# Patient Record
Sex: Female | Born: 1950 | Race: Black or African American | Hispanic: No | Marital: Single | Smoking: Never smoker
Health system: Southern US, Community
[De-identification: ages and names within clinical notes are randomized; demographics above are authoritative.]

## PROBLEM LIST (undated history)

## (undated) DIAGNOSIS — K5090 Crohn's disease, unspecified, without complications: Secondary | ICD-10-CM

## (undated) DIAGNOSIS — K648 Other hemorrhoids: Secondary | ICD-10-CM

## (undated) DIAGNOSIS — I10 Essential (primary) hypertension: Secondary | ICD-10-CM

## (undated) DIAGNOSIS — K210 Gastro-esophageal reflux disease with esophagitis: Secondary | ICD-10-CM

## (undated) DIAGNOSIS — R011 Cardiac murmur, unspecified: Secondary | ICD-10-CM

## (undated) DIAGNOSIS — D126 Benign neoplasm of colon, unspecified: Secondary | ICD-10-CM

## (undated) DIAGNOSIS — G43909 Migraine, unspecified, not intractable, without status migrainosus: Secondary | ICD-10-CM

## (undated) DIAGNOSIS — D649 Anemia, unspecified: Secondary | ICD-10-CM

## (undated) DIAGNOSIS — D259 Leiomyoma of uterus, unspecified: Secondary | ICD-10-CM

## (undated) DIAGNOSIS — IMO0002 Reserved for concepts with insufficient information to code with codable children: Secondary | ICD-10-CM

## (undated) DIAGNOSIS — K635 Polyp of colon: Secondary | ICD-10-CM

## (undated) DIAGNOSIS — K621 Rectal polyp: Secondary | ICD-10-CM

## (undated) DIAGNOSIS — K219 Gastro-esophageal reflux disease without esophagitis: Secondary | ICD-10-CM

## (undated) DIAGNOSIS — E049 Nontoxic goiter, unspecified: Secondary | ICD-10-CM

## (undated) DIAGNOSIS — K222 Esophageal obstruction: Secondary | ICD-10-CM

## (undated) DIAGNOSIS — K449 Diaphragmatic hernia without obstruction or gangrene: Secondary | ICD-10-CM

## (undated) DIAGNOSIS — T7840XA Allergy, unspecified, initial encounter: Secondary | ICD-10-CM

## (undated) DIAGNOSIS — K2980 Duodenitis without bleeding: Secondary | ICD-10-CM

## (undated) HISTORY — DX: Gastro-esophageal reflux disease with esophagitis: K21.0

## (undated) HISTORY — DX: Anemia, unspecified: D64.9

## (undated) HISTORY — PX: UPPER GASTROINTESTINAL ENDOSCOPY: SHX188

## (undated) HISTORY — PX: CATARACT EXTRACTION: SUR2

## (undated) HISTORY — PX: OVARIAN CYST REMOVAL: SHX89

## (undated) HISTORY — DX: Reserved for concepts with insufficient information to code with codable children: IMO0002

## (undated) HISTORY — DX: Diaphragmatic hernia without obstruction or gangrene: K44.9

## (undated) HISTORY — DX: Essential (primary) hypertension: I10

## (undated) HISTORY — DX: Crohn's disease, unspecified, without complications: K50.90

## (undated) HISTORY — DX: Migraine, unspecified, not intractable, without status migrainosus: G43.909

## (undated) HISTORY — DX: Allergy, unspecified, initial encounter: T78.40XA

## (undated) HISTORY — PX: COLONOSCOPY: SHX174

## (undated) HISTORY — DX: Leiomyoma of uterus, unspecified: D25.9

## (undated) HISTORY — DX: Cardiac murmur, unspecified: R01.1

## (undated) HISTORY — DX: Nontoxic goiter, unspecified: E04.9

## (undated) HISTORY — DX: Other hemorrhoids: K64.8

## (undated) HISTORY — PX: HEMORRHOID SURGERY: SHX153

## (undated) HISTORY — DX: Rectal polyp: K62.1

## (undated) HISTORY — DX: Duodenitis without bleeding: K29.80

## (undated) HISTORY — DX: Polyp of colon: K63.5

## (undated) HISTORY — DX: Gastro-esophageal reflux disease without esophagitis: K21.9

## (undated) HISTORY — DX: Benign neoplasm of colon, unspecified: D12.6

## (undated) HISTORY — PX: POLYPECTOMY: SHX149

## (undated) HISTORY — DX: Esophageal obstruction: K22.2

---

## 1998-06-17 ENCOUNTER — Encounter: Payer: Self-pay | Admitting: Internal Medicine

## 1998-06-17 ENCOUNTER — Ambulatory Visit (HOSPITAL_COMMUNITY): Admission: RE | Admit: 1998-06-17 | Discharge: 1998-06-17 | Payer: Self-pay | Admitting: Internal Medicine

## 1998-07-21 ENCOUNTER — Other Ambulatory Visit: Admission: RE | Admit: 1998-07-21 | Discharge: 1998-07-21 | Payer: Self-pay | Admitting: Obstetrics and Gynecology

## 1999-05-07 ENCOUNTER — Other Ambulatory Visit: Admission: RE | Admit: 1999-05-07 | Discharge: 1999-05-07 | Payer: Self-pay | Admitting: Obstetrics and Gynecology

## 1999-11-10 ENCOUNTER — Other Ambulatory Visit: Admission: RE | Admit: 1999-11-10 | Discharge: 1999-11-10 | Payer: Self-pay | Admitting: Obstetrics and Gynecology

## 2000-04-27 ENCOUNTER — Other Ambulatory Visit: Admission: RE | Admit: 2000-04-27 | Discharge: 2000-04-27 | Payer: Self-pay | Admitting: Obstetrics and Gynecology

## 2000-05-17 DIAGNOSIS — K449 Diaphragmatic hernia without obstruction or gangrene: Secondary | ICD-10-CM

## 2000-05-17 HISTORY — DX: Diaphragmatic hernia without obstruction or gangrene: K44.9

## 2000-10-31 ENCOUNTER — Other Ambulatory Visit: Admission: RE | Admit: 2000-10-31 | Discharge: 2000-10-31 | Payer: Self-pay | Admitting: Obstetrics and Gynecology

## 2001-03-01 ENCOUNTER — Encounter (INDEPENDENT_AMBULATORY_CARE_PROVIDER_SITE_OTHER): Payer: Self-pay | Admitting: Specialist

## 2001-03-01 ENCOUNTER — Other Ambulatory Visit: Admission: RE | Admit: 2001-03-01 | Discharge: 2001-03-01 | Payer: Self-pay | Admitting: Gastroenterology

## 2001-10-17 ENCOUNTER — Other Ambulatory Visit: Admission: RE | Admit: 2001-10-17 | Discharge: 2001-10-17 | Payer: Self-pay | Admitting: Obstetrics and Gynecology

## 2002-10-25 ENCOUNTER — Other Ambulatory Visit: Admission: RE | Admit: 2002-10-25 | Discharge: 2002-10-25 | Payer: Self-pay | Admitting: Obstetrics and Gynecology

## 2003-06-18 ENCOUNTER — Ambulatory Visit (HOSPITAL_COMMUNITY): Admission: RE | Admit: 2003-06-18 | Discharge: 2003-06-18 | Payer: Self-pay | Admitting: Endocrinology

## 2003-07-02 ENCOUNTER — Encounter (INDEPENDENT_AMBULATORY_CARE_PROVIDER_SITE_OTHER): Payer: Self-pay | Admitting: Specialist

## 2003-07-02 ENCOUNTER — Ambulatory Visit (HOSPITAL_COMMUNITY): Admission: RE | Admit: 2003-07-02 | Discharge: 2003-07-02 | Payer: Self-pay | Admitting: Endocrinology

## 2003-11-07 ENCOUNTER — Other Ambulatory Visit: Admission: RE | Admit: 2003-11-07 | Discharge: 2003-11-07 | Payer: Self-pay | Admitting: Obstetrics and Gynecology

## 2004-05-20 ENCOUNTER — Ambulatory Visit: Payer: Self-pay | Admitting: Internal Medicine

## 2004-05-28 ENCOUNTER — Ambulatory Visit: Payer: Self-pay | Admitting: Internal Medicine

## 2004-06-30 ENCOUNTER — Ambulatory Visit (HOSPITAL_COMMUNITY): Admission: RE | Admit: 2004-06-30 | Discharge: 2004-06-30 | Payer: Self-pay | Admitting: General Surgery

## 2004-08-05 ENCOUNTER — Ambulatory Visit (HOSPITAL_COMMUNITY): Admission: RE | Admit: 2004-08-05 | Discharge: 2004-08-05 | Payer: Self-pay | Admitting: Obstetrics and Gynecology

## 2004-08-05 ENCOUNTER — Encounter (INDEPENDENT_AMBULATORY_CARE_PROVIDER_SITE_OTHER): Payer: Self-pay | Admitting: *Deleted

## 2004-08-05 HISTORY — PX: OPERATIVE HYSTEROSCOPY: SUR664

## 2004-08-07 ENCOUNTER — Ambulatory Visit: Payer: Self-pay | Admitting: Internal Medicine

## 2004-10-30 ENCOUNTER — Ambulatory Visit: Payer: Self-pay | Admitting: Gastroenterology

## 2005-04-29 ENCOUNTER — Ambulatory Visit: Payer: Self-pay | Admitting: Internal Medicine

## 2005-05-17 DIAGNOSIS — K2980 Duodenitis without bleeding: Secondary | ICD-10-CM

## 2005-05-17 DIAGNOSIS — K2100 Gastro-esophageal reflux disease with esophagitis, without bleeding: Secondary | ICD-10-CM

## 2005-05-17 DIAGNOSIS — K222 Esophageal obstruction: Secondary | ICD-10-CM

## 2005-05-17 HISTORY — DX: Duodenitis without bleeding: K29.80

## 2005-05-17 HISTORY — DX: Esophageal obstruction: K22.2

## 2005-05-17 HISTORY — DX: Gastro-esophageal reflux disease with esophagitis, without bleeding: K21.00

## 2005-07-01 ENCOUNTER — Encounter: Admission: RE | Admit: 2005-07-01 | Discharge: 2005-07-01 | Payer: Self-pay | Admitting: General Surgery

## 2005-07-21 ENCOUNTER — Ambulatory Visit: Payer: Self-pay | Admitting: Internal Medicine

## 2005-07-24 ENCOUNTER — Ambulatory Visit: Payer: Self-pay | Admitting: Family Medicine

## 2005-09-06 ENCOUNTER — Ambulatory Visit: Payer: Self-pay | Admitting: Internal Medicine

## 2005-09-08 ENCOUNTER — Encounter: Admission: RE | Admit: 2005-09-08 | Discharge: 2005-09-08 | Payer: Self-pay | Admitting: Internal Medicine

## 2005-09-21 ENCOUNTER — Encounter: Admission: RE | Admit: 2005-09-21 | Discharge: 2005-10-15 | Payer: Self-pay | Admitting: Internal Medicine

## 2005-12-14 ENCOUNTER — Ambulatory Visit: Payer: Self-pay | Admitting: Internal Medicine

## 2006-01-12 ENCOUNTER — Ambulatory Visit: Payer: Self-pay | Admitting: Internal Medicine

## 2006-02-03 ENCOUNTER — Ambulatory Visit: Payer: Self-pay | Admitting: Gastroenterology

## 2006-02-16 ENCOUNTER — Ambulatory Visit: Payer: Self-pay | Admitting: Internal Medicine

## 2006-03-04 ENCOUNTER — Ambulatory Visit: Payer: Self-pay | Admitting: Internal Medicine

## 2006-03-09 ENCOUNTER — Encounter (INDEPENDENT_AMBULATORY_CARE_PROVIDER_SITE_OTHER): Payer: Self-pay | Admitting: *Deleted

## 2006-03-09 ENCOUNTER — Ambulatory Visit: Payer: Self-pay | Admitting: Gastroenterology

## 2006-04-12 ENCOUNTER — Ambulatory Visit: Payer: Self-pay | Admitting: Gastroenterology

## 2006-05-24 ENCOUNTER — Ambulatory Visit: Payer: Self-pay | Admitting: Gastroenterology

## 2006-05-25 ENCOUNTER — Ambulatory Visit (HOSPITAL_COMMUNITY): Admission: RE | Admit: 2006-05-25 | Discharge: 2006-05-25 | Payer: Self-pay | Admitting: Gastroenterology

## 2006-06-07 ENCOUNTER — Ambulatory Visit: Payer: Self-pay | Admitting: Internal Medicine

## 2006-06-07 LAB — CONVERTED CEMR LAB
BUN: 8 mg/dL (ref 6–23)
Creatinine, Ser: 1 mg/dL (ref 0.4–1.2)
Hgb A1c MFr Bld: 5.8 % (ref 4.6–6.0)

## 2006-06-30 ENCOUNTER — Ambulatory Visit: Payer: Self-pay | Admitting: Internal Medicine

## 2006-07-03 DIAGNOSIS — K219 Gastro-esophageal reflux disease without esophagitis: Secondary | ICD-10-CM | POA: Insufficient documentation

## 2006-07-03 DIAGNOSIS — D649 Anemia, unspecified: Secondary | ICD-10-CM | POA: Insufficient documentation

## 2006-07-21 ENCOUNTER — Ambulatory Visit: Payer: Self-pay | Admitting: Gastroenterology

## 2006-08-16 ENCOUNTER — Ambulatory Visit: Payer: Self-pay | Admitting: Internal Medicine

## 2006-08-23 ENCOUNTER — Ambulatory Visit: Payer: Self-pay | Admitting: Gastroenterology

## 2006-10-31 ENCOUNTER — Encounter: Payer: Self-pay | Admitting: Internal Medicine

## 2006-12-30 ENCOUNTER — Ambulatory Visit (HOSPITAL_COMMUNITY): Admission: RE | Admit: 2006-12-30 | Discharge: 2006-12-30 | Payer: Self-pay | Admitting: Dermatology

## 2007-01-03 ENCOUNTER — Encounter: Payer: Self-pay | Admitting: Internal Medicine

## 2007-01-05 ENCOUNTER — Ambulatory Visit: Payer: Self-pay | Admitting: Internal Medicine

## 2007-01-05 ENCOUNTER — Telehealth (INDEPENDENT_AMBULATORY_CARE_PROVIDER_SITE_OTHER): Payer: Self-pay | Admitting: *Deleted

## 2007-01-10 ENCOUNTER — Encounter: Payer: Self-pay | Admitting: Internal Medicine

## 2007-01-12 ENCOUNTER — Encounter (INDEPENDENT_AMBULATORY_CARE_PROVIDER_SITE_OTHER): Payer: Self-pay | Admitting: *Deleted

## 2007-01-17 ENCOUNTER — Encounter (INDEPENDENT_AMBULATORY_CARE_PROVIDER_SITE_OTHER): Payer: Self-pay | Admitting: *Deleted

## 2007-01-27 ENCOUNTER — Encounter: Payer: Self-pay | Admitting: Internal Medicine

## 2007-03-14 ENCOUNTER — Encounter: Payer: Self-pay | Admitting: Internal Medicine

## 2007-05-26 ENCOUNTER — Ambulatory Visit: Payer: Self-pay | Admitting: Gastroenterology

## 2007-05-26 LAB — CONVERTED CEMR LAB
Eosinophils Relative: 1.1 % (ref 0.0–5.0)
Ferritin: 31.2 ng/mL (ref 10.0–291.0)
Iron: 49 ug/dL (ref 42–145)
Lymphocytes Relative: 19.7 % (ref 12.0–46.0)
MCHC: 34.1 g/dL (ref 30.0–36.0)
MCV: 80.2 fL (ref 78.0–100.0)
Monocytes Absolute: 1.4 10*3/uL — ABNORMAL HIGH (ref 0.2–0.7)
Neutro Abs: 10 10*3/uL — ABNORMAL HIGH (ref 1.4–7.7)
Neutrophils Relative %: 69 % (ref 43.0–77.0)
Saturation Ratios: 14 % — ABNORMAL LOW (ref 20.0–50.0)
Transferrin: 250.6 mg/dL (ref >=212.0)

## 2007-09-25 ENCOUNTER — Telehealth: Payer: Self-pay | Admitting: Gastroenterology

## 2007-09-26 ENCOUNTER — Telehealth: Payer: Self-pay | Admitting: Gastroenterology

## 2007-11-06 ENCOUNTER — Telehealth: Payer: Self-pay | Admitting: Gastroenterology

## 2007-11-13 ENCOUNTER — Ambulatory Visit: Payer: Self-pay | Admitting: Internal Medicine

## 2007-11-18 LAB — CONVERTED CEMR LAB
Eosinophils Relative: 1.7 % (ref 0.0–5.0)
Free T4: 0.8 ng/dL (ref 0.6–1.6)
MCV: 81.1 fL (ref 78.0–100.0)
Monocytes Relative: 13.2 % — ABNORMAL HIGH (ref 3.0–12.0)
TSH: 0.34 microintl units/mL — ABNORMAL LOW (ref 0.35–5.50)

## 2007-11-22 ENCOUNTER — Encounter (INDEPENDENT_AMBULATORY_CARE_PROVIDER_SITE_OTHER): Payer: Self-pay | Admitting: *Deleted

## 2007-12-29 ENCOUNTER — Encounter (INDEPENDENT_AMBULATORY_CARE_PROVIDER_SITE_OTHER): Payer: Self-pay | Admitting: *Deleted

## 2007-12-29 ENCOUNTER — Ambulatory Visit: Payer: Self-pay | Admitting: Internal Medicine

## 2007-12-29 DIAGNOSIS — Z86018 Personal history of other benign neoplasm: Secondary | ICD-10-CM | POA: Insufficient documentation

## 2007-12-29 DIAGNOSIS — G5600 Carpal tunnel syndrome, unspecified upper limb: Secondary | ICD-10-CM

## 2007-12-29 DIAGNOSIS — I10 Essential (primary) hypertension: Secondary | ICD-10-CM | POA: Insufficient documentation

## 2007-12-29 DIAGNOSIS — G5601 Carpal tunnel syndrome, right upper limb: Secondary | ICD-10-CM | POA: Insufficient documentation

## 2007-12-29 DIAGNOSIS — E049 Nontoxic goiter, unspecified: Secondary | ICD-10-CM | POA: Insufficient documentation

## 2008-01-01 ENCOUNTER — Encounter (INDEPENDENT_AMBULATORY_CARE_PROVIDER_SITE_OTHER): Payer: Self-pay | Admitting: *Deleted

## 2008-01-15 ENCOUNTER — Encounter: Payer: Self-pay | Admitting: Internal Medicine

## 2008-01-24 ENCOUNTER — Encounter: Payer: Self-pay | Admitting: Internal Medicine

## 2008-01-30 ENCOUNTER — Encounter: Admission: RE | Admit: 2008-01-30 | Discharge: 2008-01-30 | Payer: Self-pay | Admitting: Surgery

## 2008-02-08 ENCOUNTER — Encounter: Payer: Self-pay | Admitting: Internal Medicine

## 2008-03-13 ENCOUNTER — Telehealth: Payer: Self-pay | Admitting: Gastroenterology

## 2008-03-14 ENCOUNTER — Ambulatory Visit: Payer: Self-pay | Admitting: Family Medicine

## 2008-03-14 DIAGNOSIS — J309 Allergic rhinitis, unspecified: Secondary | ICD-10-CM | POA: Insufficient documentation

## 2008-04-17 ENCOUNTER — Telehealth (INDEPENDENT_AMBULATORY_CARE_PROVIDER_SITE_OTHER): Payer: Self-pay | Admitting: *Deleted

## 2008-04-24 DIAGNOSIS — K2100 Gastro-esophageal reflux disease with esophagitis, without bleeding: Secondary | ICD-10-CM | POA: Insufficient documentation

## 2008-04-24 DIAGNOSIS — K449 Diaphragmatic hernia without obstruction or gangrene: Secondary | ICD-10-CM | POA: Insufficient documentation

## 2008-04-25 ENCOUNTER — Ambulatory Visit: Payer: Self-pay | Admitting: Gastroenterology

## 2008-04-25 DIAGNOSIS — K589 Irritable bowel syndrome without diarrhea: Secondary | ICD-10-CM | POA: Insufficient documentation

## 2008-04-25 LAB — CONVERTED CEMR LAB
AST: 18 units/L (ref 0–37)
Albumin: 3.7 g/dL (ref 3.5–5.2)
Alkaline Phosphatase: 90 units/L (ref 39–117)
Basophils Relative: 2.8 % (ref 0.0–3.0)
Bilirubin, Direct: 0.1 mg/dL (ref 0.0–0.3)
CO2: 32 meq/L (ref 19–32)
Calcium: 9.8 mg/dL (ref 8.4–10.5)
Eosinophils Relative: 1.2 % (ref 0.0–5.0)
GFR calc Af Amer: 96 mL/min
GFR calc non Af Amer: 79 mL/min
HCT: 39.1 % (ref 36.0–46.0)
Hemoglobin: 12.9 g/dL (ref 12.0–15.0)
MCV: 80.9 fL (ref 78.0–100.0)
Platelets: 163 10*3/uL (ref 150–400)
Sodium: 141 meq/L (ref 135–145)
TSH: 0.46 microintl units/mL (ref 0.35–5.50)
Total Protein: 7.2 g/dL (ref 6.0–8.3)
WBC: 7.7 10*3/uL (ref 4.5–10.5)

## 2008-05-06 ENCOUNTER — Telehealth: Payer: Self-pay | Admitting: Gastroenterology

## 2008-05-14 ENCOUNTER — Telehealth: Payer: Self-pay | Admitting: Gastroenterology

## 2008-05-27 ENCOUNTER — Encounter: Payer: Self-pay | Admitting: Gastroenterology

## 2008-06-03 ENCOUNTER — Telehealth: Payer: Self-pay | Admitting: Gastroenterology

## 2008-06-06 ENCOUNTER — Ambulatory Visit: Payer: Self-pay | Admitting: Gastroenterology

## 2008-08-12 ENCOUNTER — Telehealth (INDEPENDENT_AMBULATORY_CARE_PROVIDER_SITE_OTHER): Payer: Self-pay | Admitting: *Deleted

## 2008-08-15 ENCOUNTER — Telehealth: Payer: Self-pay | Admitting: Gastroenterology

## 2008-12-31 ENCOUNTER — Ambulatory Visit: Payer: Self-pay | Admitting: Internal Medicine

## 2008-12-31 DIAGNOSIS — R739 Hyperglycemia, unspecified: Secondary | ICD-10-CM | POA: Insufficient documentation

## 2009-01-06 ENCOUNTER — Encounter (INDEPENDENT_AMBULATORY_CARE_PROVIDER_SITE_OTHER): Payer: Self-pay | Admitting: *Deleted

## 2009-01-24 ENCOUNTER — Telehealth (INDEPENDENT_AMBULATORY_CARE_PROVIDER_SITE_OTHER): Payer: Self-pay | Admitting: *Deleted

## 2009-01-31 ENCOUNTER — Encounter: Payer: Self-pay | Admitting: Internal Medicine

## 2009-03-28 ENCOUNTER — Telehealth (INDEPENDENT_AMBULATORY_CARE_PROVIDER_SITE_OTHER): Payer: Self-pay | Admitting: *Deleted

## 2009-08-08 ENCOUNTER — Ambulatory Visit: Payer: Self-pay | Admitting: Internal Medicine

## 2009-09-23 ENCOUNTER — Ambulatory Visit: Payer: Self-pay | Admitting: Family

## 2009-10-27 ENCOUNTER — Telehealth: Payer: Self-pay | Admitting: Gastroenterology

## 2009-10-27 ENCOUNTER — Ambulatory Visit: Payer: Self-pay | Admitting: Internal Medicine

## 2009-10-27 ENCOUNTER — Telehealth (INDEPENDENT_AMBULATORY_CARE_PROVIDER_SITE_OTHER): Payer: Self-pay | Admitting: *Deleted

## 2009-10-27 DIAGNOSIS — R197 Diarrhea, unspecified: Secondary | ICD-10-CM | POA: Insufficient documentation

## 2009-10-29 ENCOUNTER — Encounter: Payer: Self-pay | Admitting: Physician Assistant

## 2009-10-29 LAB — CONVERTED CEMR LAB
Basophils Relative: 0.2 % (ref 0.0–3.0)
CO2: 32 meq/L (ref 19–32)
CRP, High Sensitivity: 3.56 (ref 0.00–5.00)
Creatinine, Ser: 0.7 mg/dL (ref 0.4–1.2)
Eosinophils Absolute: 0.1 10*3/uL (ref 0.0–0.7)
Lymphocytes Relative: 22.9 % (ref 12.0–46.0)
Lymphs Abs: 1.8 10*3/uL (ref 0.7–4.0)
MCV: 80.7 fL (ref 78.0–100.0)
Monocytes Absolute: 0.7 10*3/uL (ref 0.1–1.0)
Neutro Abs: 5.1 10*3/uL (ref 1.4–7.7)
Neutrophils Relative %: 66.5 % (ref 43.0–77.0)
RDW: 13.4 % (ref 11.5–14.6)

## 2009-11-27 ENCOUNTER — Ambulatory Visit: Payer: Self-pay | Admitting: Internal Medicine

## 2009-11-27 DIAGNOSIS — R259 Unspecified abnormal involuntary movements: Secondary | ICD-10-CM | POA: Insufficient documentation

## 2009-11-27 DIAGNOSIS — R252 Cramp and spasm: Secondary | ICD-10-CM | POA: Insufficient documentation

## 2009-11-28 ENCOUNTER — Telehealth (INDEPENDENT_AMBULATORY_CARE_PROVIDER_SITE_OTHER): Payer: Self-pay | Admitting: *Deleted

## 2009-11-28 ENCOUNTER — Ambulatory Visit: Payer: Self-pay | Admitting: Gastroenterology

## 2009-12-01 LAB — CONVERTED CEMR LAB
Calcium: 10.2 mg/dL (ref 8.4–10.5)
Ferritin: 35.8 ng/mL (ref 10.0–291.0)
Iron: 77 ug/dL (ref 42–145)
Magnesium: 2.4 mg/dL (ref 1.5–2.5)
Vit D, 25-Hydroxy: 29 ng/mL — ABNORMAL LOW (ref 30–89)

## 2009-12-02 ENCOUNTER — Telehealth: Payer: Self-pay | Admitting: Gastroenterology

## 2009-12-08 ENCOUNTER — Telehealth: Payer: Self-pay | Admitting: Gastroenterology

## 2009-12-16 ENCOUNTER — Telehealth: Payer: Self-pay | Admitting: Gastroenterology

## 2009-12-30 ENCOUNTER — Ambulatory Visit: Payer: Self-pay | Admitting: Gastroenterology

## 2010-02-23 ENCOUNTER — Encounter: Payer: Self-pay | Admitting: Internal Medicine

## 2010-03-26 ENCOUNTER — Encounter: Payer: Self-pay | Admitting: Internal Medicine

## 2010-03-26 ENCOUNTER — Ambulatory Visit: Payer: Self-pay | Admitting: Internal Medicine

## 2010-03-26 DIAGNOSIS — S99929A Unspecified injury of unspecified foot, initial encounter: Secondary | ICD-10-CM

## 2010-03-26 DIAGNOSIS — E559 Vitamin D deficiency, unspecified: Secondary | ICD-10-CM | POA: Insufficient documentation

## 2010-03-26 DIAGNOSIS — S99919A Unspecified injury of unspecified ankle, initial encounter: Secondary | ICD-10-CM

## 2010-03-26 DIAGNOSIS — S8990XA Unspecified injury of unspecified lower leg, initial encounter: Secondary | ICD-10-CM | POA: Insufficient documentation

## 2010-03-27 LAB — CONVERTED CEMR LAB
ALT: 14 units/L (ref 0–35)
AST: 20 units/L (ref 0–37)
Albumin: 3.6 g/dL (ref 3.5–5.2)
Basophils Absolute: 0 10*3/uL (ref 0.0–0.1)
CO2: 27 meq/L (ref 19–32)
Calcium: 9.8 mg/dL (ref 8.4–10.5)
Creatinine, Ser: 0.7 mg/dL (ref 0.4–1.2)
Eosinophils Absolute: 0.1 10*3/uL (ref 0.0–0.7)
Glucose, Bld: 64 mg/dL — ABNORMAL LOW (ref 70–99)
HDL: 75.1 mg/dL (ref >39.00)
Hemoglobin: 12.6 g/dL (ref 12.0–15.0)
Lymphocytes Relative: 29.9 % (ref 12.0–46.0)
Lymphs Abs: 1.9 10*3/uL (ref 0.7–4.0)
MCHC: 33.4 g/dL (ref 30.0–36.0)
Monocytes Absolute: 0.5 10*3/uL (ref 0.1–1.0)
Monocytes Relative: 7.4 % (ref 3.0–12.0)
Neutro Abs: 3.7 10*3/uL (ref 1.4–7.7)
Neutrophils Relative %: 60.1 % (ref 43.0–77.0)
Platelets: 212 10*3/uL (ref 150.0–400.0)
Potassium: 4.2 meq/L (ref 3.5–5.1)
Sodium: 142 meq/L (ref 135–145)
Total Bilirubin: 0.5 mg/dL (ref 0.3–1.2)
Total CHOL/HDL Ratio: 3
Triglycerides: 89 mg/dL (ref 0.0–149.0)
Vit D, 25-Hydroxy: 41 ng/mL (ref 30–89)

## 2010-04-14 ENCOUNTER — Telehealth: Payer: Self-pay | Admitting: Gastroenterology

## 2010-04-15 ENCOUNTER — Telehealth: Payer: Self-pay | Admitting: Gastroenterology

## 2010-04-20 ENCOUNTER — Encounter: Payer: Self-pay | Admitting: Gastroenterology

## 2010-04-23 ENCOUNTER — Telehealth: Payer: Self-pay | Admitting: Gastroenterology

## 2010-04-24 ENCOUNTER — Encounter: Payer: Self-pay | Admitting: Internal Medicine

## 2010-04-24 ENCOUNTER — Telehealth (INDEPENDENT_AMBULATORY_CARE_PROVIDER_SITE_OTHER): Payer: Self-pay | Admitting: *Deleted

## 2010-05-01 ENCOUNTER — Ambulatory Visit: Payer: Self-pay | Admitting: Gastroenterology

## 2010-05-01 DIAGNOSIS — R1013 Epigastric pain: Secondary | ICD-10-CM | POA: Insufficient documentation

## 2010-05-01 DIAGNOSIS — R109 Unspecified abdominal pain: Secondary | ICD-10-CM | POA: Insufficient documentation

## 2010-05-04 ENCOUNTER — Telehealth: Payer: Self-pay | Admitting: Gastroenterology

## 2010-05-04 ENCOUNTER — Ambulatory Visit (HOSPITAL_COMMUNITY)
Admission: RE | Admit: 2010-05-04 | Discharge: 2010-05-04 | Payer: Self-pay | Source: Home / Self Care | Attending: Gastroenterology | Admitting: Gastroenterology

## 2010-05-06 ENCOUNTER — Telehealth: Payer: Self-pay | Admitting: Gastroenterology

## 2010-05-13 ENCOUNTER — Telehealth (INDEPENDENT_AMBULATORY_CARE_PROVIDER_SITE_OTHER): Payer: Self-pay | Admitting: *Deleted

## 2010-05-27 ENCOUNTER — Ambulatory Visit: Admit: 2010-05-27 | Payer: Self-pay | Admitting: Gastroenterology

## 2010-06-06 ENCOUNTER — Encounter: Payer: Self-pay | Admitting: Endocrinology

## 2010-06-08 ENCOUNTER — Encounter: Payer: Self-pay | Admitting: Surgery

## 2010-06-14 LAB — CONVERTED CEMR LAB
ALT: 15 units/L (ref 0–35)
ALT: 15 units/L (ref 0–35)
ALT: 18 units/L (ref 0–35)
AST: 17 units/L (ref 0–37)
AST: 19 units/L (ref 0–37)
Albumin: 3.8 g/dL (ref 3.5–5.2)
Alkaline Phosphatase: 89 units/L (ref 39–117)
BUN: 10 mg/dL (ref 6–23)
BUN: 10 mg/dL (ref 6–23)
Basophils Relative: 0 % (ref 0.0–3.0)
Bilirubin, Direct: 0.1 mg/dL (ref 0.0–0.3)
CO2: 33 meq/L — ABNORMAL HIGH (ref 19–32)
Calcium: 9.5 mg/dL (ref 8.4–10.5)
Calcium: 9.9 mg/dL (ref 8.4–10.5)
Chloride: 105 meq/L (ref 96–112)
Creatinine, Ser: 0.6 mg/dL (ref 0.4–1.2)
Creatinine, Ser: 0.7 mg/dL (ref 0.4–1.2)
Direct LDL: 128.1 mg/dL
Eosinophils Absolute: 0.1 10*3/uL (ref 0.0–0.7)
Eosinophils Relative: 1.4 % (ref 0.0–5.0)
Eosinophils Relative: 1.4 % (ref 0.0–5.0)
Free T4: 0.9 ng/dL (ref 0.6–1.6)
GFR calc non Af Amer: 132.95 mL/min (ref >60)
HCT: 41.6 % (ref 36.0–46.0)
HDL: 72.7 mg/dL (ref >39.0)
Hemoglobin: 13.8 g/dL (ref 12.0–15.0)
Hgb A1c MFr Bld: 5.6 % (ref 4.6–6.0)
Hgb A1c MFr Bld: 5.7 % (ref 4.6–6.5)
Lymphocytes Relative: 29.1 % (ref 12.0–46.0)
Lymphocytes Relative: 31.5 % (ref 12.0–46.0)
Monocytes Absolute: 0.8 10*3/uL (ref 0.1–1.0)
Monocytes Relative: 7.8 % (ref 3.0–12.0)
Monocytes Relative: 8.3 % (ref 3.0–12.0)
Neutrophils Relative %: 61.2 % (ref 43.0–77.0)
Platelets: 154 10*3/uL (ref 150.0–400.0)
RDW: 12.6 % (ref 11.5–14.6)
Sodium: 141 meq/L (ref 135–145)
T3, Free: 2.9 pg/mL (ref 2.3–4.2)
TSH: 0.46 microintl units/mL (ref 0.35–5.50)
TSH: 0.61 microintl units/mL (ref 0.35–5.50)
Total Bilirubin: 0.8 mg/dL (ref 0.3–1.2)
Total Protein: 7.2 g/dL (ref 6.0–8.3)
Triglycerides: 90 mg/dL (ref 0.0–149.0)
WBC: 7.8 10*3/uL (ref 4.5–10.5)

## 2010-06-18 NOTE — Progress Notes (Signed)
 ----   Converted from flag ---- ---- 05/13/2010 1:17 PM, Tawni Levy wrote: Patient returned your call and wants to speak to yoou because she wants to know for how long does she needs to take Lialda ------------------------------  Lmom for pt to call back. Graciella Freer, RN 05/13/10 @ 1459  Lmom for patient to return my call- have attempted several times since her original call on 05/13/10. Graciella Freer, RN 05/19/10 @ 1400  Spoke with patient this am after trying to reach her for several days. I had spoken to her about her U/S during Christmas and then she called about the Lialda. Patient reported Dr [name] gave her samples on 05/01/10 visit and she has stopped the drug; she reports she couldn/t tell if Lialda did her any good. Patient was also ordered Librax which she hasn't used. Today, patient reports some abdominal pain and was instructed to try the Librax q6-8hr when needed. Patient will continue on Aciphex.  Patient will call for further problems. Graciella Freer, RN, 05/21/10 @ 1100am

## 2010-06-18 NOTE — Assessment & Plan Note (Signed)
Summary: cpx/fasting//kn   Vital Signs:  Patient profile:   60 year old female Menstrual status:  postmenopausal Height:      63 inches Weight:      150.2 pounds BMI:     26.70 Temp:     98.4 degrees F oral Pulse rate:   72 / minute Resp:     14 per minute BP sitting:   124 / 80  (left arm) Cuff size:   regular  Vitals Entered By: Shonna Chock CMA (March 26, 2010 10:01 AM)    Primary Care Provider:  Marga Melnick, MD   History of Present Illness:       Karen Evans is here for a physical; she recently injured her knee @ work last month . At this time the patient denies  pain ,swelling, redness, giving away, locking, popping, stiffness for >1 hr, decreased ROM, and weakness.  The pain is located in the left knee.  The pain began with a direct blow, she struck it on a piece of machinery.  Evaluation to date has included plain X-rays. Rx: Tylenol  & ice helped. Hypertension Follow-Up        The patient denies lightheadedness, urinary frequency, headaches, edema, and fatigue.  The patient denies the following associated symptoms: chest pain, chest pressure, exercise intolerance, dyspnea, palpitations, and syncope.  Compliance with medications (by patient report) has been near 100%.  The patient reports that dietary compliance has been good.  The patient reports exercising 5 X per week.  Adjunctive measures currently used by the patient include salt restriction. BP not recorded @ home;however, it is checked @ work.   Current Medications (verified): 1)  Allegra Allergy 180 Mg Tabs (Fexofenadine Hcl) .Marland Kitchen.. 1 By Mouth Two Times A Day As Needed 2)  Benazepril Hcl 40 Mg  Tabs (Benazepril Hcl) .... Take One Tablet in The Morning 3)  Nasacort Aq 55 Mcg/act Aers (Triamcinolone Acetonide(Nasal)) .... 2 Sprays Each Nostril Daily 4)  Aciphex 20 Mg Tbec (Rabeprazole Sodium) .Marland Kitchen.. 1 Tablet By Mouth Once Daily 5)  Lactaid 3000 Unit Tabs (Lactase) .... Two Times A Day  Allergies: 1)  ! Pcn 2)  ! Sulfa 3)   ! Erythromycin 4)  ! 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         Bloating evaluated with CA125 ( 14.7, nl < 34) by Dr Cherly Hensen  Physical Exam  General:  well-nourished;alert,appropriate and cooperative throughout examination Head:  Normocephalic and atraumatic without obvious abnormalities. Eyes:  No corneal or conjunctival  inflammation noted. EOMI. Perrla. Funduscopic exam benign, without hemorrhages, exudates or papilledema. Vision grossly normal. Ears:  External ear exam shows no significant lesions or deformities.  Otoscopic examination reveals clear canals, tympanic membranes are intact bilaterally without bulging, retraction, inflammation or discharge. Hearing is grossly normal  bilaterally. Nose:  External nasal examination shows no deformity or inflammation. Nasal mucosa are pink and moist without lesions or exudates. Mouth:  Oral mucosa and oropharynx without lesions or exudates.  Teeth in good repair. Neck:  No deformities, masses, or tenderness noted. Thyroid slightly full Lungs:  Normal respiratory effort, chest expands symmetrically. Lungs are clear to auscultation, no crackles or wheezes. Heart:  Normal rate and regular rhythm. S1 and S2 normal without gallop, murmur, click, rub .S4 Abdomen:  Bowel sounds positive,abdomen soft and non-tender without masses, organomegaly or hernias noted. Genitalia:  Dr Cherly Hensen Msk:  No deformity or scoliosis noted of thoracic or lumbar spine.   Pulses:  R and L carotid,radial,dorsalis pedis and posterior tibial pulses are full and equal bilaterally Extremities:  No clubbing, cyanosis, edema, or deformity noted with normal full range of motion of all joints.  No L knee effusion or crepitus  Neurologic:  alert & oriented X3 and DTRs symmetrical and normal.   Skin:  Intact without suspicious lesions or rashes. Keratotic lesion R upper back Cervical Nodes:  No lymphadenopathy noted Axillary Nodes:  No palpable lymphadenopathy Psych:  memory intact for recent and remote, normally interactive, and good eye contact.     Impression & Recommendations:  Problem # 1:  ROUTINE GENERAL MEDICAL EXAM@HEALTH  CARE FACL (ICD-V70.0)  Orders: EKG w/ Interpretation (93000) Venipuncture (16109) TLB-Lipid Panel (80061-LIPID) TLB-BMP (Basic Metabolic Panel-BMET) (80048-METABOL) TLB-CBC Platelet - w/Differential (85025-CBCD) TLB-Hepatic/Liver Function Pnl (80076-HEPATIC) TLB-TSH (Thyroid Stimulating Hormone) (84443-TSH) T-Vitamin D (25-Hydroxy) (60454-09811) Specimen Handling (91478)  Problem # 2:  HYPERTENSION (ICD-401.9)  Her updated medication list for this problem includes:    Benazepril Hcl 40 Mg Tabs (Benazepril hcl) .Marland Kitchen... Take one  tablet in the morning  Problem # 3:  VITAMIN D DEFICIENCY (ICD-268.9)  Problem # 4:  CROHN'S DISEASE (ICD-555.9) as per Dr [name]  Problem # 5:  CARPAL TUNNEL SYNDROME, BILATERAL (ICD-354.0) wrist sprints effective by history  Problem # 6:  KNEE INJURY (ICD-959.7) no sequellae clinically  Complete Medication List: 1)  Allegra Allergy 180 Mg Tabs (Fexofenadine hcl) .Marland Kitchen.. 1 by mouth two times a day as needed 2)  Benazepril Hcl 40 Mg Tabs (Benazepril hcl) .... Take one tablet in the morning 3)  Aciphex 20 Mg Tbec (Rabeprazole sodium) .Marland Kitchen.. 1 tablet by mouth once daily 4)  Lactaid 3000 Unit Tabs (Lactase) .... Two times a day 5)  Fluticasone Propionate 50 Mcg/act Susp (Fluticasone propionate) .Marland Kitchen.. 1 spray two times a day as needed  Patient Instructions: 1)  It is important that you exercise regularly at least 20 minutes 5 times a week. If you develop chest pain, have severe difficulty breathing, or feel very tired , stop exercising immediately and seek medical attention. 2)  Check your Blood Pressure regularly. If it is above: 135/85 ON AVERAGE you should make an appointment. Prescriptions: FLUTICASONE PROPIONATE 50 MCG/ACT SUSP (FLUTICASONE PROPIONATE) 1 spray two times a day as needed  #1 x 11   Entered and Authorized by:   Marga Melnick MD   Signed by:   Marga Melnick MD on 03/26/2010   Method used:   Print then Give to Patient   RxID:   [id number]    Orders Added:  1)  Est. Patient 40-64 years [99396] 2)  EKG w/ Interpretation [93000] 3)  Venipuncture [36415] 4)  TLB-Lipid Panel [80061-LIPID] 5)  TLB-BMP (Basic Metabolic Panel-BMET) [80048-METABOL] 6)  TLB-CBC Platelet - w/Differential [85025-CBCD] 7)  TLB-Hepatic/Liver Function Pnl [80076-HEPATIC] 8)  TLB-TSH (Thyroid Stimulating Hormone) [84443-TSH] 9)  T-Vitamin D (25-Hydroxy) [29562-13086] 10)  Specimen Handling [99000]

## 2010-06-18 NOTE — Assessment & Plan Note (Signed)
Summary: FOR LEG CRAMPS--PH   Vital Signs:  Patient profile:   60 year old female Menstrual status:  postmenopausal Height:      63 inches (160.02 cm) Weight:      152.38 pounds (69.26 kg) BMI:     27.09 Temp:     98.2 degrees F (36.78 degrees C) oral Resp:     16 per minute BP sitting:   130 / 72  (left arm) Cuff size:   regular  Vitals Entered By: Lucious Groves CMA (November 27, 2009 12:40 PM) CC: C/O leg cramps, started today, and are worse when lying down./kb Is Patient Diabetic? No Pain Assessment Patient in pain? no      Comments Patient denies any pain right now./kb   Primary Care Provider:  Marga Melnick, MD  CC:  C/O leg cramps, started today, and and are worse when lying down./kb.  History of Present Illness: Cramping in feet & calves just today but drawing of big & 2nd  toes laterally intermittently for 6 months(?) when supine for a period of time.Not on diuretic;she is on Entocort. There is  no PMH of electrolye imbalance.  Current Medications (verified): 1)  Allegra 60 Mg Tabs (Fexofenadine Hcl) .Marland Kitchen.. 1 By Mouth Two Times A Day 2)  Benazepril Hcl 40 Mg  Tabs (Benazepril Hcl) .... Take One Tablet in The Morning 3)  Nasacort Aq 55 Mcg/act Aers (Triamcinolone Acetonide(Nasal)) .... 2 Sprays Each Nostril Daily 4)  Aciphex 20 Mg Tbec (Rabeprazole Sodium) .Marland Kitchen.. 1 Tablet By Mouth Once Daily 5)  Entocort Ec 3 Mg Xr24h-Cap (Budesonide) .... Take 3 Capsules Once Daily  Allergies (verified): 1)  ! Pcn 2)  ! Sulfa 3)  ! Erythromycin 4)  ! * Cefzil 5)  Nexium (Esomeprazole Magnesium)  Review of Systems CV:  Denies swelling of feet and swelling of hands. MS:  Denies joint pain, joint redness, joint swelling, and muscle weakness. Derm:  Denies lesion(s) and rash.  Physical Exam  General:  in no acute distress; alert,appropriate and cooperative throughout examination Pulses:  R and L dorsalis pedis and posterior tibial pulses are full and equal bilaterally Extremities:   No clubbing, cyanosis, edema, or deformity noted with normal full range of motion of  LE. Neg Homan's . No carpopedal spasm or asterixis Neurologic:  alert & oriented X3, strength normal in all extremities, and DTRs symmetrical and normal.   Skin:  Intact without suspicious lesions or rashes Psych:  normally interactive, good eye contact, and not anxious appearing.     Impression & Recommendations:  Problem # 1:  MUSCLE CRAMPS (ICD-729.82)  Orders: Venipuncture (16109) TLB-Potassium (K+) (84132-K) TLB-Calcium (82310-CA) TLB-Magnesium (Mg) (83735-MG) T-Vitamin D (25-Hydroxy) (60454-09811)  Problem # 2:  INVOLUNTARY MOVEMENTS, ABNORMAL (ICD-781.0)  Orders: Venipuncture (91478) TLB-Potassium (K+) (84132-K) TLB-Calcium (82310-CA) TLB-Magnesium (Mg) (83735-MG)  Complete Medication List: 1)  Allegra 60 Mg Tabs (Fexofenadine hcl) .Marland Kitchen.. 1 by mouth two times a day 2)  Benazepril Hcl 40 Mg Tabs (Benazepril hcl) .... Take one tablet in the morning 3)  Nasacort Aq 55 Mcg/act Aers (Triamcinolone acetonide(nasal)) .... 2 sprays each nostril daily 4)  Aciphex 20 Mg Tbec (Rabeprazole sodium) .Marland Kitchen.. 1 tablet by mouth once daily 5)  Entocort Ec 3 Mg Xr24h-cap (Budesonide) .... Take 3 capsules once daily  Patient Instructions: 1)  Recommendations depend on lab results; possibly Mag/Cal may be an option  Appended Document: FOR LEG CRAMPS--PH

## 2010-06-18 NOTE — Letter (Signed)
Summary: Cancer Screening/[facility] Personalized Risk Profile  Cancer Screening/[facility] Personalized Risk Profile   Imported By: Lanelle Bal 04/06/2010 13:13:09  _____________________________________________________________________  External Attachment:    Type:   Image     Comment:   External Document

## 2010-06-18 NOTE — Assessment & Plan Note (Signed)
Summary: ?Sinus Infection/nta   Vital Signs:  Patient profile:   60 year old female Weight:      150.4 pounds Temp:     98.8 degrees F oral Pulse rate:   60 / minute Resp:     15 per minute BP sitting:   118 / 74  (left arm) Cuff size:   regular  Vitals Entered By: Shonna Chock (August 08, 2009 12:00 PM) CC: 1.) ? Sinus infection: Sneezing, cough, and drainage x 1 week  2.) Refill Benazepril Comments .REVIEWED MED LIST, PATIENT AGREED DOSE AND INSTRUCTION CORRECT    Primary Care Provider:  Marga Melnick, MD  CC:  1.) ? Sinus infection: Sneezing, cough, and and drainage x 1 week  2.) Refill Benazepril.  History of Present Illness: Mon  03/212/2011onset of  ST, rhinitis , sneezing & head congestionw/o cough. Temp < 100 03/20 with malaise. Flu shot in Dec 2010. Rx: Chloraseptic, Tylenol, Allegra. Major symptom now is cough  Allergies: 1)  ! Pcn 2)  ! Sulfa 3)  ! Erythromycin 4)  ! * Cefzil 5)  ! * Nexium  Review of Systems General:  Complains of sweats; denies chills and fever. ENT:  Denies nasal congestion and sinus pressure; No frontal headache, facial painor purulence. Resp:  Complains of cough and wheezing; denies shortness of breath and sputum productive. Allergy:  Denies itching eyes and sneezing.  Physical Exam  General:  in no acute distress; alert,appropriate and cooperative throughout examination Ears:  External ear exam shows no significant lesions or deformities.  Otoscopic examination reveals clear canals, tympanic membranes are intact bilaterally without bulging, retraction, inflammation or discharge. Hearing is grossly normal bilaterally. Nose:  External nasal examination shows no deformity or inflammation. Nasal mucosa are pink and moist without lesions or exudates. Mouth:  Oral mucosa and oropharynx without lesions or exudates.  Teeth in good repair. Lungs:  Normal respiratory effort, chest expands symmetrically. Lungs are clear to auscultation, no crackles  or wheezes. Heart:  Normal rate and regular rhythm. S1 and S2 normal without gallop, murmur, click, rub. S4 Skin:  Intact without suspicious lesions or rashes Cervical Nodes:  No lymphadenopathy noted Axillary Nodes:  No palpable lymphadenopathy   Impression & Recommendations:  Problem # 1:  BRONCHITIS-ACUTE (ICD-466.0)  clinically no RAD  Her updated medication list for this problem includes:    Doxycycline Hyclate 100 Mg Caps (Doxycycline hyclate) .Marland Kitchen... 1 two times a day x 2 days then 1 once daily  Problem # 2:  URI (ICD-465.9) criteria for Strep & sinusitis not present Her updated medication list for this problem includes:    Allegra 60 Mg Tabs (Fexofenadine hcl) .Marland Kitchen... 1 by mouth two times a day  Complete Medication List: 1)  Allegra 60 Mg Tabs (Fexofenadine hcl) .Marland Kitchen.. 1 by mouth two times a day 2)  Benazepril Hcl 40 Mg Tabs (Benazepril hcl) .... Take one tablet in the evening 3)  Nasacort Aq 55 Mcg/act Aers (Triamcinolone acetonide(nasal)) .... 2 sprays each nostril daily 4)  Aciphex 20 Mg Tbec (Rabeprazole sodium) .Marland Kitchen.. 1 tablet by mouth once daily 5)  Doxycycline Hyclate 100 Mg Caps (Doxycycline hyclate) .Marland Kitchen.. 1 two times a day x 2 days then 1 once daily  Patient Instructions: 1)  Robitussin DM as needed for cough. 2)  Drink as much fluid as you can tolerate for the next few days. Prescriptions: BENAZEPRIL HCL 40 MG  TABS (BENAZEPRIL HCL) TAKE ONE TABLET IN THE EVENING  #90 x 3   Entered  and Authorized by:   Marga Melnick MD   Signed by:   Marga Melnick MD on 08/08/2009   Method used:   Faxed to ...       [facility]  [address]  [phone]* (retail)       [address]       [place], [place]  [address]       Ph: [phone] or [phone]       Fax: [phone]   RxID:   [id number] DOXYCYCLINE HYCLATE 100 MG CAPS (DOXYCYCLINE HYCLATE) 1 two times a day X 2 days then 1 once daily  #12 x 0   Entered and Authorized by:   Marga Melnick MD    Signed by:   Marga Melnick MD on 08/08/2009   Method used:   Faxed to ...       [facility]  [address]  [phone]* (retail)       [address]       [place], [place]  [address]       Ph: [phone] or [phone]       Fax: [phone]   RxID:   [id number]

## 2010-06-18 NOTE — Progress Notes (Signed)
   Phone Note Call from Patient   Caller: Patient Call For: nurse Summary of Call: Patient calling to report her "legs are drawing up in the calf area like a charlie horse." Patient wants to know if the Entocort she started taking could be causing this and should she stop taking the Entocort. Patient does state her diarrhea is better since starting the Entocort. Please, advise. Initial call taken by: Jesse Fall RN,  April 24, 2010 9:29 AM  Follow-up for Phone Call        Patient notified of Dr. Norval Gable recommendations. Patient already  has an appointment scheduled on 05/01/10 at 11:00 AM. Patient instructed to keep this appointment. Follow-up by: Jesse Fall RN,  April 24, 2010 10:02 AM    Additional Follow-up for Phone Call Additional follow up Details #2::    stop entocort..needs ov Follow-up by: Mardella Layman MD [facility],  April 24, 2010 9:35 AM

## 2010-06-18 NOTE — Progress Notes (Signed)
Summary: Triage   Phone Note Call from Patient Call back at Home Phone [phone]   Caller: Patient Call For: Dr. [name] Reason for Call: Talk to Nurse Summary of Call: The Entocort is causing dizziness Initial call taken by: Karna Christmas,  December 08, 2009 12:18 PM  Follow-up for Phone Call        Pt c/o dizziness.  Almost fell when walking to car yesterday.  Pt thinks dizziness is coming from entocort.  Has been taking it since mid June.  on three tabs daily.  No more problem with diarrhea. Pt did not get the rx filled for the valium that was given at OV.  Leg cramps have stopped. Follow-up by: Ashok Cordia RN,  December 08, 2009 12:43 PM    Additional Follow-up for Phone Call Additional follow up Details #2::    stop Follow-up by: Mardella Layman MD Clementeen Graham,  December 08, 2009 1:36 PM  Additional Follow-up for Phone Call Additional follow up Details #3:: Details for Additional Follow-up Action Taken: LM for pt to call.   Lupita Leash Surface RN  December 08, 2009 2:02 PM  Pt informed to stop entocort. Pt instructed to call PCP if dizziness cont's. Additional Follow-up by: Ashok Cordia RN,  December 08, 2009 2:36 PM

## 2010-06-18 NOTE — Progress Notes (Signed)
Summary: lab result start meds  Phone Note Call from Patient Call back at Home Phone [phone]   Caller: Patient Summary of Call: pt left VM requesting lab results at 3:26 pm today. pt would like to know result to see if she need to start med or do anything else for her legs cramps........Marland KitchenFelecia Deloach CMA  November 28, 2009 4:25 PM   Follow-up for Phone Call        Left message on VM informing patient all labs values normal but Vit D and its unsigned. When Dr.Hopper reviews labs I will call with futher instruction. Follow-up by: Shonna Chock CMA,  November 28, 2009 4:30 PM  Additional Follow-up for Phone Call Additional follow up Details #1::        All normal EXCEPT vitamin D level is low ; increase vitamin D by 1000 International Units  daily . Low vit D is #1 cause of  muscle pain in women. Vit D goal = 40-60. Additional Follow-up by: Marga Melnick MD,  November 28, 2009 5:18 PM    Additional Follow-up for Phone Call Additional follow up Details #2::    I called patient back with Dr.Hopper's response to vit D level, left all information on VM, patient to call if any concerns./Chrae [facility] CMA  November 28, 2009 5:19 PM

## 2010-06-18 NOTE — Assessment & Plan Note (Signed)
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 She now complains of intermittent upper abdominal discomfort, gas, bloating, and reflux symptoms despite daily AcipHex 20 mg. She has no history of hepatitis, pancreatitis, or known gallbladder or liver disease. She currently denies lower gastrointestinal or hepatobiliary complaints. She does have multiple drug allergies. She is up-to-date on her colonoscopy and endoscopy exams.  She denies anorexia, weight loss, other systemic complaints, dysphagia, nausea and vomiting. She also denies use of alcohol, cigarettes, or NSAIDs.   GI Review of Systems    Reports abdominal pain and  acid reflux.     Location of  Abdominal pain: epigastric area.    Denies belching, bloating, chest pain, dysphagia with liquids, dysphagia with solids, heartburn, loss of appetite, nausea, vomiting, vomiting blood, weight loss, and  weight gain.      Reports change in bowel habits and  constipation.     Denies anal fissure, black tarry stools, diarrhea, diverticulosis, fecal incontinence, heme positive stool, hemorrhoids, irritable bowel syndrome, jaundice, light color stool, liver problems, rectal bleeding, and  rectal pain.    Current Medications (verified): 1)  Allegra Allergy 180 Mg Tabs (Fexofenadine Hcl) .Marland Kitchen.. 1 By Mouth Two Times A Day As Needed 2)  Benazepril Hcl 40 Mg  Tabs (Benazepril Hcl) .... Take One Tablet in The Morning 3)  Aciphex 20 Mg Tbec (Rabeprazole Sodium) .Marland Kitchen.. 1 Tablet By Mouth Once Daily 4)  Lactaid 3000 Unit Tabs (Lactase) .... Two Times A Day 5)  Fluticasone Propionate 50 Mcg/act Susp (Fluticasone Propionate) .Marland Kitchen.. 1 Spray Two Times A Day  As Needed  Allergies (verified): 1)  ! Pcn 2)  ! Sulfa 3)  ! Erythromycin 4)  ! * Cefzil 5)  Nexium (Esomeprazole Magnesium)  Past History:  Past medical, surgical, family and social histories (including risk factors) reviewed for relevance to current acute and chronic problems.  Past Medical History: Reviewed history from 03/26/2010 and no changes required. 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 Hemorrhoidectomy 2002-colonoscopy polyps; granular ileitis 2006; 2007 normal, Dr [name] 08/05/04-hysteroscopy 2002-esophageal dilation; 2007 esophagitis,HH, stricture, dilation,gastric /duodenal changes(probably NSAID induced) Dr [name] G 0 P 0  Family History: Reviewed history from 03/26/2010 and no changes required. Father:cancer  of lymph nodes Mother: DM,HTN ,?CAD Siblings: brother:DM,HTN, CAD, dialysis Maternal aunts:?CAD MGM- cancer  of rectum PGF: cancer  of penis No FH of Colon Cancer:  Social History: Reviewed history from 03/26/2010 and no changes required. Single no diet Patient has never smoked.  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   Head:  Normocephalic and atraumatic. Eyes:  PERRLA, no icterus.exam deferred to patient's ophthalmologist.   Lungs:  Clear throughout to auscultation. Heart:   Regular rate and rhythm; no murmurs, rubs,  or bruits. Abdomen:  Soft, nontender and nondistended. No masses, hepatosplenomegaly or hernias noted. Normal bowel sounds. Extremities:  No clubbing, cyanosis, edema or deformities noted. Neurologic:  Alert and  oriented x4;  grossly normal neurologically. Psych:  Alert and cooperative. Normal mood and affect.   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 There is certainly no evidence of active ileitis at this time.  Problem # 2:  ABDOMINAL PAIN-EPIGASTRIC (ICD-789.06) Assessment: Deteriorated Rule Out cholelithiasis associated with chronic bile salt malabsorption. Upper abdominal ultrasound has been ordered, also trial of probiotics therapy with dailyAlign.  Other Orders: Ultrasound Abdomen (UAS)  Patient Instructions: 1)  You have been scheduled for an abdominal ultrasound on 05/06/10 @ 11 am.  2)  We have given you samples of Align to take 1 capsule by mouth once daily. If this works well for you, you may purchase this over the counter. 3)  Copy sent to : Dr. Kathie Dike 4)  The medication list was reviewed and reconciled.  All changed / newly prescribed medications were explained.  A complete medication list was provided to the patient / caregiver. Prescriptions: ACIPHEX 20 MG TBEC (RABEPRAZOLE SODIUM) 1 tablet by mouth once daily  #30 x 2   Entered by:   Lamona Curl CMA (AAMA)   Authorized by:   Mardella Layman MD [facility]   Signed by:   Lamona Curl CMA (AAMA) on 05/01/2010   Method used:   Electronically to        [facility]  [phone]* (retail)       [address]       [place], [place]  [address]       Ph: [phone] or [phone]       Fax: [phone]   RxID:   [id number]

## 2010-06-18 NOTE — Progress Notes (Signed)
Summary: Medication  Medications Added ENTOCORT EC 3 MG XR24H-CAP (BUDESONIDE) Take 3 capsules once daily       Phone Note Call from Patient Call back at [phone]   Caller: Patient Call For: Dr. [name] Reason for Call: Talk to Nurse Summary of Call: Wants to know if we have any samples of Entocort Initial call taken by: Karna Christmas,  December 02, 2009 11:56 AM  Follow-up for Phone Call        Samples given/ Follow-up by: Ashok Cordia RN,  December 02, 2009 12:45 PM    New/Updated Medications: ENTOCORT EC 3 MG XR24H-CAP (BUDESONIDE) Take 3 capsules once daily

## 2010-06-18 NOTE — Progress Notes (Signed)
Summary: Current meds   Phone Note Call from Patient Call back at Home Phone [phone]   Call For: Dr [name] Summary of Call: Questions about her current meds before procedure. Initial call taken by: Leanor Kail [facility],  April 23, 2010 10:28 AM  Follow-up for Phone Call        pt has increased reflux and she wants to discuss change in PPI or increasing her Aciphex to two times a day, which her insurance will not cover but they will cover MULTIPLE other PPIs on tier one. She made an office visit for 05/01/2010  Follow-up by: Harlow Mares CMA [facility]),  April 23, 2010 10:46 AM

## 2010-06-18 NOTE — Progress Notes (Signed)
Summary: triage   Phone Note Call from Patient Call back at Home Phone [phone]   Caller: Patient Call For: [name] Reason for Call: Talk to Nurse Summary of Call: Patient states that she's having diarrhea since last week, wants to know what to do does not want appt offered to her for 6-28. Initial call taken by: Tawni Levy,  October 27, 2009 10:20 AM  Follow-up for Phone Call        given appt. with PA for this afternoon.-Dr.Adelaine Roppolo is out of the office. Follow-up by: Teryl Lucy RN,  October 27, 2009 10:31 AM

## 2010-06-18 NOTE — Letter (Signed)
Summary: Previsit letter  [facility]  [address] [place], [place] [address]   Phone: [phone]  Fax: [phone]       04/20/2010 MRN: [id number]  Karen Evans [address] [place], [place]  [address]  Dear Ms. Yvone Neu,  Welcome to the Gastroenterology Division at [facility].    You are scheduled to see a nurse for your pre-procedure visit on 05/27/2010 at 11:00 AM on the 3rd floor at [facility], [address].  We ask that you try to arrive at our office 15 minutes prior to your appointment time to allow for check-in.  Your nurse visit will consist of discussing your medical and surgical history, your immediate family medical history, and your medications.    Please bring a complete list of all your medications or, if you prefer, bring the medication bottles and we will list them.  We will need to be aware of both prescribed and over the counter drugs.  We will need to know exact dosage information as well.  If you are on blood thinners (Coumadin, Plavix, Aggrenox, Ticlid, etc.) please call our office today/prior to your appointment, as we need to consult with your physician about holding your medication.   Please be prepared to read and sign documents such as consent forms, a financial agreement, and acknowledgement forms.  If necessary, and with your consent, a friend or relative is welcome to sit-in on the nurse visit with you.  Please bring your insurance card so that we may make a copy of it.  If your insurance requires a referral to see a specialist, please bring your referral form from your primary care physician.  No co-pay is required for this nurse visit.     If you cannot keep your appointment, please call [phone] to cancel or reschedule prior to your appointment date.  This allows [place] the opportunity to schedule an appointment for another patient in need of care.    Thank you for choosing [facility] for your medical  needs.  We appreciate the opportunity to care for you.  Please visit [place] at our website  to learn more about our practice.                     Sincerely.                                                                                                                   The Gastroenterology Division

## 2010-06-18 NOTE — Letter (Signed)
Summary: BP Log from Patient  BP Log from Patient   Imported By: Lanelle Bal 05/14/2010 13:24:46  _____________________________________________________________________  External Attachment:    Type:   Image     Comment:   External Document

## 2010-06-18 NOTE — Progress Notes (Signed)
Summary: Samples   Phone Note Call from Patient Call back at Home Phone [phone]   Caller: Patient Call For: Dr.Patterson Reason for Call: Talk to Nurse Summary of Call: Pt wants to get sample of Aciphex Initial call taken by: Raechel Chute,  April 14, 2010 4:59 PM  Follow-up for Phone Call        samples up front, pt aware Follow-up by: Harlow Mares CMA [facility]),  April 14, 2010 5:10 PM    New/Updated Medications: ACIPHEX 20 MG TBEC (RABEPRAZOLE SODIUM) 1 tablet by mouth once daily

## 2010-06-18 NOTE — Assessment & Plan Note (Signed)
Summary: diarrhea x 1 wk. Dr.patterson pt.   History of Present Illness Visit Type: Follow-up Visit Primary GI MD: Sheryn Bison MD FACP FAGA Primary Provider: Marga Melnick, MD Requesting Provider: n/a Chief Complaint: pt states she is having nocturnal diarrhea.  She has not had problems for several years, but now things have flared.  She states she has started drinking "Big Blue" or "Big Red" soft drinks and wonders if that could be a problem History of Present Illness:   60 Y.O. FEMALE KNOWN TO DR. 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 IBD MARKERS IN 2004 WERE POSITIVE. SHE WAS TREATED WITH ENTOCORT IN 2007 AND DID WELL.  SHE COMES IN TODAY WITH C/O ONSET OF DIARRHEA ABOUT A WEEK AGO. SHE IS A FAIR HISTORIAN-SAYS HAVING A COUPLE OF LOOSE STOOLS PER DAY AND TRIED TO IGNORE IT. OVER THE PAST 24 HOURS HAS HAD NORE DIARRHEA AND WAS UP 3 X LAST NIGHT WITH URGENCY AND LOOSE STOOL. NO MEL;ENA OR HEME. NO FEVER/CHILLS. APPETITE OK, NO NAUSEA,SHE HAS HAD SOME MILD CRAMPING. SHE HAS BEEN DRINKING A LOT OF "BIG RED AND BIG BLUE" SODA-SAYS IT IS NOT DIET BUT WONDERS IF AGGRAVATING DIARRHEA. SHE TOOK A COURSE OF DOXYCYCLINE IN 3/11.   GI Review of Systems    Reports abdominal pain and  acid reflux.     Location of  Abdominal pain: lower abdomen.    Denies belching, bloating, chest pain, dysphagia with liquids, dysphagia with solids, heartburn, loss of appetite, nausea, vomiting, vomiting blood, and  weight loss.      Reports change in bowel habits and  diarrhea.     Denies anal fissure, black tarry stools, diverticulosis, fecal incontinence, heme positive stool, hemorrhoids, irritable bowel syndrome, jaundice, light color stool, liver problems, rectal bleeding, and  rectal pain.    Current Medications (verified): 1)  Allegra  60 Mg Tabs (Fexofenadine Hcl) .Marland Kitchen.. 1 By Mouth Two Times A Day 2)  Benazepril Hcl 40 Mg  Tabs (Benazepril Hcl) .... Take One Tablet in The Morning 3)  Nasacort Aq 55 Mcg/act Aers (Triamcinolone Acetonide(Nasal)) .... 2 Sprays Each Nostril Daily 4)  Aciphex 20 Mg Tbec (Rabeprazole Sodium) .Marland Kitchen.. 1 Tablet By Mouth Once Daily  Allergies: 1)  ! Pcn 2)  ! Sulfa 3)  ! Erythromycin 4)  ! 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 Hemorrhoidectomy 2002-colonoscopy polyps; granular ileitis 2006; 2007 normal 08/05/04-hysteroscopy 2002-esophageal dilation; 2007 esophagitis,HH, stricture, dilation,gastric /duodenal changes(probably NSAID induced) Dr [name] G 0 P 0  Family History: Reviewed history from 12/31/2008 and no changes required. Father: CA of lymph nodes Mother: DM,HTN ,?CAD Siblings: brother-DM,HTN, ?CAD Maternal aunts-?CAD MGM- CA of rectum PGF-CA of penis  Social History: Single no diet Patient has never smoked.  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[employer])  Review of Systems  The patient denies allergy/sinus, anemia, anxiety-new, arthritis/joint pain, back pain, blood in urine, breast changes/lumps, change in vision, confusion, cough, coughing up blood, depression-new, fainting, fatigue, fever, headaches-new, hearing problems, heart murmur, heart rhythm changes, itching, menstrual pain, muscle pains/cramps, night sweats, nosebleeds, pregnancy symptoms,  shortness of breath, skin rash, sleeping problems, sore throat, swelling of feet/legs, swollen lymph glands, thirst - excessive, urination - excessive, urination changes/pain, urine leakage, and voice change.         OTHERWISE  SEE HPI  Vital Signs:  Patient profile:   60 year old female Menstrual status:  postmenopausal Height:      63 inches Weight:      148 pounds BMI:     26.31 Pulse rate:   72 / minute Pulse rhythm:   regular BP sitting:   106 / 70  (left arm) Cuff size:   regular  Vitals Entered By: Francee Piccolo CMA [facility]) (October 27, 2009 1:34 PM)  Physical Exam  General:  Well developed, well nourished, no acute distress. Head:  Normocephalic and atraumatic. Eyes:  PERRLA, no icterus. Lungs:  Clear throughout to auscultation. Heart:  Regular rate and rhythm; no murmurs, rubs,  or bruits. Abdomen:  SOFT, NO FOCAL TENDERNESS, SUBJECTIVE MILD TENDERNESS ACROSS LOWER ABDOMEN, NO GUARDING, NO REBOUND,BS+ Rectal:  NOT DONE Extremities:  No clubbing, cyanosis, edema or deformities noted. Neurologic:  Alert and  oriented x4;  grossly normal neurologically. Psych:  Alert and cooperative. Normal mood and affect.   Impression & Recommendations:  Problem # 1:  DIARRHEA (ICD-787.91) Assessment New 60 YO FEMALE WITH HX OF MILD ILEITIS WITH  C/O DIARRHEA X ONE WEEK, PROGRESSIVE,MILD LOWER ABDOMINAL CRAMPING. R/O MILD EXACERBATION OF ILEITIS,R/O C.DIFF,R/O SECONDARY TO DIETARY /SORBITOL INTOLERANCE.  LABS AS BELOW STOOL STUDIES AS BELOW STOP DRINKING SODA RESTART ENTOCORT 3 MG, 3 TABLETS DAILY FOLLOW UP WITH DR. PATTERSON IN 2-3 WEEKS. Orders: TLB-BMP (Basic Metabolic Panel-BMET) (80048-METABOL) TLB-CRP-High Sensitivity (C-Reactive Protein) (86140-FCRP) TLB-CBC Platelet - w/Differential (85025-CBCD) T-Culture, C-Diff Toxin A/B ([id number]) T-Culture, Stool (87045/[id number]) T-Fecal WBC (09811-91478)  Problem # 2:  GERD (ICD-530.81) Assessment: Unchanged CONTINUE ACIPHEX 20 MG DAILY- REFILL TODAY Orders: TLB-BMP (Basic Metabolic Panel-BMET) (80048-METABOL) TLB-CRP-High Sensitivity (C-Reactive Protein) (86140-FCRP) TLB-CBC Platelet - w/Differential (85025-CBCD) T-Culture, C-Diff Toxin  A/B ([id number]) T-Culture, Stool (87045/[id number]) T-Fecal WBC (57846-96295)  Patient Instructions: 1)  Please go to lab, basement level. 2)  We sent perscriptions for Entocort , samples given , and we have refilled the Aciphex. 3)  We made you a follow up appointment with Dr. [name] on 11-28-09. Appointment card given. 4)  Copy sent to : Marga Melnick, Md 5)  The medication list was reviewed and reconciled.  All changed / newly prescribed medications were explained.  A complete medication list was provided to the patient / caregiver. Prescriptions: ACIPHEX 20 MG TBEC (RABEPRAZOLE SODIUM) 1 tablet by mouth once daily  #30 x 11   Entered by:   Lowry Ram NCMA   Authorized by:   Sammuel Cooper PA-c   Signed by:   Lowry Ram NCMA on 10/27/2009   Method used:   Electronically to        [facility]  [phone]* (retail)       [address]       [place], [place]  [address]       Ph: [phone] or [phone]       Fax: [phone]   RxID:   [id number] ENTOCORT EC 3 MG XR24H-CAP (BUDESONIDE) Take 3 capsules once daily  #90 x 2   Entered by:   Lowry Ram NCMA   Authorized by:   Sammuel Cooper PA-c   Signed by:   Lowry Ram NCMA on 10/27/2009  Method used:   Electronically to        [facility]  [phone]* (retail)       [address]       [place], [place]  [address]       Ph: [phone] or [phone]       Fax: [phone]   RxID:   [id number] ENTOCORT EC 3 MG XR24H-CAP (BUDESONIDE) Take 3 tablets once daily  #90 x 2   Entered by:   Lowry Ram NCMA   Authorized by:   Sammuel Cooper PA-c   Signed by:   Lowry Ram NCMA on 10/27/2009   Method used:   Electronically to        [facility]  [phone]* (retail)       [address]       [place], [place]  [address]       Ph: [phone] or [phone]       Fax: [phone]   RxID:    [id number]

## 2010-06-18 NOTE — Progress Notes (Signed)
Summary: refill  Phone Note Refill Request   Refills Requested: Medication #1:  ALLEGRA 60 MG TABS 1 by mouth two times a day [BMN] patient wants new rx sent to [facility]  -- she is changing pharmacy   Method Requested: Fax to Local Pharmacy Initial call taken by: Okey Regal [place],  October 27, 2009 4:59 PM    Prescriptions: ALLEGRA 60 MG TABS (FEXOFENADINE HCL) 1 by mouth two times a day Brand medically necessary #60 Not Speci x 2   Entered by:   Shonna Chock   Authorized by:   Marga Melnick MD   Signed by:   Shonna Chock on 10/28/2009   Method used:   Electronically to        [facility]  [phone]* (retail)       [address]       [place], [place]  [address]       Ph: [phone] or [phone]       Fax: [phone]   RxID:   [id number]

## 2010-06-18 NOTE — Progress Notes (Signed)
Summary: Entocort causing abd pain   Phone Note Call from Patient Call back at Home Phone [phone]   Call For: Dr [name] Reason for Call: Talk to Nurse Summary of Call: Entocort is waking her up in the wee hours of the am with pain in her stomach Initial call taken by: Leanor Kail [facility],  December 16, 2009 10:16 AM  Follow-up for Phone Call        Pt instructed to stop entocort.  Had been told to stop it a few weeks ago due to dizziness but pt missunderstood.  SHe will stop it now and report back if no improvement. Follow-up by: Ashok Cordia RN,  December 16, 2009 10:33 AM

## 2010-06-18 NOTE — Progress Notes (Signed)
Summary: Ultrasound results  Medications Added ALLEGRA 60 MG TABS (FEXOFENADINE HCL)  ALLEGRA ALLERGY 180 MG TABS (FEXOFENADINE HCL) 1 by mouth two times a day as needed ALLEGRA 60 MG TABS (FEXOFENADINE HCL) 1 by mouth two times a day [BMN] BENAZEPRIL HCL 40 MG  TABS (BENAZEPRIL HCL) TAKE ONE TABLET IN THE EVENING BENAZEPRIL HCL 40 MG  TABS (BENAZEPRIL HCL) TAKE ONE TABLET IN THE morning NEXIUM 40 MG CPDR (ESOMEPRAZOLE MAGNESIUM) Take 1 tablet by mouth once a day (pharmacy-please d/c aciphex rx..insurance will not cover) ACIPHEX 20 MG  TBEC (RABEPRAZOLE SODIUM) Take one by mouth two times a day NEXIUM 40 MG CPDR (ESOMEPRAZOLE MAGNESIUM) Take 1 tablet by mouth once a day (pharmacy-please d/c aciphex rx..insurance will not cover) ACIPHEX 20 MG  TBEC (RABEPRAZOLE SODIUM) Take one by mouth two times a day ACIPHEX 20 MG  TBEC (RABEPRAZOLE SODIUM) Take 1 each day 30 minutes before meals NASACORT AQ 55 MCG/ACT AERS (TRIAMCINOLONE ACETONIDE(NASAL)) 2 sprays each nostril daily NASACORT AQ 55 MCG/ACT AERS (TRIAMCINOLONE ACETONIDE(NASAL)) 2 sprays each nostril daily ACIPHEX 20 MG TBEC (RABEPRAZOLE SODIUM) 1 tablet by mouth once daily ACIPHEX 20 MG TBEC (RABEPRAZOLE SODIUM) 1 tablet by mouth once daily ACIPHEX 20 MG TBEC (RABEPRAZOLE SODIUM) 1 tablet by mouth once daily DOXYCYCLINE HYCLATE 100 MG CAPS (DOXYCYCLINE HYCLATE) 1 two times a day X 2 days then 1 once daily DOXYCYCLINE HYCLATE 100 MG CAPS (DOXYCYCLINE HYCLATE) 1 two times a day X 2 days then 1 once daily ENTOCORT EC 3 MG XR24H-CAP (BUDESONIDE) Take 3 capsules once daily ENTOCORT EC 3 MG XR24H-CAP (BUDESONIDE) Take 3 capsules once daily ENTOCORT EC 3 MG XR24H-CAP (BUDESONIDE) Take 3 capsules once daily ENTOCORT EC 3 MG XR24H-CAP (BUDESONIDE) Take 3 tablets once daily LACTAID 3000 UNIT TABS (LACTASE) two times a day DIAZEPAM 2 MG TABS (DIAZEPAM) 1 by mouth q 6-8 hrs prn DIAZEPAM 2 MG TABS (DIAZEPAM) 1 by mouth q 6-8 hrs prn FLUTICASONE  PROPIONATE 50 MCG/ACT SUSP (FLUTICASONE PROPIONATE) 1 spray two times a day as needed ALIGN  CAPS (PROBIOTIC PRODUCT) Take 1 capsule by mouth once a day LIBRAX 2.5-5 MG  CAPS (CLIDINIUM-CHLORDIAZEPOXIDE) 1 by mouth q6-8hrs prn       Phone Note Call from Patient Call back at Home Phone [phone]   Caller: Patient Call For: Nurse Summary of Call: Pt wants to know ultrasound results. Initial call taken by: Christie Nottingham CMA [facility]),  May 04, 2010 4:49 PM  Follow-up for Phone Call        Patient called for results of her u/s. Dr [name], results are in EMR. Please advise. Janice Norrie, RN 05/04/10 @ [phone]  Additional Follow-up for Phone Call Additional follow up Details #1::        p.r.n. Librax every 6-8 hours. Continue other meds. Additional Follow-up by: Mardella Layman MD FACG,  May 05, 2010 10:49 AM    Additional Follow-up for Phone Call Additional follow up Details #2::    Lmom that I ordered Librax per Dr [name] to [facility] [address]. Patient may call back for problems. Follow-up by: Graciella Freer RN,  May 05, 2010 11:40 AM

## 2010-06-18 NOTE — Progress Notes (Signed)
Summary: diarrhea   Phone Note Call from Patient Call back at [facility] Phone [phone] Call back at Work Phone [phone]   Caller: Patient Call For: Dr. [name] Reason for Call: Talk to Nurse Complaint: Headache Summary of Call: wants to know if she should take Entocort for diarrhea... says she has some Entocort Initial call taken by: Vallarie Mare,  April 15, 2010 2:39 PM  Follow-up for Phone Call        Message left for patient to call back Graciella Freer RN  April 15, 2010 3:48 PM  patient c/o diarrhea, abdominal pain.  Denies nausea or vomiting.  C/O esophageal burning.  She hasn't been taking her Aciphex.  Hulan Saas did provide her Aciphex samples yesterday.  Patient is advised to avoid mint, citrus, caffeine, fatty greasy foods, chocolate, redwine, and acidic foods.  She is advised to eat smaller meals, not go to bed or exercise within 2 hours of a meal/  patient would like to resume entocort.  Dr [name] please advise. Darcey Nora, RN CGRN Follow-up by: Graciella Freer RN,  April 15, 2010 4:42 PM  Additional Follow-up for Phone Call Additional follow up Details #1::        ok .Marland KitchenMarland KitchenEntocort 9mg .day...schedule colonoscopy please... Additional Follow-up by: Mardella Layman MD [facility],  April 16, 2010 8:37 AM    Additional Follow-up for Phone Call Additional follow up Details #2::    Message left for patient to call back at work number. Jesse Fall RN  April 17, 2010 3:46 PM Spoke with patient. She has Entocort at home. Patient given Dr. Norval Gable recommendation of Entocort 9 mg day (3 tablets of her 3 mg pills). She does not want to schedule the colonoscopy today but she will call on Monday to to this. Follow-up by: Jesse Fall RN,  April 17, 2010 4:26 PM  Additional Follow-up for Phone Call Additional follow up Details #3:: Details for Additional Follow-up Action Taken: Patient called to schedule colonoscopy but she could not get off work on the  day available. She will call back when she finds out when she can get off. Jesse Fall RN  April 20, 2010 3:02 PM Patient called and scheduled for colonoscopy on 06/03/10 at 11 AM with previsit on 05/27/10 at 11 AM. Additional Follow-up by: Jesse Fall RN,  April 20, 2010 4:41 PM

## 2010-06-18 NOTE — Assessment & Plan Note (Signed)
Summary: high bp--cs   Vital Signs:  Patient profile:   60 year old female Height:      63 inches Weight:      150.50 pounds BMI:     26.76 Temp:     98.0 degrees F oral Pulse rate:   78 / minute Pulse rhythm:   regular Resp:     16 per minute BP sitting:   146 / 90  (right arm) Cuff size:   regular  Vitals Entered By: Mervin Kung CMA (Sep 23, 2009 2:34 PM) CC: room 5  Pt had elevated bp today at dentist 170/100. Is Patient Diabetic? No   Primary Care Provider:  Marga Melnick, MD  CC:  room 5  Pt had elevated bp today at dentist 170/100.Marland Kitchen  History of Present Illness: Ms Karen Evans is a 60 year old female who presents today following a trip to her dentist where she was found to be hypertensive.  BP at her dentist's office was reportedly 170/100.  Reports that she did take her benazepril this AM  prior to her apt.  She denies lower extremity edema or headaches.  Allergies: 1)  ! Pcn 2)  ! Sulfa 3)  ! Erythromycin 4)  ! * Cefzil 5)  ! * Nexium  Physical Exam  General:  Well-developed,well-nourished,in no acute distress; alert,appropriate and cooperative throughout examination Lungs:  Normal respiratory effort, chest expands symmetrically. Lungs are clear to auscultation, no crackles or wheezes. Heart:  Normal rate and regular rhythm. S1 and S2 normal without gallop, murmur, click, rub or other extra sounds. Extremities:  No edema noted   Impression & Recommendations:  Problem # 1:  HYPERTENSION (ICD-401.9) Assessment Comment Only Repeat manual BP in the exam room was 135/80 in the right arm.  Patient has a blood pressure monitor at home.  Will continue current meds and have patient follow up in 2 weeks with Dr Frederik Pear nurse for a follow up BP check.  Review of prior visits indicates that blood pressures have been historically well controlled.  Pt was given BP parameters which should prompt a call to Dr. [name].   Her updated medication list for this problem  includes:    Benazepril Hcl 40 Mg Tabs (Benazepril hcl) .Marland Kitchen... Take one tablet in the morning  Complete Medication List: 1)  Allegra 60 Mg Tabs (Fexofenadine hcl) .Marland Kitchen.. 1 by mouth two times a day 2)  Benazepril Hcl 40 Mg Tabs (Benazepril hcl) .... Take one tablet in the morning 3)  Nasacort Aq 55 Mcg/act Aers (Triamcinolone acetonide(nasal)) .... 2 sprays each nostril daily 4)  Aciphex 20 Mg Tbec (Rabeprazole sodium) .Marland Kitchen.. 1 tablet by mouth once daily  Patient Instructions: 1)  Please check blood pressure once a day. 2)  Call Dr [name] if you blood pressure is greater than 150/90 for further instructions. 3)  Please follow up in 2 weeks for a visit with Dr. Frederik Pear nurse for a BP check.  Current Allergies (reviewed today): ! PCN ! SULFA ! ERYTHROMYCIN ! * CEFZIL ! * NEXIUM

## 2010-06-18 NOTE — Assessment & Plan Note (Signed)
Summary: 1 MO. F/U.Marland KitchenMarland KitchenAS.    History of Present Illness Visit Type: Follow-up Visit Primary GI MD: Sheryn Bison MD FACP FAGA Primary Provider: Marga Melnick, MD Requesting Provider: n/a Chief Complaint: One month f/u for diarrhea and abd pain. Pt stopped Entocort due to causing abd pain. Pt states that she has burning in her lower legs but denies any GI complaints  History of Present Illness:   This Patient completely asymptomatic on Lactaid supplementation. She has no abdominal pain or diarrhea other gastrointestinal symptoms. Review of her labs shows these to be unremarkable. Her colonoscopy scheduled for next October.   GI Review of Systems      Denies abdominal pain, acid reflux, belching, bloating, chest pain, dysphagia with liquids, dysphagia with solids, heartburn, loss of appetite, nausea, vomiting, vomiting blood, weight loss, and  weight gain.        Denies anal fissure, black tarry stools, change in bowel habit, constipation, diarrhea, diverticulosis, fecal incontinence, heme positive stool, hemorrhoids, irritable bowel syndrome, jaundice, light color stool, liver problems, rectal bleeding, and  rectal pain.    Current Medications (verified): 1)  Allegra 60 Mg Tabs (Fexofenadine Hcl) .Marland Kitchen.. 1 By Mouth Two Times A Day 2)  Benazepril Hcl 40 Mg  Tabs (Benazepril Hcl) .... Take One Tablet in The Morning 3)  Nasacort Aq 55 Mcg/act Aers (Triamcinolone Acetonide(Nasal)) .... 2 Sprays Each Nostril Daily 4)  Aciphex 20 Mg Tbec (Rabeprazole Sodium) .Marland Kitchen.. 1 Tablet By Mouth Once Daily 5)  Lactaid 3000 Unit Tabs (Lactase) .... Two Times A Day 6)  Diazepam 2 Mg Tabs (Diazepam) .Marland Kitchen.. 1 By Mouth Q 6-8 Hrs Prn  Allergies (verified): 1)  ! Pcn 2)  ! Sulfa 3)  ! Erythromycin 4)  ! * Cefzil 5)  Nexium (Esomeprazole Magnesium)  Past History:  Past medical, surgical, family and social histories (including risk factors) reviewed for relevance to current acute and chronic problems.  Past  Medical History: Reviewed history from 10/27/2009 and no changes required. 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 Hemorrhoidectomy 2002-colonoscopy polyps; granular ileitis 2006; 2007 normal 08/05/04-hysteroscopy 2002-esophageal dilation; 2007 esophagitis,HH, stricture, dilation,gastric /duodenal changes(probably NSAID induced) Dr [name] G 0 P 0  Family History: Reviewed history from 12/31/2008 and no changes required. Father: CA of lymph nodes Mother: DM,HTN ,?CAD Siblings: brother-DM,HTN, ?CAD Maternal aunts-?CAD MGM- CA of rectum PGF-CA of penis No FH of Colon Cancer:  Social History: Reviewed history from 10/27/2009 and no changes required. Single no diet Patient has never smoked.  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  The patient denies allergy/sinus, anemia, anxiety-new, blood in urine, breast changes/lumps, change in vision, confusion, cough, coughing up blood, depression-new, fainting, fatigue, fever, headaches-new, hearing problems, heart murmur, heart rhythm changes, itching, menstrual pain, night sweats, nosebleeds, pregnancy symptoms, shortness of breath, skin rash, sleeping problems, sore throat, swelling of feet/legs, swollen lymph glands, thirst - excessive , urination - excessive , urination changes/pain, urine leakage, vision changes, and voice change.    Vital Signs:  Patient profile:   60 year old female Menstrual status:  postmenopausal Height:      63 inches Weight:      152 pounds BMI:  27.02 BSA:     1.72 Pulse rate:   76 / minute Pulse rhythm:   regular BP sitting:   132 / 80  (left arm) Cuff size:    regular  Vitals Entered By: Ok Anis CMA (December 30, 2009 11:48 AM)  Physical Exam  General:  Well developed, well nourished, no acute distress.healthy appearing.   Head:  Normocephalic and atraumatic. Eyes:  PERRLA, no icterus.exam deferred to patient's ophthalmologist.   Abdomen:  Soft, nontender and nondistended. No masses, hepatosplenomegaly or hernias noted. Normal bowel sounds. Psych:  Alert and cooperative. Normal mood and affect.   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 Obviously one of her main problems is lactose intolerance and she is much better with Lactaid supplementation. Her colonoscopy is due next October, and she has a family history of colon carcinoma in her grandfather.  Problem # 2:  DIARRHEA (ICD-787.91) Assessment: Improved  Problem # 3:  REFLUX ESOPHAGITIS (ICD-530.11) Assessment: Improved continue AcipHex 20 mg a day and standard antireflux maneuvers.  Patient Instructions: 1)  Please continue current medications.  2)  The medication list was reviewed and reconciled.  All changed / newly prescribed medications were explained.  A complete medication list was provided to the patient / caregiver. 3)  Copy sent to : Dr. Marga Melnick. 4)  Please continue current medications.  5)  Avoid foods high in acid content ( tomatoes, citrus juices, spicy foods) . Avoid eating within 3 to 4 hours of lying down or before exercising. Do not over eat; try smaller more frequent meals. Elevate head of bed four inches when sleeping.

## 2010-06-18 NOTE — Progress Notes (Signed)
Summary: Ultrasound results   Phone Note Call from Patient Call back at [phone]   Caller: Patient Call For: Dr. [name] Reason for Call: Lab or Test Results Summary of Call: Calling about her ultrasound results Initial call taken by: Karna Christmas,  May 06, 2010 9:19 AM  Follow-up for Phone Call        Notified patient of normal U/S results. Patient is scheduled for a Pre Visit and Colonoscopy on 06/03/10. Patient stated she is doing better with the Align and wants to know if she still needs the Colonoscopy? Dr [name], patient is due for recall colonoscopy in 2012- last colon October, 2007. Graciella Freer RN 05/06/10 @ 1000  Additional Follow-up for Phone Call Additional follow up Details #1::        NO    Additional Follow-up for Phone Call Additional follow up Details #2::    ?? WHY SHE IS SCHEDULED...NOT NEEDED IF [facility] WELL Follow-up by: Mardella Layman MD Clementeen Graham,  May 06, 2010 10:22 AM  Additional Follow-up for Phone Call Additional follow up Details #3:: Details for Additional Follow-up Action Taken: She is scheduled for a Recall Colon in 2012 and is already scheduled for1/18/12- does she need to keep it? Thanks. Graciella Freer RN  May 06, 2010 2:29 PM   lmom for patient at [phone], that per Dr [name], she does not have to have the Colonoscopy done- cancelled appointment. Additional Follow-up by: Graciella Freer RN,  May 06, 2010 2:31 PM  Attempted to reach patient again about appointment, no answer on machine. Darcey Nora RN, [facility]  May 07, 2010 2:36 PM   Lmom for patient to verify I cancelled her Colonoscopy with Dr [name]. LMOM at [phone] and at [phone]. Graciella Freer RN  May 13, 2010 11:09 AM

## 2010-06-18 NOTE — Assessment & Plan Note (Signed)
Summary: F/U Diarrhea, saw PA    History of Present Illness Visit Type: Follow-up Visit Primary GI MD: Sheryn Bison MD FACP FAGA Primary Provider: Marga Melnick, MD Requesting Provider: n/a Chief Complaint: F/u for diarrhea. Pt states that she is better and denies any GI complaints  History of Present Illness:   Diarrhea and abdominal cramping is markedly improved on Entocort 9 mg a day. Her only symptom currently is spasms in her lower extremities and some arthralgias. She denies other systemic complaints. She is on AcipHex 20 mg a day for GERD which is well controlled.review of labs from last clinic visit were unremarkable. She did have a positive stool- lactoferrin consistent with inflammatory bowel disease.She saw Dr. Marga Melnick yesterday and labs are pending including potassium and magnesium level.She is on calcium and vitamin D for osteopenia.   GI Review of Systems      Denies abdominal pain, acid reflux, belching, bloating, chest pain, dysphagia with liquids, dysphagia with solids, heartburn, loss of appetite, nausea, vomiting, vomiting blood, weight loss, and  weight gain.        Denies anal fissure, black tarry stools, change in bowel habit, constipation, diarrhea, diverticulosis, fecal incontinence, heme positive stool, hemorrhoids, irritable bowel syndrome, jaundice, light color stool, liver problems, rectal bleeding, and  rectal pain.    Current Medications (verified): 1)  Allegra 60 Mg Tabs (Fexofenadine Hcl) .Marland Kitchen.. 1 By Mouth Two Times A Day 2)  Benazepril Hcl 40 Mg  Tabs (Benazepril Hcl) .... Take One Tablet in The Morning 3)  Nasacort Aq 55 Mcg/act Aers (Triamcinolone Acetonide(Nasal)) .... 2 Sprays Each Nostril Daily 4)  Aciphex 20 Mg Tbec (Rabeprazole Sodium) .Marland Kitchen.. 1 Tablet By Mouth Once Daily 5)  Entocort Ec 3 Mg Xr24h-Cap (Budesonide) .... Take 3 Capsules Once Daily 6)  Lactaid 3000 Unit Tabs (Lactase) .... Two Times A Day  Allergies (verified): 1)  ! Pcn 2)   ! Sulfa 3)  ! Erythromycin 4)  ! * Cefzil 5)  Nexium (Esomeprazole Magnesium)  Past History:  Past medical, surgical, family and social histories (including risk factors) reviewed for relevance to current acute and chronic problems.  Past Medical History: Reviewed history from 10/27/2009 and no changes required. 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 Hemorrhoidectomy 2002-colonoscopy polyps; granular ileitis 2006; 2007 normal 08/05/04-hysteroscopy 2002-esophageal dilation; 2007 esophagitis,HH, stricture, dilation,gastric /duodenal changes(probably NSAID induced) Dr [name] G 0 P 0  Family History: Reviewed history from 12/31/2008 and no changes required. Father: CA of lymph nodes Mother: DM,HTN ,?CAD Siblings: brother-DM,HTN, ?CAD Maternal aunts-?CAD MGM- CA of rectum PGF-CA of penis  Social History: Reviewed history from 10/27/2009 and no changes required. Single no diet Patient has never smoked.  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  The patient denies allergy/sinus, anemia, anxiety-new, arthritis/joint pain, back pain, blood in urine, breast changes/lumps, change in vision, confusion, cough, coughing up blood, depression-new, fainting, fatigue, fever, headaches-new, hearing problems, heart murmur, heart rhythm changes, itching, menstrual pain, night sweats, nosebleeds, pregnancy symptoms, shortness of breath, skin rash, sleeping problems, sore throat, swelling of feet/legs, swollen lymph glands, thirst - excessive , urination - excessive , urination changes/pain, urine leakage, vision changes, and voice change.   General:  Denies fever, chills,  sweats, anorexia, fatigue,  weakness, malaise, weight loss, and sleep disorder. Eyes:  Denies blurring, diplopia, irritation, discharge, vision loss, scotoma, eye pain, and photophobia. ENT:  Denies earache, ear discharge, tinnitus, decreased hearing, nasal congestion, loss of smell, nosebleeds, sore throat, hoarseness, and difficulty swallowing. Resp:  Denies dyspnea at rest, dyspnea with exercise, cough, sputum, wheezing, coughing up blood, and pleurisy. GI:  Complains of gas/bloating and diarrhea; denies difficulty swallowing, pain on swallowing, nausea, indigestion/heartburn, vomiting, vomiting blood, abdominal pain, jaundice, constipation, change in bowel habits, bloody BM's, black BMs, and fecal incontinence. MS:  Complains of muscle weakness, muscle cramps, and leg pain with exertion; denies joint pain / LOM, joint swelling, joint stiffness, joint deformity, low back pain, muscle atrophy, leg pain at night, and shoulder pain / LOM hand / wrist pain (CTS). Derm:  Denies rash, itching, dry skin, hives, moles, warts, and unhealing ulcers. Psych:  Denies depression, anxiety, memory loss, suicidal ideation, hallucinations, paranoia, phobia, and confusion. Endo:  Denies cold intolerance, heat intolerance, polydipsia, polyphagia, polyuria, unusual weight change, and hirsutism. Heme:  Denies bruising, bleeding, enlarged lymph nodes, and pagophagia.  Vital Signs:  Patient profile:   60 year old female Menstrual status:  postmenopausal Height:      63 inches Weight:      152 pounds BMI:     27.02 BSA:     1.72 Pulse rate:   76 / minute Pulse rhythm:   regular BP sitting:   138 / 82  (left arm) Cuff size:   regular  Vitals Entered By: Ok Anis CMA (November 28, 2009 11:24 AM)  Physical Exam  General:  Well developed, well nourished, no acute distress.healthy appearing.   Head:  Normocephalic and atraumatic. Eyes:  PERRLA, no icterus.exam deferred to patient's ophthalmologist.   Abdomen:  Soft, nontender and  nondistended. No masses, hepatosplenomegaly or hernias noted. Normal bowel sounds. Msk:  Symmetrical with no gross deformities. Normal posture. Extremities:  No clubbing, cyanosis, edema or deformities noted. Neurologic:  Alert and  oriented x4;  grossly normal neurologically. Psych:  Alert and cooperative. Normal mood and affect.   Impression & Recommendations:  Problem # 1:  INVOLUNTARY MOVEMENTS, ABNORMAL (ICD-781.0) Assessment Improved Continue Entocort 9 mg a day with office followup in one month's time. B12 level has been ordered.Continue Lactaid tablets p.r.n. for lactose intolerance.  Problem # 2:  MUSCLE CRAMPS (ICD-729.82) Assessment: Deteriorated Probably Side effect from Entocort. Magnesium and calcium and potassium levels pending. I have prescribed Valium 2 mg every 6-8 hours as needed.  Problem # 3:  DIARRHEA (ICD-787.91) Assessment: Improved  Problem # 4:  REFLUX ESOPHAGITIS (ICD-530.11) Assessment: Improved Continue reflex maneuvers and daily Nexium 20 mg.  Other Orders: TLB-B12, Serum-Total ONLY (46962-X52) TLB-Ferritin (82728-FER) TLB-Folic Acid (Folate) (82746-FOL) TLB-IBC Pnl (Iron/FE;Transferrin) (83550-IBC)  Patient Instructions: 1)  Please continue current medications.  2)  Begin diazepam as needed for spasms. 3)  The medication list was reviewed and reconciled.  All changed / newly prescribed medications were explained.  A complete medication list was provided to the patient / caregiver. 4)  Please schedule a follow-up appointment in 1 month.  5)  Copy sent to : Dr. Marga Melnick Prescriptions: DIAZEPAM 2 MG TABS (DIAZEPAM) 1 by mouth q 6-8 hrs prn  #65 x 2   Entered by:   Ashok Cordia RN   Authorized by:   Mardella Layman MD [facility]   Signed by:   Ashok Cordia RN on 11/28/2009   Method used:   Print  then Give to Patient   RxID:   [id number]

## 2010-07-07 ENCOUNTER — Ambulatory Visit: Payer: Self-pay | Admitting: Internal Medicine

## 2010-07-07 ENCOUNTER — Ambulatory Visit (INDEPENDENT_AMBULATORY_CARE_PROVIDER_SITE_OTHER): Payer: 59 | Admitting: Internal Medicine

## 2010-07-07 ENCOUNTER — Encounter: Payer: Self-pay | Admitting: Internal Medicine

## 2010-07-07 DIAGNOSIS — J209 Acute bronchitis, unspecified: Secondary | ICD-10-CM

## 2010-07-07 DIAGNOSIS — J029 Acute pharyngitis, unspecified: Secondary | ICD-10-CM

## 2010-07-07 DIAGNOSIS — J069 Acute upper respiratory infection, unspecified: Secondary | ICD-10-CM

## 2010-07-14 NOTE — Assessment & Plan Note (Signed)
Summary: cough/sore throat/fever on and off/kn   Vital Signs:  Patient profile:   60 year old female Menstrual status:  postmenopausal Weight:      153.4 pounds BMI:     27.27 Temp:     99.3 degrees F oral Pulse rate:   84 / minute Resp:     15 per minute BP sitting:   132 / 78  (left arm) Cuff size:   regular  Vitals Entered By: Shonna Chock CMA (July 07, 2010 11:57 AM) CC: Cough, sore throat, and fever off/on, URI symptoms Comments Treid OTC meds: Robitussin and [facility] Cold Relief   Primary Care Provider:  ramachandran,ajith  CC:  Cough, sore throat, and fever off/on, and URI symptoms.  History of Present Illness:  Onset 07/03/2010 as sneezing , eyes watering & dry cough; she now  reports nasal congestion, sore throat, and productive cough, but denies purulent nasal discharge and earache.  Associated symptoms include low-grade fever (<100.5 degrees) and wheezing.  The patient denies dyspnea.  The patient denies headache.  Risk factors for Strep sinusitis include bilateral facial pain.  The patient denies the following risk factors for Strep sinusitis: tooth pain and tender adenopathy.   Flu shot in Oct  Current Medications (verified): 1)  Allegra Allergy 180 Mg Tabs (Fexofenadine Hcl) .Marland Kitchen.. 1 By Mouth Two Times A Day As Needed 2)  Benazepril Hcl 40 Mg  Tabs (Benazepril Hcl) .... Take One Tablet in The Morning 3)  Aciphex 20 Mg Tbec (Rabeprazole Sodium) .Marland Kitchen.. 1 Tablet By Mouth Once Daily 4)  Lactaid 3000 Unit Tabs (Lactase) .... Two Times A Day 5)  Fluticasone Propionate 50 Mcg/act Susp (Fluticasone Propionate) .Marland Kitchen.. 1 Spray Two Times A Day As Needed 6)  Align  Caps (Probiotic Product) .... Take 1 Capsule By Mouth Once A Day 7)  Librax 2.5-5 Mg  Caps (Clidinium-Chlordiazepoxide) .Marland Kitchen.. 1 By Mouth Q6-8hrs Prn  Allergies: 1)  ! Pcn 2)  ! Sulfa 3)  ! Erythromycin 4)  ! * Cefzil 5)  Nexium (Esomeprazole Magnesium)  Review of Systems Eyes:  Denies discharge, eye pain, and red  eye.  Physical Exam  General:  well-nourished,in no acute distress; alert,appropriate and cooperative throughout examination Eyes:  No corneal or conjunctival inflammation noted. EOMI. Vision grossly normal. Ears:  External ear exam shows no significant lesions or deformities.  Otoscopic examination reveals clear canals, tympanic membranes are intact bilaterally without bulging, retraction, inflammation or discharge. Hearing is grossly normal bilaterally. Nose:  External nasal examination shows no deformity or inflammation. Nasal mucosa are  boggy without lesions or exudates. Hyponadsal speech Mouth:  Oral mucosa and oropharynx without lesions or exudates.  Teeth in good repair. Mild  pharyngeal erythema.   Lungs:  Normal respiratory effort, chest expands symmetrically. Lungs are clear to auscultation, no crackles or wheezes. Cervical Nodes:  No lymphadenopathy noted Axillary Nodes:  No palpable lymphadenopathy   Impression & Recommendations:  Problem # 1:  PHARYNGITIS-ACUTE (ICD-462)  Beta strep negative  Her updated medication list for this problem includes:    Doxycycline Hyclate 100 Mg Caps (Doxycycline hyclate) .Marland Kitchen... 1 two times a day x 2 days then  daily; avoid direct sun  Problem # 2:  BRONCHITIS-ACUTE (ICD-466.0)  Her updated medication list for this problem includes:    Doxycycline Hyclate 100 Mg Caps (Doxycycline hyclate) .Marland Kitchen... 1 two times a day x 2 days then  daily; avoid direct sun    Benzonatate 100 Mg Caps (Benzonatate) .Marland Kitchen... 1 every 6-8 as needed  for cough  Problem # 3:  URI (ICD-465.9) facial pain & low grade fever Her updated medication list for this problem includes:    Allegra Allergy 180 Mg Tabs (Fexofenadine hcl) .Marland Kitchen... 1 by mouth two times a day as needed    Benzonatate 100 Mg Caps (Benzonatate) .Marland Kitchen... 1 every 6-8 as needed for cough  Complete Medication List: 1)  Allegra Allergy 180 Mg Tabs (Fexofenadine hcl) .Marland Kitchen.. 1 by mouth two times a day as needed 2)   Benazepril Hcl 40 Mg Tabs (Benazepril hcl) .... Take one tablet in the morning 3)  Aciphex 20 Mg Tbec (Rabeprazole sodium) .Marland Kitchen.. 1 tablet by mouth once daily 4)  Lactaid 3000 Unit Tabs (Lactase) .... Two times a day 5)  Fluticasone Propionate 50 Mcg/act Susp (Fluticasone propionate) .Marland Kitchen.. 1 spray two times a day as needed 6)  Align Caps (Probiotic product) .... Take 1 capsule by mouth once a day 7)  Librax 2.5-5 Mg Caps (Clidinium-chlordiazepoxide) .Marland Kitchen.. 1 by mouth q6-8hrs prn 8)  Vitamin D 1000 Unit Tabs (Cholecalciferol) .Marland Kitchen.. 1 by mouth once daily 9)  Doxycycline Hyclate 100 Mg Caps (Doxycycline hyclate) .Marland Kitchen.. 1 two times a day x 2 days then  daily; avoid direct sun 10)  Benzonatate 100 Mg Caps (Benzonatate) .Marland Kitchen.. 1 every 6-8 as needed for cough  Other Orders: Rapid Strep (04540)   Patient Instructions: 1)  Stay on Allegra. Neti pot once daily - two times a day until sinuses are claer. 2)  Drink as much NON dairy  fluid as you can tolerate for the next few days. 3)  Recommended remaining out of work for  07/07/2010 Prescriptions: BENZONATATE 100 MG CAPS (BENZONATATE) 1 every 6-8 as needed for cough  #21 x 0   Entered and Authorized by:   Marga Melnick MD   Signed by:   Marga Melnick MD on 07/07/2010   Method used:   Electronically to        [facility]  [phone]* (retail)       [address]       [place], [place]  [address]       Ph: [phone] or [phone]       Fax: [phone]   RxID:   [id number] DOXYCYCLINE HYCLATE 100 MG CAPS (DOXYCYCLINE HYCLATE) 1 two times a day X 2 days then  daily; avoid direct sun  #12 x 0   Entered and Authorized by:   Marga Melnick MD   Signed by:   Marga Melnick MD on 07/07/2010   Method used:   Electronically to        [facility]  [phone]* (retail)       [address]       [place], [place]  [address]       Ph: [phone] or [phone]       Fax: [phone]    RxID:   [id number]    Orders Added: 1)  Rapid Strep [31517] 2)  Est. Patient Level III [61607]

## 2010-08-11 ENCOUNTER — Other Ambulatory Visit: Payer: Self-pay | Admitting: Internal Medicine

## 2010-09-29 NOTE — Assessment & Plan Note (Signed)
 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  She traveled to [place] over the  holidays.  She and her step-father developed an acute diarrheal  syndrome, which has since abated.  She is back to having regular 2 to 3  soft bowel movements a day without abdominal pain or systemic  complaints.   She weighs 152 pounds, blood pressure 124/78, pulse 84 and regular.  ABDOMEN:  Entirely benign without organomegaly, masses, or tenderness.  Bowel sounds were normal.  Inspection of the rectum was unremarkable, as was rectal exam.  There  was soft stool in the rectal vault that was guaiac negative.   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  I do not think we need to restart Entocort  at this time.   RECOMMENDATIONS:  1. Check CBC, sed rate, and anemia profile.  2. Continue other multiple medications per Dr. [name].     [name]. [name], MD, [name], FAGA  Electronically Signed    DRP/MedQ  DD: 05/26/2007  DT: 05/26/2007  Job #: [id number]   cc:   Maxie Better, M.D.  [name]. [name], MD,FACP,FCCP

## 2010-10-02 NOTE — Op Note (Signed)
[name], Karen Evans               ACCOUNT NO.:  [id number]   MEDICAL RECORD NO.:  [id number]          PATIENT TYPE:  AMB   LOCATION:  SDC                           FACILITY:  [facility]   PHYSICIAN:  Maxie Better, M.D.DATE OF BIRTH:  05-Apr-1951   DATE OF PROCEDURE:  08/05/2004  DATE OF DISCHARGE:                                 OPERATIVE REPORT   PREOPERATIVE DIAGNOSES:  1.  Perimenopausal/dysfunctional uterine bleeding.  2.  Endometrial masses.   POSTOPERATIVE DIAGNOSES:  1.  Perimenopausal/dysfunctional uterine bleeding.  2.  Endometrial masses.   PROCEDURE:  Diagnostic hysteroscopy, hysteroscopic resection of endometrial  polyps, dilation and curettage.   ANESTHESIA:  General.   SURGEON:  Maxie Better, M.D.   PROCEDURE:  Under adequate general anesthesia, the patient was placed in the  dorsal lithotomy position.  She was sterilely prepped and draped in the  usual fashion and the bladder was catheterized of a moderate amount of  urine.  The laminaria that was in place was removed.  Bimanual examination  revealed a 12-week size irregular uterus.  No adnexal masses could be  appreciated.  The bivalve speculum was placed in the vagina, a single-tooth  tenaculum was placed on the anterior lip of the cervix.  The cervix was then  serially dilated up to a #31 Pratt dilator.  A resectoscope primed with  sorbitol was introduced into the uterine cavity.  A polypoid lesion was  noted on the posterior aspect of the uterus.  Thickened endometrium was  noted on the lateral walls.  The fundus was otherwise unremarkable.  Tubal  ostia were not seen.  Using a double loop resectoscope, the areas of  polypoidal thickening were resected.  The resectoscope was removed.  The  cavity was curetted, resected and curetted.  When an adequate resection and  no evidence of polypoid lesions noted, the resectoscope was then removed,  the cavity was then curetted.  Specimens labeled, endometrial  curettings  and endometrial polyps, were sent to pathology.  All instruments were then  removed from the vagina.  Estimated blood loss was minimal.  Fluid deficit  was 400 mL.  Complication was none.  The patient tolerated the procedure  well, was transferred to the recovery room in stable condition.      [place]/MEDQ  D:  08/05/2004  T:  08/05/2004  Job:  [id number]

## 2010-10-02 NOTE — Assessment & Plan Note (Signed)
[facility]                         GASTROENTEROLOGY OFFICE NOTE   DOYCE, STONEHOUSE                      MRN:          [id number]  DATE:08/23/2006                            DOB:          04/09/1951    SUBJECTIVE:  Karen Evans is doing well and has been able to taper down on her  Entocort to 6 mg a day.  She really denies complaints except for rather  marked lactose intolerance and she has continued to refuse lactate  tablets as previously recommended.   Her vital signs are all stable.  I did not perform a general examination  at this time.   RECOMMENDATIONS:  1. Taper off of Entocort over the next month.  2. Lactate tablets with a lactose-free diet.  3. GI followup as needed.  4. Continued GYN followup with Dr. Maxie Better for a previous      abnormal pelvic ultrasound.     [name]. [name], MD, [name], FAGA  Electronically Signed    DRP/MedQ  DD: 08/23/2006  DT: 08/23/2006  Job #: [id number]   cc:   Maxie Better, M.D.

## 2010-10-02 NOTE — Assessment & Plan Note (Signed)
[facility]                           GASTROENTEROLOGY OFFICE NOTE   Karen Evans, Karen Evans                      MRN:          [id number]  DATE:02/03/2006                            DOB:          [birth year]    Karen Evans is referred by Dr. [name] for evaluation of continued lower  abdominal pain, unrelieved by taking Protonix twice a day.  In fact, patient  feels that her lower abdominal pain is caused by her PPI medication.   I have known Karen Evans for many years because of lower abdominal  cramping, she carries a diagnosis of irritable bowel syndrome.  Last  colonoscopy was in November 2004 at which time she had small bowel biopsies  that were normal.  She had weakly positive inflammatory bowel disease  serology of questionable significance.  She also has a problem with  recurrent rectal fissures.  She was supposed to have colonoscopy exam in  June 2006 but did not keep this appointment.   To me, the patient denies upper GI complaints of acid reflux as long as she  uses p.r.n. antacids.  She has been on twice a day Protonix for a good  period of time.  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  She certainly denies dysphagia or  odynophagia at this time.  She has had previous ultrasounds for gallbladder  which also had been negative, but this has been several years.  Other  problems include chronic iron-deficiency anemia which has been felt  secondary to menometrorrhagia and some asthmatic bronchitis. She also has a  history of a thyroid goiter and currently is on various medication  including:  1. Caltrate 300 mg twice a day.  2. Protonix 40 mg twice a day.  3. HCTZ 12.5 mg a day.  4. Toprol 25 mg a day.  5. Pamine Forte 5 mg twice a day.  6. Imodium p.r.n.   Other diagnoses reviewed in her chart include:  Irritable bowel syndrome and  fibroids of her uterus.   FAMILY HISTORY:  Remarkable for hypercholesterolemia.  She had a grandmother  with colon cancer, and her grandfather with prostate cancer.   EXAMINATION:  Shows a healthy appearing black female appearing her stated  age in no acute distress.  Her weight is 162 pounds, which is her normal weight.  Blood pressure is  120/86.  Pulse was 84 and regular.  I could not appreciate stigmatic chronic liver disease.  Her abdominal exam was entirely benign without hepatosplenomegaly, abdominal  masses, tenderness, or distension.  Bowel sounds were normal.  Inspection of the rectum showed some active external hemorrhoids anterior-  laterally.  I could not appreciate rectal masses or tenderness and stool was  guaiac negative.   ASSESSMENT:  1. Probable irritable bowel syndrome with worse abdominal pains secondary      to high dose proton pump inhibitor therapy.  2. History of chronic acid reflux with questionable Barrett's mucosa.  3. Recurrent  bleeding hemorrhoids.  4. Chronic thyroid dysfunction with known thyroid goiter.  5. History of mild lactose intolerance.   RECOMMENDATIONS:  1. Try to obtain volume 1 of her chart for review, especially her      pathology reports.  2. Continue acid reflux regimen but will let her use p.r.n. antacids, stop      Protonix at this time.  3. Konsyl local anal kit including fiber supplements, soothing wipes, and      local cortisone/Xylocaine cream 1-2 times a day as needed.  4. Colonoscopy exam as previously recommended.  5. Continue other medications as per Dr. [name].   ADDENDUM:  I did find Karen Evans's pathology report from October 2002.  Biopsy of the esophagus did not show evidence of Barrett's mucosa.  Also,  small-bowel biopsy did not show any evidence of any small-bowel disease such  as celiac disease.                                    [name]. [name], MD, [place], [place]  Job#  [id number]  DRP/MedQ  DD:  02/03/2006  DT:  02/04/2006  Job #:  [id number]   cc:   [name]. [name], MD,FACP,FCCP

## 2010-10-02 NOTE — Assessment & Plan Note (Signed)
[facility]                         GASTROENTEROLOGY OFFICE NOTE   Karen Evans, Karen Evans                      MRN:          [id number]  DATE:05/24/2006                            DOB:          [birth year]    PROBLEM:  1. Abdominal pain.  2. Gastroesophageal reflux disease.  3. Probable Crohn's.   HISTORY:  Karen Evans is a pleasant 60 year old African American female who has  been undergoing recent evaluation by Dr. [name] for complaints of  dysphagia and indigestion as well as abdominal pain and diarrhea.  She  did have EGD on March 09, 2006, which showed a 3-cm hiatal hernia and  a distal esophageal stricture which was [facility] dilated.  Colonoscopy  appeared normal but biopsies were taken of the ileum and showed a  nonspecific ileitis with several lymphoid aggregates.  She had previous  IBD serologies done which were positive.  Biopsies from the small bowel  at the time of EGD were negative.  Biopsy of the stomach showed focal  ulceration.  There was no H. pylori and there was no evidence of  Barrett's.  She has been placed on a trial of Entocort over the past 6  weeks and comes back in today for followup.  Interestingly, she has  stopped the Protonix as she thought it was aggravating her abdominal  pain and is not on anything for her reflux at the time.  She is a  somewhat difficult historian but says that she believes the Entocort is  helping.  She is not having as much abdominal discomfort, still has some  cramping.  Usually will have 2-3 bowel movements early in the morning  and then is fine the rest of the day.  If she has any discomfort, she  feels it in the right mid abdomen.  She has not noted any melena or  hematochezia.   She has no complaints of dysphagia or odynophagia and is not having much  in the way of heartburn or indigestion currently.   CURRENT MEDICATIONS:  1. Caltrate 600, one half b.i.d.  2. Toprol 25, one half b.i.d.  3.  Carafate p.r.n.  4. Spironolactone 25 every day.  5. Allegra 60 mg, two p.o. every day.  6. Entocort 9 mg daily.  7. B12 supplement daily.   PHYSICAL EXAMINATION:  GENERAL:  A well developed African American  female in no acute distress.  VITAL SIGNS:  Weight is 161, blood pressure 122/74, pulse is 75.  CARDIOVASCULAR:  Regular rate and rhythm with S1 and S2.  No murmur,  rub, or gallop.  ABDOMEN:  Soft.  Bowel sounds are active.  She is really nontender in  the right abdomen.  She has some fullness and almost a mass effect in  the left lower quadrant which is minimally tender.   IMPRESSION:  1. Gastroesophageal reflux disease with peptic stricture, improved      post dilation.  2. 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  3. Left lower quadrant mass effect, question fibroid.  The patient      relates she does have a history of fibroids, has not  had any recent      imaging.   PLAN:  1. Start Aciphex 20 mg daily.  2. Leave on Entocort 9 mg daily for a total of 2 months and then we      will plan to taper her off.  3. Check pelvic ultrasound.  4. Advise the patient to make a GYN followup appointment.  She sees      Dr. Cherly Hensen.  5. Follow up here in one month.      Mike Gip, PA-C  Electronically Signed      [name]. [name], MD, [name], FAGA  Electronically Signed   AE/MedQ  DD: 05/25/2006  DT: 05/25/2006  Job #: [id number]

## 2010-10-02 NOTE — Assessment & Plan Note (Signed)
[facility]                                   ON-CALL NOTE   Karen Evans, Karen Evans                        MRN:          [id number]  DATE:01/03/2006                            DOB:          [birth year]    TIME RECEIVED:  5:10 p.m.   TELEPHONE:  [id number].   The patient has a medication question.  She has been taking one half a  tablet of hydrochlorothiazide 25 mg a day for some time for mild  hypertension.  Dr. [name] recently changed her medications to 50 mg of  something once a day (she cannot remember what the name of it is).  Her  blood pressure today was 142/76 and she feels fine.  When she went to the  pharmacy to pick up the new medication, she got nervous and wanted to ask [place]  if this indeed is what she should do.  She did take her usual  hydrochlorothiazide this morning.  My response is that I am sure Dr. [name]  had his reasons for changing her medication, although I have no idea what  they were.  I think she is fine for this evening to not get the prescription  filled.  She should contact Dr. Frederik Pear office tomorrow to ask any further  questions.                                   Tera Mater. Clent Ridges, MD   SAF/MedQ  DD:  01/03/2006  DT:  01/04/2006  Job #:  [id number]   cc:   [name]. [name], MD, FACP, [facility]

## 2010-10-02 NOTE — Assessment & Plan Note (Signed)
[facility]                        [facility] OFFICE NOTE   Karen Evans                      MRN:          [id number]  DATE:08/16/2006                            DOB:          07/21/1950    Karen Evans had been taking her benazepril 40 mg 1-1/2 daily, having  misread her label.  She was to take 1/2 to 1 to maintain her blood  pressure less than 130/85.  She has been subjectively intolerant to  multiple antihypertensive medications.  She questioned whether  metoprolol might be causing headaches and worried about low potassium  due to spironolactone.   For the last week she has decreased the dose to 40 mg daily.  Her blood  pressure now is 120/80.   She has leg cramps when walking but no true claudication.  She has  shortness of breath ith an incline on occasion.  She does not describe  exertional dyspnea.   She is on Allegra 60 mg twice a day for allergies.  It does not contain  a decongestant.   At this time blood pressure is well controlled at 120/80 as noted.  She  has no neck vein distention at 0 degrees.  Chest is clear to  auscultation.  She had a grade 1 systolic murmur.  Pedal pulses are  intact.  There are no periumbilical bruits.   She has been asked to monitor her blood pressure with the goal being  less than 130/85 on benazepril 40 mg daily.  If the blood pressure is  significantly lower than this, then benazepril could be decreased to 1/2  daily.   She will be asked to repeat the BUN, creatinine, and potassium in 8-12  weeks.  On January 23 potassium was 3.8, BUN 8, and creatinine 1.0   She has purchased a wrist cuff; she has been asked to validate her  recordings through the __________  nurse.   I did review the risk associated with blood pressure elevation;  presently she is at normal risk with the present blood pressure regimen.     Karen Dubin. [name], MD,FACP,FCCP  Electronically Signed    [facility]/MedQ   DD: 08/16/2006  DT: 08/16/2006  Job #: [id number]

## 2010-10-02 NOTE — Assessment & Plan Note (Signed)
[name]                         GASTROENTEROLOGY OFFICE NOTE   Karen Evans, Karen Evans                      MRN:          [id number]  DATE:04/12/2006                            DOB:          12-24-1950    [name] underwent colonoscopy and endoscopy on March 09, 2006.  Her  endoscopy showed distal esophageal stricture that was dilated, and she  had erosive gastroduodenitis with a negative CLO biopsy and also a  negative gastric mucosal biopsy for Helicobacter.  She was started on  Carafate four times a day, and has been doing better symptomatically.  Her colonoscopy was fairly unremarkable.  Although her ileum appeared  normal, biopsies of the ileum showed chronic inflammation and also  lymphoid aggregates suggestive of a nonspecific ileitis.  Small bowel  biopsy showed no evidence of celiac disease.   LABORATORY DATA:  In the past, has shown a mildly elevated sed rate of  unexplained etiology.  Workup in the past has been suspicious for low-  grade chronic inflammatory bowel disease.   Based on her clinical symptomatology of lower abdominal pain, diarrhea,  abnormal ileal biopsy, she was placed on Entocort 9 mg a day.  She has  had a rather remarkable improvement; says she is 80% better.  She is  having regular bowel movements without significant pain.   EXAMINATION:  Her exam today shows a weight of 159 pounds.  Blood  pressure 130/76.  Pulse was 68 and regular.  I could not appreciate hepatosplenomegaly, abdominal masses or  significant tenderness.  Bowel sounds were normal.   ASSESSMENT:  1. 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  2. 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  Since the patient denies      any use of nonsteroidal anti-inflammatory drugs, had a negative H.      pylori screen, and has  had no response to past proton pump      inhibitor therapy.   RECOMMENDATIONS:  I have decided to keep [name] on Entocort 9 mg a day  with office followup in 6 weeks' time.  Hopefully, we can taper her  medication at that time, depending on her clinical response.  I asked  her to use Carafate suspension on a p.r.n. basis.  When she returns to  clinic, she will need B-12 level checked ileitis, and I cannot see where  this has been  done in a good period of time.  On reviewing her chart, she did have  positive IBD serologies also in October 2004 suggestive of Crohn's  disease.     [name]. [name], MD, [name], FAGA  Electronically Signed    DRP/MedQ  DD: 04/12/2006  DT: 04/12/2006  Job #: [id number]   cc:   [name]. [name], MD,FACP,FCCP

## 2010-10-02 NOTE — Assessment & Plan Note (Signed)
[facility]                           GASTROENTEROLOGY OFFICE NOTE   MONICKA, CYRAN                      MRN:          [id number]  DATE:02/03/2006                            DOB:          [birth year]    ADDENDUM  I did find Mrs. Bodin's pathology report from October 2002.  Biopsy of  the esophagus did not show evidence of Barrett's mucosa.  Also, small-bowel  biopsy did not show any evidence of any small-bowel disease such as celiac  disease.                                   [name]. [name], MD, Clementeen Graham, [place]   DRP/MedQ  DD:  02/03/2006  DT:  02/04/2006  Job #:  [id number]

## 2010-10-02 NOTE — Letter (Signed)
January 23, 2006     [name]. [name], MD, FACG, FACP  [address]  [place] [address]   RE:  Karen Evans, Karen Evans  MRN:  [id number]  /  DOB:  November 05, 1950   Dear Onalee Hua:   Would you be kind enough to see [name] in consultation for dyspepsia despite  Protonix twice a day.  According to your last note of October 30, 2004 she was  not having significant reflux symptoms at that time.  Apparently she had an  upper endoscopy in 2002 with esophageal dilatation.   She was concerned that metoprolol might be causing her symptoms; she was  reassured that this is not the case.   Because of her dyspepsia despite b.i.d. proton pump inhibitors and her past  history of Barrett's esophagus your expertise is needed.  Thank you for your  evaluation and recommendations.    Sincerely,      [name]. [name], MD,FACP,FCCP   [facility]/MedQ  DD:  01/23/2006  DT:  01/24/2006  Job #:  [id number]

## 2010-11-07 ENCOUNTER — Other Ambulatory Visit: Payer: Self-pay | Admitting: Gastroenterology

## 2010-12-03 ENCOUNTER — Telehealth: Payer: Self-pay | Admitting: Internal Medicine

## 2010-12-03 NOTE — Telephone Encounter (Signed)
Spoke w/ pt appt scheduled to take a look at arm.

## 2010-12-03 NOTE — Telephone Encounter (Signed)
Left msg for pt to return call.

## 2010-12-04 ENCOUNTER — Ambulatory Visit (INDEPENDENT_AMBULATORY_CARE_PROVIDER_SITE_OTHER): Payer: 59 | Admitting: Internal Medicine

## 2010-12-04 ENCOUNTER — Encounter: Payer: Self-pay | Admitting: Internal Medicine

## 2010-12-04 VITALS — BP 130/84 | Temp 98.2°F | Wt 151.0 lb

## 2010-12-04 DIAGNOSIS — L0390 Cellulitis, unspecified: Secondary | ICD-10-CM

## 2010-12-04 DIAGNOSIS — L0291 Cutaneous abscess, unspecified: Secondary | ICD-10-CM

## 2010-12-04 DIAGNOSIS — M542 Cervicalgia: Secondary | ICD-10-CM

## 2010-12-04 MED ORDER — DOXYCYCLINE HYCLATE 100 MG PO TABS: 100.0000 mg | ORAL_TABLET | Freq: Two times a day (BID) | ORAL | Status: AC

## 2010-12-04 NOTE — Patient Instructions (Signed)
Stop the Neosporin ; this agent very often causes skin reactions. Never use this  unless you  see pus draining from the wound. To prevent cramping the neck at night ;use a cervical pillow at night.  Avoid direct sun while you're on the doxycycline.

## 2010-12-04 NOTE — Progress Notes (Signed)
  Subjective:    Patient ID: Karen Evans, female    DOB: December 30, 1950, 60 y.o.   MRN: [id number]  HPI she awoke on 7/17 with some discomfort in the R  neck and shoulder area. She noted some tenderness and erythema at the superior aspect of the right shoulder. She been using Neosporin on this area and alcohol. Additionally she's been taking nonsteroidal anti-inflammatory agents  She denies fever or  chills ; sweats are related to menopause.  She denied any  numbness, tingling, or weakness in the right upper extremity. She's had no urinary or stool incontinence.   Review of Systems     Objective:   Physical Exam on exam she is in no acute distress.  She has some discomfort with right lateral rotation of her neck but full ROM.  She has no lymphadenopathy about the head, neck, or axilla.  Chest is clear.  Heart rate is regular without murmur.  There is an area of faint erythema 4 x 2 cm on the superior aspect of the right shoulder. Excoriations are present.  The hands revealed no significant osteoarthritis or degenerative change.           Assessment & Plan:  #1  neck and shoulder pain which is most likely related to positioning while asleep  #2 there is mild cellulitis over the superior aspect of the right shoulder.  #3 she has multiple drug allergies with hives.  Plan: #1  The Neosporin should be stopped as it's quite likely cause a drug reaction. Doxycycline will be prescribed as this is one of few  Antibiotic to  which she's not allergic. Warm compresses will be recommended to the shoulder area  #2 warm compresses and ibuprofen her times a day as needed were  recommend for the neck pain. Additionally she might benefit from a cervical pillow  at night.

## 2011-01-15 ENCOUNTER — Ambulatory Visit (INDEPENDENT_AMBULATORY_CARE_PROVIDER_SITE_OTHER): Payer: 59 | Admitting: Internal Medicine

## 2011-01-15 ENCOUNTER — Encounter: Payer: Self-pay | Admitting: Internal Medicine

## 2011-01-15 VITALS — BP 130/76 | HR 60 | Temp 98.3°F | Wt 150.0 lb

## 2011-01-15 DIAGNOSIS — I10 Essential (primary) hypertension: Secondary | ICD-10-CM

## 2011-01-15 MED ORDER — BENAZEPRIL HCL 40 MG PO TABS: 40.0000 mg | ORAL_TABLET | Freq: Every day | ORAL | Status: DC

## 2011-01-15 NOTE — Progress Notes (Signed)
  Subjective:    Patient ID: Karen Evans, female    DOB: [birth year], 61 y.o.   MRN: [id number]  HPI  HYPERTENSION: Disease Monitoring  Blood pressure range: 142/90 with wrist cuff  on 8/30 after eating fried potatoes  Chest pain: no   Dyspnea: no   Claudication: no  Medication compliance: yes,   Medication Side Effects  Lightheadedness: no   Urinary frequency: no   Edema: no     Preventitive Healthcare:  Exercise: yes, walking on job in plant   Diet Pattern: no plan  Salt Restriction: no      Review of Systems     Objective:   Physical Exam General appearance is one of good health and nourishment w/o distress.   Heart:  Normal rate and regular rhythm. S1 and S2 normal without gallop, murmur, click, rub .S4 Lungs:Chest clear to auscultation; no wheezes, rhonchi,rales ,or rubs present.No increased work of breathing.   Abdomen: bowel sounds normal, soft and non-tender without masses, organomegaly or hernias noted.  No guarding or rebound . No AAA or bruits  Skin:Warm & dry.  Intact without suspicious lesions or rashes ; no jaundice or tenting             Assessment & Plan:  #1 hypertension; she appears sensitive to salt intake.  Plan: I've asked her to monitor her  Wrist cuff readings against the arm cuff in the pharmacy. Modified sodium restriction would be recommended rather than increasing medications.

## 2011-01-15 NOTE — Patient Instructions (Signed)
Your BP goal = AVERAGE < 135/85. Avoid ingestion of  excess salt/sodium.Cook with pepper & other spices . Use the salt substitute "No Salt"(unless your potassium has been elevated) OR the Mrs Dash products to season food @ the table. Avoid foods which taste salty or "vinegary" as their sodium contentet will be high.  

## 2011-02-11 ENCOUNTER — Encounter: Payer: Self-pay | Admitting: *Deleted

## 2011-02-18 ENCOUNTER — Other Ambulatory Visit (INDEPENDENT_AMBULATORY_CARE_PROVIDER_SITE_OTHER): Payer: 59

## 2011-02-18 ENCOUNTER — Encounter: Payer: Self-pay | Admitting: Gastroenterology

## 2011-02-18 ENCOUNTER — Ambulatory Visit (INDEPENDENT_AMBULATORY_CARE_PROVIDER_SITE_OTHER): Payer: 59 | Admitting: Gastroenterology

## 2011-02-18 VITALS — BP 122/78 | HR 72 | Ht 63.0 in | Wt 149.6 lb

## 2011-02-18 DIAGNOSIS — E739 Lactose intolerance, unspecified: Secondary | ICD-10-CM

## 2011-02-18 DIAGNOSIS — K5010 Crohn's disease of large intestine without complications: Secondary | ICD-10-CM

## 2011-02-18 DIAGNOSIS — K219 Gastro-esophageal reflux disease without esophagitis: Secondary | ICD-10-CM | POA: Insufficient documentation

## 2011-02-18 DIAGNOSIS — IMO0002 Reserved for concepts with insufficient information to code with codable children: Secondary | ICD-10-CM | POA: Insufficient documentation

## 2011-02-18 LAB — HEPATIC FUNCTION PANEL
AST: 19 U/L (ref 0–37)
Albumin: 3.7 g/dL (ref 3.5–5.2)
Alkaline Phosphatase: 80 U/L (ref 39–117)

## 2011-02-18 LAB — BASIC METABOLIC PANEL
Chloride: 106 mEq/L (ref 96–112)
GFR: 114.2 mL/min (ref >60.00)
Glucose, Bld: 101 mg/dL — ABNORMAL HIGH (ref 70–99)
Potassium: 4.3 mEq/L (ref 3.5–5.1)
Sodium: 141 mEq/L (ref 135–145)

## 2011-02-18 LAB — TSH: TSH: 0.5 u[IU]/mL (ref 0.35–5.50)

## 2011-02-18 LAB — CBC WITH DIFFERENTIAL/PLATELET
Basophils Absolute: 0 10*3/uL (ref 0.0–0.1)
HCT: 40.6 % (ref 36.0–46.0)
Lymphs Abs: 1.7 10*3/uL (ref 0.7–4.0)
MCV: 81.2 fl (ref 78.0–100.0)
Monocytes Absolute: 0.6 10*3/uL (ref 0.1–1.0)
Platelets: 249 10*3/uL (ref 150.0–400.0)
RDW: 13.7 % (ref 11.5–14.6)

## 2011-02-18 LAB — SEDIMENTATION RATE: Sed Rate: 17 mm/hr (ref 0–22)

## 2011-02-18 MED ORDER — RABEPRAZOLE SODIUM 20 MG PO TBEC: 20.0000 mg | DELAYED_RELEASE_TABLET | Freq: Every day | ORAL | Status: DC

## 2011-02-18 NOTE — Patient Instructions (Signed)
Labs ordered for you to have drawn today on basement floor. Aciphex samples and Rx. Sent to your pharmacy. High fiber diet information given Fibercon twice daily

## 2011-02-18 NOTE — Progress Notes (Signed)
This is a 60 year old African American female with recurrent ileitis currently in remission with no therapy. She is known lactose intolerance and uses when necessary Lactaid tablets. She has mild constipation but denies abdominal pain, melena or hematochezia, upper GI or hepatobiliary complaints. For GERD she takes AcipHex 20 mg a day, but has been noncompliant recently. She specifically denies dysphagia or nocturnal reflux symptoms. There is no history of fever, chills, or other systemic complaints. She takes Lotensin 40 mg a day for hypertension.  Current Medications, Allergies, Past Medical History, Past Surgical History, Family History and Social History were reviewed in [employer] record.  Pertinent Review of Systems Negative   Physical Exam: The appearance no acute distress. Chest is clear and she is in a regular rhythm without murmurs gallops or rubs. There is no organomegaly, abdominal masses or tenderness. Bowel sounds are normal and peripheral extremities are unremarkable. Mental status is normal.    Assessment and Plan: Chronic GERD well controlled with daily AcipHex 20 mg which has been renewed. I have advised her supplements, high fiber diet, and liberal by mouth fluids. Continue to use Lactaid tablets as needed. Her inflammatory bowel disease is in remission at this time. We will follow her when necessary basis as needed. Encounter Diagnoses  Name Primary?  . Crohn's colitis Yes  . GERD (gastroesophageal reflux disease)   . Lactose disaccharidase deficiency

## 2011-02-19 LAB — GLIA (IGA/G) + TTG IGA
Gliadin IgA: 12.5 U/mL (ref <20)
Tissue Transglutaminase Ab, IgA: 13.1 U/mL (ref <20)

## 2011-02-25 ENCOUNTER — Telehealth: Payer: Self-pay | Admitting: Gastroenterology

## 2011-02-25 NOTE — Telephone Encounter (Signed)
Discussed lab results with pt and informed her Dr [name] signed off on them. Some areas were low, but didn't warrant any action by Dr [name]. Pt requested I send labs to Dr [name]; she has an appt in November; routed labs to Dr [name].

## 2011-03-18 ENCOUNTER — Encounter: Payer: Self-pay | Admitting: Internal Medicine

## 2011-03-29 ENCOUNTER — Encounter: Payer: Self-pay | Admitting: Internal Medicine

## 2011-03-29 ENCOUNTER — Ambulatory Visit (INDEPENDENT_AMBULATORY_CARE_PROVIDER_SITE_OTHER): Payer: 59 | Admitting: Internal Medicine

## 2011-03-29 VITALS — BP 124/82 | HR 64 | Temp 97.9°F | Resp 12 | Ht 63.25 in | Wt 147.2 lb

## 2011-03-29 DIAGNOSIS — I10 Essential (primary) hypertension: Secondary | ICD-10-CM

## 2011-03-29 DIAGNOSIS — R7309 Other abnormal glucose: Secondary | ICD-10-CM

## 2011-03-29 DIAGNOSIS — E559 Vitamin D deficiency, unspecified: Secondary | ICD-10-CM

## 2011-03-29 DIAGNOSIS — Z0000 Encounter for general adult medical examination without abnormal findings: Secondary | ICD-10-CM

## 2011-03-29 DIAGNOSIS — E785 Hyperlipidemia, unspecified: Secondary | ICD-10-CM

## 2011-03-29 DIAGNOSIS — E7800 Pure hypercholesterolemia, unspecified: Secondary | ICD-10-CM | POA: Insufficient documentation

## 2011-03-29 NOTE — Patient Instructions (Signed)
Please sign release of records to allow review of the labs done 10/23 at urgent care. Preventive Health Care: Exercise  30-45  minutes a day, 3-4 days a week. Walking is especially valuable in preventing Osteoporosis. Eat a low-fat diet with lots of fruits and vegetables, up to 7-9 servings per day. Consume less than 30 grams of sugar per day from foods & drinks with High Fructose Corn Syrup as # 1,2,3 or #4 on label.

## 2011-03-29 NOTE — Progress Notes (Signed)
  Subjective:    Patient ID: Karen Evans, female    DOB: 05/06/1951, 60 y.o.   MRN: [id number]  HPI  Karen Evans  is here for a physical; she presently has no acute issues.      Review of Systems 03/09/11 she was seen at the urgent care with hypertension, nausea and vomiting, and dehydration. She improved with IV fluids; labs done that day are not in the electronic medical record. 02/18/2011 labs were reviewed. Fasting blood sugar was 101. Her TSH was 0.5. Lipids and vitamin D level need to be rechecked     Objective:   Physical Exam  Gen.: Healthy and well-nourished in appearance. Alert, appropriate and cooperative throughout exam. Head: Normocephalic without obvious abnormalities  Eyes: No corneal or conjunctival inflammation noted. Pupils equal round reactive to light and accommodation. Fundal exam evaluation decreased OD( Note: no PMH cataracts as per Dr Hazle Quant).OS fundus benign without hemorrhages, exudate, papilledema. Extraocular motion intact. Vision grossly normal. Ears: External  ear exam reveals no significant lesions or deformities. Canals clear .TMs normal. Hearing is grossly normal bilaterally. Nose: External nasal exam reveals no deformity or inflammation. Nasal mucosa are dry w/o lesions or exudates noted.   Mouth: Oral mucosa and oropharynx reveal no lesions or exudates. Teeth in good repair. Neck: No deformities, masses, or tenderness noted. Range of motion & . Thyroid normal. Lungs: Normal respiratory effort; chest expands symmetrically. Lungs are clear to auscultation without rales, wheezes, or increased work of breathing. Heart: Normal rate and rhythm. Normal S1 and S2. No gallop, click, or rub. S4 w/o  murmur. Abdomen: Bowel sounds normal; abdomen soft and nontender. No masses, organomegaly or hernias noted. Genitalia: Dr Cherly Hensen   .                                                                                   Musculoskeletal/extremities: No deformity or scoliosis noted of   the thoracic or lumbar spine. No clubbing, cyanosis, edema, or deformity noted. Range of motion  normal .Tone & strength  normal.Joints normal. Nail health  good. Vascular: Carotid, radial artery, dorsalis pedis and  posterior tibial pulses are full and equal. No bruits present. Neurologic: Alert and oriented x3. Deep tendon reflexes symmetrical and normal.          Skin: Intact without suspicious lesions or rashes. Lymph: No cervical, axillary lymphadenopathy present. Psych: Mood and affect are normal. Normally interactive                                                                                         Assessment & Plan:  #1 comprehensive physical exam; no acute findings #2 see Problem List with Assessments & Recommendations Plan: see Orders

## 2011-03-30 LAB — LIPID PANEL
Total CHOL/HDL Ratio: 3
Triglycerides: 95 mg/dL (ref 0.0–149.0)
VLDL: 19 mg/dL (ref 0.0–40.0)

## 2011-03-30 LAB — VITAMIN D 25 HYDROXY (VIT D DEFICIENCY, FRACTURES): Vit D, 25-Hydroxy: 48 ng/mL (ref 30–89)

## 2011-03-31 LAB — LDL CHOLESTEROL, DIRECT: Direct LDL: 163.2 mg/dL

## 2011-07-20 ENCOUNTER — Telehealth: Payer: Self-pay | Admitting: Gastroenterology

## 2011-07-20 NOTE — Telephone Encounter (Signed)
Pt reports she had diarrhea this am, but it has cleared now. She had a problems in the past with Ileitis and needed Entocort therapy. Also has a hx of GERD, Lactose Def., and Reflux. Pt reports she saw Dr Cherly Hensen and she has a UTI; gyn has ordered Cipro and [facility]. Informed pt if her diarrhea has stopped, pay attention when she starts the Cipro and see if diarrhea develops again and call us. Have some Imodium on hand in diarrhea develops in the night. The [facility] should help prevent diarrhea, but she should call us if it develops; pt stated understanding.

## 2011-07-21 ENCOUNTER — Telehealth: Payer: Self-pay | Admitting: Gastroenterology

## 2011-07-21 NOTE — Telephone Encounter (Signed)
Left samples out front for patient to pick up.

## 2011-08-05 ENCOUNTER — Other Ambulatory Visit: Payer: Self-pay | Admitting: Internal Medicine

## 2011-08-05 NOTE — Telephone Encounter (Signed)
Refill done.  

## 2011-08-28 ENCOUNTER — Ambulatory Visit (INDEPENDENT_AMBULATORY_CARE_PROVIDER_SITE_OTHER): Payer: 59 | Admitting: Family Medicine

## 2011-08-28 VITALS — BP 128/80 | HR 74 | Temp 97.6°F | Wt 147.0 lb

## 2011-08-28 DIAGNOSIS — J301 Allergic rhinitis due to pollen: Secondary | ICD-10-CM

## 2011-08-28 DIAGNOSIS — J040 Acute laryngitis: Secondary | ICD-10-CM

## 2011-08-28 MED ORDER — BENZONATATE 100 MG PO CAPS: 100.0000 mg | ORAL_CAPSULE | Freq: Three times a day (TID) | ORAL | Status: AC | PRN

## 2011-08-28 MED ORDER — FLUTICASONE PROPIONATE 50 MCG/ACT NA SUSP: 2.0000 | Freq: Every day | NASAL | Status: DC

## 2011-08-28 NOTE — Progress Notes (Signed)
  Patient Name: Karen Evans Date of Birth: 1951-04-22 Age: 60 y.o. Medical Record Number: [id number] Gender: female Date of Encounter: 08/28/2011  History of Present Illness:  Karen Evans is a 61 y.o. very pleasant female patient who presents with the following:  Has been hoarse ever since Tuesday, and has been drinking a lot of water and has been using a lot of cough syrup but it has not been doig well. Something has been suckig.  5 days.   No fever, chills, or sweats. Has been taking some allegra, but has only been taking a half a pill.  Coughing some yellow mucous up.  A lot of drainage behind the patient's eyes.   Also has some significant acid reflux.  One time was given some cough pills  Past Medical History, Surgical History, Social History, Family History, Problem List, Medications, and Allergies have been reviewed and updated if relevant.  Review of Systems: ROS: GEN: Acute illness details above GI: Tolerating PO intake GU: maintaining adequate hydration and urination Pulm: No SOB Interactive and getting along well at home.  Otherwise, ROS is as per the HPI.   Physical Examination: Filed Vitals:   08/28/11 1202  BP: 128/80  Pulse: 74  Temp: 97.6 F (36.4 C)  TempSrc: Oral  Weight: 147 lb (66.679 kg)  SpO2: 98%    There is no height on file to calculate BMI.   Gen: WDWN, NAD; A & O x3, cooperative. Pleasant.Globally Non-toxic HEENT: Normocephalic and atraumatic. Throat clear, w/o exudate, R TM clear, L TM - good landmarks, No fluid present. rhinnorhea.  MMM Frontal sinuses: NT Max sinuses: NT NECK: Anterior cervical  LAD is absent CV: RRR, No M/G/R, cap refill <2 sec PULM: Breathing comfortably in no respiratory distress. no wheezing, crackles, rhonchi EXT: No c/c/e PSYCH: Friendly, good eye contact MSK: Nml gait    Assessment and Plan:   1. Laryngitis   2. Allergic rhinitis due to pollen    Likely acute laryngitis and worsened AR Treat  symptomatically  Orders Today: No orders of the defined types were placed in this encounter.    Medications Today: Meds ordered this encounter  Medications  . fluticasone (FLONASE) 50 MCG/ACT nasal spray    Sig: Place 2 sprays into the nose daily.    Dispense:  16 g    Refill:  3  . benzonatate (TESSALON) 100 MG capsule    Sig: Take 1 capsule (100 mg total) by mouth 3 (three) times daily as needed for cough.    Dispense:  40 capsule    Refill:  0

## 2011-09-03 ENCOUNTER — Telehealth: Payer: Self-pay | Admitting: Internal Medicine

## 2011-09-03 NOTE — Telephone Encounter (Signed)
Caller: Milli/Patient; PCP: Marga Melnick; CB#: [phone]; ; ; Call regarding Cough/Congestion;  Patient states she was seen at the [facility] office 08/28/11 for hoarseness, cough, congestion. States was prescribed Flonase. Patient states she was told by Dr. Patsy Lager that a Rx for cough medication was going to be sent to Pharmacy. States he only received Flonase.  Patient states she continues to have cough. States expectorating clear sputum. States continues to have clear nasal drainage. Denies wheezing. Afebrile. Patient taking fluids well. Triage per Cough Protocol. No emergent sx identified. Care advice given per guidelines. Patient advised increased fluids, saline nasal washes/netty pot, warm fluids with honey, inhaled steam, humidifier.  RN verified in Epic EHR that patient was prescribed: Benzonatate (TESSALON) 100 MG capsule; Dispense # 40 capsule; no refills on 09/04/2011    Take 1 capsule (100 mg total) by mouth 3 (three) times daily as needed for cough. - Oral  RN spoke with Denies at [facility] on [address] @ [phone]. States Rx order for Flonase was received but Rx order for Tessalon was not received. Above Rx order given to Pharamcist/Joe at above [facility]. Patient informed of above. Call back parameters reviewed. Patient verbalizes understanding.

## 2011-09-14 ENCOUNTER — Ambulatory Visit (INDEPENDENT_AMBULATORY_CARE_PROVIDER_SITE_OTHER): Payer: 59 | Admitting: Internal Medicine

## 2011-09-14 ENCOUNTER — Encounter: Payer: Self-pay | Admitting: Internal Medicine

## 2011-09-14 VITALS — BP 122/80 | HR 81 | Temp 98.3°F | Wt 146.0 lb

## 2011-09-14 DIAGNOSIS — I10 Essential (primary) hypertension: Secondary | ICD-10-CM

## 2011-09-14 DIAGNOSIS — R05 Cough: Secondary | ICD-10-CM

## 2011-09-14 DIAGNOSIS — R059 Cough, unspecified: Secondary | ICD-10-CM

## 2011-09-14 DIAGNOSIS — H109 Unspecified conjunctivitis: Secondary | ICD-10-CM

## 2011-09-14 DIAGNOSIS — H1010 Acute atopic conjunctivitis, unspecified eye: Secondary | ICD-10-CM

## 2011-09-14 MED ORDER — LOSARTAN POTASSIUM 100 MG PO TABS: ORAL_TABLET | ORAL | Status: DC

## 2011-09-14 NOTE — Progress Notes (Signed)
  Subjective:    Patient ID: Karen Evans, female    DOB: 11/16/1950, 61 y.o.   MRN: [id number]  HPI During the first week of April she developed rhinitis, watery eyes, and sneezing. She took  Allegra 1/2 pill twice a day with some benefit. Subsequently she developed laryngitis and cough. She was prescribed Flonase and Tessalon at the urgent care with some benefit.  She denies fever or chills. She is menopausal he related sweats.  Her cough is variably dry to productive of clear sputum. She is on benazepril  Past medical history/family history/social history were all reviewed and updated.     Review of Systems She denies earache or discharge, frontal headache, facial pain, nasal purulence, or sore throat.  Reflux is essentially quiescent on Aciphex daily     Objective:   Physical Exam General appearance:good health ;well nourished; no acute distress or increased work of breathing is present.  No  lymphadenopathy about the head, neck, or axilla noted.   Eyes: No conjunctival inflammation or lid edema is present. .  Ears:  External ear exam shows no significant lesions or deformities.  Otoscopic examination reveals clear canals, tympanic membranes are intact bilaterally without bulging, retraction, inflammation or discharge.  Nose:  External nasal examination shows no deformity or inflammation. Nasal mucosa are dry without lesions or exudates. No septal dislocation or deviation.No obstruction to airflow.   Oral exam: Dental hygiene is good; lips and gums are healthy appearing.There is no oropharyngeal erythema or exudate noted.    Neck:  No deformities, thyromegaly, masses, or tenderness noted.   Supple with full range of motion without pain.   Heart:  Normal rate and regular rhythm. S1 and S2 normal without gallop, murmur, click, rub or other extra sounds.   Lungs:Chest clear to auscultation; no wheezes, rhonchi,rales ,or rubs present.No increased work of breathing.     Extremities:  No cyanosis, edema, or clubbing  noted    Skin: Warm & dry            Assessment & Plan:  #1 cough in the context of ACE inhibitor therapy  #2 extrinsic symptoms improved  Plan: See orders and recommendations

## 2011-09-14 NOTE — Patient Instructions (Signed)
Plain Mucinex for thick secretions ;force NON dairy fluids . Use a Neti pot daily as needed for sinus congestion; going from open side to congested side . Nasal cleansing in the shower as discussed. Make sure that all residual soap is removed to prevent irritation. Fluticasone 1 spray in each nostril twice a day as needed. Use the "crossover" technique as discussed. Plain Allegra 160 daily as needed for itchy eyes & sneezing. Blood Pressure Goal  Ideally is an AVERAGE < 135/85. This AVERAGE should be calculated from @ least 5-7 BP readings taken @ different times of day on different days of week. You should not respond to isolated BP readings , but rather the AVERAGE for that week

## 2011-10-29 ENCOUNTER — Telehealth: Payer: Self-pay | Admitting: Internal Medicine

## 2011-10-29 NOTE — Telephone Encounter (Signed)
EMERGENT CALL: THE PATIENT REFUSED 911;  Caller: [name]; PCP: Marga Melnick; CB#: [phone];  Call regarding Death of Brother and now her BP is high; sx started 10/29/11;  BP 130/84 at 12:30pm; feeling dizzy earlier today; no dizziness now; has a couple elevated BP readings this week but pt states it was from eating [employer]; Triaged per Hypertension, Diagnosed or Suspected Guideline; See in 72 hr d/t multiple elevated readings; will comply; will call back if sx worsen or change; will call Monday for an appt if sx do not worsen

## 2011-11-02 ENCOUNTER — Telehealth: Payer: Self-pay | Admitting: Internal Medicine

## 2011-11-02 NOTE — Telephone Encounter (Signed)
Caller: Danique/Patient; PCP: Marga Melnick; CB#: [phone]; Call regarding BP =140/95 @ 1250  and 141/99 at 1515- 11/02/11.  She just started Losartan 100 mgs 1 PO QD  on 09/10/11, She took her dose today 0900 and started feeling dizzy at 1515. She had not eaten she felt bettter after ate and drink water. Triage and Care advice per Dizziness and HTN Protocols and appnt advised within 24 hours for " previously evaluated and worsening symptoms interfering with abiltiy to carry out activities of daily living." She had to call out of work today. Appnt sched for 11/03/11 @ 1230. She is leaving on 11/05/11 to go out of town and is wanting to make sure blood presure is okay.

## 2011-11-03 ENCOUNTER — Encounter: Payer: Self-pay | Admitting: Internal Medicine

## 2011-11-03 ENCOUNTER — Ambulatory Visit (INDEPENDENT_AMBULATORY_CARE_PROVIDER_SITE_OTHER): Payer: 59 | Admitting: Internal Medicine

## 2011-11-03 VITALS — BP 120/78 | HR 77 | Temp 98.4°F | Wt 144.0 lb

## 2011-11-03 DIAGNOSIS — I10 Essential (primary) hypertension: Secondary | ICD-10-CM

## 2011-11-03 NOTE — Patient Instructions (Addendum)
Grief counseling may be available for the church. Blood Pressure Goal  Ideally is an AVERAGE < 135/85. This AVERAGE should be calculated from @ least 5-7 BP readings taken @ different times of day on different days of week. You should not respond to isolated BP readings , but rather the AVERAGE for that week

## 2011-11-03 NOTE — Progress Notes (Signed)
  Subjective:    Patient ID: Karen Evans, female    DOB: 1950-09-28, 61 y.o.   MRN: [id number]  HPI  HYPERTENSION:BP elevated @work   Disease Monitoring  Blood pressure range: not monitored @ home since brother's death  11-20-11 of DM complications  Chest pain: no   Dyspnea: some this am   Claudication: no   Medication compliance: Losartan 100 mg  Medication Side Effects  Lightheadedness: since  11-20-11   Urinary frequency: no   Edema: no    Preventitive Healthcare:  Exercise: no   Diet Pattern:no  Salt Restriction: sometimes      Review of Systems she states that the depression is not persistent or progressive it is not affecting function     Objective:   Physical Exam She appears healthy and well-nourished;s he is in no acute distress  Extraocular motion intact; no nystagmus present  No carotid bruits are present.  Heart rhythm and rate are normal with no  gallops. S4 noted; grade 1/2 systolic murmur left sternal were when supine  Chest is clear with no increased work of breathing  There is no evidence of aortic aneurysm or renal artery bruits  She has no clubbing or edema.   Pedal pulses are intact   Neuro: Deep tendon reflexes equal; 1/2+ at knees. Strength and tone good. Romberg testing and finger to nose testing normal  No ischemic skin changes are present   Psych: Clinically no evidence of depression or anxiety        Assessment & Plan:

## 2011-12-16 ENCOUNTER — Telehealth: Payer: Self-pay | Admitting: Gastroenterology

## 2011-12-16 NOTE — Telephone Encounter (Signed)
Pt states she thinks she is eating too much ice cream and soda. She used to have a problem having BMs, but they have advanced from hard to loose and the last time the BM was watery with particles; she is having 3-4 daily. She denies blood or fever; just hot flashes. She asked if she could try [facility] as last year and i instructed her to use Lactaid when she ate ice cream. Pt stated understanding and will call with an update.

## 2011-12-16 NOTE — Telephone Encounter (Signed)
lmom for pt to call back. Hx of Crohn's Colitis, GERD, lactose intolerance; last COLON 10/24/7 was normal.

## 2012-01-08 ENCOUNTER — Encounter: Payer: Self-pay | Admitting: Family Medicine

## 2012-01-08 ENCOUNTER — Ambulatory Visit (INDEPENDENT_AMBULATORY_CARE_PROVIDER_SITE_OTHER): Payer: 59 | Admitting: Family Medicine

## 2012-01-08 DIAGNOSIS — J209 Acute bronchitis, unspecified: Secondary | ICD-10-CM

## 2012-01-08 DIAGNOSIS — J069 Acute upper respiratory infection, unspecified: Secondary | ICD-10-CM

## 2012-01-08 MED ORDER — LEVOFLOXACIN 500 MG PO TABS: 500.0000 mg | ORAL_TABLET | Freq: Every day | ORAL | Status: AC

## 2012-01-08 NOTE — Progress Notes (Signed)
OFFICE NOTE  01/08/2012  CC: No chief complaint on file.    HPI: Patient is a 61 y.o. African-American female who is here for 1 wk hx of nasal congestion, sneezing, runny nose, coughing productive of yellow sputum until this morning when some blood tinged sputum came up.  She has had no HA or fevers.  No facial pain or pressure.   She does not smoke.  Pertinent PMH:  Past Medical History  Diagnosis Date  . Anemia     PMH of   . GERD (gastroesophageal reflux disease)     Barrett's esophagus  . Fibroid, uterine     Dr Cherly Hensen; G 0 P 0  . Crohn's     ileitis/mild  . Personal history of colonic polyps 2007  . Hypertension   . Goiter     CTS,RUE > LUE  . Vitamin d deficiency     MEDS:  Outpatient Prescriptions Prior to Visit  Medication Sig Dispense Refill  . fluticasone (FLONASE) 50 MCG/ACT nasal spray Place 2 sprays into the nose daily.  16 g  3  . losartan (COZAAR) 100 MG tablet Take this in place of benazepril; this does not cause cough  30 tablet  5  . RABEprazole (ACIPHEX) 20 MG tablet Take 1 tablet (20 mg total) by mouth daily.  30 tablet  11  Allegra 180 mg qd  PE: There were no vitals taken for this visit. VS: noted--normal. Gen: alert, NAD, NONTOXIC APPEARING. HEENT: eyes without injection, drainage, or swelling.  Ears: EACs clear, TMs with normal light reflex and landmarks.  Nose: Clear rhinorrhea, with some dried, crusty exudate adherent to mildly injected mucosa.  No purulent d/c.  No paranasal sinus TTP.  No facial swelling.  Throat and mouth without focal lesion.  No pharyngial swelling, erythema, or exudate.   Neck: supple, no LAD.   LUNGS: CTA bilat, nonlabored resps.   CV: RRR, no m/r/g. EXT: no c/c/e SKIN: no rash    IMPRESSION AND PLAN:  Acute URI/bronchitis, prolonged. Will rx levaquin x 7d. Continue flonase and nonsedating antihistamine.   Also, add robitussin DM.  FOLLOW UP: prn

## 2012-01-12 ENCOUNTER — Encounter: Payer: Self-pay | Admitting: *Deleted

## 2012-01-14 ENCOUNTER — Other Ambulatory Visit (INDEPENDENT_AMBULATORY_CARE_PROVIDER_SITE_OTHER): Payer: 59

## 2012-01-14 ENCOUNTER — Encounter: Payer: Self-pay | Admitting: Gastroenterology

## 2012-01-14 ENCOUNTER — Telehealth: Payer: Self-pay | Admitting: Internal Medicine

## 2012-01-14 ENCOUNTER — Ambulatory Visit (INDEPENDENT_AMBULATORY_CARE_PROVIDER_SITE_OTHER): Payer: 59 | Admitting: Gastroenterology

## 2012-01-14 VITALS — BP 120/66 | HR 76 | Ht 62.5 in | Wt 148.2 lb

## 2012-01-14 DIAGNOSIS — K219 Gastro-esophageal reflux disease without esophagitis: Secondary | ICD-10-CM

## 2012-01-14 DIAGNOSIS — K5909 Other constipation: Secondary | ICD-10-CM

## 2012-01-14 DIAGNOSIS — K2270 Barrett's esophagus without dysplasia: Secondary | ICD-10-CM

## 2012-01-14 DIAGNOSIS — K5090 Crohn's disease, unspecified, without complications: Secondary | ICD-10-CM

## 2012-01-14 DIAGNOSIS — R195 Other fecal abnormalities: Secondary | ICD-10-CM

## 2012-01-14 DIAGNOSIS — K5900 Constipation, unspecified: Secondary | ICD-10-CM

## 2012-01-14 LAB — BASIC METABOLIC PANEL
BUN: 14 mg/dL (ref 6–23)
GFR: 111.94 mL/min (ref >60.00)
Glucose, Bld: 63 mg/dL — ABNORMAL LOW (ref 70–99)
Potassium: 4.3 mEq/L (ref 3.5–5.1)

## 2012-01-14 LAB — VITAMIN B12: Vitamin B-12: 947 pg/mL — ABNORMAL HIGH (ref 211–911)

## 2012-01-14 LAB — CBC WITH DIFFERENTIAL/PLATELET
Eosinophils Relative: 1.8 % (ref 0.0–5.0)
HCT: 38.9 % (ref 36.0–46.0)
Lymphs Abs: 2.3 10*3/uL (ref 0.7–4.0)
Monocytes Relative: 10.8 % (ref 3.0–12.0)
Neutrophils Relative %: 59.9 % (ref 43.0–77.0)
Platelets: 278 10*3/uL (ref 150.0–400.0)
WBC: 8.6 10*3/uL (ref 4.5–10.5)

## 2012-01-14 LAB — HEPATIC FUNCTION PANEL
AST: 20 U/L (ref 0–37)
Albumin: 3.6 g/dL (ref 3.5–5.2)

## 2012-01-14 LAB — IBC PANEL
Iron: 71 ug/dL (ref 42–145)
Saturation Ratios: 21.3 % (ref 20.0–50.0)
Transferrin: 238.5 mg/dL (ref 212.0–360.0)

## 2012-01-14 LAB — TSH: TSH: 0.36 u[IU]/mL (ref 0.35–5.50)

## 2012-01-14 LAB — SEDIMENTATION RATE: Sed Rate: 25 mm/hr — ABNORMAL HIGH (ref 0–22)

## 2012-01-14 MED ORDER — PEG-KCL-NACL-NASULF-NA ASC-C 100 G PO SOLR: 1.0000 | Freq: Once | ORAL | Status: DC

## 2012-01-14 NOTE — Progress Notes (Signed)
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 She is doing fairly well on daily PPI therapy. She also has a history of underlying Crohn's disease with associated chronic functional constipation and occasional diarrhea. Her last endoscopy and colonoscopy were 6 years ago. She continues to complain of irregular bowel habits, rectal discomfort, but his had no true rectal bleeding. She follows a regular diet has not had any specific food intolerances, anorexia or weight loss. She is currently undergoing gynecologic examination with Dr.Cousins, and has planned vaginal ultrasound.  Current Medications, Allergies, Past Medical History, Past Surgical History, Family History and Social History were reviewed in [employer] record.  Pertinent Review of Systems Negative   Physical Exam: Healthy-appearing patient in no acute distress. Blood pressure 120/66, pulse 76 and regular, and weight 148 pounds the BMI of 26.68. I cannot appreciate stigmata of chronic liver disease. Chest is clear and she appears to be in a regular rhythm without murmurs gallops or rubs. There is no is no distention, organomegaly, masses or tenderness. Bowel sounds are nonobstructive. Peripheral extremities are unremarkable mental status is normal. Rectal exam shows some slight anal stenosis without a definite mass or tenderness. There is all stool present which is guaiac positive.    Assessment and Plan: Chronic functional constipation in a patient with very mild underlying right-sided Crohn's disease. I have set her up for followup colonoscopy and endoscopy, also we will check her labs which have not been done in the last year. She is to continue her medicines as listed above until her workup is been completed. Anemia profile also ordered. Encounter Diagnoses  Name Primary?  . Barrett's esophagus Yes  . Crohn disease   . GERD (gastroesophageal reflux  disease)   . Heme positive stool

## 2012-01-14 NOTE — Patient Instructions (Addendum)
You have been scheduled for an endoscopy and colonoscopy with propofol. Please follow the written instructions given to you at your visit today. Please pick up your prep at the pharmacy within the next 1-3 days. If you use inhalers (even only as needed), please bring them with you on the day of your procedure. Your physician has requested that you go to the basement for  lab work before leaving today:  CC:Karen Evans, M. D.

## 2012-01-14 NOTE — Telephone Encounter (Signed)
° °  Caller: [name]/Patient; Patient Name: Karen Evans; PCP: Marga Melnick; Best Callback Phone Number: [phone].  Patient calling that she ahs been taking Levofloxacin 500 mg 1 by mouth every day since 01/08/12 for sore thorat and sinusitis.  She is calling to see if she needs more medicaiton.  No longer has a sore throat, but she can tell her voice is changing, getting slihgtly hoarse.  Afebrile.  Triaged Sore Throat or Hoarseness and all emergent symptoms ruled out.  Disposition is to provide home care instruction and these along with call back parameters given.

## 2012-01-18 ENCOUNTER — Encounter: Payer: Self-pay | Admitting: Gastroenterology

## 2012-01-24 ENCOUNTER — Encounter: Payer: Self-pay | Admitting: Gastroenterology

## 2012-01-24 ENCOUNTER — Ambulatory Visit (AMBULATORY_SURGERY_CENTER): Payer: 59 | Admitting: Gastroenterology

## 2012-01-24 VITALS — BP 181/104 | HR 102 | Temp 97.5°F | Resp 20 | Ht 62.5 in | Wt 148.0 lb

## 2012-01-24 DIAGNOSIS — R1013 Epigastric pain: Secondary | ICD-10-CM

## 2012-01-24 DIAGNOSIS — R195 Other fecal abnormalities: Secondary | ICD-10-CM

## 2012-01-24 DIAGNOSIS — D126 Benign neoplasm of colon, unspecified: Secondary | ICD-10-CM

## 2012-01-24 DIAGNOSIS — K5010 Crohn's disease of large intestine without complications: Secondary | ICD-10-CM

## 2012-01-24 DIAGNOSIS — R109 Unspecified abdominal pain: Secondary | ICD-10-CM

## 2012-01-24 DIAGNOSIS — K621 Rectal polyp: Secondary | ICD-10-CM

## 2012-01-24 DIAGNOSIS — K222 Esophageal obstruction: Secondary | ICD-10-CM

## 2012-01-24 DIAGNOSIS — K5090 Crohn's disease, unspecified, without complications: Secondary | ICD-10-CM

## 2012-01-24 DIAGNOSIS — D129 Benign neoplasm of anus and anal canal: Secondary | ICD-10-CM

## 2012-01-24 DIAGNOSIS — K219 Gastro-esophageal reflux disease without esophagitis: Secondary | ICD-10-CM

## 2012-01-24 DIAGNOSIS — K2270 Barrett's esophagus without dysplasia: Secondary | ICD-10-CM

## 2012-01-24 DIAGNOSIS — D128 Benign neoplasm of rectum: Secondary | ICD-10-CM

## 2012-01-24 HISTORY — DX: Rectal polyp: K62.1

## 2012-01-24 MED ORDER — SODIUM CHLORIDE 0.9 % IV SOLN: 500.0000 mL | INTRAVENOUS | Status: DC

## 2012-01-24 NOTE — Patient Instructions (Addendum)
YOU HAD AN ENDOSCOPIC PROCEDURE TODAY AT THE [facility]: Refer to the procedure report that was given to you for any specific questions about what was found during the examination.  If the procedure report does not answer your questions, please call your gastroenterologist to clarify.  If you requested that your care partner not be given the details of your procedure findings, then the procedure report has been included in a sealed envelope for you to review at your convenience later.  YOU SHOULD EXPECT: Some feelings of bloating in the abdomen. Passage of more gas than usual.  Walking can help get rid of the air that was put into your GI tract during the procedure and reduce the bloating. If you had a lower endoscopy (such as a colonoscopy or flexible sigmoidoscopy) you may notice spotting of blood in your stool or on the toilet paper. If you underwent a bowel prep for your procedure, then you may not have a normal bowel movement for a few days.  DIET: Please have nothing by mouth for 1 hour.   Then, you need to stay on a soft diet for the entire day TODAY.  Tomorrow, you may eat what you would like.  ACTIVITY: Your care partner should take you home directly after the procedure.  You should plan to take it easy, moving slowly for the rest of the day.  You can resume normal activity the day after the procedure however you should NOT DRIVE or use heavy machinery for 24 hours (because of the sedation medicines used during the test).    SYMPTOMS TO REPORT IMMEDIATELY: A gastroenterologist can be reached at any hour.  During normal business hours, 8:30 AM to 5:00 PM Monday through Friday, call [phone].  After hours and on weekends, please call the GI answering service at [phone] who will take a message and have the physician on call contact you.   Following lower endoscopy (colonoscopy or flexible sigmoidoscopy):  Excessive amounts of blood in the stool  Significant tenderness  or worsening of abdominal pains  Swelling of the abdomen that is new, acute  Fever of 100F or higher  Following upper endoscopy (EGD)  Vomiting of blood or coffee ground material  New chest pain or pain under the shoulder blades  Painful or persistently difficult swallowing  New shortness of breath  Fever of 100F or higher  Black, tarry-looking stools  FOLLOW UP: If any biopsies were taken you will be contacted by phone or by letter within the next 1-3 weeks.  Call your gastroenterologist if you have not heard about the biopsies in 3 weeks.  Our staff will call the home number listed on your records the next business day following your procedure to check on you and address any questions or concerns that you may have at that time regarding the information given to you following your procedure. This is a courtesy call and so if there is no answer at the home number and we have not heard from you through the emergency physician on call, we will assume that you have returned to your regular daily activities without incident.  SIGNATURES/CONFIDENTIALITY: You and/or your care partner have signed paperwork which will be entered into your electronic medical record.  These signatures attest to the fact that that the information above on your After Visit Summary has been reviewed and is understood.  Full responsibility of the confidentiality of this discharge information lies with you and/or your care-partner.   Thank-you for  choosing [place] for your medical needs.

## 2012-01-24 NOTE — Op Note (Signed)
 [facility] [address] [place], [address]   COLONOSCOPY PROCEDURE REPORT  PATIENT: Karen Evans, Karen Evans  MR#: [id number] BIRTHDATE: 11/28/1950 , 61  yrs. old GENDER: Female ENDOSCOPIST: Mardella Layman, MD, [facility] REFERRED BY: PROCEDURE DATE:  01/24/2012 PROCEDURE:   Colonoscopy with snare polypectomy and Colonoscopy with biopsy ASA CLASS:   Class II INDICATIONS:patient's personal history of colon polyps and high risk patient with previously diagnosed Crohn's disease: large intestine.  MEDICATIONS: propofol (Diprivan) 400mg  IV  DESCRIPTION OF PROCEDURE:   After the risks and benefits and of the procedure were explained, informed consent was obtained.  A digital rectal exam revealed a normal rectal and perianal exam.    The LB PCF-H180AL [id number]  endoscope was introduced through the anus and advanced to the terminal ileum which was intubated for a short distance .  The quality of the prep was excellent, using MoviPrep . The instrument was then slowly withdrawn as the colon was fully examined.     COLON FINDINGS: A diminutive sessile polyp was found in the ascending colon.  A biopsy was performed using a cold snare.   A smooth flat polyp ranging between 3-55mm in size was found in the sigmoid colon.  A polypectomy was performed with a cold snare.  The resection was complete and the polyp tissue was completely retrieved.   NODULAR RECTUM BIOPSIED,NO EROSIONS OR FRIABLLITY. The mucosa appeared normal in the terminal ileum.   The colon mucosa was otherwise normal. n]     The scope was then withdrawn from the patient and the procedure completed.COMPLICATIONS: There were no complications.  ENDOSCOPIC IMPRESSION:;R/O ADENOMAS,NO ACTIVE IBD.  RECOMMENDATIONS: 1.  await pathology results 2.  continue current medications   REPEAT EXAM:  cc:Pecola Lawless, MD  _______________________________ eSigned:  Mardella Layman, MD, [facility] 01/24/2012 11:23  AM

## 2012-01-24 NOTE — Progress Notes (Addendum)
Patient did not have preoperative order for IV antibiotic SSI prophylaxis. (G8918)  Patient did not experience any of the following events: a burn prior to discharge; a fall within the facility; wrong site/side/patient/procedure/implant event; or a hospital transfer or hospital admission upon discharge from the facility. (G8907)  

## 2012-01-24 NOTE — Op Note (Signed)
[facility] [address] [place], [address]   ENDOSCOPY PROCEDURE REPORT  PATIENT: Karen Evans, Karen Evans  MR#: [id number] BIRTHDATE: 08/30/1950 , 61  yrs. old GENDER: Female ENDOSCOPIST:Cashus Halterman Hale Bogus, MD, [facility] REFERRED BY: PROCEDURE DATE:  01/24/2012 PROCEDURE:   EGD, diagnostic and Maloney dilation of esophagus ASA CLASS:    Class II INDICATIONS: history of esophageal reflux and dysphagia. MEDICATION: There was residual sedation effect present from prior procedure and propofol (Diprivan) 100mg  IV TOPICAL ANESTHETIC:  DESCRIPTION OF PROCEDURE:   After the risks and benefits of the procedure were explained, informed consent was obtained.  The LB GIF-H180 [id number]  endoscope was introduced through the mouth  and advanced to the second portion of the duodenum .  The instrument was slowly withdrawn as the mucosa was fully examined.    The upper, middle and distal third of the esophagus were carefully inspected and no abnormalities were noted.  The z-line was well seen at the GEJ.  The endoscope was pushed into the fundus which was normal including a retroflexed view.  The antrum, gastric body, first and second part of the duodenum were unremarkable.   SMALL HH NOTED...DILATED #2F MALONEY DILATOR,NO HEME OR PAIN.   SMALL HH NOTED...DILATED #2F MALONEY DILATOR,NO HEME OR PAIN. Retroflexed views revealed no abnormalities.    The scope was then withdrawn from the patient and the procedure completed.  COMPLICATIONS: There were no complications.   ENDOSCOPIC IMPRESSION: 1.   Normal EGD 2.   SMALL HH NOTED...DILATED #2F MALONEY DILATOR,NO HEME OR PAIN...  RECOMMENDATIONS: 1.  continue current medications 2.  continue PPI 3.  dilatations PRN    _______________________________ eSignedMardella Layman, MD, [facility] 01/24/2012 11:29 AM

## 2012-01-25 ENCOUNTER — Telehealth: Payer: Self-pay

## 2012-01-25 NOTE — Telephone Encounter (Signed)
Unable to leave message. Machine would not allow.

## 2012-01-28 ENCOUNTER — Encounter: Payer: Self-pay | Admitting: Gastroenterology

## 2012-02-09 ENCOUNTER — Other Ambulatory Visit: Payer: Self-pay | Admitting: Gastroenterology

## 2012-02-09 NOTE — Telephone Encounter (Signed)
Patient notified we no longer receive Aciphex samples and she will check with her pharmacy to see if she can get rx cheaper now.

## 2012-03-10 ENCOUNTER — Other Ambulatory Visit: Payer: Self-pay | Admitting: Gastroenterology

## 2012-03-10 ENCOUNTER — Other Ambulatory Visit: Payer: Self-pay | Admitting: Internal Medicine

## 2012-03-10 NOTE — Telephone Encounter (Signed)
Refill done.  

## 2012-03-13 ENCOUNTER — Encounter: Payer: Self-pay | Admitting: Internal Medicine

## 2012-03-14 ENCOUNTER — Encounter: Payer: Self-pay | Admitting: Internal Medicine

## 2012-03-14 ENCOUNTER — Other Ambulatory Visit: Payer: Self-pay | Admitting: Radiology

## 2012-03-30 ENCOUNTER — Ambulatory Visit (INDEPENDENT_AMBULATORY_CARE_PROVIDER_SITE_OTHER): Payer: 59 | Admitting: Internal Medicine

## 2012-03-30 ENCOUNTER — Encounter: Payer: Self-pay | Admitting: Internal Medicine

## 2012-03-30 VITALS — BP 130/82 | HR 73 | Temp 98.4°F | Resp 12 | Ht 63.0 in | Wt 150.4 lb

## 2012-03-30 DIAGNOSIS — Z0000 Encounter for general adult medical examination without abnormal findings: Secondary | ICD-10-CM

## 2012-03-30 DIAGNOSIS — E049 Nontoxic goiter, unspecified: Secondary | ICD-10-CM

## 2012-03-30 NOTE — Patient Instructions (Addendum)
Preventive Health Care: Exercise  30-45  minutes a day, 3-4 days a week. Walking is especially valuable in preventing Osteoporosis. Eat a low-fat diet with lots of fruits and vegetables, up to 7-9 servings per day.  Consume less than 30 grams of sugar per day from foods & drinks with High Fructose Corn Syrup as # 1,2,3 or #4 on label. Eye Doctor - have an eye exam @ least annually. Your BP goal = AVERAGE < 135/85. Avoid ingestion of  excess salt/sodium.Cook with pepper & other spices . Use the salt substitute "No Salt"(unless your potassium has been elevated) OR the Mrs Sharilyn Sites products to season food @ the table. Avoid foods which taste salty or "vinegary" as their sodium contentet will be high.  To prevent palpitations or premature beats, avoid stimulants such as decongestants, diet pills, nicotine, or caffeine (coffee, tea, cola, or chocolate) to excess.   If you activate My Chart; the results can be released to you as soon as they populate from the lab. If you choose not to use this program; the labs have to be reviewed, copied & mailed   causing a delay in getting the results to you.

## 2012-03-30 NOTE — Progress Notes (Signed)
Subjective:    Patient ID: Karen Evans, female    DOB: 03-12-1951, 61 y.o.   MRN: [id number]  HPI  She  is here for a physical;acute issues include arthralgias which respond to a  generic Tylenol product.      Review of Systems CHRONIC HYPERTENSION: Disease Monitoring  Blood pressure range: 128/68 - 220/140 @ work  Chest pain: no  Dyspnea:no  Claudication:no   Medication compliance: yes  Medication Side Effects  Lightheadedness: no   Urinary frequency: no  Edema: no   Preventitive Healthcare:  Exercise:walking & stairs  @ work    Diet Pattern: no plan  Salt Restriction: yes but eats salted snacks  She had extensive lab studies 01/14/12. Renal function was excellent;TSH  was 0.36.       Objective:   Physical Exam Gen.:  well-nourished in appearance. Alert, appropriate and cooperative throughout exam. Head: Normocephalic without obvious abnormalities  Eyes: No corneal or conjunctival inflammation noted. Pupils equal round reactive to light and accommodation. Fundal exam limited . Extraocular motion intact. Vision grossly normal with lenses. Ears: External  ear exam reveals no significant lesions or deformities. Canals clear .TMs normal. Hearing is grossly normal bilaterally. Nose: External nasal exam reveals no deformity or inflammation. Nasal mucosa are pink and moist. No lesions or exudates noted.  Mouth: Oral mucosa and oropharynx reveal no lesions or exudates. Teeth in good repair. Neck: No deformities, masses, or tenderness noted. Range of motion normal. Thyroid L lobe > R w/o nodules. Lungs: Normal respiratory effort; chest expands symmetrically. Lungs are clear to auscultation without rales, wheezes, or increased work of breathing. Heart: Normal rate and rhythm. Normal S1 and S2. No gallop, click, or rub. Grade 1/6 systolic murmur  Abdomen: Bowel sounds normal; abdomen soft and nontender. No masses, organomegaly or hernias noted. Genitalia: Dr Cherly Hensen, Gyn                                                                 Musculoskeletal/extremities: No deformity or scoliosis noted of  the thoracic or lumbar spine. No clubbing, cyanosis, edema, or deformity noted. Range of motion  normal .Tone & strength  normal.Joints normal. Nail health  good. Vascular: Carotid, radial artery, dorsalis pedis and  posterior tibial pulses are full and equal. No bruits present. Neurologic: Alert and oriented x3. Deep tendon reflexes symmetrical and normal.          Skin: Intact without suspicious lesions or rashes. Lymph: No cervical, axillary lymphadenopathy present. Psych: Mood and affect are normal. Normally interactive                                                                                         Assessment & Plan:  #1 comprehensive physical exam; no acute findings  #2 hypertension well-controlled at this time with normal renal function  #3 multinodular goiter; last assessment was 2009. TSH is low normal.  Plan: see Orders

## 2012-04-07 ENCOUNTER — Other Ambulatory Visit: Payer: 59

## 2012-04-07 ENCOUNTER — Ambulatory Visit
Admission: RE | Admit: 2012-04-07 | Discharge: 2012-04-07 | Disposition: A | Discharge status: Home / Self Care | Payer: 59 | Source: Ambulatory Visit | Attending: Internal Medicine | Admitting: Internal Medicine

## 2012-04-07 DIAGNOSIS — E049 Nontoxic goiter, unspecified: Secondary | ICD-10-CM

## 2012-07-11 ENCOUNTER — Other Ambulatory Visit: Payer: Self-pay | Admitting: Internal Medicine

## 2012-08-28 ENCOUNTER — Telehealth: Payer: Self-pay | Admitting: Gastroenterology

## 2012-08-28 NOTE — Telephone Encounter (Signed)
Pt reports pain between her ribs/breast above her stomach that began yesterday; she reports regular BMs. As the conversation went on, she finally stated she forgot her omeprazole and maybe she ate something she shouldn't have. When asked how she is feeling today, she reports better, but her stomach is tender. Offered pt an appt in am and she is undecided; she will call back tomorrow if she needs to be seen.

## 2012-08-28 NOTE — Telephone Encounter (Signed)
lmom for pt to call back. Hx of Barrett's, Crohn's, GERD, Heme + Stools, Chronic Constipation. Last seen 01/14/12 and ECL scheduled for 01/24/12. COLON with Adenomatous Polyps and EGD normal with dilation.

## 2012-10-05 ENCOUNTER — Telehealth: Payer: Self-pay | Admitting: General Practice

## 2012-10-05 NOTE — Telephone Encounter (Signed)
Pt called stating that since the pharmacy had changed the manufacturer they carry for the Losartan, pt states that she has been having some heart flutter with the new manufacturer. Pt says that pharmacy can switch her back to the previous manufacturer.    Will call pharmacy tomorrow to find out what the difference in medications are.

## 2012-10-06 NOTE — Telephone Encounter (Signed)
Pharmacy was called and stated they will revert pt back to old manufacturer.

## 2012-11-09 ENCOUNTER — Other Ambulatory Visit: Payer: Self-pay | Admitting: *Deleted

## 2012-11-09 ENCOUNTER — Encounter: Payer: Self-pay | Admitting: Gastroenterology

## 2012-11-09 ENCOUNTER — Ambulatory Visit (INDEPENDENT_AMBULATORY_CARE_PROVIDER_SITE_OTHER): Payer: 59 | Admitting: Gastroenterology

## 2012-11-09 VITALS — BP 120/80 | HR 73 | Ht 63.0 in | Wt 152.5 lb

## 2012-11-09 DIAGNOSIS — K589 Irritable bowel syndrome without diarrhea: Secondary | ICD-10-CM

## 2012-11-09 DIAGNOSIS — K219 Gastro-esophageal reflux disease without esophagitis: Secondary | ICD-10-CM

## 2012-11-09 DIAGNOSIS — K9049 Malabsorption due to intolerance, not elsewhere classified: Secondary | ICD-10-CM

## 2012-11-09 DIAGNOSIS — E739 Lactose intolerance, unspecified: Secondary | ICD-10-CM

## 2012-11-09 MED ORDER — RABEPRAZOLE SODIUM 20 MG PO TBEC: 20.0000 mg | DELAYED_RELEASE_TABLET | Freq: Every day | ORAL | Status: DC

## 2012-11-09 NOTE — Progress Notes (Signed)
This is a 62 year old African American female with constipation predominant IBS.  She's had colonoscopy within the last 2 years which is been unremarkable.  She continues with gas, bloating, and excessive flatus with mild constipation .Karen Evans  She has chronic GERD and with daily AcipHex 20 mg.  Her main complaint today is gas and bloating and some periodic diarrhea.  She has 9 lactose intolerance and also uses large amount of sorbitol and fructose with diet food products and gum.  She denies melena, hematochezia, severe abdominal pain, upper GI or hepatobiliary plates otherwise.  She is a history of multiple drug allergies.  There is no anorexia, weight loss, or systemic complaints.  Current Medications, Allergies, Past Medical History, Past Surgical History, Family History and Social History were reviewed in [employer] record.  ROS: All systems were reviewed and are negative unless otherwise stated in the HPI.          Physical Exam: Healthy patient in no distress.  Blood pressure 120/80, pulse 73 and regular and weight 152 with a BMI of 27.02.  I cannot appreciate stigmata of chronic liver disease.  Chest is clear and she is in a regular rhythm without murmurs gallops or rubs.  Abdomen shows no distention, organomegaly, masses or tenderness.  Bowel sounds are normal.    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  I've asked to use Lactaid supplemented dairy products because of a lactose intolerance, have placed her on a FOD-MAP diet for IBS, and have asked her to avoid nonabsorbable carbohydrates.  I have renewed her AcipHex for acid reflux, and we will see her on a when necessary basis as needed.  I do not think she needs repeat labs, endoscopy or colonoscopy at this time.  Please copy her primary care physician, referring physician, and pertinent  subspecialists. No diagnosis found.

## 2012-11-09 NOTE — Patient Instructions (Signed)
FOD MAP Diet given for your review Artificial Sweeteners Sheet given for your review  Information on Lactose Diet is below  New prescription for Aciphex was sent to your pharmacy ______________________________________________ Lactose-Free Diet Lactose is a carbohydrate that is found mainly in milk and milk products, as well as in foods with added milk or whey. Lactose must be digested by the enzyme lactase in order to be used by the body. Lactose intolerance occurs when there is a shortage of lactase. When your body is not able to digest lactose, you may feel sick to your stomach (nausea), bloated, and have cramps, gas, and diarrhea. TYPES OF LACTASE DEFICIENCY  Primary lactase deficiency. This is the most common type. It is characterized by a slow decrease in lactase activity.  Secondary lactase deficiency. This occurs following injury to the small intestinal mucosa as a result of a disease or condition. It can also occur as a result of surgery or after treatment with antibiotic medicines or cancer drugs. Tolerance to lactose varies widely. Each person must determine how much milk can be consumed without developing symptoms. Drinking smaller portions of milk throughout the day may be helpful. Some studies suggest that slowing gastric emptying may help increase tolerance of milk products. This may be done by:  Consuming milk or milk products with a meal rather than alone.  Consuming milk with a higher fat content. There are many dairy products that may be tolerated better than milk by some people, including:  Cheese (especially aged cheese). The lactose content is much lower than in milk.  Cultured dairy products, such as yogurt, buttermilk, cottage cheese, and sweet acidophilus milk (kefir). These products are usually well tolerated by lactase-deficient people. This is because the healthy bacteria help digest lactose.  Lactose-hydrolyzed milk. This product contains 40% to 90% less lactose  than milk and may also be well tolerated. ADEQUACY These diets may be deficient in calcium, riboflavin, and vitamin D, according to the Recommended Dietary Allowances of the [employer]. Depending on individual tolerances and the use of milk substitutes, milk, or other dairy products, you may be able to meet these recommendations. SPECIAL NOTES  Lactose is a carbohydrate. The main food source for lactose is dairy products. Reading food labels is important. Many products contain lactose even when they are not made from milk. Look for the following words: whey, milk solids, dry milk solids, nonfat dry milk powder. Typical sources of lactose other than dairy products include breads, candies, cold cuts, prepared and processed foods, and commercial sauces and gravies.  All foods must be prepared without milk, cream, or other dairy foods.  A vitamin or mineral supplement may be necessary. Consult your caregiver or Registered Dietitian.  Lactose is also found in many prescription and over-the-counter medicines.  Soy milk and lactose-free supplements may be used as an alternative to milk. CHOOSING FOODS Breads and Starches  Allowed: Breads and rolls made without milk. [place], [place], or [place] bread. Soda crackers, graham crackers. Any crackers prepared without lactose. Cooked or dry cereals prepared without lactose (read labels). Any potatoes, pasta, or rice prepared without milk or lactose. Popcorn.  Avoid: Breads and rolls that contain milk. Prepared mixes such as muffins, biscuits, waffles, pancakes. Sweet rolls, donuts, [place] toast (if made with milk or lactose). Zwieback crackers, corn curls, or any crackers that contain lactose. Cooked or dry cereals prepared with lactose (read labels). Instant potatoes, frozen [place] fries, scalloped or au gratin potatoes. Vegetables  Allowed: Fresh, frozen, and canned  vegetables.  Avoid: Creamed or breaded vegetables. Vegetables in a cheese  sauce or with lactose-containing margarines. Fruit  Allowed: All fresh, canned, or frozen fruits that are not processed with lactose.  Avoid: Any canned or frozen fruits processed with lactose. Meat and Meat Substitutes  Allowed: Plain beef, chicken, fish, [place], lamb, veal, pork, or ham. Kosher prepared meat products. Strained or junior meats that do not contain milk. Eggs, soy meat substitutes, nuts.  Avoid: Scrambled eggs, omelets, and souffles that contain milk. Creamed or breaded meat, fish, or fowl. Sausage products such as wieners, liver sausage, or cold cuts that contain milk solids. Cheese, cottage cheese, or cheese spreads. Milk  Allowed: None.  Avoid: Milk (whole, 2%, skim, or chocolate). Evaporated, powdered, or condensed milk. Malted milk. Soups and Combination Foods  Allowed: Bouillon, broth, vegetable soups, clear soups, consomms. Homemade soups made with allowed ingredients. Combination or prepared foods that do not contain milk or milk products (read labels).  Avoid: Cream soups, chowders, commercially prepared soups containing lactose. Macaroni and cheese, pizza. Combination or prepared foods that contain milk or milk products. Desserts and Sweets  Allowed: Water and fruit ices, gelatin, angel food cake. Homemade cookies, pies, or cakes made from allowed ingredients. Pudding (if made with water or a milk substitute). Lactose-free tofu desserts. Sugar, honey, corn syrup, jam, jelly, marmalade, molasses (beet sugar). Pure sugar candy, marshmallows.  Avoid: Ice cream, ice milk, sherbet, custard, pudding, frozen yogurt. Commercial cake and cookie mixes. Desserts that contain chocolate. Pie crust made with milk-containing margarine. Reduced calorie desserts made with a sugar substitute that contains lactose. Toffee, peppermint, butterscotch, chocolate, caramels. Fats and Oils  Allowed: Butter (as tolerated, contains very small amounts of lactose). Margarines and dressings  that do not contain milk. Vegetable oils, shortening, mayonnaise, nondairy cream and whipped toppings without lactose or milk solids added. Tomasa Blase.  Avoid: Margarines and salad dressings containing milk. Cream, cream cheese, peanut butter with added milk solids, sour cream, chip dips made with sour cream. Beverages  Allowed: Carbonated drinks, tea, coffee and freeze-dried coffee, some instant coffees (check labels). Fruit drinks, fruit and vegetable juice, rice or soy milk.  Avoid: Hot chocolate. Some cocoas, some instant coffees, instant iced teas, powdered fruit drinks (read labels). Condiments  Allowed: Soy sauce, carob powder, olives, gravy made with water, baker's cocoa, pickles, pure seasonings and spices, wine, pure monosodium glutamate, catsup, mustard.  Avoid: Some chewing gums, chocolate, some cocoas. Certain antibiotics and vitamin or mineral preparations. Spice blends if they contain milk products. MSG extender. Artificial sweeteners that contain lactose. Some nondairy creamers (read labels). SAMPLE MENU Breakfast  Orange juice.  Banana.  Bran cereal.  Nondairy creamer.  Vienna bread, toasted.  Butter or milk-free margarine.  Coffee or tea. Lunch  Chicken breast.  Rice.  Green beans.  Butter or milk-free margarine.  Fresh melon.  Coffee or tea. Dinner  [employer].  Baked potato.  Butter or milk-free margarine.  Broccoli.  Lettuce salad with vinegar and oil dressing.  [employer].  Coffee or tea. Document Released: 10/23/2001 Document Revised: 07/26/2011 Document Reviewed: 07/31/2010 [employer] Patient Information 2014 [place], [place].

## 2013-01-11 ENCOUNTER — Ambulatory Visit: Payer: 59 | Admitting: Gastroenterology

## 2013-03-02 ENCOUNTER — Telehealth: Payer: Self-pay | Admitting: *Deleted

## 2013-03-02 ENCOUNTER — Ambulatory Visit (INDEPENDENT_AMBULATORY_CARE_PROVIDER_SITE_OTHER): Payer: 59 | Admitting: Family Medicine

## 2013-03-02 ENCOUNTER — Encounter: Payer: Self-pay | Admitting: Family Medicine

## 2013-03-02 VITALS — BP 140/60 | HR 69 | Temp 98.5°F | Wt 149.8 lb

## 2013-03-02 DIAGNOSIS — K5289 Other specified noninfective gastroenteritis and colitis: Secondary | ICD-10-CM

## 2013-03-02 DIAGNOSIS — K529 Noninfective gastroenteritis and colitis, unspecified: Secondary | ICD-10-CM

## 2013-03-02 DIAGNOSIS — R1013 Epigastric pain: Secondary | ICD-10-CM

## 2013-03-02 LAB — POCT URINALYSIS DIPSTICK
Bilirubin, UA: NEGATIVE
Glucose, UA: NEGATIVE
Ketones, UA: NEGATIVE
Nitrite, UA: NEGATIVE
Protein, UA: NEGATIVE
Spec Grav, UA: <=1.005
Urobilinogen, UA: 0.2
pH, UA: 7.5

## 2013-03-02 LAB — CBC WITH DIFFERENTIAL/PLATELET
Basophils Absolute: 0 10*3/uL (ref 0.0–0.1)
Eosinophils Relative: 0.2 % (ref 0.0–5.0)
Hemoglobin: 13.1 g/dL (ref 12.0–15.0)
Monocytes Relative: 4.6 % (ref 3.0–12.0)
Neutrophils Relative %: 79.8 % — ABNORMAL HIGH (ref 43.0–77.0)
Platelets: 257 10*3/uL (ref 150.0–400.0)
WBC: 9.7 10*3/uL (ref 4.5–10.5)

## 2013-03-02 LAB — HEPATIC FUNCTION PANEL
ALT: 16 U/L (ref 0–35)
AST: 20 U/L (ref 0–37)
Albumin: 3.9 g/dL (ref 3.5–5.2)
Bilirubin, Direct: 0.1 mg/dL (ref 0.0–0.3)
Total Bilirubin: 0.4 mg/dL (ref 0.3–1.2)

## 2013-03-02 LAB — LIPASE: Lipase: 16 U/L (ref 11.0–59.0)

## 2013-03-02 LAB — BASIC METABOLIC PANEL
BUN: 10 mg/dL (ref 6–23)
Calcium: 9.9 mg/dL (ref 8.4–10.5)
Chloride: 103 mEq/L (ref 96–112)
Creatinine, Ser: 0.6 mg/dL (ref 0.4–1.2)

## 2013-03-02 LAB — AMYLASE: Amylase: 93 U/L (ref 27–131)

## 2013-03-02 MED ORDER — PROMETHAZINE HCL 25 MG PO TABS: 25.0000 mg | ORAL_TABLET | Freq: Three times a day (TID) | ORAL | Status: DC | PRN

## 2013-03-02 MED ORDER — GI COCKTAIL ~~LOC~~
30.0000 mL | Freq: Once | ORAL | Status: AC
  Administered 2013-03-02: 30 mL via ORAL

## 2013-03-02 NOTE — Telephone Encounter (Signed)
Patient called and stated she is experiencing vomiting three times throughout the night, once today. Feeling dizzy. Is able to eat and drink. No abdominal pain or diarrhea. Appt was made for today with Dr. Laury Axon.

## 2013-03-02 NOTE — Progress Notes (Signed)
  Subjective:     Karen Evans is a 62 y.o. female who presents for evaluation of nonbilious vomiting 4 times per day, aching and sharp pain located in in the epigastrium, diarrhea 3 times per day, heartburn and nausea. Symptoms have been present for 1 day. Patient denies blood in stool, constipation, dark urine, dysuria, fever, hematemesis, hematuria and melena. Patient's oral intake has been decreased for liquids and decreased for solids. Patient's urine output has been adequate. Other contacts with similar symptoms include: none. Patient denies recent travel history. Patient has had recent ingestion of possible contaminated food, toxic plants, or inappropriate medications/poisons.   The following portions of the patient's history were reviewed and updated as appropriate: allergies, current medications, past family history, past medical history, past social history, past surgical history and problem list.  Review of Systems Pertinent items are noted in HPI.    Objective:     BP 140/60  Pulse 69  Temp(Src) 98.5 F (36.9 C) (Oral)  Wt 149 lb 12.8 oz (67.949 kg)  BMI 26.54 kg/m2  SpO2 96% General appearance: alert, cooperative, appears stated age and mild distress Throat: lips, mucosa, and tongue normal; teeth and gums normal Neck: no adenopathy, supple, symmetrical, trachea midline and thyroid not enlarged, symmetric, no tenderness/mass/nodules Lungs: clear to auscultation bilaterally Heart: S1, S2 normal Abdomen: abnormal findings:  mild tenderness in the epigastrium    Assessment:    Acute Gastroenteritis    Plan:    1. Discussed oral rehydration, reintroduction of solid foods, signs of dehydration. 2. Return or go to emergency department if worsening symptoms, blood or bile, signs of dehydration, diarrhea lasting longer than 5 days or any new concerns. 3. Follow up in 2 days or sooner as needed.

## 2013-03-02 NOTE — Patient Instructions (Signed)
Viral Gastroenteritis Viral gastroenteritis is also known as stomach flu. This condition affects the stomach and intestinal tract. It can cause sudden diarrhea and vomiting. The illness typically lasts 3 to 8 days. Most people develop an immune response that eventually gets rid of the virus. While this natural response develops, the virus can make you quite ill. CAUSES  Many different viruses can cause gastroenteritis, such as rotavirus or noroviruses. You can catch one of these viruses by consuming contaminated food or water. You may also catch a virus by sharing utensils or other personal items with an infected person or by touching a contaminated surface. SYMPTOMS  The most common symptoms are diarrhea and vomiting. These problems can cause a severe loss of body fluids (dehydration) and a body salt (electrolyte) imbalance. Other symptoms may include:  Fever.  Headache.  Fatigue.  Abdominal pain. DIAGNOSIS  Your caregiver can usually diagnose viral gastroenteritis based on your symptoms and a physical exam. A stool sample may also be taken to test for the presence of viruses or other infections. TREATMENT  This illness typically goes away on its own. Treatments are aimed at rehydration. The most serious cases of viral gastroenteritis involve vomiting so severely that you are not able to keep fluids down. In these cases, fluids must be given through an intravenous line (IV). HOME CARE INSTRUCTIONS   Drink enough fluids to keep your urine clear or pale yellow. Drink small amounts of fluids frequently and increase the amounts as tolerated.  Ask your caregiver for specific rehydration instructions.  Avoid:  Foods high in sugar.  Alcohol.  Carbonated drinks.  Tobacco.  Juice.  Caffeine drinks.  Extremely hot or cold fluids.  Fatty, greasy foods.  Too much intake of anything at one time.  Dairy products until 24 to 48 hours after diarrhea stops.  You may consume probiotics.  Probiotics are active cultures of beneficial bacteria. They may lessen the amount and number of diarrheal stools in adults. Probiotics can be found in yogurt with active cultures and in supplements.  Wash your hands well to avoid spreading the virus.  Only take over-the-counter or prescription medicines for pain, discomfort, or fever as directed by your caregiver. Do not give aspirin to children. Antidiarrheal medicines are not recommended.  Ask your caregiver if you should continue to take your regular prescribed and over-the-counter medicines.  Keep all follow-up appointments as directed by your caregiver. SEEK IMMEDIATE MEDICAL CARE IF:   You are unable to keep fluids down.  You do not urinate at least once every 6 to 8 hours.  You develop shortness of breath.  You notice blood in your stool or vomit. This may look like coffee grounds.  You have abdominal pain that increases or is concentrated in one small area (localized).  You have persistent vomiting or diarrhea.  You have a fever.  The patient is a child younger than 3 months, and he or she has a fever.  The patient is a child older than 3 months, and he or she has a fever and persistent symptoms.  The patient is a child older than 3 months, and he or she has a fever and symptoms suddenly get worse.  The patient is a baby, and he or she has no tears when crying. MAKE SURE YOU:   Understand these instructions.  Will watch your condition.  Will get help right away if you are not doing well or get worse. Document Released: 05/03/2005 Document Revised: 07/26/2011 Document Reviewed: 02/17/2011   [employer] Patient Information 2014 [employer].  

## 2013-03-05 ENCOUNTER — Telehealth: Payer: Self-pay | Admitting: *Deleted

## 2013-03-05 ENCOUNTER — Ambulatory Visit: Payer: 59

## 2013-03-05 DIAGNOSIS — K5289 Other specified noninfective gastroenteritis and colitis: Secondary | ICD-10-CM

## 2013-03-05 LAB — H. PYLORI ANTIBODY, IGG: H Pylori IgG: NEGATIVE

## 2013-03-05 NOTE — Telephone Encounter (Signed)
Patient called and got clarification on how many mg she should be taking of her aciphex. Per her chart patient was instructed to ta ke 20mg  po daily. SW, CMA

## 2013-03-09 LAB — HM MAMMOGRAPHY

## 2013-03-21 ENCOUNTER — Other Ambulatory Visit: Payer: Self-pay

## 2013-03-21 MED ORDER — LOSARTAN POTASSIUM 100 MG PO TABS: ORAL_TABLET | ORAL | Status: DC

## 2013-03-30 ENCOUNTER — Telehealth: Payer: Self-pay

## 2013-03-30 NOTE — Telephone Encounter (Signed)
Left message for call back Non identifiable  [facility]-02/2012 Gyn- Dr Cherly Hensen [facility]--01/2012--Dr Patterson--adenomatous polyps--next 2018

## 2013-04-02 ENCOUNTER — Ambulatory Visit (INDEPENDENT_AMBULATORY_CARE_PROVIDER_SITE_OTHER): Payer: 59 | Admitting: Internal Medicine

## 2013-04-02 ENCOUNTER — Encounter: Payer: Self-pay | Admitting: Internal Medicine

## 2013-04-02 VITALS — BP 136/78 | HR 70 | Temp 98.5°F | Resp 13 | Ht 63.25 in | Wt 149.8 lb

## 2013-04-02 DIAGNOSIS — Z8639 Personal history of other endocrine, nutritional and metabolic disease: Secondary | ICD-10-CM

## 2013-04-02 DIAGNOSIS — E049 Nontoxic goiter, unspecified: Secondary | ICD-10-CM

## 2013-04-02 DIAGNOSIS — Z2911 Encounter for prophylactic immunotherapy for respiratory syncytial virus (RSV): Secondary | ICD-10-CM

## 2013-04-02 DIAGNOSIS — Z0000 Encounter for general adult medical examination without abnormal findings: Secondary | ICD-10-CM

## 2013-04-02 LAB — LIPID PANEL
HDL: 68.7 mg/dL (ref >39.00)
Total CHOL/HDL Ratio: 3
Triglycerides: 105 mg/dL (ref 0.0–149.0)
VLDL: 21 mg/dL (ref 0.0–40.0)

## 2013-04-02 LAB — T4, FREE: Free T4: 0.8 ng/dL (ref 0.60–1.60)

## 2013-04-02 NOTE — Progress Notes (Signed)
  Subjective:    Patient ID: Karen Evans, female    DOB: Jul 06, 1950, 62 y.o.   MRN: [id number]  HPI She is here for a physical;acute issues denied.     Review of Systems  Blood pressure recordings @ work not brought in.  Compliant with anti hypertemsive medication. No lightheadedness or other adverse medication effect described.  Significant headaches, epistaxis, chest pain, palpitations, exertional dyspnea, claudication, paroxysmal nocturnal dyspnea, or edema absent.      Objective:   Physical Exam  Gen.:  well-nourished in appearance. Alert, appropriate and cooperative throughout exam.Appears younger than stated age  Head: Normocephalic without obvious abnormalities  Eyes: No corneal or conjunctival inflammation noted. Pupils equal round reactive to light and accommodation. Extraocular motion intact. Ears: External  ear exam reveals no significant lesions or deformities. Canals clear .TMs normal. Hearing is grossly normal bilaterally. Nose: External nasal exam reveals no deformity or inflammation. Nasal mucosa are boggy and moist; L > R. No lesions noted.  Mouth: Oral mucosa and oropharynx reveal no lesions or exudates. Teeth in good repair. Neck: No deformities, masses, or tenderness noted. Range of motion normal. Thyroid : firm R goiter. Lungs: Normal respiratory effort; chest expands symmetrically. Lungs are clear to auscultation without rales, wheezes, or increased work of breathing. Heart: Normal rate and rhythm. Normal S1 and S2. No gallop, click, or rub. S4 w/o murmur. Abdomen: Bowel sounds normal; abdomen soft and nontender. No masses, organomegaly or hernias noted. Aorta palpable ; no AAA Genitalia:  as per Gyn                                  Musculoskeletal/extremities: No deformity or scoliosis noted of  the thoracic or lumbar spine.  No clubbing, cyanosis, edema, or significant extremity  deformity noted. Range of motion normal .Tone & strength normal. Hand joints normal  . Fingernail  health good. Able to lie down & sit up w/o help. Negative SLR bilaterally Vascular: Carotid, radial artery, dorsalis pedis and  posterior tibial pulses are full and equal. No bruits present. Neurologic: Alert and oriented x3. Deep tendon reflexes symmetrical and normal.        Skin: Intact without suspicious lesions or rashes. Multiple skin tags. Lymph: No cervical, axillary lymphadenopathy present. Psych: Mood and affect are normal. Normally interactive                                                                                        Assessment & Plan:  #1 comprehensive physical exam; no acute findings  Plan: see Orders  & Recommendations

## 2013-04-02 NOTE — Patient Instructions (Addendum)
Your next office appointment will be determined based upon review of your pending labs & or x-rays. Those instructions will be transmitted  by mail . To prevent palpitations or premature beats, avoid stimulants such as decongestants, diet pills, nicotine, or caffeine (coffee, tea, cola, or chocolate) to excess.

## 2013-04-02 NOTE — Progress Notes (Signed)
Pre visit review using our clinic review tool, if applicable. No additional management support is needed unless otherwise documented below in the visit note. 

## 2013-04-02 NOTE — Addendum Note (Signed)
Addended by: Verdie Shire on: 04/02/2013 12:02 PM   Modules accepted: Orders

## 2013-04-03 LAB — LDL CHOLESTEROL, DIRECT: Direct LDL: 142.5 mg/dL

## 2013-04-03 NOTE — Telephone Encounter (Signed)
Unable to reach pre visit.  

## 2013-04-05 ENCOUNTER — Ambulatory Visit
Admission: RE | Admit: 2013-04-05 | Discharge: 2013-04-05 | Disposition: A | Discharge status: Home / Self Care | Payer: 59 | Source: Ambulatory Visit | Attending: Internal Medicine | Admitting: Internal Medicine

## 2013-04-05 DIAGNOSIS — E049 Nontoxic goiter, unspecified: Secondary | ICD-10-CM

## 2013-04-05 DIAGNOSIS — Z8639 Personal history of other endocrine, nutritional and metabolic disease: Secondary | ICD-10-CM

## 2013-04-09 ENCOUNTER — Encounter: Payer: Self-pay | Admitting: *Deleted

## 2013-05-09 ENCOUNTER — Encounter: Payer: Self-pay | Admitting: *Deleted

## 2013-05-24 ENCOUNTER — Encounter: Payer: Self-pay | Admitting: Gastroenterology

## 2013-05-24 ENCOUNTER — Ambulatory Visit (INDEPENDENT_AMBULATORY_CARE_PROVIDER_SITE_OTHER): Payer: 59 | Admitting: Gastroenterology

## 2013-05-24 VITALS — BP 130/80 | HR 80 | Ht 63.25 in | Wt 150.0 lb

## 2013-05-24 DIAGNOSIS — K589 Irritable bowel syndrome without diarrhea: Secondary | ICD-10-CM

## 2013-05-24 DIAGNOSIS — K5900 Constipation, unspecified: Secondary | ICD-10-CM

## 2013-05-24 DIAGNOSIS — K219 Gastro-esophageal reflux disease without esophagitis: Secondary | ICD-10-CM

## 2013-05-24 NOTE — Patient Instructions (Signed)
Please follow up in six months with Dr. Hilarie Fredrickson.(Schedule is not out that far, please call and make the appointment)

## 2013-05-24 NOTE — Progress Notes (Signed)
This is a 63 year old African American female with chronic irritable bowel syndrome doing well on a high fiber diet.  She has periodic constipation but denies rectal bleeding.  I reviewed her record today and do not think she has any evidence of inflammatory bowel disease.  She does have chronic GERD and takes daily rabeprazole 20 mg.  She denies chest pain or dysphagia.  Her appetite is good her weight is stable.  She has a history of multiple drug allergies.  Current Medications, Allergies, Past Medical History, Past Surgical History, Family History and Social History were reviewed in [facility] record.  ROS: All systems were reviewed and are negative unless otherwise stated in the HPI.          Physical Exam: At pressure 130/80, pulse 80 and regular and weight 150 the BMI of 26.35.  I cannot appreciate stigmata of chronic liver disease.  Her abdomen shows no distention, organomegaly, masses or tenderness.  Bowel sounds are normal.  Mental status is normal.    Assessment and Plan: Acid reflux well controlled with daily PPI therapy.  She has IBS I've asked her to add Benefiber daily to her regime of high fiber foods.  She is not due for endoscopy or colonoscopy for several years.  She is followed medically by Dr. Unice Cobble and recent CBC, metabolic profile and liver function tests were normal.  Previous evaluation for celiac disease was negative.  She did have a transient bout of biopsy proven ileitis in 2007 which resolved spontaneously.  Serum B12 levels have been consistently normal.  Her clinical course has not been one of inflammatory bowel disease.

## 2013-06-15 ENCOUNTER — Other Ambulatory Visit: Payer: Self-pay | Admitting: Gastroenterology

## 2013-07-17 ENCOUNTER — Telehealth: Payer: Self-pay | Admitting: Gastroenterology

## 2013-07-17 MED ORDER — RABEPRAZOLE SODIUM 20 MG PO TBEC: 20.0000 mg | DELAYED_RELEASE_TABLET | Freq: Every day | ORAL | Status: DC

## 2013-07-17 NOTE — Telephone Encounter (Signed)
Patient needs refill on Aciphex, is it ok to send

## 2013-07-17 NOTE — Telephone Encounter (Signed)
Ok to refill x one year 

## 2013-07-23 ENCOUNTER — Encounter: Payer: Self-pay | Admitting: Internal Medicine

## 2013-07-23 ENCOUNTER — Ambulatory Visit (INDEPENDENT_AMBULATORY_CARE_PROVIDER_SITE_OTHER)
Admission: RE | Admit: 2013-07-23 | Discharge: 2013-07-23 | Disposition: A | Discharge status: Home / Self Care | Payer: 59 | Source: Ambulatory Visit | Attending: Internal Medicine | Admitting: Internal Medicine

## 2013-07-23 ENCOUNTER — Ambulatory Visit (INDEPENDENT_AMBULATORY_CARE_PROVIDER_SITE_OTHER): Payer: 59 | Admitting: Internal Medicine

## 2013-07-23 VITALS — BP 144/84 | HR 80 | Temp 98.0°F | Ht 62.0 in | Wt 150.4 lb

## 2013-07-23 DIAGNOSIS — R0902 Hypoxemia: Secondary | ICD-10-CM

## 2013-07-23 DIAGNOSIS — R0609 Other forms of dyspnea: Secondary | ICD-10-CM

## 2013-07-23 DIAGNOSIS — R0600 Dyspnea, unspecified: Secondary | ICD-10-CM

## 2013-07-23 DIAGNOSIS — R0989 Other specified symptoms and signs involving the circulatory and respiratory systems: Secondary | ICD-10-CM

## 2013-07-23 NOTE — Patient Instructions (Addendum)
Only take allegra as needed for itching sneezing running nose  Please remember to go to the  x-ray department downstairs for your tests - we will call you with the results when they are available.  Please schedule a follow up office visit in 4 weeks, sooner if needed with pfts

## 2013-07-23 NOTE — Progress Notes (Signed)
Quick Note:  LMTCB ______ 

## 2013-07-23 NOTE — Progress Notes (Signed)
   Subjective:    Patient ID: Karen Evans, female    DOB: June 05, 1950  MRN: [id number]  HPI  82 yobf never smoker with h/o rhinitis and reflux self referred for sob.  07/23/2013 1st [facility] office visit/ Macklin Jacquin cc variable doe x years but never at rest or sleeping, can't really define any activity that makes her sob except gets tired  walking in plant all day. No real progression.  No obvious other patterns in day to day or daytime variabilty or assoc chronic cough or cp or chest tightness, subjective wheeze overt sinus or hb symptoms. No unusual exp hx or h/o childhood pna/ asthma or knowledge of premature birth.  Sleeping ok without nocturnal  or early am exacerbation  of respiratory  c/o's or need for noct saba. Also denies any obvious fluctuation of symptoms with weather or environmental changes or other aggravating or alleviating factors except as outlined above   Current Medications, Allergies, Complete Past Medical History, Past Surgical History, Family History, and Social History were reviewed in [facility] record.           Review of Systems  Constitutional: Negative for fever, chills and unexpected weight change.  HENT: Negative for congestion, dental problem, ear pain, nosebleeds, postnasal drip, rhinorrhea, sinus pressure, sneezing, sore throat, trouble swallowing and voice change.   Eyes: Negative for visual disturbance.  Respiratory: Positive for shortness of breath. Negative for cough and choking.   Cardiovascular: Negative for chest pain and leg swelling.  Gastrointestinal: Negative for vomiting, abdominal pain and diarrhea.  Genitourinary: Negative for difficulty urinating.  Musculoskeletal: Negative for arthralgias.  Skin: Negative for rash.  Neurological: Negative for tremors, syncope and headaches.  Hematological: Does not bruise/bleed easily.       Objective:   Physical Exam  amb bf Patient failed to answer a single question  asked in a straightforward manner, tending to go off on tangents or answer questions with ambiguous medical terms or diagnoses and seemed aggravated  when asked the same question more than once for clarification.  HEENT: nl dentition, turbinates, and orophanx. Nl external ear canals without cough reflex   NECK :  without JVD/Nodes/TM/ nl carotid upstrokes bilaterally   LUNGS: no acc muscle use, clear to A and P bilaterally without cough on insp or exp maneuvers   CV:  RRR  no s3 or murmur or increase in P2, no edema   ABD:  soft and nontender with nl excursion in the supine position. No bruits or organomegaly, bowel sounds nl  MS:  warm without deformities, calf tenderness, cyanosis or clubbing  SKIN: warm and dry without lesions    NEURO:  alert, approp, no deficits     CXR  07/23/2013 : No active cardiopulmonary disease      Assessment & Plan:

## 2013-07-24 ENCOUNTER — Telehealth: Payer: Self-pay | Admitting: Internal Medicine

## 2013-07-24 NOTE — Progress Notes (Signed)
Quick Note:  Spoke with pt and notified of results per Dr. Wert. Pt verbalized understanding and denied any questions.  ______ 

## 2013-07-24 NOTE — Assessment & Plan Note (Addendum)
-   07/23/2013  Walked RA x 3 laps @ 185 ft each stopped due end of study, no symptoms, but desat to 87%  Not clear at all why she is sob or fatigued but desats walking are definitely abnormal and need further evaluation for occult PE, ILD.  Will start with CTa and PFT's.

## 2013-07-24 NOTE — Telephone Encounter (Signed)
Pt has been advised of results. Notes Recorded by Rosana Berger, CMA on 07/24/2013 at 10:38 AM Spoke with pt and notified of results per Dr. Melvyn Novas. Pt verbalized understanding and denied any questions

## 2013-07-27 ENCOUNTER — Encounter: Payer: Self-pay | Admitting: Internal Medicine

## 2013-07-27 ENCOUNTER — Ambulatory Visit (INDEPENDENT_AMBULATORY_CARE_PROVIDER_SITE_OTHER)
Admission: RE | Admit: 2013-07-27 | Discharge: 2013-07-27 | Disposition: A | Discharge status: Home / Self Care | Payer: 59 | Source: Ambulatory Visit | Attending: Internal Medicine | Admitting: Internal Medicine

## 2013-07-27 DIAGNOSIS — R0902 Hypoxemia: Secondary | ICD-10-CM

## 2013-07-27 DIAGNOSIS — R0989 Other specified symptoms and signs involving the circulatory and respiratory systems: Secondary | ICD-10-CM

## 2013-07-27 DIAGNOSIS — R0609 Other forms of dyspnea: Secondary | ICD-10-CM

## 2013-07-27 DIAGNOSIS — R0600 Dyspnea, unspecified: Secondary | ICD-10-CM

## 2013-07-27 MED ORDER — IOHEXOL 350 MG/ML SOLN
80.0000 mL | Freq: Once | INTRAVENOUS | Status: AC | PRN
  Administered 2013-07-27: 80 mL via INTRAVENOUS

## 2013-07-30 NOTE — Progress Notes (Signed)
Quick Note:  Spoke with pt and notified of results per Dr. Wert. Pt verbalized understanding and denied any questions.  ______ 

## 2013-08-28 ENCOUNTER — Ambulatory Visit (INDEPENDENT_AMBULATORY_CARE_PROVIDER_SITE_OTHER): Payer: 59 | Admitting: Internal Medicine

## 2013-08-28 ENCOUNTER — Encounter: Payer: Self-pay | Admitting: Internal Medicine

## 2013-08-28 VITALS — BP 134/84 | HR 71 | Temp 98.8°F | Ht 62.0 in | Wt 150.0 lb

## 2013-08-28 DIAGNOSIS — R0609 Other forms of dyspnea: Secondary | ICD-10-CM

## 2013-08-28 DIAGNOSIS — R0600 Dyspnea, unspecified: Secondary | ICD-10-CM

## 2013-08-28 DIAGNOSIS — J309 Allergic rhinitis, unspecified: Secondary | ICD-10-CM

## 2013-08-28 DIAGNOSIS — R0989 Other specified symptoms and signs involving the circulatory and respiratory systems: Secondary | ICD-10-CM

## 2013-08-28 DIAGNOSIS — R0902 Hypoxemia: Secondary | ICD-10-CM

## 2013-08-28 LAB — PULMONARY FUNCTION TEST
DL/VA % pred: 94 %
DL/VA: 4.3 ml/min/mmHg/L
DLCO UNC % PRED: 86 %
DLCO UNC: 18.72 ml/min/mmHg
FEF 25-75 Post: 3.42 L/sec
FEF 25-75 Pre: 3.11 L/sec
FEF2575-%Change-Post: 10 %
FEF2575-%PRED-PRE: 163 %
FEF2575-%Pred-Post: 180 %
FEV1-%Change-Post: 6 %
FEV1-%Pred-Post: 142 %
FEV1-%Pred-Pre: 133 %
FEV1-PRE: 2.54 L
FEV1-Post: 2.7 L
FEV1FVC-%Change-Post: 7 %
FEV1FVC-%Pred-Pre: 104 %
FEV6-%CHANGE-POST: 0 %
FEV6-%PRED-POST: 130 %
FEV6-%PRED-PRE: 130 %
FEV6-Post: 3.05 L
FEV6-Pre: 3.03 L
FEV6FVC-%Change-Post: 0 %
FEV6FVC-%PRED-POST: 104 %
FEV6FVC-%Pred-Pre: 103 %
FVC-%CHANGE-POST: 0 %
FVC-%PRED-POST: 125 %
FVC-%PRED-PRE: 126 %
FVC-Post: 3.05 L
FVC-Pre: 3.07 L
PRE FEV1/FVC RATIO: 83 %
PRE FEV6/FVC RATIO: 100 %
Post FEV1/FVC ratio: 89 %
Post FEV6/FVC ratio: 100 %
RV % PRED: 95 %
RV: 1.81 L
TLC % pred: 101 %
TLC: 4.84 L

## 2013-08-28 NOTE — Patient Instructions (Signed)
Pulmonary follow up is as needed        

## 2013-08-28 NOTE — Progress Notes (Signed)
   Subjective:    Patient ID: Karen Evans, female    DOB: 08-22-1950  MRN: [id number]    Brief patient profile:  52 yobf never smoker with h/o rhinitis and reflux self referred for sob with completely nl pfts  08/28/2013    History of Present Illness  07/23/2013 1st [facility] office visit/ Samer Dutton cc variable doe x years but never at rest or sleeping, can't really define any activity that makes her sob except gets tired  walking in plant all day. No real progression. rec Only take allegra as needed for itching sneezing running nose. Return for pfts   08/28/2013 f/u ov/Jabarie Pop re:  Chief Complaint  Patient presents with  . Followup with PFT    Pt reports that her breathing has improved since her last visit. She reports she relates this to only taking allegra prn.  No new co's today.       No obvious day to day or daytime variabilty or assoc chronic cough or cp or chest tightness, subjective wheeze overt sinus or hb symptoms. No unusual exp hx or h/o childhood pna/ asthma or knowledge of premature birth.  Sleeping ok without nocturnal  or early am exacerbation  of respiratory  c/o's or need for noct saba. Also denies any obvious fluctuation of symptoms with weather or environmental changes or other aggravating or alleviating factors except as outlined above   Current Medications, Allergies, Complete Past Medical History, Past Surgical History, Family History, and Social History were reviewed in [employer] record.  ROS  The following are not active complaints unless bolded sore throat, dysphagia, dental problems, itching, sneezing,  nasal congestion or excess/ purulent secretions, ear ache,   fever, chills, sweats, unintended wt loss, pleuritic or exertional cp, hemoptysis,  orthopnea pnd or leg swelling, presyncope, palpitations, heartburn, abdominal pain, anorexia, nausea, vomiting, diarrhea  or change in bowel or urinary habits, change in stools or urine,  dysuria,hematuria,  rash, arthralgias, visual complaints, headache, numbness weakness or ataxia or problems with walking or coordination,  change in mood/affect or memory.                      Objective:   Physical Exam  amb bf Patient failed to answer a single question asked in a straightforward manner, tending to go off on tangents or answer questions with ambiguous medical terms or diagnoses and seemed aggravated  when asked the same question more than once for clarification.  Wt Readings from Last 3 Encounters:  08/28/13 150 lb (68.04 kg)  07/23/13 150 lb 6.4 oz (68.221 kg)  05/24/13 150 lb (68.04 kg)      HEENT: nl dentition, turbinates, and orophanx. Nl external ear canals without cough reflex   NECK :  without JVD/Nodes/TM/ nl carotid upstrokes bilaterally   LUNGS: no acc muscle use, clear to A and P bilaterally without cough on insp or exp maneuvers   CV:  RRR  no s3 or murmur or increase in P2, no edema   ABD:  soft and nontender with nl excursion in the supine position. No bruits or organomegaly, bowel sounds nl  MS:  warm without deformities, calf tenderness, cyanosis or clubbing  SKIN: warm and dry without lesions    NEURO:  alert, approp, no deficits     CXR  07/23/2013 : No active cardiopulmonary disease      Assessment & Plan:

## 2013-08-28 NOTE — Assessment & Plan Note (Signed)
Ok to use otcs prn

## 2013-08-28 NOTE — Progress Notes (Signed)
PFT done today. 

## 2013-08-28 NOTE — Assessment & Plan Note (Signed)
-   07/23/2013  Walked RA x 3 laps @ 185 ft each stopped due end of study, no symptoms, but desat to 87% - CTa 07/27/2013 > 1. No embolus or thoracic aortic dissection. 2. 3 mm left upper lobe pulmonary nodule. If the patient is at high risk for bronchogenic carcinoma, follow-up chest CT at 1 year is recommended. If the patient is at low risk, no follow-up is needed - PFT's [phone] >  PFTs wnl  - 08/28/2013  Walked RA x 3 laps @ 185 ft each stopped due to end of study, erratic sats but at end of study sats 96% and no sob  I now believe her previous desats were spurious as no evidence of pe, ild or airways dz here  Pulmonary f/u is prn

## 2013-09-10 ENCOUNTER — Other Ambulatory Visit: Payer: Self-pay | Admitting: Family Medicine

## 2013-09-10 NOTE — Telephone Encounter (Signed)
OK until 11/15

## 2013-10-04 ENCOUNTER — Encounter: Payer: Self-pay | Admitting: Internal Medicine

## 2013-10-11 ENCOUNTER — Encounter: Payer: Self-pay | Admitting: Internal Medicine

## 2013-10-12 ENCOUNTER — Encounter: Payer: Self-pay | Admitting: Internal Medicine

## 2013-10-12 ENCOUNTER — Other Ambulatory Visit (INDEPENDENT_AMBULATORY_CARE_PROVIDER_SITE_OTHER): Payer: 59

## 2013-10-12 ENCOUNTER — Ambulatory Visit (INDEPENDENT_AMBULATORY_CARE_PROVIDER_SITE_OTHER): Payer: 59 | Admitting: Internal Medicine

## 2013-10-12 VITALS — BP 126/68 | HR 64 | Ht 63.0 in | Wt 148.0 lb

## 2013-10-12 DIAGNOSIS — Z86010 Personal history of colonic polyps: Secondary | ICD-10-CM | POA: Insufficient documentation

## 2013-10-12 DIAGNOSIS — K219 Gastro-esophageal reflux disease without esophagitis: Secondary | ICD-10-CM

## 2013-10-12 DIAGNOSIS — K5900 Constipation, unspecified: Secondary | ICD-10-CM

## 2013-10-12 DIAGNOSIS — K589 Irritable bowel syndrome without diarrhea: Secondary | ICD-10-CM

## 2013-10-12 LAB — COMPREHENSIVE METABOLIC PANEL
ALK PHOS: 65 U/L (ref 39–117)
ALT: 19 U/L (ref 0–35)
AST: 20 U/L (ref 0–37)
Albumin: 3.6 g/dL (ref 3.5–5.2)
BILIRUBIN TOTAL: 0.7 mg/dL (ref 0.2–1.2)
BUN: 13 mg/dL (ref 6–23)
CO2: 31 meq/L (ref 19–32)
Calcium: 9.8 mg/dL (ref 8.4–10.5)
Chloride: 103 mEq/L (ref 96–112)
Creatinine, Ser: 0.8 mg/dL (ref 0.4–1.2)
GFR: 101.07 mL/min (ref >60.00)
Glucose, Bld: 82 mg/dL (ref 70–99)
POTASSIUM: 4.2 meq/L (ref 3.5–5.1)
Sodium: 137 mEq/L (ref 135–145)
TOTAL PROTEIN: 7.1 g/dL (ref 6.0–8.3)

## 2013-10-12 LAB — CBC
HEMATOCRIT: 38.3 % (ref 36.0–46.0)
Hemoglobin: 12.5 g/dL (ref 12.0–15.0)
MCHC: 32.6 g/dL (ref 30.0–36.0)
MCV: 80.2 fl (ref 78.0–100.0)
Platelets: 265 10*3/uL (ref 150.0–400.0)
RBC: 4.78 Mil/uL (ref 3.87–5.11)
RDW: 13.9 % (ref 11.5–15.5)
WBC: 6.5 10*3/uL (ref 4.0–10.5)

## 2013-10-12 NOTE — Patient Instructions (Signed)
Your physician has requested that you go to the basement for the following lab work before leaving today: CBC, CMP  Continue taking Aciphex.  Add Benefiber 1 tablespoon daily to your diet.  Follow up in office in 1 year
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
## 2014-01-22 ENCOUNTER — Encounter: Payer: Self-pay | Admitting: Gastroenterology

## 2014-03-11 ENCOUNTER — Other Ambulatory Visit: Payer: Self-pay

## 2014-03-11 MED ORDER — LOSARTAN POTASSIUM 100 MG PO TABS: ORAL_TABLET | ORAL | Status: DC

## 2014-03-27 ENCOUNTER — Telehealth: Payer: Self-pay | Admitting: Internal Medicine

## 2014-03-27 MED ORDER — RABEPRAZOLE SODIUM 20 MG PO TBEC: 20.0000 mg | DELAYED_RELEASE_TABLET | Freq: Two times a day (BID) | ORAL | Status: DC

## 2014-03-27 NOTE — Telephone Encounter (Signed)
Pt states she has been taking her aciphex daily and that it does not seem to helping her reflux symptoms like it used to. Pt states the past couple of days she took an additional aciphex and it seemed to help. Pt wants to know if she may need something else since she has been on aciphex for quite some time. Dr. Sharlett Iles prescribed it for her in the past. Dr. Fuller Plan as doc of the day please advise.

## 2014-03-27 NOTE — Telephone Encounter (Signed)
Pt aware and script sent to pharmacy for 1 mth worth of aciphex 20mg  BID.

## 2014-03-27 NOTE — Telephone Encounter (Signed)
Try Aciphex 20 mg bid for 1 month Follow up with her primary GI MD, Dr. Hilarie Fredrickson

## 2014-03-27 NOTE — Telephone Encounter (Signed)
Left message for pt to call back  °

## 2014-04-03 ENCOUNTER — Ambulatory Visit (INDEPENDENT_AMBULATORY_CARE_PROVIDER_SITE_OTHER): Payer: 59 | Admitting: Internal Medicine

## 2014-04-03 ENCOUNTER — Encounter: Payer: Self-pay | Admitting: Internal Medicine

## 2014-04-03 VITALS — BP 140/86 | HR 87 | Temp 98.2°F | Resp 14 | Ht 63.0 in | Wt 153.1 lb

## 2014-04-03 DIAGNOSIS — E785 Hyperlipidemia, unspecified: Secondary | ICD-10-CM

## 2014-04-03 DIAGNOSIS — R739 Hyperglycemia, unspecified: Secondary | ICD-10-CM

## 2014-04-03 DIAGNOSIS — Z0000 Encounter for general adult medical examination without abnormal findings: Secondary | ICD-10-CM

## 2014-04-03 DIAGNOSIS — Z0189 Encounter for other specified special examinations: Secondary | ICD-10-CM

## 2014-04-03 DIAGNOSIS — I10 Essential (primary) hypertension: Secondary | ICD-10-CM

## 2014-04-03 MED ORDER — LOSARTAN POTASSIUM 100 MG PO TABS: ORAL_TABLET | ORAL | Status: DC

## 2014-04-03 NOTE — Progress Notes (Signed)
Subjective:    Patient ID: Karen Evans, female    DOB: 1950-05-29, 63 y.o.   MRN: [id number]  HPI  She is here for a physical;acute issues denied   She has been compliant with her medications and denies adverse effects  Blood pressures are monitored at work. Blood pressure ranges from 120/68-148/88. The average is 138/76  She is on no specific diet. She does restrict salt.  She does some back and neck exercises and is very physically active at work without cardiopulmonary symptoms  She does state that she will have palpitations if she takes her multivitamin every day but no symptoms with the exercise.  She has been having increased dyspepsia. The Aciphex was increased to twice a day. She will follow-up with her gastroenterologist.  He did perform labs in May of this year. BMET and CBC and differential were normal  Her last lipids were essentially at goal in November 2014. TSH was low normal at 0.46.    Review of Systems   Chest pain,  tachycardia, exertional dyspnea, paroxysmal nocturnal dyspnea, claudication or edema are absent.  Unexplained weight loss, abdominal pain,  dysphagia, melena, rectal bleeding, or persistently small caliber stools are denied.     Objective:   Physical Exam Gen.: Healthy and well-nourished in appearance. Alert, appropriate and cooperative throughout exam. Appears younger than stated age  Head: Normocephalic without obvious abnormalities .White forelock. Eyes: No corneal or conjunctival inflammation noted. Pupils equal round reactive to light and accommodation. Extraocular motion intact.  Ears: External  ear exam reveals no significant lesions or deformities. Canals clear .TMs normal. Hearing is grossly normal bilaterally. Nose: External nasal exam reveals no deformity or inflammation. Nasal mucosa are pink and moist. No lesions or exudates noted.   Mouth: Oral mucosa and oropharynx reveal no lesions or exudates. Teeth in good repair. Neck:  No deformities, masses, or tenderness noted. Range of motion &Thyroid normal. Lungs: Normal respiratory effort; chest expands symmetrically. Lungs are clear to auscultation without rales, wheezes, or increased work of breathing. Heart: Normal rate and rhythm. Normal S1 and S2. No gallop, click, or rub.No  murmur. Abdomen: Bowel sounds normal; abdomen soft and nontender. No masses, organomegaly or hernias noted. Genitalia:  as per Gyn                                  Musculoskeletal/extremities:  Accentuated curvature of upper thoracic spine.No clubbing, cyanosis, edema, or significant extremity  deformity noted. Range of motion normal .Tone & strength normal. Hand joints normal Fingernail health good. Able to lie down & sit up w/o help. Negative SLR bilaterally Vascular: Carotid, radial artery, dorsalis pedis and  posterior tibial pulses are full and equal. No bruits present. Neurologic: Alert and oriented x3. Deep tendon reflexes symmetrical and 1/2+  Gait normal .      Skin: Intact without suspicious lesions or rashes. Lymph: No cervical, axillary lymphadenopathy present. Psych: Mood and affect are normal. Normally interactive  Assessment & Plan:  #1 comprehensive physical exam; no acute findings  Plan: see Orders  & Recommendations

## 2014-04-03 NOTE — Patient Instructions (Addendum)
Your next office appointment will be determined based upon review of your pending labs . Those instructions will be transmitted to you  by mail Reflux of gastric acid may be asymptomatic as this may occur mainly during sleep.The triggers for reflux  include stress; the "aspirin family" ; alcohol; peppermint; and caffeine (coffee, tea, cola, and chocolate). The aspirin family would include aspirin and the nonsteroidal agents such as ibuprofen &  Naproxen. Tylenol would not cause reflux. If having symptoms ; food & drink should be avoided for @ least 2 hours before going to bed.

## 2014-04-03 NOTE — Progress Notes (Signed)
Pre visit review using our clinic review tool, if applicable. No additional management support is needed unless otherwise documented below in the visit note. 

## 2014-04-18 ENCOUNTER — Other Ambulatory Visit (INDEPENDENT_AMBULATORY_CARE_PROVIDER_SITE_OTHER): Payer: 59

## 2014-04-18 DIAGNOSIS — Z0000 Encounter for general adult medical examination without abnormal findings: Secondary | ICD-10-CM

## 2014-04-18 DIAGNOSIS — Z0189 Encounter for other specified special examinations: Secondary | ICD-10-CM

## 2014-04-18 LAB — VITAMIN D 25 HYDROXY (VIT D DEFICIENCY, FRACTURES): VITD: 33.65 ng/mL (ref 30.00–100.00)

## 2014-04-18 LAB — HEMOGLOBIN A1C: Hgb A1c MFr Bld: 6.1 % (ref 4.6–6.5)

## 2014-04-18 LAB — LIPID PANEL
Cholesterol: 240 mg/dL — ABNORMAL HIGH (ref 0–200)
HDL: 59.7 mg/dL (ref >39.00)
LDL CALC: 153 mg/dL — AB (ref 0–99)
NONHDL: 180.3
Total CHOL/HDL Ratio: 4
Triglycerides: 136 mg/dL (ref 0.0–149.0)
VLDL: 27.2 mg/dL (ref 0.0–40.0)

## 2014-04-18 LAB — T4, FREE: Free T4: 0.97 ng/dL (ref 0.60–1.60)

## 2014-04-18 LAB — TSH: TSH: 0.61 u[IU]/mL (ref 0.35–4.50)

## 2014-06-27 ENCOUNTER — Telehealth: Payer: Self-pay | Admitting: Internal Medicine

## 2014-06-28 MED ORDER — RABEPRAZOLE SODIUM 20 MG PO TBEC: 20.0000 mg | DELAYED_RELEASE_TABLET | Freq: Two times a day (BID) | ORAL | Status: DC

## 2014-06-28 NOTE — Telephone Encounter (Signed)
Spoke to patient. There was no mention from the M.D. About switching medications. Dr Fuller Plan (who was doc of day) mentioned increasing her Aciphex to twice daily dosing. Patient states that she has done well on twice daily dosing. I will send a refill.

## 2014-10-20 ENCOUNTER — Other Ambulatory Visit: Payer: Self-pay | Admitting: Internal Medicine

## 2014-10-22 ENCOUNTER — Other Ambulatory Visit: Payer: Self-pay | Admitting: Internal Medicine

## 2014-10-23 ENCOUNTER — Telehealth: Payer: Self-pay | Admitting: Internal Medicine

## 2014-10-23 NOTE — Telephone Encounter (Signed)
Pt states she is taking aciphex bid and is still having problems with reflux and now with nausea. Pt scheduled to see Nicoletta Ba PA 10/31/14@2pm . Pt aware of appt.

## 2014-10-30 ENCOUNTER — Encounter: Payer: Self-pay | Admitting: *Deleted

## 2014-10-31 ENCOUNTER — Ambulatory Visit (INDEPENDENT_AMBULATORY_CARE_PROVIDER_SITE_OTHER): Payer: 59 | Admitting: Physician Assistant

## 2014-10-31 ENCOUNTER — Other Ambulatory Visit (INDEPENDENT_AMBULATORY_CARE_PROVIDER_SITE_OTHER): Payer: 59

## 2014-10-31 ENCOUNTER — Encounter: Payer: Self-pay | Admitting: Physician Assistant

## 2014-10-31 ENCOUNTER — Encounter (INDEPENDENT_AMBULATORY_CARE_PROVIDER_SITE_OTHER): Payer: Self-pay

## 2014-10-31 VITALS — BP 114/72 | HR 72 | Ht 62.0 in | Wt 155.2 lb

## 2014-10-31 DIAGNOSIS — R1011 Right upper quadrant pain: Secondary | ICD-10-CM

## 2014-10-31 DIAGNOSIS — R1012 Left upper quadrant pain: Secondary | ICD-10-CM | POA: Diagnosis not present

## 2014-10-31 DIAGNOSIS — R110 Nausea: Secondary | ICD-10-CM

## 2014-10-31 DIAGNOSIS — K589 Irritable bowel syndrome without diarrhea: Secondary | ICD-10-CM

## 2014-10-31 LAB — COMPREHENSIVE METABOLIC PANEL
ALT: 16 U/L (ref 0–35)
AST: 17 U/L (ref 0–37)
Albumin: 3.8 g/dL (ref 3.5–5.2)
Alkaline Phosphatase: 79 U/L (ref 39–117)
BUN: 13 mg/dL (ref 6–23)
CO2: 29 mEq/L (ref 19–32)
CREATININE: 0.72 mg/dL (ref 0.40–1.20)
Calcium: 9.4 mg/dL (ref 8.4–10.5)
Chloride: 104 mEq/L (ref 96–112)
GFR: 105.58 mL/min (ref >60.00)
Glucose, Bld: 70 mg/dL (ref 70–99)
POTASSIUM: 4 meq/L (ref 3.5–5.1)
Sodium: 137 mEq/L (ref 135–145)
Total Bilirubin: 0.4 mg/dL (ref 0.2–1.2)
Total Protein: 6.8 g/dL (ref 6.0–8.3)

## 2014-10-31 LAB — CBC WITH DIFFERENTIAL/PLATELET
Basophils Absolute: 0 10*3/uL (ref 0.0–0.1)
Basophils Relative: 0.3 % (ref 0.0–3.0)
Eosinophils Absolute: 0.1 10*3/uL (ref 0.0–0.7)
Eosinophils Relative: 1.5 % (ref 0.0–5.0)
HCT: 37.9 % (ref 36.0–46.0)
HEMOGLOBIN: 12.2 g/dL (ref 12.0–15.0)
Lymphocytes Relative: 30.2 % (ref 12.0–46.0)
Lymphs Abs: 2.4 10*3/uL (ref 0.7–4.0)
MCHC: 32.3 g/dL (ref 30.0–36.0)
MCV: 80.4 fl (ref 78.0–100.0)
MONO ABS: 0.7 10*3/uL (ref 0.1–1.0)
Monocytes Relative: 9.5 % (ref 3.0–12.0)
NEUTROS ABS: 4.6 10*3/uL (ref 1.4–7.7)
Neutrophils Relative %: 58.5 % (ref 43.0–77.0)
Platelets: 269 10*3/uL (ref 150.0–400.0)
RBC: 4.71 Mil/uL (ref 3.87–5.11)
RDW: 14 % (ref 11.5–15.5)
WBC: 7.9 10*3/uL (ref 4.0–10.5)

## 2014-10-31 LAB — IGA: IGA: 294 mg/dL (ref 68–378)

## 2014-10-31 LAB — SEDIMENTATION RATE: Sed Rate: 20 mm/hr (ref 0–22)

## 2014-10-31 LAB — LIPASE: Lipase: 14 U/L (ref 11.0–59.0)

## 2014-10-31 MED ORDER — PANTOPRAZOLE SODIUM 40 MG PO TBEC: 40.0000 mg | DELAYED_RELEASE_TABLET | Freq: Every day | ORAL | Status: DC

## 2014-10-31 NOTE — Progress Notes (Addendum)
Patient ID: KATHERIN RAMEY, female   DOB: [birth year], 64 y.o.   MRN: [id number]   Subjective:    Patient ID: DEIRDRA HEUMANN, female    DOB: May 29, 1950, 64 y.o.   MRN: [id number]  HPI  Olina is a pleasant 64 year old African-American female patient of Dr. Vena Rua who has history of GERD, hypertension, IBS, and adenomatous colon polyps. She was last seen here about 1 year ago and has been maintained on AcipHex 20 mg by mouth daily. Last colonoscopy was done in September 2013 she did have  an adenomatous polyp removed and is recommended for 5 year interval follow-up. EGD was done at that same time showed a small hiatal hernia and she was [name] dilated otherwise negative exam. She comes in today with complaints of increase in epigastric discomfort. She says her symptoms started after she ate some chocolate on April 18 and that she's been having difficulty since. She increase the AcipHex to twice daily but wasn't sure this was making much difference. She has been taking occasional Pepto-Bismol and does find this helpful. Her appetite has been fine her weight has been stable. Her bowel movements have been normal she has no complaints of nausea or vomiting no dysphagia odynophagia or heartburn or indigestion. She's unable to be more specific about her pain other than soreness in the epigastric area, with some bloating.  Review of Systems Pertinent positive and negative review of systems were noted in the above HPI section.  All other review of systems was otherwise negative.  Outpatient Encounter Prescriptions as of 10/31/2014  Medication Sig  . Calcium Carbonate-Vitamin D (CALTRATE 600+D PO) Take 1 tablet by mouth 2 (two) times daily.  . Cholecalciferol (VITAMIN D PO) Take 1 tablet by mouth daily.  . fexofenadine (ALLEGRA) 180 MG tablet Take 180 mg by mouth daily as needed.   . Lactase (LACTAID PO) Take 1 tablet by mouth as needed.  Marland Kitchen losartan (COZAAR) 100 MG tablet TAKE ONE TABLET BY MOUTH ONCE DAILY.  TAKES THE PLACE OF BENAZEPRIL  . Multiple Vitamin (MULTIVITAMIN) capsule Take 1 capsule by mouth daily.  . RABEprazole (ACIPHEX) 20 MG tablet TAKE ONE TABLET BY MOUTH TWICE DAILY  . RABEprazole (ACIPHEX) 20 MG tablet TAKE ONE TABLET BY MOUTH TWICE DAILY (Patient taking differently: taking once daily)  . pantoprazole (PROTONIX) 40 MG tablet Take 1 tablet (40 mg total) by mouth daily.   No facility-administered encounter medications on file as of 10/31/2014.   Allergies  Allergen Reactions  . Cefprozil     hives  . Erythromycin     hives  . Esomeprazole Magnesium     REACTION: hives  . Penicillins     REACTION: rash  . Sulfonamide Derivatives     hives  . Doxycycline     nausea   Patient Active Problem List   Diagnosis Date Noted  . Hx of adenomatous colonic polyps 10/12/2013  . Constipation 10/12/2013  . Dyspnea 07/23/2013  . Other and unspecified hyperlipidemia 03/29/2011  . GERD (gastroesophageal reflux disease) 02/18/2011  . 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 VITAMIN D DEFICIENCY 03/26/2010  . INVOLUNTARY MOVEMENTS, ABNORMAL 11/27/2009  . Hyperglycemia 12/31/2008  . IRRITABLE BOWEL SYNDROME 04/25/2008  . REFLUX ESOPHAGITIS 04/24/2008  . HIATAL HERNIA 04/24/2008  . RHINITIS 03/14/2008  . History of uterine fibroid 12/29/2007  . GOITER, DIFFUSE 12/29/2007  . Carpal tunnel syndrome 12/29/2007  . HYPERTENSION 12/29/2007  . ANEMIA-NOS 07/03/2006   History   Social History  . Marital Status: Single  Spouse Name: N/A  . Number of Children: 0  . Years of Education: N/A   Occupational History  . TESTER [employer]   Social History Main Topics  . Smoking status: Never Smoker   . Smokeless tobacco: Never Used  . Alcohol Use: No  . Drug Use: No  . Sexual Activity: Not on file   Other Topics Concern  . Not on file   Social History Narrative    Ms. Gillespie's family history includes Diabetes in her brother and mother; Heart attack in an  other family member; Heart disease in her mother; Hypertension in her brother and mother; Lymphoma in her father; Penile cancer in her paternal grandfather; Rectal cancer in her paternal grandmother. There is no history of COPD, Colon cancer, Esophageal cancer, Stomach cancer, or Stroke.      Objective:    Filed Vitals:   10/31/14 1355  BP: 114/72  Pulse: 72    Physical Exam  well-developed African-American female in no acute distress, blood pressure 114/72 pulse 72 height 5 foot 2 weight 155. HEENT; nontraumatic normocephalic EOMI PERRLA sclera anicteric no JVD, Cardiovascular; regular rate and rhythm with S1-S2 no murmur rub or gallop, Pulmonary; clear bilaterally, Abdomen ;soft minimally tender in the epigastrium there is no guarding or rebound no palpable mass or hepatosplenomegaly bowel sounds are present, Rectal ;exam not done, Extremities ;no clubbing cyanosis or edema skin warm dry, Psych ;mood and affect appropriate       Assessment & Plan:   #1 64 yo female with chronic GERD, and IBS with epigastric discomfort /dyspepsia x 2 months  possibly relate to poorly controlled GERD, R/O GB disease, gastritis #2 hx of adenomatous polyps-due for f/u 01/2017  Plan; Will switch to Protonix 40 mg po daily  Schedule for upper abdominal US Follow up Dr Hilarie Fredrickson as needed   Alfredia Ferguson PA-C 10/31/2014   Cc: Hendricks Limes, MD  Addendum: Reviewed and agree with initial management. Jerene Bears, MD

## 2014-10-31 NOTE — Patient Instructions (Addendum)
Please go to the basement level to have your labs drawn.  We sent a prescription to [address]. Pantoprazole sodium 40 mg.  You have been scheduled for an abdominal ultrasound at [facility] (1st floor of hospital) on  11-07-2014 at 9:30 am . Please arrive at 9:15 am  minutes prior to your appointment for registration. Make certain not to have anything to eat or drink after midnight.. Should you need to reschedule your appointment, please contact radiology at [phone]. This test typically takes about 30 minutes to perform.

## 2014-11-01 LAB — TISSUE TRANSGLUTAMINASE, IGG: TISSUE TRANSGLUT AB: 3 U/mL (ref <6)

## 2014-11-07 ENCOUNTER — Ambulatory Visit (HOSPITAL_COMMUNITY)
Admission: RE | Admit: 2014-11-07 | Discharge: 2014-11-07 | Disposition: A | Discharge status: Home / Self Care | Payer: 59 | Source: Ambulatory Visit | Attending: Physician Assistant | Admitting: Physician Assistant

## 2014-11-07 DIAGNOSIS — R1011 Right upper quadrant pain: Secondary | ICD-10-CM

## 2014-11-07 DIAGNOSIS — R1012 Left upper quadrant pain: Secondary | ICD-10-CM

## 2014-11-07 DIAGNOSIS — R1010 Upper abdominal pain, unspecified: Secondary | ICD-10-CM | POA: Insufficient documentation

## 2014-11-07 DIAGNOSIS — R110 Nausea: Secondary | ICD-10-CM

## 2014-11-07 DIAGNOSIS — K589 Irritable bowel syndrome without diarrhea: Secondary | ICD-10-CM

## 2014-11-12 ENCOUNTER — Telehealth: Payer: Self-pay | Admitting: Physician Assistant

## 2014-11-12 NOTE — Telephone Encounter (Signed)
I agree she is a difficult hiostorian- she has been tested for hpylori in the past and negative- would encourage her to continue the Aciphex daily if helping

## 2014-11-12 NOTE — Telephone Encounter (Signed)
Patient is a very poor historian. She has difficulty stating what her symptoms are. She is concerned that she may have infection of her stomach. I think she may have concerns about H-pylori infection. She asked if she didn't take her medicine (Aciphex) would her symptoms come back. She mentions tight pants make her stomach hurt more. She said the words burning and gas. What do you think about the h-pylori? I have reviewed Aciphex daily with her, clothing, foods and times to eat.

## 2014-11-13 NOTE — Telephone Encounter (Signed)
Patient advised. She states she also loosened the waist of her pants and her stomach stopped hurting.

## 2015-04-04 ENCOUNTER — Other Ambulatory Visit: Payer: Self-pay | Admitting: Internal Medicine

## 2015-04-09 ENCOUNTER — Encounter: Payer: 59 | Admitting: Internal Medicine

## 2015-04-09 ENCOUNTER — Ambulatory Visit (INDEPENDENT_AMBULATORY_CARE_PROVIDER_SITE_OTHER): Payer: 59 | Admitting: Internal Medicine

## 2015-04-09 ENCOUNTER — Encounter: Payer: Self-pay | Admitting: Internal Medicine

## 2015-04-09 VITALS — BP 136/82 | HR 64 | Temp 98.3°F | Resp 20 | Ht 63.0 in | Wt 153.5 lb

## 2015-04-09 DIAGNOSIS — I10 Essential (primary) hypertension: Secondary | ICD-10-CM

## 2015-04-09 MED ORDER — LOSARTAN POTASSIUM 100 MG PO TABS: 100.0000 mg | ORAL_TABLET | Freq: Every day | ORAL | Status: DC

## 2015-04-09 NOTE — Patient Instructions (Signed)
Your ideal BP goal = AVERAGE < 135/85.Minimal goal is average < 140/90. Avoid ingestion of  excess salt/sodium.Cook with pepper & other spices . Use the salt substitute "No Salt" OR the Mrs Dash products to season food @ the table. Avoid foods which taste salty or "vinegary" as their sodium content will be high. 

## 2015-04-09 NOTE — Progress Notes (Signed)
   Subjective:    Patient ID: Karen Evans, female    DOB: January 21, 1951, 64 y.o.   MRN: [id number]  HPI  She's here to have her blood pressure medicines refilled. She denies intake of excess red meat or fried foods. She does eat salted snacks such as Cheetos or potato chips. She has a very physical job.  The only chest discomfort she describes is when she takes her 2000 IUs of vitamin D. There is no definite dysphagia or significant dyspepsia.  Blood pressure is checked at work; she cannot remember the values. Her creatinine was checked in June of this year and was 0.72.  Review of Systems   Exertional chest pain, palpitations, tachycardia, exertional dyspnea, paroxysmal nocturnal dyspnea, claudication or edema are absent. Unexplained weight loss, abdominal pain, significant dyspepsia, dysphagia, melena, rectal bleeding, or persistently small caliber stools are denied.      Objective:   Physical Exam  Pertinent or positive findings include: Alopecia present over the crown. Minimal arteriolar narrowing on fundal exam. Deep reflexes 0-1/2+ at the knees. She is a very sweet lady but a very poor historian. Answers are very vague.  General appearance :adequately nourished; in no distress.  Eyes: No conjunctival inflammation or scleral icterus is present.  Oral exam:  Lips and gums are healthy appearing.There is no oropharyngeal erythema or exudate noted. Dental hygiene is excellent.  Heart:  Normal rate and regular rhythm. S1 and S2 normal without gallop, murmur, click, rub or other extra sounds    Lungs:Chest clear to auscultation; no wheezes, rhonchi,rales ,or rubs present.No increased work of breathing.   Abdomen: bowel sounds normal, soft and non-tender without masses, organomegaly or hernias noted.  No guarding or rebound. No AAA  Vascular : all pulses equal ; no bruits present.  Skin:Warm & dry.  Intact without suspicious lesions or rashes ; no tenting .  Lymphatic: No  lymphadenopathy is noted about the head, neck, axilla.   Neuro: Strength, tone  normal.     Assessment & Plan:  #1 essential hypertension See orders

## 2015-08-22 ENCOUNTER — Telehealth: Payer: Self-pay | Admitting: Internal Medicine

## 2015-08-22 DIAGNOSIS — I10 Essential (primary) hypertension: Secondary | ICD-10-CM

## 2015-08-22 NOTE — Telephone Encounter (Signed)
Pt request refill for losartan (COZAAR) 100 MG tablet to be send to [facility]. Please help, pt has an appt with Dr. Quay Burow 10/27/15.

## 2015-08-25 MED ORDER — LOSARTAN POTASSIUM 100 MG PO TABS: 100.0000 mg | ORAL_TABLET | Freq: Every day | ORAL | Status: DC

## 2015-09-26 ENCOUNTER — Other Ambulatory Visit: Payer: Self-pay

## 2015-09-26 ENCOUNTER — Telehealth: Payer: Self-pay | Admitting: Physician Assistant

## 2015-09-26 MED ORDER — LANSOPRAZOLE 30 MG PO CPDR: 30.0000 mg | DELAYED_RELEASE_CAPSULE | Freq: Every day | ORAL | Status: DC

## 2015-09-26 NOTE — Telephone Encounter (Signed)
Lansoprazole, rather than omeprazole

## 2015-09-26 NOTE — Telephone Encounter (Signed)
Hives on Omeprazole

## 2015-09-26 NOTE — Telephone Encounter (Signed)
Left message on her voicemail. Medication sent to [facility] on [address]

## 2015-09-26 NOTE — Telephone Encounter (Signed)
Trial of lansoprazole 30 mg daily before breakfast

## 2015-09-26 NOTE — Telephone Encounter (Signed)
States her GI symptoms are controlled with Pantoprazole, but she has developed joint pain. She has this for over a month now. Certain that the Pantoprazole is causing it. She would like to try a different PPI. She failed Aciphex. Please advise.

## 2015-09-29 ENCOUNTER — Other Ambulatory Visit: Payer: Self-pay | Admitting: Emergency Medicine

## 2015-09-29 DIAGNOSIS — I10 Essential (primary) hypertension: Secondary | ICD-10-CM

## 2015-09-29 MED ORDER — LOSARTAN POTASSIUM 100 MG PO TABS: 100.0000 mg | ORAL_TABLET | Freq: Every day | ORAL | Status: DC

## 2015-10-09 ENCOUNTER — Telehealth: Payer: Self-pay | Admitting: Internal Medicine

## 2015-10-09 NOTE — Telephone Encounter (Signed)
Pt states her BP has been up and she wondered if her prevacid could be causing this. Discussed with pt that it should not be her BP med and that she should contact her PCP office regarding her BP. Pt verbalized understanding.

## 2015-10-20 ENCOUNTER — Telehealth: Payer: Self-pay | Admitting: Internal Medicine

## 2015-10-20 MED ORDER — RANITIDINE HCL 150 MG PO TABS: 150.0000 mg | ORAL_TABLET | Freq: Two times a day (BID) | ORAL | Status: DC

## 2015-10-20 NOTE — Telephone Encounter (Signed)
Pt has been on several PPI meds. States that pantoprazole caused joint pain, omeprazole caused hives, lansoprazole made her BP increase and now states she has had itching with it also. Previous note states pt failed aciphex. Pt is wanting to know what she can take for reflux now. Please advise.

## 2015-10-20 NOTE — Telephone Encounter (Signed)
Ranitidine 150 mg BID-AC in place of PPI

## 2015-10-20 NOTE — Telephone Encounter (Signed)
Pt aware and script sent to pharmacy. 

## 2015-10-22 ENCOUNTER — Telehealth: Payer: Self-pay | Admitting: Internal Medicine

## 2015-10-22 NOTE — Telephone Encounter (Signed)
Pt states her eyes are itchy and she now think the zanctac is causing this reaction. Discussed with pt that all the meds we have tried her on she has stated caused problems. Pt wanted to know if she could start aciphex again that it only bothered her because she was "messing with Chocolate." Suggested pt come and be seen to see what med she may need to try. Pt scheduled to see Alonza Bogus PA 10/28/15@11am . Pt aware of appt. Pt instructed to stop the zantac and see her PCP if she thought the zantac was causing her eyes to itch.

## 2015-10-24 ENCOUNTER — Telehealth: Payer: Self-pay | Admitting: Internal Medicine

## 2015-10-24 ENCOUNTER — Telehealth: Payer: Self-pay | Admitting: Gastroenterology

## 2015-10-24 NOTE — Telephone Encounter (Signed)
Discussed with pt that she needed to continue taking the zantac until she is seen on 10/28/15. Pt has stated in the past that she has had reactions to multiple PPI's that she has been prescribed.

## 2015-10-24 NOTE — Telephone Encounter (Signed)
Patient states she took one Zantac. Per telephone note on 10/22/15, patient was told to stop Zantac until OV on 10/28/15. Patient instructed not to take any more Zantac. Patient instructed to go to ED if she feels her throat is "closing up" or for SOB.

## 2015-10-27 ENCOUNTER — Other Ambulatory Visit (INDEPENDENT_AMBULATORY_CARE_PROVIDER_SITE_OTHER): Payer: 59

## 2015-10-27 ENCOUNTER — Ambulatory Visit (INDEPENDENT_AMBULATORY_CARE_PROVIDER_SITE_OTHER): Payer: 59 | Admitting: Internal Medicine

## 2015-10-27 ENCOUNTER — Encounter: Payer: Self-pay | Admitting: Internal Medicine

## 2015-10-27 VITALS — BP 136/86 | HR 71 | Temp 98.9°F | Resp 16 | Wt 149.0 lb

## 2015-10-27 DIAGNOSIS — Z1159 Encounter for screening for other viral diseases: Secondary | ICD-10-CM

## 2015-10-27 DIAGNOSIS — R7303 Prediabetes: Secondary | ICD-10-CM

## 2015-10-27 DIAGNOSIS — K219 Gastro-esophageal reflux disease without esophagitis: Secondary | ICD-10-CM | POA: Diagnosis not present

## 2015-10-27 DIAGNOSIS — E049 Nontoxic goiter, unspecified: Secondary | ICD-10-CM | POA: Diagnosis not present

## 2015-10-27 DIAGNOSIS — I10 Essential (primary) hypertension: Secondary | ICD-10-CM | POA: Diagnosis not present

## 2015-10-27 DIAGNOSIS — Z114 Encounter for screening for human immunodeficiency virus [HIV]: Secondary | ICD-10-CM

## 2015-10-27 LAB — COMPREHENSIVE METABOLIC PANEL
ALT: 17 U/L (ref 0–35)
AST: 19 U/L (ref 0–37)
Albumin: 4.4 g/dL (ref 3.5–5.2)
Alkaline Phosphatase: 75 U/L (ref 39–117)
BILIRUBIN TOTAL: 0.5 mg/dL (ref 0.2–1.2)
BUN: 12 mg/dL (ref 6–23)
CO2: 33 meq/L — AB (ref 19–32)
CREATININE: 0.74 mg/dL (ref 0.40–1.20)
Calcium: 11.7 mg/dL — ABNORMAL HIGH (ref 8.4–10.5)
Chloride: 101 mEq/L (ref 96–112)
GFR: 101.97 mL/min (ref >60.00)
GLUCOSE: 77 mg/dL (ref 70–99)
Potassium: 4.2 mEq/L (ref 3.5–5.1)
SODIUM: 141 meq/L (ref 135–145)
Total Protein: 8.4 g/dL — ABNORMAL HIGH (ref 6.0–8.3)

## 2015-10-27 LAB — HEMOGLOBIN A1C: Hgb A1c MFr Bld: 5.8 % (ref 4.6–6.5)

## 2015-10-27 LAB — TSH: TSH: 0.59 u[IU]/mL (ref 0.35–4.50)

## 2015-10-27 LAB — HEPATITIS C ANTIBODY: HCV Ab: NEGATIVE

## 2015-10-27 NOTE — Assessment & Plan Note (Signed)
Check TSH 

## 2015-10-27 NOTE — Assessment & Plan Note (Signed)
Check a1c Stressed regular exercise Low sugar/carb diet Discussed her risk of developing diabetes

## 2015-10-27 NOTE — Patient Instructions (Addendum)

## 2015-10-27 NOTE — Progress Notes (Signed)
Pre visit review using our clinic review tool, if applicable. No additional management support is needed unless otherwise documented below in the visit note. 

## 2015-10-27 NOTE — Assessment & Plan Note (Addendum)
Has not tolerated several medications Is following with GI and has an appointment with them tomorrow so I will let them manage Reviewed GERD diet and lifestyle

## 2015-10-27 NOTE — Assessment & Plan Note (Addendum)
BP well controlled Current regimen effective and well tolerated Continue current medications at current doses cmp  

## 2015-10-27 NOTE — Progress Notes (Signed)
Subjective:    Patient ID: Karen Evans, female    DOB: [birth year], 65 y.o.   MRN: [id number]  HPI She is here to establish with a new pcp.  She is here for follow up.  Hypertension: She is taking her medication daily. She is compliant with a low sodium diet.  She denies chest pain, edema, shortness of breath and regular headaches. She has occasional palpitations, which are likely PACs seen on a prior EKG.  She is active, but not exercising regularly.  She does monitor her blood pressure on occasion and it has been good.    GERD:  She is taking any medication - she had side effects to most medications.  She feels heartburn and is following with GI and has an appointment tomorrow.   Prediabetes:  She is compliant with a low sugar/carbohydrate diet.  She is very active, but not exercising regularly.  Goiter:  She denies any obvious changes in the size of her thyroid.   Medications and allergies reviewed with patient and updated if appropriate.  Patient Active Problem List   Diagnosis Date Noted  . Hx of adenomatous colonic polyps 10/12/2013  . Constipation 10/12/2013  . Dyspnea 07/23/2013  . Other and unspecified hyperlipidemia 03/29/2011  . GERD (gastroesophageal reflux disease) 02/18/2011  . 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 VITAMIN D DEFICIENCY 03/26/2010  . INVOLUNTARY MOVEMENTS, ABNORMAL 11/27/2009  . Hyperglycemia 12/31/2008  . IRRITABLE BOWEL SYNDROME 04/25/2008  . REFLUX ESOPHAGITIS 04/24/2008  . HIATAL HERNIA 04/24/2008  . RHINITIS 03/14/2008  . History of uterine fibroid 12/29/2007  . GOITER, DIFFUSE 12/29/2007  . Carpal tunnel syndrome 12/29/2007  . HYPERTENSION 12/29/2007  . ANEMIA-NOS 07/03/2006    Current Outpatient Prescriptions on File Prior to Visit  Medication Sig Dispense Refill  . Calcium Carbonate-Vitamin D (CALTRATE 600+D PO) Take 1 tablet by mouth 2 (two) times daily.    . Cholecalciferol (VITAMIN D PO) Take 1 tablet by mouth daily.     . fexofenadine (ALLEGRA) 180 MG tablet Take 180 mg by mouth daily as needed.     . Lactase (LACTAID PO) Take 1 tablet by mouth as needed.    . lansoprazole (PREVACID) 30 MG capsule Take 1 capsule (30 mg total) by mouth daily at 12 noon. 30 capsule 3  . losartan (COZAAR) 100 MG tablet Take 1 tablet (100 mg total) by mouth daily. 90 tablet 0  . Multiple Vitamin (MULTIVITAMIN) capsule Take 1 capsule by mouth daily.    . ranitidine (ZANTAC) 150 MG tablet Take 1 tablet (150 mg total) by mouth 2 (two) times daily. 60 tablet 1   No current facility-administered medications on file prior to visit.    Past Medical History  Diagnosis Date  . Anemia     PMH of   . Fibroid, uterine     Dr Garwin Brothers; G 0 P 0  . Crohn's     ileitis/mild; Dr Sharlett Iles  . Rectal polyp 01/24/2012    TUBULAR ADENOMA  . Hypertension   . Goiter     CTS,RUE > LUE  . Vitamin D deficiency   . Reflux esophagitis 2007    EGD  . Stricture and stenosis of esophagus 2007    EGD  . Duodenitis without mention of hemorrhage 2007    EGD  . Hiatal hernia 2002    EGD  . Allergy   . GERD (gastroesophageal reflux disease)   . Ulcer   . Colon polyp     Past  Surgical History  Procedure Laterality Date  . Operative hysteroscopy  08-05-04    Dr Garwin Brothers  . Hemorrhoid surgery    . Colonoscopy      polyps;granular ileitis 2006;2007 normal, Dr Sharlett Iles  . Ovarian cyst removal    . Upper gastrointestinal endoscopy      Social History   Social History  . Marital Status: Single    Spouse Name: N/A  . Number of Children: 0  . Years of Education: N/A   Occupational History  . TESTER [employer]   Social History Main Topics  . Smoking status: Never Smoker   . Smokeless tobacco: Never Used  . Alcohol Use: No  . Drug Use: No  . Sexual Activity: Not on file   Other Topics Concern  . Not on file   Social History Narrative    Family History  Problem Relation Age of Onset  . Lymphoma Father   . Diabetes  Mother   . Hypertension Mother   . Heart disease Mother   . Penile cancer Paternal Grandfather   . Diabetes Brother     CAD; Dialysis  . Hypertension Brother   . Heart attack      1/2 maternal aunts X 2  . Rectal cancer Paternal Grandmother   . COPD Neg Hx   . Colon cancer Neg Hx   . Esophageal cancer Neg Hx   . Stomach cancer Neg Hx   . Stroke Neg Hx     Review of Systems  Constitutional: Negative for fever.       Hot flashes  Respiratory: Negative for cough, shortness of breath and wheezing.   Cardiovascular: Positive for palpitations (occasional). Negative for chest pain and leg swelling.  Gastrointestinal: Positive for abdominal pain.       GERD  Neurological: Negative for dizziness, light-headedness and headaches.       Objective:   Filed Vitals:   10/27/15 1037  BP: 136/86  Pulse: 71  Temp: 98.9 F (37.2 C)  Resp: 16   Filed Weights   10/27/15 1037  Weight: 149 lb (67.586 kg)   Body mass index is 26.4 kg/(m^2).   Physical Exam Constitutional: Appears well-developed and well-nourished. No distress.  Neck: Neck supple. No tracheal deviation present.  thyromegaly present.  No carotid bruit. No cervical adenopathy.   Cardiovascular: Normal rate, regular rhythm and normal heart sounds.   No murmur heard.  No edema Pulmonary/Chest: Effort normal and breath sounds normal. No respiratory distress. No wheezes.       Assessment & Plan:   See Problem List for Assessment and Plan of chronic medical problems.  F/u in 6 months

## 2015-10-28 ENCOUNTER — Encounter: Payer: Self-pay | Admitting: Gastroenterology

## 2015-10-28 ENCOUNTER — Ambulatory Visit (INDEPENDENT_AMBULATORY_CARE_PROVIDER_SITE_OTHER): Payer: 59 | Admitting: Gastroenterology

## 2015-10-28 VITALS — BP 124/72 | HR 72 | Ht 62.0 in | Wt 148.1 lb

## 2015-10-28 DIAGNOSIS — K219 Gastro-esophageal reflux disease without esophagitis: Secondary | ICD-10-CM

## 2015-10-28 MED ORDER — RABEPRAZOLE SODIUM 20 MG PO TBEC: 20.0000 mg | DELAYED_RELEASE_TABLET | Freq: Every day | ORAL | Status: DC

## 2015-10-28 NOTE — Progress Notes (Addendum)
     10/28/2015 Karen Evans [id number] July 01, 1950   History of Present Illness:  This is a very strange 65 year old female who is known to Dr. Hilarie Evans for IBS and GERD. She is a very poor historian.  She was last seen one year ago by another one of our physician assistants. Her reflux had overall been controlled on AcipHex 20 mg daily, but that was changed at that last appointment due to concern that it was no longer working. She was placed on pantoprazole 40 mg daily.  She then called our office last month, which was 11 months after starting the pantoprazole, stating that she thought it was causing joint pain. That was switched to Prevacid, but she called back again stating she thought it was causing her high blood pressure. She then was placed on Zantac but called saying her eyes were itchy and she thought it was reaction to that. She also has listed that she had hives from Nexium. She is here today to discuss restarting the AcipHex for her acid reflux.  She says that she eats and chocolate at work recently and feels that that set her reflux symptoms off again.  She did have an EGD in September 2013 at which time she was found have a small hiatal hernia and she was Karen Evans dilated but otherwise exam was negative.   Current Medications, Allergies, Past Medical History, Past Surgical History, Family History and Social History were reviewed in [employer] record.   Physical Exam: BP 124/72 mmHg  Pulse 72  Ht 5\' 2"  (1.575 m)  Wt 148 lb 2 oz (67.189 kg)  BMI 27.09 kg/m2 General: Well developed black female in no acute distress Head: Normocephalic and atraumatic Eyes:  Sclerae anicteric, conjunctiva pink  Ears: Normal auditory acuity Lungs: Clear throughout to auscultation Heart: Regular rate and rhythm Abdomen: Soft, non-distended.  Normal bowel sounds.  Non-tender. Musculoskeletal: Symmetrical with no gross deformities  Extremities: No edema  Neurological: Alert  oriented x 4, grossly non-focal Psychological:  Alert and cooperative. Normal mood and affect  Assessment and Recommendations: -GERD:  Intolerant to several PPI's and H2 blockers.  Would like to re-try aciphex 20 mg daily.  Will prescribe this again for now.   -Personal history of adenomatous polyps:  Due for surveillance colonoscopy 01/2017.  Addendum: Reviewed and agree with management. Karen Bears, MD

## 2015-10-28 NOTE — Patient Instructions (Signed)
We sent a prescription to [facility], [address] for Aciphex 20 mg. Let us know if this helps your symptoms.

## 2015-10-29 ENCOUNTER — Telehealth: Payer: Self-pay

## 2015-10-29 ENCOUNTER — Telehealth: Payer: Self-pay | Admitting: Gastroenterology

## 2015-10-29 NOTE — Telephone Encounter (Signed)
Patient called and did not want me to inform her of her kabs. Only wanted taylor to call her back. Please follow up.

## 2015-10-30 NOTE — Telephone Encounter (Signed)
Spoke with pt to inform of results.

## 2015-11-07 ENCOUNTER — Encounter: Payer: Self-pay | Admitting: Podiatry

## 2015-11-07 ENCOUNTER — Ambulatory Visit (INDEPENDENT_AMBULATORY_CARE_PROVIDER_SITE_OTHER): Payer: 59 | Admitting: Podiatry

## 2015-11-07 VITALS — BP 147/83 | HR 80 | Resp 16

## 2015-11-07 DIAGNOSIS — M79675 Pain in left toe(s): Secondary | ICD-10-CM

## 2015-11-07 DIAGNOSIS — L600 Ingrowing nail: Secondary | ICD-10-CM | POA: Diagnosis not present

## 2015-11-07 DIAGNOSIS — L603 Nail dystrophy: Secondary | ICD-10-CM | POA: Diagnosis not present

## 2015-11-07 DIAGNOSIS — B351 Tinea unguium: Secondary | ICD-10-CM | POA: Diagnosis not present

## 2015-11-07 DIAGNOSIS — M79674 Pain in right toe(s): Secondary | ICD-10-CM | POA: Diagnosis not present

## 2015-11-07 DIAGNOSIS — M79604 Pain in right leg: Secondary | ICD-10-CM

## 2015-11-07 DIAGNOSIS — M79605 Pain in left leg: Secondary | ICD-10-CM

## 2015-11-07 NOTE — Patient Instructions (Signed)

## 2015-11-07 NOTE — Progress Notes (Signed)
   Subjective:    Patient ID: Karen Evans, female    DOB: [birth year], 65 y.o.   MRN: [id number]  HPI Chief Complaint  Patient presents with  . Nail Problem    Toenails bilateral - thick, discolored nails, treated ingrown toenails last time here-no other home treatment      Review of Systems  All other systems reviewed and are negative.      Objective:   Physical Exam        Assessment & Plan:

## 2015-11-09 NOTE — Progress Notes (Signed)
Subjective:     Patient ID: Karen Evans, female   DOB: 17-Nov-1950, 65 y.o.   MRN: [id number]  HPI patient states she is concerned about pain in the left second nail with incurvation of the border and thickness of the remaining nailbeds   Review of Systems  All other systems reviewed and are negative.      Objective:   Physical Exam  Constitutional: She is oriented to person, place, and time.  Cardiovascular: Intact distal pulses.   Musculoskeletal: Normal range of motion.  Neurological: She is oriented to person, place, and time.  Skin: Skin is warm.  Nursing note and vitals reviewed.  Neurovascular status intact muscle strength adequate with patient noted to have a deformed thickened left hallux nail that's incurvated on the medial side and damage to the hallux nails bilateral with incurvation that's not as painful. Patient's found have good digital perfusion and is well oriented 3    Assessment:     Ingrown second nail left medial side and damage nailbeds in general    Plan:     H&P condition reviewed. At this point I recommended removal of the nail corner left second digit explaining procedure and risk. I infiltrated 60 mg I can Marcaine mixture remove the medial side exposed matrix and applied phenol 3 applications 30 seconds followed by alcohol lavage and sterile dressing I then debrided remaining nails

## 2015-11-10 ENCOUNTER — Telehealth: Payer: Self-pay | Admitting: *Deleted

## 2015-11-10 NOTE — Telephone Encounter (Signed)
Pt states wear a steel toed shoe 8.5 hours a day would that be okay, because her foot couldn't breathe, and is it okay to use Dial sensitive skin soap?  I told pt it would be fine to wear the steel toed shoes, to keep the toe covered with a antibiotic ointment dressing and the dial soap or epsom salt soak would work fine.  Pt states understanding.

## 2015-12-17 ENCOUNTER — Other Ambulatory Visit: Payer: Self-pay | Admitting: Internal Medicine

## 2015-12-17 DIAGNOSIS — I10 Essential (primary) hypertension: Secondary | ICD-10-CM

## 2015-12-23 ENCOUNTER — Other Ambulatory Visit: Payer: Self-pay

## 2015-12-23 MED ORDER — RABEPRAZOLE SODIUM 20 MG PO TBEC: 20.0000 mg | DELAYED_RELEASE_TABLET | Freq: Every day | ORAL | 1 refills | Status: DC

## 2016-01-14 ENCOUNTER — Ambulatory Visit (INDEPENDENT_AMBULATORY_CARE_PROVIDER_SITE_OTHER): Payer: 59 | Admitting: Nurse Practitioner

## 2016-01-14 ENCOUNTER — Encounter: Payer: Self-pay | Admitting: Nurse Practitioner

## 2016-01-14 VITALS — BP 166/94 | HR 70 | Temp 98.4°F | Ht 63.0 in | Wt 146.0 lb

## 2016-01-14 DIAGNOSIS — I10 Essential (primary) hypertension: Secondary | ICD-10-CM | POA: Diagnosis not present

## 2016-01-14 MED ORDER — LOSARTAN POTASSIUM 100 MG PO TABS: 100.0000 mg | ORAL_TABLET | Freq: Every day | ORAL | 1 refills | Status: DC

## 2016-01-14 MED ORDER — HYDROCHLOROTHIAZIDE 12.5 MG PO CAPS: 12.5000 mg | ORAL_CAPSULE | Freq: Every day | ORAL | 1 refills | Status: DC

## 2016-01-14 NOTE — Progress Notes (Signed)
Pre visit review using our clinic review tool, if applicable. No additional management support is needed unless otherwise documented below in the visit note. 

## 2016-01-14 NOTE — Progress Notes (Addendum)
Subjective:  Patient ID: Karen Evans, female    DOB: 1951-04-16  Age: 65 y.o. MRN: [id number]  CC: Hypertension   Maevis P Negrete presents for elevated BP with new brand of BP medication. Brand was switched by pharmacy. She want prescription swithed to [facility] who dispenses old brand of losartan.  has follow home readings: 140/70, 160/80, 146/76. Hypertension  This is a chronic problem. The current episode started 1 to 4 weeks ago. The problem has been gradually worsening since onset. The problem is controlled. Associated symptoms include headaches. Pertinent negatives include no blurred vision, chest pain, palpitations, peripheral edema or PND. Associated agents: new brand of losartan. Risk factors for coronary artery disease include dyslipidemia. Past treatments include ACE inhibitors and angiotensin blockers (developed cough with benazepril). There are no compliance problems.    BP Readings from Last 3 Encounters:  01/14/16 (!) 166/94  11/07/15 (!) 147/83  10/28/15 124/72   Outpatient Medications Prior to Visit  Medication Sig Dispense Refill  . Calcium Carbonate-Vitamin D (CALTRATE 600+D PO) Take 1 tablet by mouth 2 (two) times daily.    . Cholecalciferol (VITAMIN D PO) Take 1 tablet by mouth daily.    . fexofenadine (ALLEGRA) 180 MG tablet Take 180 mg by mouth daily as needed.     . Lactase (LACTAID PO) Take 1 tablet by mouth as needed.    . Multiple Vitamin (MULTIVITAMIN) capsule Take 1 capsule by mouth daily.    Marland Kitchen nystatin-triamcinolone ointment (MYCOLOG) Apply 1 application topically 2 (two) times daily.    . RABEprazole (ACIPHEX) 20 MG tablet Take 1 tablet (20 mg total) by mouth daily. 90 tablet 1  . losartan (COZAAR) 100 MG tablet TAKE 1 TABLET DAILY 90 tablet 1   No facility-administered medications prior to visit.     ROS See HPI  Objective:  BP (!) 166/94 (BP Location: Left Arm, Patient Position: Sitting, Cuff Size: Normal)   Pulse 70   Temp 98.4 F (36.9 C)  (Oral)   Ht 5\' 3"  (1.6 m)   Wt 146 lb (66.2 kg)   SpO2 98%   BMI 25.86 kg/m   BP Readings from Last 3 Encounters:  01/14/16 (!) 166/94  11/07/15 (!) 147/83  10/28/15 124/72    Wt Readings from Last 3 Encounters:  01/14/16 146 lb (66.2 kg)  10/28/15 148 lb 2 oz (67.2 kg)  10/27/15 149 lb (67.6 kg)    Physical Exam  Constitutional: She is oriented to person, place, and time. She appears well-developed.  Neck: Normal range of motion. Neck supple.  Cardiovascular: Normal rate and normal heart sounds.   Pulmonary/Chest: Effort normal and breath sounds normal.  Musculoskeletal: Normal range of motion. She exhibits no edema.  Neurological: She is alert and oriented to person, place, and time.  Skin: Skin is warm and dry.  Vitals reviewed.   Lab Results  Component Value Date   WBC 7.9 10/31/2014   HGB 12.2 10/31/2014   HCT 37.9 10/31/2014   PLT 269.0 10/31/2014   GLUCOSE 77 10/27/2015   CHOL 240 (H) 04/18/2014   TRIG 136.0 04/18/2014   HDL 59.70 04/18/2014   LDLDIRECT 142.5 04/02/2013   LDLCALC 153 (H) 04/18/2014   ALT 17 10/27/2015   AST 19 10/27/2015   NA 141 10/27/2015   K 4.2 10/27/2015   CL 101 10/27/2015   CREATININE 0.74 10/27/2015   BUN 12 10/27/2015   CO2 33 (H) 10/27/2015   TSH 0.59 10/27/2015   HGBA1C 5.8 10/27/2015  CMP     Component Value Date/Time   NA 141 10/27/2015 1157   K 4.2 10/27/2015 1157   CL 101 10/27/2015 1157   CO2 33 (H) 10/27/2015 1157   GLUCOSE 77 10/27/2015 1157   BUN 12 10/27/2015 1157   CREATININE 0.74 10/27/2015 1157   CALCIUM 11.7 (H) 10/27/2015 1157   PROT 8.4 (H) 10/27/2015 1157   ALBUMIN 4.4 10/27/2015 1157   AST 19 10/27/2015 1157   ALT 17 10/27/2015 1157   ALKPHOS 75 10/27/2015 1157   BILITOT 0.5 10/27/2015 1157   GFRNONAA 118.58 03/26/2010 1024   GFRAA 96 04/25/2008 1538   Assessment & Plan:   Sharn was seen today for hypertension.  Diagnoses and all orders for this visit:  Essential hypertension -      hydrochlorothiazide (MICROZIDE) 12.5 MG capsule; Take 1 capsule (12.5 mg total) by mouth daily. -     losartan (COZAAR) 100 MG tablet; Take 1 tablet (100 mg total) by mouth daily.   I have changed Ms. Petrea's losartan. I am also having her start on hydrochlorothiazide. Additionally, I am having her maintain her fexofenadine, Calcium Carbonate-Vitamin D (CALTRATE 600+D PO), Cholecalciferol (VITAMIN D PO), multivitamin, Lactase (LACTAID PO), nystatin-triamcinolone ointment, and RABEprazole.  Meds ordered this encounter  Medications  . hydrochlorothiazide (MICROZIDE) 12.5 MG capsule    Sig: Take 1 capsule (12.5 mg total) by mouth daily.    Dispense:  30 capsule    Refill:  1    Order Specific Question:   Supervising Provider    Answer:   Cassandria Anger [[id number]]  . losartan (COZAAR) 100 MG tablet    Sig: Take 1 tablet (100 mg total) by mouth daily.    Dispense:  90 tablet    Refill:  1    Order Specific Question:   Supervising Provider    Answer:   Cassandria Anger [[id number]]    Follow-up: Return in about 4 weeks (around 02/11/2016) for HTN.  Wilfred Lacy, NP

## 2016-01-16 ENCOUNTER — Telehealth: Payer: Self-pay | Admitting: Internal Medicine

## 2016-01-16 NOTE — Telephone Encounter (Signed)
Spoke with pt to clarify.  

## 2016-01-16 NOTE — Telephone Encounter (Signed)
Pt called in and has a few questions about her meds.  She is requesting a call back from a nurse.

## 2016-01-16 NOTE — Telephone Encounter (Signed)
Spoke to pt. I have advised that she be seen sooner than the 02/09/16 appt that is currently scheduled. I have her sched to come in on 01/20/16 to see Nche.

## 2016-01-20 ENCOUNTER — Encounter: Payer: Self-pay | Admitting: Nurse Practitioner

## 2016-01-20 ENCOUNTER — Other Ambulatory Visit (INDEPENDENT_AMBULATORY_CARE_PROVIDER_SITE_OTHER): Payer: 59

## 2016-01-20 ENCOUNTER — Ambulatory Visit (INDEPENDENT_AMBULATORY_CARE_PROVIDER_SITE_OTHER): Payer: 59 | Admitting: Nurse Practitioner

## 2016-01-20 VITALS — BP 118/82 | HR 80 | Wt 146.0 lb

## 2016-01-20 DIAGNOSIS — I10 Essential (primary) hypertension: Secondary | ICD-10-CM | POA: Diagnosis not present

## 2016-01-20 LAB — BASIC METABOLIC PANEL
BUN: 12 mg/dL (ref 6–23)
CALCIUM: 9.7 mg/dL (ref 8.4–10.5)
CO2: 30 mEq/L (ref 19–32)
CREATININE: 0.78 mg/dL (ref 0.40–1.20)
Chloride: 101 mEq/L (ref 96–112)
GFR: 95.89 mL/min (ref >60.00)
Glucose, Bld: 91 mg/dL (ref 70–99)
Potassium: 3.7 mEq/L (ref 3.5–5.1)
Sodium: 138 mEq/L (ref 135–145)

## 2016-01-20 NOTE — Patient Instructions (Addendum)
Continue current BP medications (losartan and HCTZ). Will call with lab results Maintain upcoming appt with Dr. Quay Burow

## 2016-01-20 NOTE — Progress Notes (Signed)
Subjective:  Patient ID: Karen Evans, female    DOB: May 24, 1950  Age: 65 y.o. MRN: [id number]  CC: Dizziness and Hypertension   Karen Evans presents for dizziness and calf pain.  Will also like to discuss BP medication. No syncope. No Cp, no edema, no SOB.  Dizziness  This is a new problem. The current episode started in the past 7 days. The problem occurs intermittently. The problem has been waxing and waning. Associated symptoms include fatigue and weakness. Pertinent negatives include no anorexia, chest pain, congestion, fever, myalgias, nausea, rash, sore throat, swollen glands, vertigo or visual change. Exacerbated by: rapid head movement. She has tried nothing for the symptoms.   HTN: Taking Losartan and HCTZ as prescribed. Developed dizziness, weakness and calf pain after HCTZ for a few days.  Outpatient Medications Prior to Visit  Medication Sig Dispense Refill  . Calcium Carbonate-Vitamin D (CALTRATE 600+D PO) Take 1 tablet by mouth 2 (two) times daily.    . Cholecalciferol (VITAMIN D PO) Take 1 tablet by mouth daily.    . fexofenadine (ALLEGRA) 180 MG tablet Take 180 mg by mouth daily as needed.     . hydrochlorothiazide (MICROZIDE) 12.5 MG capsule Take 1 capsule (12.5 mg total) by mouth daily. 30 capsule 1  . Lactase (LACTAID PO) Take 1 tablet by mouth as needed.    Marland Kitchen losartan (COZAAR) 100 MG tablet Take 1 tablet (100 mg total) by mouth daily. 90 tablet 1  . Multiple Vitamin (MULTIVITAMIN) capsule Take 1 capsule by mouth daily.    Marland Kitchen nystatin-triamcinolone ointment (MYCOLOG) Apply 1 application topically 2 (two) times daily.    . RABEprazole (ACIPHEX) 20 MG tablet Take 1 tablet (20 mg total) by mouth daily. 90 tablet 1   No facility-administered medications prior to visit.     ROS See HPI  Objective:  BP 118/82   Pulse 80   Wt 146 lb (66.2 kg)   SpO2 97%   BMI 25.86 kg/m   BP Readings from Last 3 Encounters:  01/20/16 118/82  01/14/16 (!) 166/94    11/07/15 (!) 147/83    Wt Readings from Last 3 Encounters:  01/20/16 146 lb (66.2 kg)  01/14/16 146 lb (66.2 kg)  10/28/15 148 lb 2 oz (67.2 kg)    Physical Exam  Constitutional: She is oriented to person, place, and time. No distress.  Neck: Normal range of motion. Neck supple. No thyromegaly present.  Cardiovascular: Normal rate and normal heart sounds.   Pulmonary/Chest: Effort normal and breath sounds normal.  Musculoskeletal: She exhibits no edema.  Lymphadenopathy:    She has no cervical adenopathy.  Neurological: She is alert and oriented to person, place, and time.  Normal gait  Skin: Skin is warm and dry.  Vitals reviewed.   Recent Results (from the past 2160 hour(s))  Comprehensive metabolic panel     Status: Abnormal   Collection Time: 10/27/15 11:57 AM  Result Value Ref Range   Sodium 141 135 - 145 mEq/L   Potassium 4.2 3.5 - 5.1 mEq/L   Chloride 101 96 - 112 mEq/L   CO2 33 (H) 19 - 32 mEq/L   Glucose, Bld 77 70 - 99 mg/dL   BUN 12 6 - 23 mg/dL   Creatinine, Ser 0.74 0.40 - 1.20 mg/dL   Total Bilirubin 0.5 0.2 - 1.2 mg/dL   Alkaline Phosphatase 75 39 - 117 U/L   AST 19 0 - 37 U/L   ALT 17 0 - 35  U/L   Total Protein 8.4 (H) 6.0 - 8.3 g/dL   Albumin 4.4 3.5 - 5.2 g/dL   Calcium 11.7 (H) 8.4 - 10.5 mg/dL   GFR 101.97 >60.00 mL/min  Hemoglobin A1c     Status: None   Collection Time: 10/27/15 11:57 AM  Result Value Ref Range   Hgb A1c MFr Bld 5.8 4.6 - 6.5 %    Comment: Glycemic Control Guidelines for People with Diabetes:Non Diabetic:  <6%Goal of Therapy: <7%Additional Action Suggested:  >8%   Hepatitis C antibody     Status: None   Collection Time: 10/27/15 11:57 AM  Result Value Ref Range   HCV Ab NEGATIVE NEGATIVE  TSH     Status: None   Collection Time: 10/27/15 11:57 AM  Result Value Ref Range   TSH 0.59 0.35 - 4.50 uIU/mL  Basic Metabolic Panel (BMET)     Status: None   Collection Time: 01/20/16  9:27 AM  Result Value Ref Range   Sodium 138  135 - 145 mEq/L   Potassium 3.7 3.5 - 5.1 mEq/L   Chloride 101 96 - 112 mEq/L   CO2 30 19 - 32 mEq/L   Glucose, Bld 91 70 - 99 mg/dL   BUN 12 6 - 23 mg/dL   Creatinine, Ser 0.78 0.40 - 1.20 mg/dL   Calcium 9.7 8.4 - 10.5 mg/dL   GFR 95.89 >60.00 mL/min    Assessment & Plan:   Karen Evans was seen today for dizziness and hypertension.  Diagnoses and all orders for this visit:  Essential hypertension -     Basic Metabolic Panel (BMET); Future   I am having [name] maintain her fexofenadine, Calcium Carbonate-Vitamin D (CALTRATE 600+D PO), Cholecalciferol (VITAMIN D PO), multivitamin, Lactase (LACTAID PO), nystatin-triamcinolone ointment, RABEprazole, hydrochlorothiazide, and losartan.  No orders of the defined types were placed in this encounter.   Follow-up: Return if symptoms worsen or fail to improve.  Wilfred Lacy, NP

## 2016-01-20 NOTE — Progress Notes (Signed)
Pre visit review using our clinic review tool, if applicable. No additional management support is needed unless otherwise documented below in the visit note. 

## 2016-01-30 ENCOUNTER — Telehealth: Payer: Self-pay

## 2016-01-30 NOTE — Telephone Encounter (Signed)
Advised patient of charlottes note, patient stated she will increase dosage over the weekend and see if she is any better, then will call back on Monday if no better by then to make another office visit

## 2016-01-30 NOTE — Telephone Encounter (Signed)
Routing to charlotte----please advise, I will call patient back---thanks

## 2016-01-30 NOTE — Telephone Encounter (Signed)
Blood pressure medication prescribed does not typically cause acid reflux. She is currently taking AcipHex for GERD, she can increase dose to 20 mg twice a day. She is to return to office if symptoms do not improve in 1 week, otherwise maintaining upcoming appointment with Dr. Quay Burow. Thank you

## 2016-01-30 NOTE — Telephone Encounter (Signed)
Patient called and said that the bp medication Baldo Ash put her on seems to be working and her bp has been fine. But since she started taking it she said she seem to be having problems with acid reflux and she would like to know if there is anything you can do about that. Please follow up.

## 2016-02-02 NOTE — Telephone Encounter (Signed)
Talked with patient---she was unsure how to space 20mg  tabs apart---I have advised that ideal way to take is by taking 20mg  twice daily appx 12 hours apart---if she needs to take 2nd pill a little sooner is perfectly ok---and it may need to be adjusted according to how she is feeling ---reminded patient to call us at end of week if not feeling any better, she may need to be seen before 9/25---but very important for her to keep 9/25 appt with charlotte, and also appt with dr burns in dec/2017
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
## 2016-02-06 ENCOUNTER — Telehealth: Payer: Self-pay | Admitting: Emergency Medicine

## 2016-02-06 NOTE — Telephone Encounter (Signed)
Does she check her BP - how has it been?  We can change the losartan to something else - if we do she will need to come in for a follow up to recheck her BP on the new medication after two weeks of making a change.

## 2016-02-06 NOTE — Telephone Encounter (Signed)
Please advise 

## 2016-02-06 NOTE — Telephone Encounter (Signed)
Spoke with pt, she was hard to follow as far as if she was feeling better or not. She states that she will hold off on having a new med sent to [facility] and will try taking the bp med and water pill separate bc that seems to make her feel better. She has an appt with Baldo Ash, NP on 02/09/16

## 2016-02-06 NOTE — Telephone Encounter (Signed)
Pt called and stated she has been taking losartan (COZAAR) 100 MG tablet and is not feeling the same. She said its not doing what it did before and wants to know what you recommend doing? Please advise thanks.

## 2016-02-09 ENCOUNTER — Encounter: Payer: Self-pay | Admitting: Family Medicine

## 2016-02-09 ENCOUNTER — Ambulatory Visit: Payer: 59 | Admitting: Nurse Practitioner

## 2016-02-09 ENCOUNTER — Ambulatory Visit (INDEPENDENT_AMBULATORY_CARE_PROVIDER_SITE_OTHER): Payer: 59 | Admitting: Family Medicine

## 2016-02-09 VITALS — BP 120/72 | HR 62 | Temp 98.7°F | Ht 63.0 in | Wt 145.5 lb

## 2016-02-09 DIAGNOSIS — I10 Essential (primary) hypertension: Secondary | ICD-10-CM

## 2016-02-09 DIAGNOSIS — K219 Gastro-esophageal reflux disease without esophagitis: Secondary | ICD-10-CM | POA: Diagnosis not present

## 2016-02-09 NOTE — Patient Instructions (Addendum)
Please continue medication as as prescribed and follow up with Dr. Quay Burow as scheduled. Also, please monitor your BP at work or home and document readings for Dr. Quay Burow. See information below for BP readings.   Minimal Blood Pressure Goal= AVERAGE < 140/90; Ideal is an AVERAGE < 135/85. This AVERAGE should be calculated from @ least 5-7 BP readings taken @ different times of day on different days of week. You should not respond to isolated BP readings , but rather the AVERAGE for that week .Please bring your blood pressure cuff to office visits to verify that it is reliable.It can also be checked against the blood pressure device at the pharmacy. Finger or wrist cuffs are not dependable; an arm cuff is. DASH Eating Plan DASH stands for "Dietary Approaches to Stop Hypertension." The DASH eating plan is a healthy eating plan that has been shown to reduce high blood pressure (hypertension). Additional health benefits may include reducing the risk of type 2 diabetes mellitus, heart disease, and stroke. The DASH eating plan may also help with weight loss. WHAT DO I NEED TO KNOW ABOUT THE DASH EATING PLAN? For the DASH eating plan, you will follow these general guidelines:  Choose foods with a percent daily value for sodium of less than 5% (as listed on the food label).  Use salt-free seasonings or herbs instead of table salt or sea salt.  Check with your health care provider or pharmacist before using salt substitutes.  Eat lower-sodium products, often labeled as "lower sodium" or "no salt added."  Eat fresh foods.  Eat more vegetables, fruits, and low-fat dairy products.  Choose whole grains. Look for the word "whole" as the first word in the ingredient list.  Choose fish and skinless chicken or [place] more often than red meat. Limit fish, poultry, and meat to 6 oz (170 g) each day.  Limit sweets, desserts, sugars, and sugary drinks.  Choose heart-healthy fats.  Limit cheese to 1 oz (28 g)  per day.  Eat more home-cooked food and less restaurant, buffet, and fast food.  Limit fried foods.  Cook foods using methods other than frying.  Limit canned vegetables. If you do use them, rinse them well to decrease the sodium.  When eating at a restaurant, ask that your food be prepared with less salt, or no salt if possible. WHAT FOODS CAN I EAT? Seek help from a dietitian for individual calorie needs. Grains Whole grain or whole wheat bread. Brown rice. Whole grain or whole wheat pasta. Quinoa, bulgur, and whole grain cereals. Low-sodium cereals. Corn or whole wheat flour tortillas. Whole grain cornbread. Whole grain crackers. Low-sodium crackers. Vegetables Fresh or frozen vegetables (raw, steamed, roasted, or grilled). Low-sodium or reduced-sodium tomato and vegetable juices. Low-sodium or reduced-sodium tomato sauce and paste. Low-sodium or reduced-sodium canned vegetables.  Fruits All fresh, canned (in natural juice), or frozen fruits. Meat and Other Protein Products Ground beef (85% or leaner), grass-fed beef, or beef trimmed of fat. Skinless chicken or [place]. Ground chicken or [place]. Pork trimmed of fat. All fish and seafood. Eggs. Dried beans, peas, or lentils. Unsalted nuts and seeds. Unsalted canned beans. Dairy Low-fat dairy products, such as skim or 1% milk, 2% or reduced-fat cheeses, low-fat ricotta or cottage cheese, or plain low-fat yogurt. Low-sodium or reduced-sodium cheeses. Fats and Oils Tub margarines without trans fats. Light or reduced-fat mayonnaise and salad dressings (reduced sodium). Avocado. Safflower, olive, or canola oils. Natural peanut or almond butter. Other Unsalted popcorn and pretzels. The  items listed above may not be a complete list of recommended foods or beverages. Contact your dietitian for more options. WHAT FOODS ARE NOT RECOMMENDED? Grains White bread. White pasta. White rice. Refined cornbread. Bagels and croissants. Crackers that  contain trans fat. Vegetables Creamed or fried vegetables. Vegetables in a cheese sauce. Regular canned vegetables. Regular canned tomato sauce and paste. Regular tomato and vegetable juices. Fruits Dried fruits. Canned fruit in light or heavy syrup. Fruit juice. Meat and Other Protein Products Fatty cuts of meat. Ribs, chicken wings, bacon, sausage, bologna, salami, chitterlings, fatback, hot dogs, bratwurst, and packaged luncheon meats. Salted nuts and seeds. Canned beans with salt. Dairy Whole or 2% milk, cream, half-and-half, and cream cheese. Whole-fat or sweetened yogurt. Full-fat cheeses or blue cheese. Nondairy creamers and whipped toppings. Processed cheese, cheese spreads, or cheese curds. Condiments Onion and garlic salt, seasoned salt, table salt, and sea salt. Canned and packaged gravies. Worcestershire sauce. Tartar sauce. Barbecue sauce. Teriyaki sauce. Soy sauce, including reduced sodium. Steak sauce. Fish sauce. Oyster sauce. Cocktail sauce. Horseradish. Ketchup and mustard. Meat flavorings and tenderizers. Bouillon cubes. Hot sauce. Tabasco sauce. Marinades. Taco seasonings. Relishes. Fats and Oils Butter, stick margarine, lard, shortening, ghee, and bacon fat. Coconut, palm kernel, or palm oils. Regular salad dressings. Other Pickles and olives. Salted popcorn and pretzels. The items listed above may not be a complete list of foods and beverages to avoid. Contact your dietitian for more information. WHERE CAN I FIND MORE INFORMATION? National Heart, Lung, and Blood Institute: travelstabloid.com   This information is not intended to replace advice given to you by your health care provider. Make sure you discuss any questions you have with your health care provider.   Document Released: 04/22/2011 Document Revised: 05/24/2014 Document Reviewed: 03/07/2013 Elsevier Interactive Patient Education [employer].  It was a pleasure to meet you  today!

## 2016-02-09 NOTE — Progress Notes (Signed)
Pre visit review using our clinic review tool, if applicable. No additional management support is needed unless otherwise documented below in the visit note. 

## 2016-02-09 NOTE — Progress Notes (Signed)
Subjective:    Patient ID: Karen Evans, female    DOB: 1950-08-13, 65 y.o.   MRN: [id number]  HPI  Karen Evans is a 65 year old female who presents today for evaluation of her blood pressure. She has recently been prescribed hydrochlorothiazide in addition to her losartan and she reports adherence to this medication.  She was started on 01/14/16, HCTZ however it is unclear when she actually started taking this medication as she reports sending this to a mail order pharmacy instead of picking this up locally. Unfortunately, she is a poor historian related to timing of this new medication. She is currently taking losartan and HCTZ  and reports that her follow up visits in her PCP office after initiation of HCTZ  Were completed and she believes she missed "some" doses of her HCTZ  During that time.   On 01/20/2016 she was seen for her dizziness and calf pain. She did not have any syncope, chest pain, edema or SOB.  She states that these symptoms have resolved and she is no longer experiencing these symptoms. Today, she reports taking both her HCTZ and losartan which has controlled her BP.  She does not monitor her BP at home but reports that she "occasionally" monitors her BP at work however does not have any averages to report.  She denies chest pain, palpitations, SOB, numbness, weakness, tingling, edema, headaches, visual changes, nosebleeds or PND.  She has previously used ACE inhibitors but developed a cough. BP Readings from Last 3 Encounters:  02/09/16 120/72  01/20/16 118/82  01/14/16 (!) 166/94   GERD:  She is well controlled with aciphex and reports increasing her dose to BID as prescribed by her PCP when needed, however she denies using this BID today or yesterday. She denies N/V, melena, hematochezia, or narrow or small caliber stool. She also reports avoiding triggers of reflux which manages her symptoms with medication. Review of Systems  Constitutional: Negative for chills, fatigue  and fever.  HENT: Negative for congestion and rhinorrhea.   Eyes: Negative for visual disturbance.  Respiratory: Negative for cough, shortness of breath and wheezing.   Cardiovascular: Negative for chest pain, palpitations and leg swelling.  Gastrointestinal: Negative for abdominal pain, diarrhea, nausea and vomiting.  Musculoskeletal: Negative for myalgias.  Neurological: Negative for dizziness, weakness, light-headedness, numbness and headaches.  Psychiatric/Behavioral:       Denies depressed or anxious mood.   Past Medical History:  Diagnosis Date  . Allergy   . Anemia    PMH of   . Colon polyp   . Crohn's    ileitis/mild; Dr Sharlett Iles  . Duodenitis without mention of hemorrhage 2007   EGD  . Fibroid, uterine    Dr Garwin Brothers; G 0 P 0  . GERD (gastroesophageal reflux disease)   . Goiter    CTS,RUE > LUE  . Hiatal hernia 2002   EGD  . Hypertension   . Rectal polyp 01/24/2012   TUBULAR ADENOMA  . Reflux esophagitis 2007   EGD  . Stricture and stenosis of esophagus 2007   EGD  . Ulcer   . Vitamin D deficiency      Social History   Social History  . Marital status: Single    Spouse name: N/A  . Number of children: 0  . Years of education: N/A   Occupational History  . [employer]   Social History Main Topics  . Smoking status: Never Smoker  . Smokeless tobacco: Never Used  .  Alcohol use No  . Drug use: No  . Sexual activity: Not on file   Other Topics Concern  . Not on file   Social History Narrative  . No narrative on file    Past Surgical History:  Procedure Laterality Date  . COLONOSCOPY     polyps;granular ileitis 2006;2007 normal, Dr Sharlett Iles  . HEMORRHOID SURGERY    . OPERATIVE HYSTEROSCOPY  08-05-04   Dr Garwin Brothers  . OVARIAN CYST REMOVAL    . UPPER GASTROINTESTINAL ENDOSCOPY      Family History  Problem Relation Age of Onset  . Lymphoma Father   . Diabetes Mother   . Hypertension Mother   . Heart disease Mother   . Penile  cancer Paternal Grandfather   . Diabetes Brother     CAD; Dialysis  . Hypertension Brother   . Heart attack      1/2 maternal aunts X 2  . Rectal cancer Paternal Grandmother   . COPD Neg Hx   . Colon cancer Neg Hx   . Esophageal cancer Neg Hx   . Stomach cancer Neg Hx   . Stroke Neg Hx     Allergies  Allergen Reactions  . Cefprozil     hives  . Erythromycin     hives  . Esomeprazole Magnesium     REACTION: hives  . Penicillins     REACTION: rash  . Sulfonamide Derivatives     hives  . Doxycycline     nausea    Current Outpatient Prescriptions on File Prior to Visit  Medication Sig Dispense Refill  . Calcium Carbonate-Vitamin D (CALTRATE 600+D PO) Take 1 tablet by mouth 2 (two) times daily.    . Cholecalciferol (VITAMIN D PO) Take 1 tablet by mouth daily.    . fexofenadine (ALLEGRA) 180 MG tablet Take 180 mg by mouth daily as needed.     . hydrochlorothiazide (MICROZIDE) 12.5 MG capsule Take 1 capsule (12.5 mg total) by mouth daily. 30 capsule 1  . Lactase (LACTAID PO) Take 1 tablet by mouth as needed.    Marland Kitchen losartan (COZAAR) 100 MG tablet Take 1 tablet (100 mg total) by mouth daily. 90 tablet 1  . Multiple Vitamin (MULTIVITAMIN) capsule Take 1 capsule by mouth daily.    Marland Kitchen nystatin-triamcinolone ointment (MYCOLOG) Apply 1 application topically 2 (two) times daily.    . RABEprazole (ACIPHEX) 20 MG tablet Take 1 tablet (20 mg total) by mouth daily. 90 tablet 1   No current facility-administered medications on file prior to visit.     BP 120/72 (BP Location: Left Arm, Patient Position: Sitting, Cuff Size: Normal)   Pulse 62   Temp 98.7 F (37.1 C) (Oral)   Ht 5\' 3"  (1.6 m)   Wt 145 lb 8 oz (66 kg)   BMI 25.77 kg/m       Objective:   Physical Exam  Constitutional: She is oriented to person, place, and time. She appears well-developed and well-nourished.  HENT:  Right Ear: Tympanic membrane normal.  Left Ear: Tympanic membrane normal.  Nose: No rhinorrhea.    Mouth/Throat: Mucous membranes are normal.  Eyes: Pupils are equal, round, and reactive to light. No scleral icterus.  Neck: Neck supple.  Cardiovascular: Normal rate and regular rhythm.   Pulmonary/Chest: Effort normal and breath sounds normal. She has no wheezes. She has no rales.  Abdominal: Soft. Bowel sounds are normal. There is no tenderness.  Musculoskeletal: She exhibits no edema.  Lymphadenopathy:    She  has no cervical adenopathy.  Neurological: She is alert and oriented to person, place, and time. Coordination normal.  Skin: Skin is warm and dry. No rash noted.  Psychiatric: She has a normal mood and affect. Her behavior is normal. Judgment and thought content normal.       Assessment & Plan:  1. Essential hypertension Well controlled with HCTZ and losartan. Continue medication as prescribed and follow up with Dr. Quay Burow as scheduled. Advised monitoring of BP and documenting averages for Dr. Quay Burow. Provided written instructions regarding BP monitoring and averages to report.  2. Gastroesophageal reflux disease without esophagitis Continue aciphex as directed by Dr. Quay Burow and follow up as scheduled. Also advised avoidance of triggers for reflux. Advised follow up with her PCP as scheduled.  Delano Metz, FNP-C

## 2016-02-10 ENCOUNTER — Ambulatory Visit: Payer: 59 | Admitting: Nurse Practitioner

## 2016-02-25 ENCOUNTER — Other Ambulatory Visit (INDEPENDENT_AMBULATORY_CARE_PROVIDER_SITE_OTHER): Payer: 59

## 2016-02-25 ENCOUNTER — Telehealth: Payer: Self-pay | Admitting: Emergency Medicine

## 2016-02-25 DIAGNOSIS — R300 Dysuria: Secondary | ICD-10-CM

## 2016-02-25 DIAGNOSIS — R42 Dizziness and giddiness: Secondary | ICD-10-CM | POA: Diagnosis not present

## 2016-02-25 LAB — CBC WITH DIFFERENTIAL/PLATELET
BASOS PCT: 0.3 % (ref 0.0–3.0)
Basophils Absolute: 0 10*3/uL (ref 0.0–0.1)
EOS PCT: 1.1 % (ref 0.0–5.0)
Eosinophils Absolute: 0.1 10*3/uL (ref 0.0–0.7)
HCT: 36.2 % (ref 36.0–46.0)
Hemoglobin: 11.9 g/dL — ABNORMAL LOW (ref 12.0–15.0)
LYMPHS ABS: 1.6 10*3/uL (ref 0.7–4.0)
Lymphocytes Relative: 20.1 % (ref 12.0–46.0)
MCHC: 32.9 g/dL (ref 30.0–36.0)
MCV: 78.8 fl (ref 78.0–100.0)
MONO ABS: 0.8 10*3/uL (ref 0.1–1.0)
MONOS PCT: 10.1 % (ref 3.0–12.0)
NEUTROS ABS: 5.6 10*3/uL (ref 1.4–7.7)
NEUTROS PCT: 68.4 % (ref 43.0–77.0)
PLATELETS: 344 10*3/uL (ref 150.0–400.0)
RBC: 4.59 Mil/uL (ref 3.87–5.11)
RDW: 13.7 % (ref 11.5–15.5)
WBC: 8.2 10*3/uL (ref 4.0–10.5)

## 2016-02-25 LAB — COMPREHENSIVE METABOLIC PANEL
ALK PHOS: 73 U/L (ref 39–117)
ALT: 19 U/L (ref 0–35)
AST: 19 U/L (ref 0–37)
Albumin: 3.4 g/dL — ABNORMAL LOW (ref 3.5–5.2)
BUN: 13 mg/dL (ref 6–23)
CHLORIDE: 101 meq/L (ref 96–112)
CO2: 32 meq/L (ref 19–32)
Calcium: 10.1 mg/dL (ref 8.4–10.5)
Creatinine, Ser: 0.74 mg/dL (ref 0.40–1.20)
GFR: 101.86 mL/min (ref >60.00)
GLUCOSE: 69 mg/dL — AB (ref 70–99)
POTASSIUM: 3.9 meq/L (ref 3.5–5.1)
SODIUM: 139 meq/L (ref 135–145)
Total Bilirubin: 0.4 mg/dL (ref 0.2–1.2)
Total Protein: 7.2 g/dL (ref 6.0–8.3)

## 2016-02-25 LAB — URINALYSIS
Bilirubin Urine: NEGATIVE
Hgb urine dipstick: NEGATIVE
Ketones, ur: NEGATIVE
Leukocytes, UA: NEGATIVE
Nitrite: NEGATIVE
TOTAL PROTEIN, URINE-UPE24: NEGATIVE
URINE GLUCOSE: NEGATIVE
UROBILINOGEN UA: 0.2 (ref 0.0–1.0)
pH: 6 (ref 5.0–8.0)

## 2016-02-25 LAB — VITAMIN B12: VITAMIN B 12: 1270 pg/mL — AB (ref 211–911)

## 2016-02-25 NOTE — Telephone Encounter (Signed)
Pt came into office today after seeing her GYN. States GYN advised she come see her PCP due to the dizziness and slow speech. Pts vitals were taken in office 132/88. 98% 78p. Spoke with MD, pt scheduled appt for 02/26/16. Sent down for lab work today. Advised pt to call office if things got worse and to call 911 if she felt she was unable to call office.

## 2016-02-26 ENCOUNTER — Telehealth: Payer: Self-pay | Admitting: Emergency Medicine

## 2016-02-26 ENCOUNTER — Ambulatory Visit (INDEPENDENT_AMBULATORY_CARE_PROVIDER_SITE_OTHER): Payer: 59 | Admitting: Internal Medicine

## 2016-02-26 ENCOUNTER — Encounter: Payer: Self-pay | Admitting: Internal Medicine

## 2016-02-26 VITALS — BP 130/84 | HR 69 | Temp 98.0°F | Resp 16 | Wt 143.0 lb

## 2016-02-26 DIAGNOSIS — I10 Essential (primary) hypertension: Secondary | ICD-10-CM | POA: Diagnosis not present

## 2016-02-26 DIAGNOSIS — R42 Dizziness and giddiness: Secondary | ICD-10-CM | POA: Diagnosis not present

## 2016-02-26 LAB — URINE CULTURE

## 2016-02-26 MED ORDER — AMLODIPINE BESYLATE 2.5 MG PO TABS: 2.5000 mg | ORAL_TABLET | Freq: Every day | ORAL | 3 refills | Status: DC

## 2016-02-26 NOTE — Telephone Encounter (Signed)
Urine showed no evidence of infection. Blood counts were normal. Vitamin B12 level normal. Kidney function, liver tests electrolytes were all normal. Sugar was also normal yesterday.

## 2016-02-26 NOTE — Patient Instructions (Addendum)
   All other Health Maintenance issues reviewed.   All recommended immunizations and age-appropriate screenings are up-to-date or discussed.  No immunizations administered today.   Medications reviewed and updated.  Changes include stopping the hydrochlorothiazide.  Start amlodipine 2.5 mg daily  Your prescription(s) have been submitted to your pharmacy. Please take as directed and contact our office if you believe you are having problem(s) with the medication(s).

## 2016-02-26 NOTE — Telephone Encounter (Signed)
Please advise on pts labs, Pt came in while we were at lunch to find out results.

## 2016-02-26 NOTE — Progress Notes (Signed)
Subjective:    Patient ID: Karen Evans, female    DOB: 1950/06/21, 65 y.o.   MRN: [id number]  HPI She is here for an acute visit.   Yesterday she saw her gyn and she told her that her voice was slow and she wanted her to be seen yesterday.  She has also been experiencing intermittent lightheadedness/dizziness.  She came here yesteterday and was evaluated by a nurse.  Her BP was 130/80's.  She was neurologically normal and scheduled for today.  She has had lightheadedness daily since starting the hctz.  She also feels it is causing reflux. She denies having either any symptoms prior to starting hydrochlorothiazide. She denies any changes in her speech, confusion, slurred speech, headaches, numbness/tingling/weakness or changes in vision.   Hypertension: She is taking her medication daily, but did not take her hydrochlorothiazide today. She is compliant with a low sodium diet.  She denies chest pain, palpitations, edema, shortness of breath and regular headaches. She is exercising regularly.  She does monitor her blood pressure at work - does not recall numbers, but was told it was good.     Medications and allergies reviewed with patient and updated if appropriate.  Patient Active Problem List   Diagnosis Date Noted  . Gastroesophageal reflux disease without esophagitis 10/28/2015  . Prediabetes 10/27/2015  . Hx of adenomatous colonic polyps 10/12/2013  . Constipation 10/12/2013  . Dyspnea 07/23/2013  . Other and unspecified hyperlipidemia 03/29/2011  . GERD (gastroesophageal reflux disease) 02/18/2011  . 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 VITAMIN D DEFICIENCY 03/26/2010  . INVOLUNTARY MOVEMENTS, ABNORMAL 11/27/2009  . Hyperglycemia 12/31/2008  . IRRITABLE BOWEL SYNDROME 04/25/2008  . REFLUX ESOPHAGITIS 04/24/2008  . HIATAL HERNIA 04/24/2008  . RHINITIS 03/14/2008  . History of uterine fibroid 12/29/2007  . Goiter 12/29/2007  . Carpal tunnel syndrome 12/29/2007    . Essential hypertension 12/29/2007  . ANEMIA-NOS 07/03/2006    Current Outpatient Prescriptions on File Prior to Visit  Medication Sig Dispense Refill  . Calcium Carbonate-Vitamin D (CALTRATE 600+D PO) Take 1 tablet by mouth 2 (two) times daily.    . Cholecalciferol (VITAMIN D PO) Take 1 tablet by mouth daily.    . fexofenadine (ALLEGRA) 180 MG tablet Take 180 mg by mouth daily as needed.     . hydrochlorothiazide (MICROZIDE) 12.5 MG capsule Take 1 capsule (12.5 mg total) by mouth daily. 30 capsule 1  . Lactase (LACTAID PO) Take 1 tablet by mouth as needed.    Marland Kitchen losartan (COZAAR) 100 MG tablet Take 1 tablet (100 mg total) by mouth daily. 90 tablet 1  . Multiple Vitamin (MULTIVITAMIN) capsule Take 1 capsule by mouth daily.    Marland Kitchen nystatin-triamcinolone ointment (MYCOLOG) Apply 1 application topically 2 (two) times daily.    . RABEprazole (ACIPHEX) 20 MG tablet Take 1 tablet (20 mg total) by mouth daily. 90 tablet 1   No current facility-administered medications on file prior to visit.     Past Medical History:  Diagnosis Date  . Allergy   . Anemia    PMH of   . Colon polyp   . Crohn's    ileitis/mild; Dr Sharlett Iles  . Duodenitis without mention of hemorrhage 2007   EGD  . Fibroid, uterine    Dr Garwin Brothers; G 0 P 0  . GERD (gastroesophageal reflux disease)   . Goiter    CTS,RUE > LUE  . Hiatal hernia 2002   EGD  . Hypertension   . Rectal  polyp 01/24/2012   TUBULAR ADENOMA  . Reflux esophagitis 2007   EGD  . Stricture and stenosis of esophagus 2007   EGD  . Ulcer ([place])   . Vitamin D deficiency     Past Surgical History:  Procedure Laterality Date  . COLONOSCOPY     polyps;granular ileitis 2006;2007 normal, Dr Sharlett Iles  . HEMORRHOID SURGERY    . OPERATIVE HYSTEROSCOPY  08-05-04   Dr Garwin Brothers  . OVARIAN CYST REMOVAL    . UPPER GASTROINTESTINAL ENDOSCOPY      Social History   Social History  . Marital status: Single    Spouse name: N/A  . Number of children: 0  .  Years of education: N/A   Occupational History  . [employer]   Social History Main Topics  . Smoking status: Never Smoker  . Smokeless tobacco: Never Used  . Alcohol use No  . Drug use: No  . Sexual activity: Not on file   Other Topics Concern  . Not on file   Social History Narrative  . No narrative on file    Family History  Problem Relation Age of Onset  . Lymphoma Father   . Diabetes Mother   . Hypertension Mother   . Heart disease Mother   . Penile cancer Paternal Grandfather   . Diabetes Brother     CAD; Dialysis  . Hypertension Brother   . Heart attack      1/2 maternal aunts X 2  . Rectal cancer Paternal Grandmother   . COPD Neg Hx   . Colon cancer Neg Hx   . Esophageal cancer Neg Hx   . Stomach cancer Neg Hx   . Stroke Neg Hx     Review of Systems  Constitutional: Negative for fever.  HENT: Negative for trouble swallowing.   Respiratory: Positive for cough (occ, dry). Negative for shortness of breath and wheezing.   Cardiovascular: Negative for chest pain, palpitations and leg swelling.  Neurological: Positive for dizziness (occasional), light-headedness (from hctz) and numbness (in calves - chronic). Negative for speech difficulty, weakness and headaches.  Psychiatric/Behavioral: Negative for confusion.       Objective:   Vitals:   02/26/16 1115  BP: 130/84  Pulse: 69  Resp: 16  Temp: 98 F (36.7 C)   Filed Weights   02/26/16 1115  Weight: 143 lb (64.9 kg)   Body mass index is 25.33 kg/m.   Physical Exam Constitutional: Appears well-developed and well-nourished. No distress.  HENT:  Head: Normocephalic and atraumatic.  Neck: Neck supple. No tracheal deviation present. No thyromegaly present.  No cervical lymphadenopathy Cardiovascular: Normal rate, regular rhythm and normal heart sounds.   No murmur heard. No carotid bruit .  No edema Pulmonary/Chest: Effort normal and breath sounds normal. No respiratory distress. No  has no wheezes. No rales.  Neuro:  Gait normal, CN II-XII intact, normal sensation and strength in all extremitites Skin: Skin is warm and dry. Not diaphoretic.  Psychiatric: Normal mood and affect. Behavior is normal.         Assessment & Plan:   See Problem List for Assessment and Plan of chronic medical problems.

## 2016-02-26 NOTE — Assessment & Plan Note (Signed)
Side effects from hydrochlorothiazide She did not take the hydrochlorothiazide today and denies any lightheadedness No other concerning neurological symptoms either history or exam Basic blood work yesterday and within normal limits Will discontinue hydrochlorothiazide and monitor

## 2016-02-26 NOTE — Progress Notes (Signed)
Pre visit review using our clinic review tool, if applicable. No additional management support is needed unless otherwise documented below in the visit note. 

## 2016-02-26 NOTE — Assessment & Plan Note (Signed)
Good here today, but has not been controlled with losartan alone Discontinue hydrochlorothiazide since she is not tolerating Start amlodipine 2.5 mg daily Continue losartan 100 mg daily Advised her to continue monitoring her blood pressure at work. Discussed that we may need to increase the amlodipine. CMP yesterday normal Keep follow-up in 2 months, return sooner if needed

## 2016-02-26 NOTE — Telephone Encounter (Signed)
LVM informing pt

## 2016-03-01 ENCOUNTER — Telehealth: Payer: Self-pay | Admitting: Emergency Medicine

## 2016-03-01 NOTE — Telephone Encounter (Signed)
Pt called and stated she needs to talk to you about the medication she has been taking. She asked that you cal her back when you get a chance. Thanks.

## 2016-03-01 NOTE — Telephone Encounter (Signed)
Spoke with pt to clarify.  

## 2016-03-01 NOTE — Telephone Encounter (Signed)
Spoke with pt to clarify medications.

## 2016-03-01 NOTE — Telephone Encounter (Signed)
Patient called several times on Saturday. Please call her today

## 2016-03-05 ENCOUNTER — Telehealth: Payer: Self-pay | Admitting: Internal Medicine

## 2016-03-05 DIAGNOSIS — I10 Essential (primary) hypertension: Secondary | ICD-10-CM

## 2016-03-05 MED ORDER — LOSARTAN POTASSIUM 100 MG PO TABS: 100.0000 mg | ORAL_TABLET | Freq: Every day | ORAL | 1 refills | Status: DC

## 2016-03-05 NOTE — Telephone Encounter (Signed)
Is requesting losartan to be sent to [facility] on [address].

## 2016-03-05 NOTE — Telephone Encounter (Signed)
RX sent to pharm 

## 2016-03-11 ENCOUNTER — Telehealth: Payer: Self-pay | Admitting: Internal Medicine

## 2016-03-11 LAB — HM MAMMOGRAPHY

## 2016-03-11 NOTE — Telephone Encounter (Signed)
Spoke with pt, stated we need to call [facility] to inform that she had been taken off the HCTZ. RX has been cancelled.

## 2016-03-11 NOTE — Telephone Encounter (Signed)
LVM for pt to call back and discuss.  

## 2016-03-11 NOTE — Telephone Encounter (Signed)
Pt request to speak to the assistant Lovena Le) concern about medications. Please call her back

## 2016-03-17 ENCOUNTER — Telehealth: Payer: Self-pay | Admitting: *Deleted

## 2016-03-17 MED ORDER — ATENOLOL 25 MG PO TABS: 25.0000 mg | ORAL_TABLET | Freq: Every day | ORAL | 3 refills | Status: DC

## 2016-03-17 NOTE — Telephone Encounter (Signed)
Called pt no answer left detail msg on vm w/MD response.../lmb 

## 2016-03-17 NOTE — Telephone Encounter (Signed)
Pt left msg on triage stating the amlodipine that MD rx is giving her bad acid reflux. She is wanting MD to rx her something else...Karen Evans

## 2016-03-17 NOTE — Telephone Encounter (Signed)
We can try atenolol.  Stop amlodipine.  Atenolol sent to pharmacy.

## 2016-03-18 ENCOUNTER — Encounter: Payer: Self-pay | Admitting: Internal Medicine

## 2016-03-25 ENCOUNTER — Encounter: Payer: Self-pay | Admitting: Internal Medicine

## 2016-04-27 ENCOUNTER — Encounter: Payer: Self-pay | Admitting: Internal Medicine

## 2016-04-27 ENCOUNTER — Ambulatory Visit (INDEPENDENT_AMBULATORY_CARE_PROVIDER_SITE_OTHER): Payer: 59 | Admitting: Internal Medicine

## 2016-04-27 VITALS — BP 144/72 | HR 62 | Temp 98.2°F | Resp 16 | Wt 146.0 lb

## 2016-04-27 DIAGNOSIS — K219 Gastro-esophageal reflux disease without esophagitis: Secondary | ICD-10-CM | POA: Diagnosis not present

## 2016-04-27 DIAGNOSIS — R7303 Prediabetes: Secondary | ICD-10-CM | POA: Diagnosis not present

## 2016-04-27 DIAGNOSIS — I10 Essential (primary) hypertension: Secondary | ICD-10-CM

## 2016-04-27 DIAGNOSIS — K625 Hemorrhage of anus and rectum: Secondary | ICD-10-CM | POA: Diagnosis not present

## 2016-04-27 NOTE — Patient Instructions (Addendum)
  All other Health Maintenance issues reviewed.   All recommended immunizations and age-appropriate screenings are up-to-date or discussed.  No immunizations administered today.   Medications reviewed and updated.  No changes recommended at this time.    Please followup in 6 months for a phsyical

## 2016-04-27 NOTE — Assessment & Plan Note (Signed)
BP is slightly elevated here today Well controlled at work Continue current medications at current doses

## 2016-04-27 NOTE — Assessment & Plan Note (Addendum)
Recent sugar normal Active, not exercising - encouraged regular exercise Low sugar/carb diet F/u in 6 months

## 2016-04-27 NOTE — Progress Notes (Signed)
Subjective:    Patient ID: Karen Evans, female    DOB: 04-May-1951, 65 y.o.   MRN: [id number]  HPI The patient is here for follow up.  Hypertension: She is taking her medication daily. She had some twitching in one of her eyes and that was related to one of the medications. An eye exam was normal. She is compliant with a low sodium diet.  She denies chest pain, palpitations, edema, shortness of breath and regular headaches. She is active, but not exercising regularly.  She does monitor her blood pressure at work - 118-130/62-78.Marland Kitchen    GERD:  She is taking her medication daily as prescribed.  She denies any GERD symptoms and feels her GERD is better controlled.   Prediabetes:  She is compliant with a low sugar/carbohydrate diet.  She is active, but not exercising regularly.  Rectal bleeding: She states she has had intermittent bleeding, that is typically on the toilet paper. This usually occurs after constipation. She is due for her repeat colonoscopy in September of next year. Her grandmother had colon cancer. She feels her bowel movements are now normal. She has not seen any blood recently. She denies abdominal pain.   Medications and allergies reviewed with patient and updated if appropriate.  Patient Active Problem List   Diagnosis Date Noted  . Lightheadedness 02/26/2016  . Gastroesophageal reflux disease without esophagitis 10/28/2015  . Prediabetes 10/27/2015  . Hx of adenomatous colonic polyps 10/12/2013  . Constipation 10/12/2013  . Dyspnea 07/23/2013  . Other and unspecified hyperlipidemia 03/29/2011  . GERD (gastroesophageal reflux disease) 02/18/2011  . 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 VITAMIN D DEFICIENCY 03/26/2010  . IRRITABLE BOWEL SYNDROME 04/25/2008  . REFLUX ESOPHAGITIS 04/24/2008  . HIATAL HERNIA 04/24/2008  . RHINITIS 03/14/2008  . History of uterine fibroid 12/29/2007  . Goiter 12/29/2007  . Carpal tunnel syndrome 12/29/2007  . Essential  hypertension 12/29/2007  . ANEMIA-NOS 07/03/2006    Current Outpatient Prescriptions on File Prior to Visit  Medication Sig Dispense Refill  . atenolol (TENORMIN) 25 MG tablet Take 1 tablet (25 mg total) by mouth daily. 30 tablet 3  . Calcium Carbonate-Vitamin D (CALTRATE 600+D PO) Take 1 tablet by mouth 2 (two) times daily.    . Cholecalciferol (VITAMIN D PO) Take 1 tablet by mouth daily.    . fexofenadine (ALLEGRA) 180 MG tablet Take 180 mg by mouth daily as needed.     . Lactase (LACTAID PO) Take 1 tablet by mouth as needed.    Marland Kitchen losartan (COZAAR) 100 MG tablet Take 1 tablet (100 mg total) by mouth daily. 90 tablet 1  . Multiple Vitamin (MULTIVITAMIN) capsule Take 1 capsule by mouth daily.    Marland Kitchen nystatin-triamcinolone ointment (MYCOLOG) Apply 1 application topically 2 (two) times daily.    . RABEprazole (ACIPHEX) 20 MG tablet Take 1 tablet (20 mg total) by mouth daily. 90 tablet 1   No current facility-administered medications on file prior to visit.     Past Medical History:  Diagnosis Date  . Allergy   . Anemia    PMH of   . Colon polyp   . Crohn's    ileitis/mild; Dr Sharlett Iles  . Duodenitis without mention of hemorrhage 2007   EGD  . Fibroid, uterine    Dr Garwin Brothers; G 0 P 0  . GERD (gastroesophageal reflux disease)   . Goiter    CTS,RUE > LUE  . Hiatal hernia 2002   EGD  . Hypertension   .  Rectal polyp 01/24/2012   TUBULAR ADENOMA  . Reflux esophagitis 2007   EGD  . Stricture and stenosis of esophagus 2007   EGD  . Ulcer ([place])   . Vitamin D deficiency     Past Surgical History:  Procedure Laterality Date  . COLONOSCOPY     polyps;granular ileitis 2006;2007 normal, Dr Sharlett Iles  . HEMORRHOID SURGERY    . OPERATIVE HYSTEROSCOPY  08-05-04   Dr Garwin Brothers  . OVARIAN CYST REMOVAL    . UPPER GASTROINTESTINAL ENDOSCOPY      Social History   Social History  . Marital status: Single    Spouse name: N/A  . Number of children: 0  . Years of education: N/A    Occupational History  . [employer]   Social History Main Topics  . Smoking status: Never Smoker  . Smokeless tobacco: Never Used  . Alcohol use No  . Drug use: No  . Sexual activity: Not on file   Other Topics Concern  . Not on file   Social History Narrative  . No narrative on file    Family History  Problem Relation Age of Onset  . Lymphoma Father   . Diabetes Mother   . Hypertension Mother   . Heart disease Mother   . Penile cancer Paternal Grandfather   . Diabetes Brother     CAD; Dialysis  . Hypertension Brother   . Heart attack      1/2 maternal aunts X 2  . Rectal cancer Paternal Grandmother   . COPD Neg Hx   . Colon cancer Neg Hx   . Esophageal cancer Neg Hx   . Stomach cancer Neg Hx   . Stroke Neg Hx     Review of Systems  Constitutional: Negative for fever.  Respiratory: Negative for cough, shortness of breath and wheezing.   Cardiovascular: Negative for chest pain, palpitations and leg swelling.  Gastrointestinal: Positive for anal bleeding (blood in TP, with constipation). Negative for abdominal pain, blood in stool and diarrhea.  Neurological: Positive for headaches (occ). Negative for light-headedness.       Objective:   Vitals:   04/27/16 1049 04/27/16 1059  BP: (!) 164/84 (!) 144/72  Pulse: 62   Resp: 16   Temp: 98.2 F (36.8 C)    Filed Weights   04/27/16 1049  Weight: 146 lb (66.2 kg)   Body mass index is 25.86 kg/m.   Physical Exam    Constitutional: Appears well-developed and well-nourished. No distress.  HENT:  Head: Normocephalic and atraumatic.  Neck: Neck supple. No tracheal deviation present. No thyromegaly present.  No cervical lymphadenopathy Cardiovascular: Normal rate, regular rhythm and normal heart sounds.   No murmur heard. No carotid bruit .  No edema Pulmonary/Chest: Effort normal and breath sounds normal. No respiratory distress. No has no wheezes. No rales.  Skin: Skin is warm and dry. Not  diaphoretic.  Psychiatric: Normal mood and affect. Behavior is normal.      Assessment & Plan:    See Problem List for Assessment and Plan of chronic medical problems.    F/u in 6 months for a physical

## 2016-04-27 NOTE — Assessment & Plan Note (Signed)
GERD controlled Continue daily medication  

## 2016-04-27 NOTE — Assessment & Plan Note (Signed)
Occurs with constipation and is on the toilet paper-likely hemorrhoidal in nature She is almost due for her colonoscopy and given that her grandmother had colon cancer I will refer to GI now for their evaluation

## 2016-04-27 NOTE — Progress Notes (Signed)
Pre visit review using our clinic review tool, if applicable. No additional management support is needed unless otherwise documented below in the visit note. 

## 2016-06-11 ENCOUNTER — Other Ambulatory Visit: Payer: Self-pay | Admitting: Gastroenterology

## 2016-06-18 ENCOUNTER — Ambulatory Visit (INDEPENDENT_AMBULATORY_CARE_PROVIDER_SITE_OTHER): Payer: 59 | Admitting: Gastroenterology

## 2016-06-18 ENCOUNTER — Encounter: Payer: Self-pay | Admitting: Gastroenterology

## 2016-06-18 VITALS — BP 118/70 | HR 74 | Ht 63.0 in | Wt 147.0 lb

## 2016-06-18 DIAGNOSIS — Z86010 Personal history of colonic polyps: Secondary | ICD-10-CM | POA: Diagnosis not present

## 2016-06-18 DIAGNOSIS — K625 Hemorrhage of anus and rectum: Secondary | ICD-10-CM | POA: Diagnosis not present

## 2016-06-18 DIAGNOSIS — R194 Change in bowel habit: Secondary | ICD-10-CM | POA: Diagnosis not present

## 2016-06-18 MED ORDER — NA SULFATE-K SULFATE-MG SULF 17.5-3.13-1.6 GM/177ML PO SOLN: 1.0000 | Freq: Once | ORAL | 0 refills | Status: AC

## 2016-06-18 MED ORDER — HYDROCORTISONE ACETATE 25 MG RE SUPP: 25.0000 mg | Freq: Two times a day (BID) | RECTAL | 0 refills | Status: DC

## 2016-06-18 NOTE — Patient Instructions (Signed)
We have sent the following medications to your pharmacy for you to pick up at your convenience:  Hydrocortisone suppositories  You have been scheduled for a colonoscopy. Please follow written instructions given to you at your visit today.  Please pick up your prep supplies at the pharmacy within the next 1-3 days. If you use inhalers (even only as needed), please bring them with you on the day of your procedure. Your physician has requested that you go to www.startemmi.com and enter the access code given to you at your visit today. This web site gives a general overview about your procedure. However, you should still follow specific instructions given to you by our office regarding your preparation for the procedure.   Discontinue ALL dairy products and eat a lactose free diet

## 2016-06-18 NOTE — Progress Notes (Addendum)
06/18/2016 Karen Evans [id number] [birth year]   HISTORY OF PRESENT ILLNESS:  This is a 66 year old female who is known to Dr. Hilarie Fredrickson.  Has IBS, usually with constipation. Also suspected to have lactose intolerance. Drinks lactaid milk, etc.  Recently has been experiencing diarrhea that she describes as being mostly in the mornings and is explosive at times.  Diarrhea started around the time she began eating yogurt daily.  She also reports very small amounts of bright red blood with BM's, seen on the toilet paper.  Her last colonoscopy was in 01/2012 by Dr. Sharlett Iles at which time she was found to have some polyps removed, one was a tubular adenoma so repeat was recommended in 5 years from that time.  Just of note, she is a very poor historian and very difficult to follow in conversation.  Seems to have a very poor understanding of her medical diagnoses.   Past Medical History:  Diagnosis Date  . Allergy   . Anemia    PMH of   . Colon polyp   . Crohn's    ileitis/mild; Dr Sharlett Iles  . Duodenitis without mention of hemorrhage 2007   EGD  . Fibroid, uterine    Dr Garwin Brothers; G 0 P 0  . GERD (gastroesophageal reflux disease)   . Goiter    CTS,RUE > LUE  . Hiatal hernia 2002   EGD  . Hypertension   . Rectal polyp 01/24/2012   TUBULAR ADENOMA  . Reflux esophagitis 2007   EGD  . Stricture and stenosis of esophagus 2007   EGD  . Ulcer ([place])   . Vitamin D deficiency    Past Surgical History:  Procedure Laterality Date  . COLONOSCOPY     polyps;granular ileitis 2006;2007 normal, Dr Sharlett Iles  . HEMORRHOID SURGERY    . OPERATIVE HYSTEROSCOPY  08-05-04   Dr Garwin Brothers  . OVARIAN CYST REMOVAL    . UPPER GASTROINTESTINAL ENDOSCOPY      reports that she has never smoked. She has never used smokeless tobacco. She reports that she does not drink alcohol or use drugs. family history includes Diabetes in her brother and mother; Heart disease in her mother; Hypertension in her brother and  mother; Lymphoma in her father; Penile cancer in her paternal grandfather; Rectal cancer in her paternal grandmother. Allergies  Allergen Reactions  . Cefprozil     hives  . Erythromycin     hives  . Esomeprazole Magnesium     REACTION: hives  . Penicillins     REACTION: rash  . Sulfonamide Derivatives     hives  . Amlodipine     gerd  . Hydrochlorothiazide     lightheadedness  . Doxycycline     nausea      Outpatient Encounter Prescriptions as of 06/18/2016  Medication Sig  . ACIPHEX 20 MG tablet TAKE 1 TABLET DAILY  . atenolol (TENORMIN) 25 MG tablet Take 1 tablet (25 mg total) by mouth daily.  . Calcium Carbonate-Vitamin D (CALTRATE 600+D PO) Take 1 tablet by mouth 2 (two) times daily.  . Cholecalciferol (VITAMIN D PO) Take 1 tablet by mouth daily.  . fexofenadine (ALLEGRA) 180 MG tablet Take 180 mg by mouth daily as needed.   . Lactase (LACTAID PO) Take 1 tablet by mouth as needed.  Marland Kitchen losartan-hydrochlorothiazide (HYZAAR) 50-12.5 MG tablet Take 1 tablet by mouth at bedtime.  . Multiple Vitamin (MULTIVITAMIN) capsule Take 1 capsule by mouth daily.  Marland Kitchen nystatin-triamcinolone ointment (MYCOLOG)  Apply 1 application topically 2 (two) times daily.  . [DISCONTINUED] losartan (COZAAR) 100 MG tablet Take 1 tablet (100 mg total) by mouth daily.  . hydrocortisone (ANUSOL-HC) 25 MG suppository Place 1 suppository (25 mg total) rectally 2 (two) times daily.  . Na Sulfate-K Sulfate-Mg Sulf 17.5-3.13-1.6 GM/180ML SOLN Take 1 kit by mouth once.   No facility-administered encounter medications on file as of 06/18/2016.      REVIEW OF SYSTEMS  : All other systems reviewed and negative except where noted in the History of Present Illness.   PHYSICAL EXAM: BP 118/70   Pulse 74   Ht _0  (1.6 m)   Wt 147 lb (66.7 kg)   BMI 26.04 kg/m  General: Well developed female in no acute distress Head: Normocephalic and atraumatic Eyes:  Sclerae anicteric, conjunctiva pink. Ears: Normal  auditory acuity Lungs: Clear throughout to auscultation Heart: Regular rate and rhythm Abdomen: Soft, non-distended. Normal bowel sounds.  Non-tender. Rectal:  No external abnormalities noted.  No masses on DRE.   Musculoskeletal: Symmetrical with no gross deformities  Skin: No lesions on visible extremities Extremities: No edema  Neurological: Alert oriented x 4, grossly non-focal Psychological:  Alert and cooperative. Normal mood and affect  ASSESSMENT AND PLAN: -Change in bowel habits:  Has IBS but usually has constipation. Also suspected to have lactose intolerance. Diarrhea started around the time she began eating yogurt daily. This may very well be the cause of her recent change in bowel habits.  I have asked her to stop eating the yogurt and all dairy products to see if her diarrhea resolves. -Rectal bleeding:  Very small amounts of bright red blood with BM's.  Likely due to internal hemorrhoids.  Will give hydrocortisone suppositories to use for now. -Personal history of adenomatous colon polyps:  Due for colonoscopy 01/2017 for surveillance.  *Due to her symptoms and the fact that she is due for colonoscopy later this year anyway, we will schedule for colonoscopy with Dr. Hilarie Fredrickson.  The risks, benefits, and alternatives to colonoscopy were discussed with the patient and she consents to proceed.    CC:  Binnie Rail, MD   Addendum: Reviewed and agree with initial management. Jerene Bears, MD

## 2016-07-05 ENCOUNTER — Other Ambulatory Visit: Payer: Self-pay | Admitting: Internal Medicine

## 2016-07-05 DIAGNOSIS — I10 Essential (primary) hypertension: Secondary | ICD-10-CM

## 2016-07-09 ENCOUNTER — Telehealth: Payer: Self-pay | Admitting: Internal Medicine

## 2016-07-09 ENCOUNTER — Ambulatory Visit (INDEPENDENT_AMBULATORY_CARE_PROVIDER_SITE_OTHER): Payer: 59 | Admitting: Family Medicine

## 2016-07-09 ENCOUNTER — Encounter: Payer: Self-pay | Admitting: Family Medicine

## 2016-07-09 VITALS — BP 118/78 | HR 63 | Temp 98.0°F | Ht 63.0 in | Wt 148.7 lb

## 2016-07-09 DIAGNOSIS — R21 Rash and other nonspecific skin eruption: Secondary | ICD-10-CM | POA: Diagnosis not present

## 2016-07-09 NOTE — Telephone Encounter (Signed)
Pt has appt with Dr. Kim

## 2016-07-09 NOTE — Progress Notes (Signed)
HPI:  Acute visit for skin rash: -under L breast for several days -has had this before here and groin region -can itch or burn a little -given nystatin / steroid cream in the past and will help some but returns -no rash elsewhere today, malaise, fevers  ROS: See pertinent positives and negatives per HPI.  Past Medical History:  Diagnosis Date  . Allergy   . Anemia    PMH of   . Colon polyp   . Crohn's    ileitis/mild; Dr Sharlett Iles  . Duodenitis without mention of hemorrhage 2007   EGD  . Fibroid, uterine    Dr Garwin Brothers; G 0 P 0  . GERD (gastroesophageal reflux disease)   . Goiter    CTS,RUE > LUE  . Hiatal hernia 2002   EGD  . Hypertension   . Rectal polyp 01/24/2012   TUBULAR ADENOMA  . Reflux esophagitis 2007   EGD  . Stricture and stenosis of esophagus 2007   EGD  . Ulcer ([place])   . Vitamin D deficiency     Past Surgical History:  Procedure Laterality Date  . COLONOSCOPY     polyps;granular ileitis 2006;2007 normal, Dr Sharlett Iles  . HEMORRHOID SURGERY    . OPERATIVE HYSTEROSCOPY  08-05-04   Dr Garwin Brothers  . OVARIAN CYST REMOVAL    . UPPER GASTROINTESTINAL ENDOSCOPY      Family History  Problem Relation Age of Onset  . Lymphoma Father   . Diabetes Mother   . Hypertension Mother   . Heart disease Mother   . Penile cancer Paternal Grandfather   . Diabetes Brother     CAD; Dialysis  . Hypertension Brother   . Heart attack      1/2 maternal aunts X 2  . Rectal cancer Paternal Grandmother   . COPD Neg Hx   . Colon cancer Neg Hx   . Esophageal cancer Neg Hx   . Stomach cancer Neg Hx   . Stroke Neg Hx     Social History   Social History  . Marital status: Single    Spouse name: N/A  . Number of children: 0  . Years of education: N/A   Occupational History  . [employer]   Social History Main Topics  . Smoking status: Never Smoker  . Smokeless tobacco: Never Used  . Alcohol use No  . Drug use: No  . Sexual activity: Not Asked    Other Topics Concern  . None   Social History Narrative  . None     Current Outpatient Prescriptions:  .  ACIPHEX 20 MG tablet, TAKE 1 TABLET DAILY, Disp: 90 tablet, Rfl: 1 .  atenolol (TENORMIN) 25 MG tablet, Take 1 tablet (25 mg total) by mouth daily., Disp: 30 tablet, Rfl: 3 .  Calcium Carbonate-Vitamin D (CALTRATE 600+D PO), Take 1 tablet by mouth 2 (two) times daily., Disp: , Rfl:  .  Cholecalciferol (VITAMIN D PO), Take 1 tablet by mouth daily., Disp: , Rfl:  .  fexofenadine (ALLEGRA) 180 MG tablet, Take 180 mg by mouth daily as needed. , Disp: , Rfl:  .  hydrocortisone (ANUSOL-HC) 25 MG suppository, Place 1 suppository (25 mg total) rectally 2 (two) times daily., Disp: 14 suppository, Rfl: 0 .  Lactase (LACTAID PO), Take 1 tablet by mouth as needed., Disp: , Rfl:  .  losartan-hydrochlorothiazide (HYZAAR) 50-12.5 MG tablet, Take 1 tablet by mouth at bedtime., Disp: , Rfl:  .  Multiple Vitamin (MULTIVITAMIN) capsule, Take 1  capsule by mouth daily., Disp: , Rfl:  .  nystatin-triamcinolone ointment (MYCOLOG), Apply 1 application topically 2 (two) times daily., Disp: , Rfl:   EXAM:  Vitals:   07/09/16 1310  BP: 118/78  Pulse: 63  Temp: 98 F (36.7 C)    Body mass index is 26.34 kg/m.  GENERAL: vitals reviewed and listed above, alert, oriented, appears well hydrated and in no acute distress  HEENT: atraumatic, conjunttiva clear, no obvious abnormalities on inspection of external nose and ears  NECK: no obvious masses on inspection  SKIN: oval patch approx apple sized mildly hyperpig skin with fine peripheral scale L inframammary fold  MS: moves all extremities without noticeable abnormality  PSYCH: pleasant and cooperative, no obvious depression or anxiety  ASSESSMENT AND PLAN:  Discussed the following assessment and plan:  Skin rash  -likely yeast/fungal -advised topical azole for several weeks, keeping area clean and dry, healthy low sugar diet -advised  follow up with PCP if persists in 3-4 weeks or sooner if worsens or new concerns. -she declined AVS  There are no Patient Instructions on file for this visit.  Colin Benton R., DO

## 2016-07-09 NOTE — Telephone Encounter (Signed)
Patient Name: Karen Evans  DOB: 23-Apr-1951    Initial Comment Caller has red bumps under her breast.    Nurse Assessment  Nurse: Raphael Gibney, RN, Vanita Ingles Date/Time (Eastern Time): 07/09/2016 9:46:08 AM  Confirm and document reason for call. If symptomatic, describe symptoms. ---Caller states she has red bumps under left breast. Area is burning. History of yeast but it does not look like yeast.  Does the patient have any new or worsening symptoms? ---Yes  Will a triage be completed? ---Yes  Related visit to physician within the last 2 weeks? ---No  Does the PT have any chronic conditions? (i.e. diabetes, asthma, etc.) ---No  Is this a behavioral health or substance abuse call? ---No     Guidelines    Guideline Title Affirmed Question Affirmed Notes  Shingles [1] Shingles rash (matches SYMPTOMS) AND [2] onset within past 72 hours    Final Disposition User   See Physician within 24 Hours [place], RN, [place]    Comments  No appts available at [facility] when pt is available. appt scheduled for 11 am with Dr. Colin Benton on 07/09/2016  appt scheduled for 1 pm at [facility] on 07/09/2016 with Dr. Colin Benton   Referrals  REFERRED TO PCP OFFICE   Disagree/Comply: Comply

## 2016-07-09 NOTE — Progress Notes (Signed)
Pre visit review using our clinic review tool, if applicable. No additional management support is needed unless otherwise documented below in the visit note. 

## 2016-07-15 ENCOUNTER — Encounter: Payer: Self-pay | Admitting: Internal Medicine

## 2016-07-15 HISTORY — PX: COLONOSCOPY WITH PROPOFOL: SHX5780

## 2016-07-23 ENCOUNTER — Other Ambulatory Visit: Payer: Self-pay | Admitting: Internal Medicine

## 2016-07-29 ENCOUNTER — Encounter: Payer: Self-pay | Admitting: Internal Medicine

## 2016-07-29 ENCOUNTER — Ambulatory Visit (AMBULATORY_SURGERY_CENTER): Payer: 59 | Admitting: Internal Medicine

## 2016-07-29 VITALS — BP 148/75 | HR 50 | Temp 97.7°F | Resp 13 | Ht 63.0 in | Wt 147.0 lb

## 2016-07-29 DIAGNOSIS — K623 Rectal prolapse: Secondary | ICD-10-CM | POA: Diagnosis not present

## 2016-07-29 DIAGNOSIS — Z86010 Personal history of colonic polyps: Secondary | ICD-10-CM

## 2016-07-29 DIAGNOSIS — K629 Disease of anus and rectum, unspecified: Secondary | ICD-10-CM

## 2016-07-29 DIAGNOSIS — D123 Benign neoplasm of transverse colon: Secondary | ICD-10-CM | POA: Diagnosis not present

## 2016-07-29 MED ORDER — SODIUM CHLORIDE 0.9 % IV SOLN: 500.0000 mL | INTRAVENOUS | Status: DC

## 2016-07-29 NOTE — Op Note (Signed)
[facility] Patient Name: Karen Evans Procedure Date: 07/29/2016 1:28 PM MRN: [id number] Endoscopist: Jerene Bears , MD Age: 66 Referring MD:  Date of Birth: [birth year] Gender: Female Account #: [id number] Procedure:                Colonoscopy Indications:              Surveillance: Personal history of adenomatous                            polyps on last colonoscopy 5 years ago Medicines:                Monitored Anesthesia Care Procedure:                Pre-Anesthesia Assessment:                           - Prior to the procedure, a History and Physical                            was performed, and patient medications and                            allergies were reviewed. The patient's tolerance of                            previous anesthesia was also reviewed. The risks                            and benefits of the procedure and the sedation                            options and risks were discussed with the patient.                            All questions were answered, and informed consent                            was obtained. Prior Anticoagulants: The patient has                            taken no previous anticoagulant or antiplatelet                            agents. ASA Grade Assessment: II - A patient with                            mild systemic disease. After reviewing the risks                            and benefits, the patient was deemed in                            satisfactory condition to undergo the procedure.  After obtaining informed consent, the colonoscope                            was passed under direct vision. Throughout the                            procedure, the patient's blood pressure, pulse, and                            oxygen saturations were monitored continuously. The                            Colonoscope was introduced through the anus and                            advanced to the the cecum,  identified by                            appendiceal orifice and ileocecal valve. The                            colonoscopy was performed without difficulty. The                            patient tolerated the procedure well. The quality                            of the bowel preparation was good. The ileocecal                            valve, appendiceal orifice, and rectum were                            photographed. Scope In: 1:36:44 PM Scope Out: 1:50:03 PM Scope Withdrawal Time: 0 hours 11 minutes 18 seconds  Total Procedure Duration: 0 hours 13 minutes 19 seconds  Findings:                 The perianal and digital rectal examinations were                            normal.                           Two sessile polyps were found in the transverse                            colon. The polyps were 4 to 6 mm in size. These                            polyps were removed with a cold snare. Resection                            and retrieval were complete.  A localized area of nodular mucosa was found in the                            distal rectum. Multiple biopsies were obtained with                            cold forceps for histology in a targeted manner.                           Internal hemorrhoids were found during                            retroflexion. The hemorrhoids were small. Complications:            No immediate complications. Estimated Blood Loss:     Estimated blood loss was minimal. Impression:               - Two 4 to 6 mm polyps in the transverse colon,                            removed with a cold snare. Resected and retrieved.                           - Nodular mucosa in the distal rectum. Biopsied.                           - Internal hemorrhoids. Recommendation:           - Patient has a contact number available for                            emergencies. The signs and symptoms of potential                            delayed  complications were discussed with the                            patient. Return to normal activities tomorrow.                            Written discharge instructions were provided to the                            patient.                           - Resume previous diet.                           - Continue present medications.                           - Await pathology results.                           - Repeat colonoscopy is recommended for  surveillance. The colonoscopy date will be                            determined after pathology results from today's                            exam become available for review. Jerene Bears, MD 07/29/2016 1:58:38 PM This report has been signed electronically.

## 2016-07-29 NOTE — Patient Instructions (Signed)
YOU HAD AN ENDOSCOPIC PROCEDURE TODAY AT [facility]:   Refer to the procedure report that was given to you for any specific questions about what was found during the examination.  If the procedure report does not answer your questions, please call your gastroenterologist to clarify.  If you requested that your care partner not be given the details of your procedure findings, then the procedure report has been included in a sealed envelope for you to review at your convenience later.  YOU SHOULD EXPECT: Some feelings of bloating in the abdomen. Passage of more gas than usual.  Walking can help get rid of the air that was put into your GI tract during the procedure and reduce the bloating. If you had a lower endoscopy (such as a colonoscopy or flexible sigmoidoscopy) you may notice spotting of blood in your stool or on the toilet paper. If you underwent a bowel prep for your procedure, you may not have a normal bowel movement for a few days.  Please Note:  You might notice some irritation and congestion in your nose or some drainage.  This is from the oxygen used during your procedure.  There is no need for concern and it should clear up in a day or so.  SYMPTOMS TO REPORT IMMEDIATELY:   Following lower endoscopy (colonoscopy or flexible sigmoidoscopy):  Excessive amounts of blood in the stool  Significant tenderness or worsening of abdominal pains  Swelling of the abdomen that is new, acute  Fever of 100F or higher    For urgent or emergent issues, a gastroenterologist can be reached at any hour by calling [phone].   DIET:  We do recommend a small meal at first, but then you may proceed to your regular diet.  Drink plenty of fluids but you should avoid alcoholic beverages for 24 hours.  ACTIVITY:  You should plan to take it easy for the rest of today and you should NOT DRIVE or use heavy machinery until tomorrow (because of the sedation medicines used during the test).     FOLLOW UP: Our staff will call the number listed on your records the next business day following your procedure to check on you and address any questions or concerns that you may have regarding the information given to you following your procedure. If we do not reach you, we will leave a message.  However, if you are feeling well and you are not experiencing any problems, there is no need to return our call.  We will assume that you have returned to your regular daily activities without incident.  If any biopsies were taken you will be contacted by phone or by letter within the next 1-3 weeks.  Please call us at [phone] if you have not heard about the biopsies in 3 weeks.    SIGNATURES/CONFIDENTIALITY: You and/or your care partner have signed paperwork which will be entered into your electronic medical record.  These signatures attest to the fact that that the information above on your After Visit Summary has been reviewed and is understood.  Full responsibility of the confidentiality of this discharge information lies with you and/or your care-partner.  Information on polyps and hemorrhoids  given to you today  Await pathology on polyps and nodular area in rectum

## 2016-07-29 NOTE — Progress Notes (Signed)
Report to PACU, RN, vss, BBS= Clear.  

## 2016-07-29 NOTE — Progress Notes (Signed)
Called to room to assist during endoscopic procedure.  Patient ID and intended procedure confirmed with present staff. Received instructions for my participation in the procedure from the performing physician.  

## 2016-07-30 ENCOUNTER — Telehealth: Payer: Self-pay | Admitting: *Deleted

## 2016-07-30 NOTE — Telephone Encounter (Signed)
  Follow up Call-  Call back number 07/29/2016  Post procedure Call Back phone  # [phone]  Permission to leave phone message Yes  Some recent data might be hidden     Patient questions:  Do you have a fever, pain , or abdominal swelling? No. Pain Score  0 *  Have you tolerated food without any problems? Yes.    Have you been able to return to your normal activities? Yes.    Do you have any questions about your discharge instructions: Diet   No. Medications  No. Follow up visit  No.  Do you have questions or concerns about your Care? No.  Actions: * If pain score is 4 or above: No action needed, pain <4.

## 2016-08-10 ENCOUNTER — Encounter: Payer: Self-pay | Admitting: Internal Medicine

## 2016-08-16 ENCOUNTER — Telehealth: Payer: Self-pay | Admitting: Internal Medicine

## 2016-08-16 MED ORDER — HYDROCORTISONE ACETATE 25 MG RE SUPP: 25.0000 mg | Freq: Two times a day (BID) | RECTAL | 0 refills | Status: DC

## 2016-08-16 NOTE — Telephone Encounter (Signed)
Spoke with pt and reviewed results letter with pt, questions were answered. Pt states she is still having some soreness in her rectum and requests a refill on her anusol suppositories. Refill sent to pharmacy.

## 2016-08-18 ENCOUNTER — Telehealth: Payer: Self-pay | Admitting: Internal Medicine

## 2016-08-18 NOTE — Telephone Encounter (Signed)
Pt states she has been having diarrhea and thinks she probably had to much sugar over Easter. Reports she has been having diarrhea. Discussed with pt that she can take Imodium otc for the diarrhea. Pt verbalized understanding.

## 2016-10-31 NOTE — Patient Instructions (Addendum)
Monitor your BP  - it should be less than 140/90.   Test(s) ordered today. Your results will be released to [facility] (or called to you) after review, usually within 72hours after test completion. If any changes need to be made, you will be notified at that same time.  All other Health Maintenance issues reviewed.   All recommended immunizations and age-appropriate screenings are up-to-date or discussed.  No immunizations administered today.   Medications reviewed and updated. No changes recommended at this time.  Please followup in 6 months   Health Maintenance, Female Adopting a healthy lifestyle and getting preventive care can go a long way to promote health and wellness. Talk with your health care provider about what schedule of regular examinations is right for you. This is a good chance for you to check in with your provider about disease prevention and staying healthy. In between checkups, there are plenty of things you can do on your own. Experts have done a lot of research about which lifestyle changes and preventive measures are most likely to keep you healthy. Ask your health care provider for more information. Weight and diet Eat a healthy diet  Be sure to include plenty of vegetables, fruits, low-fat dairy products, and lean protein.  Do not eat a lot of foods high in solid fats, added sugars, or salt.  Get regular exercise. This is one of the most important things you can do for your health. ? Most adults should exercise for at least 150 minutes each week. The exercise should increase your heart rate and make you sweat (moderate-intensity exercise). ? Most adults should also do strengthening exercises at least twice a week. This is in addition to the moderate-intensity exercise.  Maintain a healthy weight  Body mass index (BMI) is a measurement that can be used to identify possible weight problems. It estimates body fat based on height and weight. Your health care provider  can help determine your BMI and help you achieve or maintain a healthy weight.  For females 40 years of age and older: ? A BMI below 18.5 is considered underweight. ? A BMI of 18.5 to 24.9 is normal. ? A BMI of 25 to 29.9 is considered overweight. ? A BMI of 30 and above is considered obese.  Watch levels of cholesterol and blood lipids  You should start having your blood tested for lipids and cholesterol at 64 years of age, then have this test every 5 years.  You may need to have your cholesterol levels checked more often if: ? Your lipid or cholesterol levels are high. ? You are older than 64 years of age. ? You are at high risk for heart disease.  Cancer screening Lung Cancer  Lung cancer screening is recommended for adults 37-37 years old who are at high risk for lung cancer because of a history of smoking.  A yearly low-dose CT scan of the lungs is recommended for people who: ? Currently smoke. ? Have quit within the past 15 years. ? Have at least a 30-pack-year history of smoking. A pack year is smoking an average of one pack of cigarettes a day for 1 year.  Yearly screening should continue until it has been 15 years since you quit.  Yearly screening should stop if you develop a health problem that would prevent you from having lung cancer treatment.  Breast Cancer  Practice breast self-awareness. This means understanding how your breasts normally appear and feel.  It also means doing regular  breast self-exams. Let your health care provider know about any changes, no matter how small.  If you are in your 20s or 30s, you should have a clinical breast exam (CBE) by a health care provider every 1-3 years as part of a regular health exam.  If you are 73 or older, have a CBE every year. Also consider having a breast X-ray (mammogram) every year.  If you have a family history of breast cancer, talk to your health care provider about genetic screening.  If you are at high  risk for breast cancer, talk to your health care provider about having an MRI and a mammogram every year.  Breast cancer gene (BRCA) assessment is recommended for women who have family members with BRCA-related cancers. BRCA-related cancers include: ? Breast. ? Ovarian. ? Tubal. ? Peritoneal cancers.  Results of the assessment will determine the need for genetic counseling and BRCA1 and BRCA2 testing.  Cervical Cancer Your health care provider may recommend that you be screened regularly for cancer of the pelvic organs (ovaries, uterus, and vagina). This screening involves a pelvic examination, including checking for microscopic changes to the surface of your cervix (Pap test). You may be encouraged to have this screening done every 3 years, beginning at age 53.  For women ages 48-65, health care providers may recommend pelvic exams and Pap testing every 3 years, or they may recommend the Pap and pelvic exam, combined with testing for human papilloma virus (HPV), every 5 years. Some types of HPV increase your risk of cervical cancer. Testing for HPV may also be done on women of any age with unclear Pap test results.  Other health care providers may not recommend any screening for nonpregnant women who are considered low risk for pelvic cancer and who do not have symptoms. Ask your health care provider if a screening pelvic exam is right for you.  If you have had past treatment for cervical cancer or a condition that could lead to cancer, you need Pap tests and screening for cancer for at least 20 years after your treatment. If Pap tests have been discontinued, your risk factors (such as having a new sexual partner) need to be reassessed to determine if screening should resume. Some women have medical problems that increase the chance of getting cervical cancer. In these cases, your health care provider may recommend more frequent screening and Pap tests.  Colorectal Cancer  This type of cancer  can be detected and often prevented.  Routine colorectal cancer screening usually begins at 64 years of age and continues through 64 years of age.  Your health care provider may recommend screening at an earlier age if you have risk factors for colon cancer.  Your health care provider may also recommend using home test kits to check for hidden blood in the stool.  A small camera at the end of a tube can be used to examine your colon directly (sigmoidoscopy or colonoscopy). This is done to check for the earliest forms of colorectal cancer.  Routine screening usually begins at age 75.  Direct examination of the colon should be repeated every 5-10 years through 64 years of age. However, you may need to be screened more often if early forms of precancerous polyps or small growths are found.  Skin Cancer  Check your skin from head to toe regularly.  Tell your health care provider about any new moles or changes in moles, especially if there is a change in a mole's shape  or color.  Also tell your health care provider if you have a mole that is larger than the size of a pencil eraser.  Always use sunscreen. Apply sunscreen liberally and repeatedly throughout the day.  Protect yourself by wearing long sleeves, pants, a wide-brimmed hat, and sunglasses whenever you are outside.  Heart disease, diabetes, and high blood pressure  High blood pressure causes heart disease and increases the risk of stroke. High blood pressure is more likely to develop in: ? People who have blood pressure in the high end of the normal range (130-139/85-89 mm Hg). ? People who are overweight or obese. ? People who are African American.  If you are 33-25 years of age, have your blood pressure checked every 3-5 years. If you are 44 years of age or older, have your blood pressure checked every year. You should have your blood pressure measured twice-once when you are at a hospital or clinic, and once when you are not at  a hospital or clinic. Record the average of the two measurements. To check your blood pressure when you are not at a hospital or clinic, you can use: ? An automated blood pressure machine at a pharmacy. ? A home blood pressure monitor.  If you are between 25 years and 31 years old, ask your health care provider if you should take aspirin to prevent strokes.  Have regular diabetes screenings. This involves taking a blood sample to check your fasting blood sugar level. ? If you are at a normal weight and have a low risk for diabetes, have this test once every three years after 64 years of age. ? If you are overweight and have a high risk for diabetes, consider being tested at a younger age or more often. Preventing infection Hepatitis B  If you have a higher risk for hepatitis B, you should be screened for this virus. You are considered at high risk for hepatitis B if: ? You were born in a country where hepatitis B is common. Ask your health care provider which countries are considered high risk. ? Your parents were born in a high-risk country, and you have not been immunized against hepatitis B (hepatitis B vaccine). ? You have HIV or AIDS. ? You use needles to inject street drugs. ? You live with someone who has hepatitis B. ? You have had sex with someone who has hepatitis B. ? You get hemodialysis treatment. ? You take certain medicines for conditions, including cancer, organ transplantation, and autoimmune conditions.  Hepatitis C  Blood testing is recommended for: ? Everyone born from [birth year] through 1963. ? Anyone with known risk factors for hepatitis C.  Sexually transmitted infections (STIs)  You should be screened for sexually transmitted infections (STIs) including gonorrhea and chlamydia if: ? You are sexually active and are younger than 64 years of age. ? You are older than 64 years of age and your health care provider tells you that you are at risk for this type of  infection. ? Your sexual activity has changed since you were last screened and you are at an increased risk for chlamydia or gonorrhea. Ask your health care provider if you are at risk.  If you do not have HIV, but are at risk, it may be recommended that you take a prescription medicine daily to prevent HIV infection. This is called pre-exposure prophylaxis (PrEP). You are considered at risk if: ? You are sexually active and do not regularly use condoms or know the  HIV status of your partner(s). ? You take drugs by injection. ? You are sexually active with a partner who has HIV.  Talk with your health care provider about whether you are at high risk of being infected with HIV. If you choose to begin PrEP, you should first be tested for HIV. You should then be tested every 3 months for as long as you are taking PrEP. Pregnancy  If you are premenopausal and you may become pregnant, ask your health care provider about preconception counseling.  If you may become pregnant, take 400 to 800 micrograms (mcg) of folic acid every day.  If you want to prevent pregnancy, talk to your health care provider about birth control (contraception). Osteoporosis and menopause  Osteoporosis is a disease in which the bones lose minerals and strength with aging. This can result in serious bone fractures. Your risk for osteoporosis can be identified using a bone density scan.  If you are 78 years of age or older, or if you are at risk for osteoporosis and fractures, ask your health care provider if you should be screened.  Ask your health care provider whether you should take a calcium or vitamin D supplement to lower your risk for osteoporosis.  Menopause may have certain physical symptoms and risks.  Hormone replacement therapy may reduce some of these symptoms and risks. Talk to your health care provider about whether hormone replacement therapy is right for you. Follow these instructions at home:  Schedule  regular health, dental, and eye exams.  Stay current with your immunizations.  Do not use any tobacco products including cigarettes, chewing tobacco, or electronic cigarettes.  If you are pregnant, do not drink alcohol.  If you are breastfeeding, limit how much and how often you drink alcohol.  Limit alcohol intake to no more than 1 drink per day for nonpregnant women. One drink equals 12 ounces of beer, 5 ounces of wine, or 1 ounces of hard liquor.  Do not use street drugs.  Do not share needles.  Ask your health care provider for help if you need support or information about quitting drugs.  Tell your health care provider if you often feel depressed.  Tell your health care provider if you have ever been abused or do not feel safe at home. This information is not intended to replace advice given to you by your health care provider. Make sure you discuss any questions you have with your health care provider. Document Released: 11/16/2010 Document Revised: 10/09/2015 Document Reviewed: 02/04/2015 Elsevier Interactive Patient Education  [employer].

## 2016-10-31 NOTE — Progress Notes (Addendum)
Subjective:    Patient ID: Karen Evans, female    DOB: Aug 10, 1950, 66 y.o.   MRN: [id number]  HPI She is here for a physical exam.   She eats healthy most of the time, but could do better.  She is active at work - walks a lot, but does not exercise outside of work.   Medications and allergies reviewed with patient and updated if appropriate.  Patient Active Problem List   Diagnosis Date Noted  . Change in bowel habits 06/18/2016  . Rectal bleeding 04/27/2016  . Lightheadedness 02/26/2016  . Prediabetes 10/27/2015  . History of colonic polyps 10/12/2013  . Constipation 10/12/2013  . Dyspnea 07/23/2013  . Other and unspecified hyperlipidemia 03/29/2011  . GERD (gastroesophageal reflux disease) 02/18/2011  . 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 VITAMIN D DEFICIENCY 03/26/2010  . IRRITABLE BOWEL SYNDROME 04/25/2008  . REFLUX ESOPHAGITIS 04/24/2008  . HIATAL HERNIA 04/24/2008  . RHINITIS 03/14/2008  . History of uterine fibroid 12/29/2007  . Goiter 12/29/2007  . Carpal tunnel syndrome 12/29/2007  . Essential hypertension 12/29/2007  . ANEMIA-NOS 07/03/2006    Current Outpatient Prescriptions on File Prior to Visit  Medication Sig Dispense Refill  . ACIPHEX 20 MG tablet TAKE 1 TABLET DAILY 90 tablet 1  . atenolol (TENORMIN) 25 MG tablet TAKE 1 TABLET BY MOUTH DAILY 90 tablet 2  . Calcium Carbonate-Vitamin D (CALTRATE 600+D PO) Take 1 tablet by mouth 2 (two) times daily.    . Cholecalciferol (VITAMIN D PO) Take 1 tablet by mouth daily.    . fexofenadine (ALLEGRA) 180 MG tablet Take 180 mg by mouth daily as needed.     . hydrocortisone (ANUSOL-HC) 25 MG suppository Place 1 suppository (25 mg total) rectally 2 (two) times daily. 14 suppository 0  . Lactase (LACTAID PO) Take 1 tablet by mouth as needed.    Marland Kitchen losartan-hydrochlorothiazide (HYZAAR) 50-12.5 MG tablet Take 1 tablet by mouth at bedtime.    . Multiple Vitamin (MULTIVITAMIN) capsule Take 1 capsule by  mouth daily.    Marland Kitchen nystatin-triamcinolone ointment (MYCOLOG) Apply 1 application topically 2 (two) times daily.     No current facility-administered medications on file prior to visit.     Past Medical History:  Diagnosis Date  . Allergy   . Anemia    PMH of   . Colon polyp   . Crohn's    ileitis/mild; Dr Sharlett Iles  . Duodenitis without mention of hemorrhage 2007   EGD  . Fibroid, uterine    Dr Garwin Brothers; G 0 P 0  . GERD (gastroesophageal reflux disease)   . Goiter    CTS,RUE > LUE  . Heart murmur   . Hiatal hernia 2002   EGD  . Hypertension   . Rectal polyp 01/24/2012   TUBULAR ADENOMA  . Reflux esophagitis 2007   EGD  . Stricture and stenosis of esophagus 2007   EGD  . Ulcer   . Vitamin D deficiency     Past Surgical History:  Procedure Laterality Date  . COLONOSCOPY     polyps;granular ileitis 2006;2007 normal, Dr Sharlett Iles  . HEMORRHOID SURGERY    . OPERATIVE HYSTEROSCOPY  08-05-04   Dr Garwin Brothers  . OVARIAN CYST REMOVAL    . POLYPECTOMY    . UPPER GASTROINTESTINAL ENDOSCOPY      Social History   Social History  . Marital status: Single    Spouse name: N/A  . Number of children: 0  . Years of education:  N/A   Occupational History  . TESTER [employer]   Social History Main Topics  . Smoking status: Never Smoker  . Smokeless tobacco: Never Used  . Alcohol use No  . Drug use: No  . Sexual activity: Not Asked   Other Topics Concern  . None   Social History Narrative  . None    Family History  Problem Relation Age of Onset  . Lymphoma Father   . Diabetes Mother   . Hypertension Mother   . Heart disease Mother   . Penile cancer Paternal Grandfather   . Diabetes Brother        CAD; Dialysis  . Hypertension Brother   . Heart attack Unknown        1/2 maternal aunts X 2  . Rectal cancer Paternal Grandmother   . Colon cancer Maternal Grandmother   . COPD Neg Hx   . Esophageal cancer Neg Hx   . Stomach cancer Neg Hx   . Stroke Neg Hx       Review of Systems  Constitutional: Negative for chills and fever.  Eyes: Negative for visual disturbance.  Respiratory: Negative for cough, shortness of breath and wheezing.   Cardiovascular: Positive for palpitations (occ at night when she lays down). Negative for chest pain and leg swelling.  Gastrointestinal: Negative for abdominal pain, blood in stool, constipation, diarrhea and nausea.       GERD controlled  Genitourinary: Negative for dysuria and hematuria.  Musculoskeletal: Negative for back pain.  Skin: Negative for rash.  Neurological: Negative for light-headedness and headaches.  Psychiatric/Behavioral: Negative for dysphoric mood. The patient is not nervous/anxious.        Objective:   Vitals:   11/02/16 1053  BP: (!) 144/86  Pulse: 66  Resp: 16  Temp: 97.8 F (36.6 C)   Filed Weights   11/02/16 1053  Weight: 150 lb (68 kg)   Body mass index is 26.57 kg/m.  Wt Readings from Last 3 Encounters:  11/02/16 150 lb (68 kg)  07/29/16 147 lb (66.7 kg)  07/09/16 148 lb 11.2 oz (67.4 kg)     Physical Exam Constitutional: She appears well-developed and well-nourished. No distress.  HENT:  Head: Normocephalic and atraumatic.  Right Ear: External ear normal. Normal ear canal and TM Left Ear: External ear normal.  Normal ear canal and TM Mouth/Throat: Oropharynx is clear and moist.  Eyes: Conjunctivae and EOM are normal.  Neck: Neck supple. No tracheal deviation present. No thyromegaly present.  No carotid bruit  Cardiovascular: Normal rate, regular rhythm and normal heart sounds.   No murmur heard.  No edema. Pulmonary/Chest: Effort normal and breath sounds normal. No respiratory distress. She has no wheezes. She has no rales.  Breast: deferred to Gyn Abdominal: Soft. She exhibits no distension. There is no tenderness.  Lymphadenopathy: She has no cervical adenopathy.  Skin: Skin is warm and dry. She is not diaphoretic.  Psychiatric: She has a normal mood  and affect. Her behavior is normal. Slow to answer questions.      Assessment & Plan:   Physical exam: Screening blood work  ordered Immunizations  Up to date, discussed shingrix (zostavax 2014) Colonoscopy  Up to date  Mammogram   Up to date  Gyn   Up to date  Eye exams   Up to date  EKG  Last done 2013 -- will repeat one today - had PAC's on her last EKG EKG: for preventative causes - EKG today shows sinus  brady at 58, normal EKG.  Compared to EKG from 2013 there are no PAC's Exercise  - walks a lot at work Weight  BMI ok for age, but advised weight loss Skin  No concerns Substance abuse  none  See Problem List for Assessment and Plan of chronic medical problems.  FU in 6 months

## 2016-11-02 ENCOUNTER — Ambulatory Visit (INDEPENDENT_AMBULATORY_CARE_PROVIDER_SITE_OTHER): Payer: 59 | Admitting: Internal Medicine

## 2016-11-02 ENCOUNTER — Encounter: Payer: Self-pay | Admitting: Internal Medicine

## 2016-11-02 VITALS — BP 144/86 | HR 66 | Temp 97.8°F | Resp 16 | Ht 63.0 in | Wt 150.0 lb

## 2016-11-02 DIAGNOSIS — E7800 Pure hypercholesterolemia, unspecified: Secondary | ICD-10-CM

## 2016-11-02 DIAGNOSIS — Z0000 Encounter for general adult medical examination without abnormal findings: Secondary | ICD-10-CM

## 2016-11-02 DIAGNOSIS — I10 Essential (primary) hypertension: Secondary | ICD-10-CM

## 2016-11-02 DIAGNOSIS — R7303 Prediabetes: Secondary | ICD-10-CM | POA: Diagnosis not present

## 2016-11-02 DIAGNOSIS — K219 Gastro-esophageal reflux disease without esophagitis: Secondary | ICD-10-CM | POA: Diagnosis not present

## 2016-11-02 NOTE — Addendum Note (Signed)
Addended by: Terence Lux B on: 11/02/2016 11:37 AM   Modules accepted: Orders

## 2016-11-02 NOTE — Assessment & Plan Note (Signed)
Check a1c Low sugar / carb diet Stressed regular exercise, weight loss  

## 2016-11-02 NOTE — Assessment & Plan Note (Addendum)
Not fasting ,but will check lipid panel She says it is well controlled when checked at work

## 2016-11-02 NOTE — Assessment & Plan Note (Signed)
Slightly elevated here today Advised to check BP at home and reviewed BP goal  Continue current medications

## 2016-11-02 NOTE — Assessment & Plan Note (Signed)
GERD controlled Continue daily medication  

## 2016-11-03 ENCOUNTER — Other Ambulatory Visit (INDEPENDENT_AMBULATORY_CARE_PROVIDER_SITE_OTHER): Payer: 59

## 2016-11-03 DIAGNOSIS — R7303 Prediabetes: Secondary | ICD-10-CM

## 2016-11-03 DIAGNOSIS — I10 Essential (primary) hypertension: Secondary | ICD-10-CM | POA: Diagnosis not present

## 2016-11-03 DIAGNOSIS — Z0000 Encounter for general adult medical examination without abnormal findings: Secondary | ICD-10-CM

## 2016-11-03 LAB — LIPID PANEL
CHOLESTEROL: 237 mg/dL — AB (ref 0–200)
HDL: 77.6 mg/dL (ref >39.00)
LDL CALC: 146 mg/dL — AB (ref 0–99)
NonHDL: 159.48
Total CHOL/HDL Ratio: 3
Triglycerides: 65 mg/dL (ref 0.0–149.0)
VLDL: 13 mg/dL (ref 0.0–40.0)

## 2016-11-03 LAB — CBC WITH DIFFERENTIAL/PLATELET
BASOS PCT: 0.9 % (ref 0.0–3.0)
Basophils Absolute: 0.1 10*3/uL (ref 0.0–0.1)
EOS ABS: 0.1 10*3/uL (ref 0.0–0.7)
EOS PCT: 2.3 % (ref 0.0–5.0)
HCT: 41.7 % (ref 36.0–46.0)
HEMOGLOBIN: 13.4 g/dL (ref 12.0–15.0)
Lymphocytes Relative: 34 % (ref 12.0–46.0)
Lymphs Abs: 2.2 10*3/uL (ref 0.7–4.0)
MCHC: 32.2 g/dL (ref 30.0–36.0)
MCV: 81.3 fl (ref 78.0–100.0)
MONO ABS: 0.6 10*3/uL (ref 0.1–1.0)
Monocytes Relative: 9.2 % (ref 3.0–12.0)
NEUTROS ABS: 3.4 10*3/uL (ref 1.4–7.7)
Neutrophils Relative %: 53.6 % (ref 43.0–77.0)
PLATELETS: 264 10*3/uL (ref 150.0–400.0)
RBC: 5.13 Mil/uL — ABNORMAL HIGH (ref 3.87–5.11)
RDW: 13.8 % (ref 11.5–15.5)
WBC: 6.3 10*3/uL (ref 4.0–10.5)

## 2016-11-03 LAB — COMPREHENSIVE METABOLIC PANEL
ALBUMIN: 3.9 g/dL (ref 3.5–5.2)
ALT: 12 U/L (ref 0–35)
AST: 15 U/L (ref 0–37)
Alkaline Phosphatase: 75 U/L (ref 39–117)
BUN: 12 mg/dL (ref 6–23)
CALCIUM: 9.9 mg/dL (ref 8.4–10.5)
CHLORIDE: 103 meq/L (ref 96–112)
CO2: 31 meq/L (ref 19–32)
Creatinine, Ser: 0.75 mg/dL (ref 0.40–1.20)
GFR: 100.07 mL/min (ref >60.00)
Glucose, Bld: 91 mg/dL (ref 70–99)
POTASSIUM: 4.3 meq/L (ref 3.5–5.1)
SODIUM: 139 meq/L (ref 135–145)
Total Bilirubin: 0.5 mg/dL (ref 0.2–1.2)
Total Protein: 7 g/dL (ref 6.0–8.3)

## 2016-11-03 LAB — HEMOGLOBIN A1C: HEMOGLOBIN A1C: 6 % (ref 4.6–6.5)

## 2016-11-03 LAB — TSH: TSH: 0.56 u[IU]/mL (ref 0.35–4.50)

## 2016-11-05 ENCOUNTER — Encounter (HOSPITAL_COMMUNITY): Payer: Self-pay | Admitting: *Deleted

## 2016-11-05 ENCOUNTER — Other Ambulatory Visit: Payer: Self-pay | Admitting: Internal Medicine

## 2016-11-05 ENCOUNTER — Emergency Department (HOSPITAL_COMMUNITY)
Admission: EM | Admit: 2016-11-05 | Discharge: 2016-11-05 | Disposition: A | Discharge status: Home / Self Care | Payer: 59 | Attending: Emergency Medicine | Admitting: Emergency Medicine

## 2016-11-05 DIAGNOSIS — I10 Essential (primary) hypertension: Secondary | ICD-10-CM | POA: Diagnosis not present

## 2016-11-05 DIAGNOSIS — Z880 Allergy status to penicillin: Secondary | ICD-10-CM | POA: Insufficient documentation

## 2016-11-05 DIAGNOSIS — Z79899 Other long term (current) drug therapy: Secondary | ICD-10-CM | POA: Insufficient documentation

## 2016-11-05 MED ORDER — ATORVASTATIN CALCIUM 20 MG PO TABS: 20.0000 mg | ORAL_TABLET | Freq: Every day | ORAL | 3 refills | Status: DC

## 2016-11-05 NOTE — ED Triage Notes (Addendum)
To ED for eval of HTN and blurred vision. States she didn't notice that her vision was blurred until getting to work about 4:30p and had to read material. States she didn't notice her vision this am because her vision is fine for distant and she had glasses on. States she has been noticing that her vision is becoming more and more blurred when reading the newspaper at home. When she went to work she had contacts in. No neuro deficits noted. She was seen by Velora Heckler MD yesterday and had blood work done. Told today she needs to be on cholesterol med. Pt speaks in full sentences. No HA. Laughing in triage with staff. No vomiting. Does complain of slight nausea intermittently. Ambulatory without difficulty. Pt requesting we not draw blood due to her having blood drawn at pcp yesterday- unless requested by edp. Took own Atenolol in waiting room prior to being triaged.

## 2016-11-05 NOTE — ED Provider Notes (Signed)
[facility] DEPT Provider Note   CSN: [id number] Arrival date & time: 11/05/16  1758     History   Chief Complaint Chief Complaint  Patient presents with  . Hypertension    HPI Karen Evans is a 66 y.o. female.  HPI   Karen Evans is a 66 y.o. female, with a history of HTN and GERD, presenting to the ED with complaint of high blood pressure.  Pt states she noticed her vision was blurry with close up reading today at work around 4:30pm. She states her far vision is normal. She states she had her glasses on at that time, but then put in her contacts (she wears one for close vision and one for far vision) and the blurriness improved significantly. She went to the nurse at her work and found her blood pressure to be high.  Patient took her losartan-HCTZ this morning and took her atenolol around 6 pm this evening. Patient was last seen by Dr. Gershon Crane, opto, last year and her glasses and contacts were updated at that time.   Patient was evaluated by her PCP on 6/20 for an annual physical. Labs were drawn.   Denies HA, dizziness, CP, SOB, epistaxis, eye pain, photophobia, eye drainage, or any other complaints.     Past Medical History:  Diagnosis Date  . Allergy   . Anemia    PMH of   . Colon polyp   . Crohn's    ileitis/mild; Dr Sharlett Iles  . Duodenitis without mention of hemorrhage 2007   EGD  . Fibroid, uterine    Dr Garwin Brothers; G 0 P 0  . GERD (gastroesophageal reflux disease)   . Goiter    CTS,RUE > LUE  . Heart murmur   . Hiatal hernia 2002   EGD  . Hypertension   . Rectal polyp 01/24/2012   TUBULAR ADENOMA  . Reflux esophagitis 2007   EGD  . Stricture and stenosis of esophagus 2007   EGD  . Ulcer   . Vitamin D deficiency     Patient Active Problem List   Diagnosis Date Noted  . Change in bowel habits 06/18/2016  . Rectal bleeding 04/27/2016  . Prediabetes 10/27/2015  . History of colonic polyps 10/12/2013  . Constipation 10/12/2013  . Dyspnea  07/23/2013  . Hyperlipidemia 03/29/2011  . GERD (gastroesophageal reflux disease) 02/18/2011  . 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 VITAMIN D DEFICIENCY 03/26/2010  . IRRITABLE BOWEL SYNDROME 04/25/2008  . HIATAL HERNIA 04/24/2008  . RHINITIS 03/14/2008  . History of uterine fibroid 12/29/2007  . Goiter 12/29/2007  . Carpal tunnel syndrome 12/29/2007  . Essential hypertension 12/29/2007  . ANEMIA-NOS 07/03/2006    Past Surgical History:  Procedure Laterality Date  . COLONOSCOPY     polyps;granular ileitis 2006;2007 normal, Dr Sharlett Iles  . HEMORRHOID SURGERY    . OPERATIVE HYSTEROSCOPY  08-05-04   Dr Garwin Brothers  . OVARIAN CYST REMOVAL    . POLYPECTOMY    . UPPER GASTROINTESTINAL ENDOSCOPY      OB History    No data available       Home Medications    Prior to Admission medications   Medication Sig Start Date End Date Taking? Authorizing Provider  acetaminophen (TYLENOL) 500 MG tablet Take 250-500 mg by mouth every 6 (six) hours as needed for headache (pain).   Yes [provider]  ACIPHEX 20 MG tablet TAKE 1 TABLET DAILY Patient taking differently: TAKE 1 TABLET (20 MG) BY MOUTH DAILY 06/11/16  Yes Zehr, Jessica D, PA-C  atenolol (TENORMIN) 25 MG tablet TAKE 1 TABLET BY MOUTH DAILY Patient taking differently: TAKE 1 TABLET BY MOUTH DAILY WITH SUPPER 07/23/16  Yes Burns, Claudina Lick, MD  Calcium Carbonate-Vitamin D (CALTRATE 600+D PO) Take 1 tablet by mouth daily.    Yes [provider]  cholecalciferol (VITAMIN D) 1000 units tablet Take 1,000 Units by mouth daily.   Yes [provider]  fexofenadine (ALLEGRA) 180 MG tablet Take 180 mg by mouth daily as needed for allergies.    Yes [provider]  hydrocortisone (ANUSOL-HC) 25 MG suppository Place 1 suppository (25 mg total) rectally 2 (two) times daily. Patient taking differently: Place 25 mg rectally 2 (two) times daily as needed for hemorrhoids or itching.  08/16/16  Yes Pyrtle, Lajuan Lines, MD  hydrocortisone cream 1 % Apply 1 application topically 2 (two) times daily as needed for itching (rash).   Yes [provider]  Lactase (LACTAID PO) Take 1 tablet by mouth 2 (two) times daily as needed (stomach upset/ gas).    Yes [provider]  losartan (COZAAR) 100 MG tablet Take 100 mg by mouth daily.   Yes [provider]  Multiple Vitamin (MULTIVITAMIN WITH MINERALS) TABS tablet Take 1 tablet by mouth daily.   Yes [provider]  atorvastatin (LIPITOR) 20 MG tablet Take 1 tablet (20 mg total) by mouth daily. 11/05/16   Binnie Rail, MD    Family History Family History  Problem Relation Age of Onset  . Lymphoma Father   . Diabetes Mother   . Hypertension Mother   . Heart disease Mother   . Penile cancer Paternal Grandfather   . Diabetes Brother        CAD; Dialysis  . Hypertension Brother   . Heart attack Unknown        1/2 maternal aunts X 2  . Rectal cancer Paternal Grandmother   . Colon cancer Maternal Grandmother   . COPD Neg Hx   . Esophageal cancer Neg Hx   . Stomach cancer Neg Hx   . Stroke Neg Hx     Social History Social History  Substance Use Topics  . Smoking status: Never Smoker  . Smokeless tobacco: Never Used  . Alcohol use No     Allergies   Cefprozil; Erythromycin; Esomeprazole magnesium; Penicillins; Sulfonamide derivatives; Amlodipine; Hydrochlorothiazide; and Doxycycline   Review of Systems Review of Systems  Constitutional: Negative for chills and fever.  Eyes: Negative for photophobia, pain, discharge and itching.       Up close blurry vision  Respiratory: Negative for shortness of breath.   Cardiovascular: Negative for chest pain.  Gastrointestinal: Negative for abdominal pain, nausea and vomiting.  Neurological: Negative for dizziness, syncope, weakness, light-headedness, numbness and headaches.  All other systems reviewed and are negative.    Physical Exam Updated Vital Signs BP (!)  196/79   Pulse (!) 55   Temp 98.6 F (37 C) (Oral)   Resp 16   SpO2 100%   Physical Exam  Constitutional: She appears well-developed and well-nourished. No distress.  HENT:  Head: Normocephalic and atraumatic.  Eyes: Conjunctivae and EOM are normal. Pupils are equal, round, and reactive to light.    Visual Acuity  Right Eye Distance: 20/25 Left Eye Distance: 20/20 Bilateral Distance: 20/20  Right Eye Near:   Left Eye Near:    Bilateral Near:     Neck: Neck supple.  Cardiovascular: Normal rate, regular rhythm, normal  heart sounds and intact distal pulses.   BP during my evaluation: 165/76  Pulmonary/Chest: Effort normal and breath sounds normal. No respiratory distress.  Abdominal: Soft. There is no tenderness. There is no guarding.  Musculoskeletal: She exhibits no edema.  Lymphadenopathy:    She has no cervical adenopathy.  Neurological: She is alert.  No sensory deficits. Strength 5/5 in all extremities. No gait disturbance. Coordination intact including heel to shin and finger to nose. Cranial nerves III-XII grossly intact. No facial droop.   Skin: Skin is warm and dry. Capillary refill takes less than 2 seconds. She is not diaphoretic.  Psychiatric: She has a normal mood and affect. Her behavior is normal.  Nursing note and vitals reviewed.    ED Treatments / Results  Labs (all labs ordered are listed, but only abnormal results are displayed) Labs Reviewed - No data to display  EKG  EKG Interpretation None       Radiology No results found.  Procedures Procedures (including critical care time)  Medications Ordered in ED Medications - No data to display   Initial Impression / Assessment and Plan / ED Course  I have reviewed the triage vital signs and the nursing notes.  Pertinent labs & imaging results that were available during my care of the patient were reviewed by me and considered in my medical decision making (see chart for details).  Clinical  Course as of Nov 06 2255  Fri Nov 05, 2016  2254 Patient remains symptom-free.   [SJ]    Clinical Course User Index [SJ] Joy, Shawn C, PA-C    Patient presents with complaint of hypertension and visual abnormality. Blurry vision resolved with patient changing from her glasses into her contacts. She had no complaints of blurry vision upon my evaluation. She has no signs or symptoms of hypertensive emergency. No signs of end organ damage. No neuro deficits. PCP follow up. The patient was given instructions for home care as well as return precautions. Patient voices understanding of these instructions, accepts the plan, and is comfortable with discharge.   Findings and plan of care discussed with Duffy Bruce, MD.   Vitals:   11/05/16 2115 11/05/16 2130 11/05/16 2145 11/05/16 2200  BP: (!) 181/82 (!) 165/76 (!) 186/99 (!) 189/79  Pulse:  (!) 56    Resp:  18    Temp:      TempSrc:      SpO2:  100%       Final Clinical Impressions(s) / ED Diagnoses   Final diagnoses:  Hypertension, unspecified type    New Prescriptions New Prescriptions   No medications on file     Layla Maw 11/05/16 2258    Duffy Bruce, MD 11/06/16 1309

## 2016-11-05 NOTE — Discharge Instructions (Signed)
You have been seen today for high blood pressure and a complaint of up close blurry vision. There were no signs of dangerous effects of your high blood pressure at this time. Continue to take your high blood pressure medications, as prescribed. Follow up with your primary care provider as soon as possible on this matter. Please also follow up with your eye doctor. Return to the ED should any symptoms worsen.

## 2016-11-09 ENCOUNTER — Encounter: Payer: Self-pay | Admitting: Internal Medicine

## 2016-11-09 ENCOUNTER — Ambulatory Visit (INDEPENDENT_AMBULATORY_CARE_PROVIDER_SITE_OTHER): Payer: 59 | Admitting: Internal Medicine

## 2016-11-09 VITALS — BP 146/84 | HR 61 | Temp 98.2°F | Resp 16 | Wt 150.0 lb

## 2016-11-09 DIAGNOSIS — I10 Essential (primary) hypertension: Secondary | ICD-10-CM

## 2016-11-09 MED ORDER — ATENOLOL 50 MG PO TABS: 50.0000 mg | ORAL_TABLET | ORAL | 3 refills | Status: DC

## 2016-11-09 NOTE — Patient Instructions (Addendum)
Medications reviewed and updated.  Changes include increasing your atenolol to 50 mg daily.  Continue the losartan at your current dose.   Your prescription(s) have been submitted to your pharmacy. Please take as directed and contact our office if you believe you are having problem(s) with the medication(s).   Please followup in 2 months    Hypertension Hypertension, commonly called high blood pressure, is when the force of blood pumping through the arteries is too strong. The arteries are the blood vessels that carry blood from the heart throughout the body. Hypertension forces the heart to work harder to pump blood and may cause arteries to become narrow or stiff. Having untreated or uncontrolled hypertension can cause heart attacks, strokes, kidney disease, and other problems. A blood pressure reading consists of a higher number over a lower number. Ideally, your blood pressure should be below 120/80. The first ("top") number is called the systolic pressure. It is a measure of the pressure in your arteries as your heart beats. The second ("bottom") number is called the diastolic pressure. It is a measure of the pressure in your arteries as the heart relaxes. What are the causes? The cause of this condition is not known. What increases the risk? Some risk factors for high blood pressure are under your control. Others are not. Factors you can change  Smoking.  Having type 2 diabetes mellitus, high cholesterol, or both.  Not getting enough exercise or physical activity.  Being overweight.  Having too much fat, sugar, calories, or salt (sodium) in your diet.  Drinking too much alcohol. Factors that are difficult or impossible to change  Having chronic kidney disease.  Having a family history of high blood pressure.  Age. Risk increases with age.  Race. You may be at higher risk if you are African-American.  Gender. Men are at higher risk than women before age 61. After age 74,  women are at higher risk than men.  Having obstructive sleep apnea.  Stress. What are the signs or symptoms? Extremely high blood pressure (hypertensive crisis) may cause:  Headache.  Anxiety.  Shortness of breath.  Nosebleed.  Nausea and vomiting.  Severe chest pain.  Jerky movements you cannot control (seizures).  How is this diagnosed? This condition is diagnosed by measuring your blood pressure while you are seated, with your arm resting on a surface. The cuff of the blood pressure monitor will be placed directly against the skin of your upper arm at the level of your heart. It should be measured at least twice using the same arm. Certain conditions can cause a difference in blood pressure between your right and left arms. Certain factors can cause blood pressure readings to be lower or higher than normal (elevated) for a short period of time:  When your blood pressure is higher when you are in a health care provider's office than when you are at home, this is called white coat hypertension. Most people with this condition do not need medicines.  When your blood pressure is higher at home than when you are in a health care provider's office, this is called masked hypertension. Most people with this condition may need medicines to control blood pressure.  If you have a high blood pressure reading during one visit or you have normal blood pressure with other risk factors:  You may be asked to return on a different day to have your blood pressure checked again.  You may be asked to monitor your blood pressure at  home for 1 week or longer.  If you are diagnosed with hypertension, you may have other blood or imaging tests to help your health care provider understand your overall risk for other conditions. How is this treated? This condition is treated by making healthy lifestyle changes, such as eating healthy foods, exercising more, and reducing your alcohol intake. Your health  care provider may prescribe medicine if lifestyle changes are not enough to get your blood pressure under control, and if:  Your systolic blood pressure is above 130.  Your diastolic blood pressure is above 80.  Your personal target blood pressure may vary depending on your medical conditions, your age, and other factors. Follow these instructions at home: Eating and drinking  Eat a diet that is high in fiber and potassium, and low in sodium, added sugar, and fat. An example eating plan is called the DASH (Dietary Approaches to Stop Hypertension) diet. To eat this way: ? Eat plenty of fresh fruits and vegetables. Try to fill half of your plate at each meal with fruits and vegetables. ? Eat whole grains, such as whole wheat pasta, brown rice, or whole grain bread. Fill about one quarter of your plate with whole grains. ? Eat or drink low-fat dairy products, such as skim milk or low-fat yogurt. ? Avoid fatty cuts of meat, processed or cured meats, and poultry with skin. Fill about one quarter of your plate with lean proteins, such as fish, chicken without skin, beans, eggs, and tofu. ? Avoid premade and processed foods. These tend to be higher in sodium, added sugar, and fat.  Reduce your daily sodium intake. Most people with hypertension should eat less than 1,500 mg of sodium a day.  Limit alcohol intake to no more than 1 drink a day for nonpregnant women and 2 drinks a day for men. One drink equals 12 oz of beer, 5 oz of wine, or 1 oz of hard liquor. Lifestyle  Work with your health care provider to maintain a healthy body weight or to lose weight. Ask what an ideal weight is for you.  Get at least 30 minutes of exercise that causes your heart to beat faster (aerobic exercise) most days of the week. Activities may include walking, swimming, or biking.  Include exercise to strengthen your muscles (resistance exercise), such as pilates or lifting weights, as part of your weekly exercise  routine. Try to do these types of exercises for 30 minutes at least 3 days a week.  Do not use any products that contain nicotine or tobacco, such as cigarettes and e-cigarettes. If you need help quitting, ask your health care provider.  Monitor your blood pressure at home as told by your health care provider.  Keep all follow-up visits as told by your health care provider. This is important. Medicines  Take over-the-counter and prescription medicines only as told by your health care provider. Follow directions carefully. Blood pressure medicines must be taken as prescribed.  Do not skip doses of blood pressure medicine. Doing this puts you at risk for problems and can make the medicine less effective.  Ask your health care provider about side effects or reactions to medicines that you should watch for. Contact a health care provider if:  You think you are having a reaction to a medicine you are taking.  You have headaches that keep coming back (recurring).  You feel dizzy.  You have swelling in your ankles.  You have trouble with your vision. Get help right away  if:  You develop a severe headache or confusion.  You have unusual weakness or numbness.  You feel faint.  You have severe pain in your chest or abdomen.  You vomit repeatedly.  You have trouble breathing. Summary  Hypertension is when the force of blood pumping through your arteries is too strong. If this condition is not controlled, it may put you at risk for serious complications.  Your personal target blood pressure may vary depending on your medical conditions, your age, and other factors. For most people, a normal blood pressure is less than 120/80.  Hypertension is treated with lifestyle changes, medicines, or a combination of both. Lifestyle changes include weight loss, eating a healthy, low-sodium diet, exercising more, and limiting alcohol. This information is not intended to replace advice given to you  by your health care provider. Make sure you discuss any questions you have with your health care provider. Document Released: 05/03/2005 Document Revised: 03/31/2016 Document Reviewed: 03/31/2016 Elsevier Interactive Patient Education  [employer].

## 2016-11-09 NOTE — Progress Notes (Addendum)
Subjective:    Patient ID: Karen Evans, female    DOB: [birth year], 66 y.o.   MRN: [id number]  HPI The patient is here for follow up from the hospital.  She went to the emergency room 11/05/16 for high blood pressure. She was at work and noticed that her vision was blurry when reading. Her distant vision was normal. Her blurry vision improved with putting in her contacts. She went to the nurse at work and her blood pressure was high. She did take her medication today. She was not experiencing any headache, dizziness, chest pain, shortness of breath, eye pain. Her blood pressure in the emergency room was 196/79. When she was examined her blood pressure was 165/76. Her neurological exam was nonfocal and her vision was fairly normal. She was not complaining of blurry vision when examined. There is no signs or symptoms of hypertensive emergency. She was discharged home. No changes were made to her medications.  She is very active at work.  She not always compliant with a low sodium diet.  She did consume a lot of sodium prior to her BP being that high.  She is taking her medications daily as prescribed.  She denies chest pain, palpitations, headaches and lightheadedness.    Medications and allergies reviewed with patient and updated if appropriate.  Patient Active Problem List   Diagnosis Date Noted  . Change in bowel habits 06/18/2016  . Rectal bleeding 04/27/2016  . Prediabetes 10/27/2015  . History of colonic polyps 10/12/2013  . Constipation 10/12/2013  . Dyspnea 07/23/2013  . Hyperlipidemia 03/29/2011  . GERD (gastroesophageal reflux disease) 02/18/2011  . 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 VITAMIN D DEFICIENCY 03/26/2010  . IRRITABLE BOWEL SYNDROME 04/25/2008  . HIATAL HERNIA 04/24/2008  . RHINITIS 03/14/2008  . History of uterine fibroid 12/29/2007  . Goiter 12/29/2007  . Carpal tunnel syndrome 12/29/2007  . Essential hypertension 12/29/2007  . ANEMIA-NOS  07/03/2006    Current Outpatient Prescriptions on File Prior to Visit  Medication Sig Dispense Refill  . acetaminophen (TYLENOL) 500 MG tablet Take 250-500 mg by mouth every 6 (six) hours as needed for headache (pain).    . ACIPHEX 20 MG tablet TAKE 1 TABLET DAILY (Patient taking differently: TAKE 1 TABLET (20 MG) BY MOUTH DAILY) 90 tablet 1  . atorvastatin (LIPITOR) 20 MG tablet Take 1 tablet (20 mg total) by mouth daily. 90 tablet 3  . Calcium Carbonate-Vitamin D (CALTRATE 600+D PO) Take 1 tablet by mouth daily.     . cholecalciferol (VITAMIN D) 1000 units tablet Take 1,000 Units by mouth daily.    . fexofenadine (ALLEGRA) 180 MG tablet Take 180 mg by mouth daily as needed for allergies.     . hydrocortisone (ANUSOL-HC) 25 MG suppository Place 1 suppository (25 mg total) rectally 2 (two) times daily. (Patient taking differently: Place 25 mg rectally 2 (two) times daily as needed for hemorrhoids or itching. ) 14 suppository 0  . hydrocortisone cream 1 % Apply 1 application topically 2 (two) times daily as needed for itching (rash).    . Lactase (LACTAID PO) Take 1 tablet by mouth 2 (two) times daily as needed (stomach upset/ gas).     . losartan (COZAAR) 100 MG tablet Take 100 mg by mouth daily.    . Multiple Vitamin (MULTIVITAMIN WITH MINERALS) TABS tablet Take 1 tablet by mouth daily.    . [DISCONTINUED] atenolol (TENORMIN) 25 MG tablet TAKE 1 TABLET BY MOUTH DAILY (Patient taking differently:  TAKE 1 TABLET BY MOUTH DAILY WITH SUPPER) 90 tablet 2   No current facility-administered medications on file prior to visit.     Past Medical History:  Diagnosis Date  . Allergy   . Anemia    PMH of   . Colon polyp   . Crohn's    ileitis/mild; Dr Sharlett Iles  . Duodenitis without mention of hemorrhage 2007   EGD  . Fibroid, uterine    Dr Garwin Brothers; G 0 P 0  . GERD (gastroesophageal reflux disease)   . Goiter    CTS,RUE > LUE  . Heart murmur   . Hiatal hernia 2002   EGD  . Hypertension   .  Rectal polyp 01/24/2012   TUBULAR ADENOMA  . Reflux esophagitis 2007   EGD  . Stricture and stenosis of esophagus 2007   EGD  . Ulcer   . Vitamin D deficiency     Past Surgical History:  Procedure Laterality Date  . COLONOSCOPY     polyps;granular ileitis 2006;2007 normal, Dr Sharlett Iles  . HEMORRHOID SURGERY    . OPERATIVE HYSTEROSCOPY  08-05-04   Dr Garwin Brothers  . OVARIAN CYST REMOVAL    . POLYPECTOMY    . UPPER GASTROINTESTINAL ENDOSCOPY      Social History   Social History  . Marital status: Single    Spouse name: N/A  . Number of children: 0  . Years of education: N/A   Occupational History  . TE[employer]   Social History Main Topics  . Smoking status: Never Smoker  . Smokeless tobacco: Never Used  . Alcohol use No  . Drug use: No  . Sexual activity: Not on file   Other Topics Concern  . Not on file   Social History Narrative  . No narrative on file    Family History  Problem Relation Age of Onset  . Lymphoma Father   . Diabetes Mother   . Hypertension Mother   . Heart disease Mother   . Penile cancer Paternal Grandfather   . Diabetes Brother        CAD; Dialysis  . Hypertension Brother   . Heart attack Unknown        1/2 maternal aunts X 2  . Rectal cancer Paternal Grandmother   . Colon cancer Maternal Grandmother   . COPD Neg Hx   . Esophageal cancer Neg Hx   . Stomach cancer Neg Hx   . Stroke Neg Hx     Review of Systems  Constitutional: Negative for chills and fever.  Respiratory: Negative for cough, shortness of breath and wheezing.   Cardiovascular: Negative for chest pain, palpitations and leg swelling.  Neurological: Negative for dizziness, light-headedness and headaches.       Objective:   Vitals:   11/09/16 1021  BP: (!) 146/84  Pulse: 61  Resp: 16  Temp: 98.2 F (36.8 C)   Wt Readings from Last 3 Encounters:  11/09/16 150 lb (68 kg)  11/02/16 150 lb (68 kg)  07/29/16 147 lb (66.7 kg)   Body mass index is  26.57 kg/m.   Physical Exam    Constitutional: Appears well-developed and well-nourished. No distress.  HENT:  Head: Normocephalic and atraumatic.  Neck: Neck supple. No tracheal deviation present. No thyromegaly present.  No cervical lymphadenopathy Cardiovascular: Normal rate, regular rhythm and normal heart sounds.   No murmur heard. No carotid bruit .  No edema Pulmonary/Chest: Effort normal and breath sounds normal. No respiratory distress. No has  no wheezes. No rales.  Skin: Skin is warm and dry. Not diaphoretic.  Psychiatric: Normal mood and affect. Behavior is normal.      Assessment & Plan:   She has an eye appointment for today   25 minutes were spent face-to-face with the patient, over 50% of which was spent counseling regarding her uncontrolled hypertension.  We discussed importance of compliance with medication, low sodium diet and benefits of regular exercise and weight loss.  We discussed consequences of uncontrolled hypertension including heart failure, stroke, kidney disease and blindness.     See Problem List for Assessment and Plan of chronic medical problems.

## 2016-11-09 NOTE — Assessment & Plan Note (Addendum)
Not controlled Taking atenolol in morning and losartan in evening stressed low sodium diet Continue exercise/increased activity Monitor BP at work or at home Increase atenolol to 50 mg  Continue losartan 100 mg  Discussed consequences of uncontrolled htn

## 2016-12-03 ENCOUNTER — Telehealth: Payer: Self-pay | Admitting: Internal Medicine

## 2016-12-03 MED ORDER — IRBESARTAN 150 MG PO TABS: 150.0000 mg | ORAL_TABLET | Freq: Every day | ORAL | 5 refills | Status: DC

## 2016-12-03 NOTE — Telephone Encounter (Signed)
Pt states her pharmacy no longer carries  her brand of losartan (COZAAR) 100 MG tablet and she would like something similar in   [facility] on [address]

## 2016-12-03 NOTE — Telephone Encounter (Signed)
Substitute sent - avapro

## 2016-12-06 ENCOUNTER — Other Ambulatory Visit: Payer: Self-pay | Admitting: Gastroenterology

## 2016-12-06 NOTE — Telephone Encounter (Signed)
Spoke with pt to inform RX has been sent and to continue to monitor BP.

## 2016-12-08 ENCOUNTER — Telehealth: Payer: Self-pay | Admitting: Internal Medicine

## 2016-12-08 MED ORDER — ROSUVASTATIN CALCIUM 5 MG PO TABS: 5.0000 mg | ORAL_TABLET | Freq: Every day | ORAL | 3 refills | Status: DC

## 2016-12-08 NOTE — Telephone Encounter (Signed)
Notified pt w/MD response. Pt is persisting that she is allergic to the atorvastatin. She states she would like dr. Quay Burow to rx something else. Inform pt MD is not due back in the office until 7/30, and will give her a call back then once she respond to msg...Karen Evans

## 2016-12-08 NOTE — Telephone Encounter (Signed)
Pt states atorvastatin (LIPITOR) 20 MG tablet  Makes her eyes itch, she would like something else sent in.  Please advise

## 2016-12-08 NOTE — Telephone Encounter (Signed)
This is EXTREMELY unlikely to be related (the itching of the eyes and the statin)  I would continue the medication, and consider OTC Zaditor (this is like zyrtec for the eyes) for itching and possible allergies

## 2016-12-08 NOTE — Telephone Encounter (Signed)
Stop atorvastatin.  Start crestor 5 mg daily - sent to [facility]

## 2016-12-08 NOTE — Telephone Encounter (Signed)
MD out of office pls advise on msg below.../lmb 

## 2016-12-09 NOTE — Telephone Encounter (Signed)
Pt.notified

## 2017-01-10 NOTE — Progress Notes (Signed)
Subjective:    Patient ID: Karen Evans, female    DOB: 06-01-1950, 66 y.o.   MRN: [id number]  HPI    Medications and allergies reviewed with patient and updated if appropriate.  Patient Active Problem List   Diagnosis Date Noted  . Change in bowel habits 06/18/2016  . Rectal bleeding 04/27/2016  . Prediabetes 10/27/2015  . History of colonic polyps 10/12/2013  . Constipation 10/12/2013  . Dyspnea 07/23/2013  . Hyperlipidemia 03/29/2011  . GERD (gastroesophageal reflux disease) 02/18/2011  . 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 VITAMIN D DEFICIENCY 03/26/2010  . IRRITABLE BOWEL SYNDROME 04/25/2008  . HIATAL HERNIA 04/24/2008  . RHINITIS 03/14/2008  . History of uterine fibroid 12/29/2007  . Goiter 12/29/2007  . Carpal tunnel syndrome 12/29/2007  . Essential hypertension 12/29/2007  . ANEMIA-NOS 07/03/2006    Current Outpatient Prescriptions on File Prior to Visit  Medication Sig Dispense Refill  . acetaminophen (TYLENOL) 500 MG tablet Take 250-500 mg by mouth every 6 (six) hours as needed for headache (pain).    . ACIPHEX 20 MG tablet TAKE 1 TABLET DAILY 90 tablet 0  . atenolol (TENORMIN) 50 MG tablet Take 1 tablet (50 mg total) by mouth every morning. 90 tablet 3  . Calcium Carbonate-Vitamin D (CALTRATE 600+D PO) Take 1 tablet by mouth daily.     . cholecalciferol (VITAMIN D) 1000 units tablet Take 1,000 Units by mouth daily.    . fexofenadine (ALLEGRA) 180 MG tablet Take 180 mg by mouth daily as needed for allergies.     . hydrocortisone (ANUSOL-HC) 25 MG suppository Place 1 suppository (25 mg total) rectally 2 (two) times daily. (Patient taking differently: Place 25 mg rectally 2 (two) times daily as needed for hemorrhoids or itching. ) 14 suppository 0  . hydrocortisone cream 1 % Apply 1 application topically 2 (two) times daily as needed for itching (rash).    . irbesartan (AVAPRO) 150 MG tablet Take 1 tablet (150 mg total) by mouth daily. 30 tablet 5    . Lactase (LACTAID PO) Take 1 tablet by mouth 2 (two) times daily as needed (stomach upset/ gas).     . Multiple Vitamin (MULTIVITAMIN WITH MINERALS) TABS tablet Take 1 tablet by mouth daily.    . rosuvastatin (CRESTOR) 5 MG tablet Take 1 tablet (5 mg total) by mouth daily. 90 tablet 3   No current facility-administered medications on file prior to visit.     Past Medical History:  Diagnosis Date  . Allergy   . Anemia    PMH of   . Colon polyp   . Crohn's    ileitis/mild; Dr Sharlett Iles  . Duodenitis without mention of hemorrhage 2007   EGD  . Fibroid, uterine    Dr Garwin Brothers; G 0 P 0  . GERD (gastroesophageal reflux disease)   . Goiter    CTS,RUE > LUE  . Heart murmur   . Hiatal hernia 2002   EGD  . Hypertension   . Rectal polyp 01/24/2012   TUBULAR ADENOMA  . Reflux esophagitis 2007   EGD  . Stricture and stenosis of esophagus 2007   EGD  . Ulcer   . Vitamin D deficiency     Past Surgical History:  Procedure Laterality Date  . COLONOSCOPY     polyps;granular ileitis 2006;2007 normal, Dr Sharlett Iles  . HEMORRHOID SURGERY    . OPERATIVE HYSTEROSCOPY  08-05-04   Dr Garwin Brothers  . OVARIAN CYST REMOVAL    . POLYPECTOMY    .  UPPER GASTROINTESTINAL ENDOSCOPY      Social History   Social History  . Marital status: Single    Spouse name: N/A  . Number of children: 0  . Years of education: N/A   Occupational History  . [employer]   Social History Main Topics  . Smoking status: Never Smoker  . Smokeless tobacco: Never Used  . Alcohol use No  . Drug use: No  . Sexual activity: Not on file   Other Topics Concern  . Not on file   Social History Narrative  . No narrative on file    Family History  Problem Relation Age of Onset  . Lymphoma Father   . Diabetes Mother   . Hypertension Mother   . Heart disease Mother   . Penile cancer Paternal Grandfather   . Diabetes Brother        CAD; Dialysis  . Hypertension Brother   . Heart attack Unknown         1/2 maternal aunts X 2  . Rectal cancer Paternal Grandmother   . Colon cancer Maternal Grandmother   . COPD Neg Hx   . Esophageal cancer Neg Hx   . Stomach cancer Neg Hx   . Stroke Neg Hx     Review of Systems     Objective:  There were no vitals filed for this visit. Wt Readings from Last 3 Encounters:  11/09/16 150 lb (68 kg)  11/02/16 150 lb (68 kg)  07/29/16 147 lb (66.7 kg)   There is no height or weight on file to calculate BMI.   Physical Exam         Assessment & Plan:    See Problem List for Assessment and Plan of chronic medical problems.    This encounter was created in error - please disregard.

## 2017-01-11 ENCOUNTER — Encounter: Payer: 59 | Admitting: Internal Medicine

## 2017-01-11 NOTE — Progress Notes (Signed)
Subjective:    Patient ID: Karen Evans, female    DOB: 04-Apr-1951, 66 y.o.   MRN: [id number]  HPI The patient is here for follow up.  Hypertension: We increased her atenolol 2 months ago.  She is taking her medication daily. She is compliant with a low sodium diet.  She denies chest pain, edema, shortness of breath and regular headaches. She is active, but is not exercising regularly.  She does not monitor her blood pressure at home.    Hyperlipidemia: She is taking her medication daily, but it is causing her to itch.  She has needed to take benadryl. She is compliant with a low fat/cholesterol diet. She is not exercising regularly. She denies myalgias.   Prediabetes:  She is compliant with a low sugar/carbohydrate diet.  She is active, but not exercising regularly.   Medications and allergies reviewed with patient and updated if appropriate.  Patient Active Problem List   Diagnosis Date Noted  . Change in bowel habits 06/18/2016  . Rectal bleeding 04/27/2016  . Prediabetes 10/27/2015  . History of colonic polyps 10/12/2013  . Constipation 10/12/2013  . Dyspnea 07/23/2013  . Hyperlipidemia 03/29/2011  . GERD (gastroesophageal reflux disease) 02/18/2011  . 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 VITAMIN D DEFICIENCY 03/26/2010  . IRRITABLE BOWEL SYNDROME 04/25/2008  . HIATAL HERNIA 04/24/2008  . RHINITIS 03/14/2008  . History of uterine fibroid 12/29/2007  . Goiter 12/29/2007  . Carpal tunnel syndrome 12/29/2007  . Essential hypertension 12/29/2007  . ANEMIA-NOS 07/03/2006    Current Outpatient Prescriptions on File Prior to Visit  Medication Sig Dispense Refill  . acetaminophen (TYLENOL) 500 MG tablet Take 250-500 mg by mouth every 6 (six) hours as needed for headache (pain).    . ACIPHEX 20 MG tablet TAKE 1 TABLET DAILY 90 tablet 0  . atenolol (TENORMIN) 50 MG tablet Take 1 tablet (50 mg total) by mouth every morning. 90 tablet 3  . Calcium  Carbonate-Vitamin D (CALTRATE 600+D PO) Take 1 tablet by mouth daily.     . cholecalciferol (VITAMIN D) 1000 units tablet Take 1,000 Units by mouth daily.    . fexofenadine (ALLEGRA) 180 MG tablet Take 180 mg by mouth daily as needed for allergies.     . hydrocortisone (ANUSOL-HC) 25 MG suppository Place 1 suppository (25 mg total) rectally 2 (two) times daily. (Patient taking differently: Place 25 mg rectally 2 (two) times daily as needed for hemorrhoids or itching. ) 14 suppository 0  . hydrocortisone cream 1 % Apply 1 application topically 2 (two) times daily as needed for itching (rash).    . irbesartan (AVAPRO) 150 MG tablet Take 1 tablet (150 mg total) by mouth daily. 30 tablet 5  . Lactase (LACTAID PO) Take 1 tablet by mouth 2 (two) times daily as needed (stomach upset/ gas).     . Multiple Vitamin (MULTIVITAMIN WITH MINERALS) TABS tablet Take 1 tablet by mouth daily.     No current facility-administered medications on file prior to visit.     Past Medical History:  Diagnosis Date  . Allergy   . Anemia    PMH of   . Colon polyp   . Crohn's    ileitis/mild; Dr Sharlett Iles  . Duodenitis without mention of hemorrhage 2007   EGD  . Fibroid, uterine    Dr Garwin Brothers; G 0 P 0  . GERD (gastroesophageal reflux disease)   . Goiter    CTS,RUE > LUE  . Heart murmur   .  Hiatal hernia 2002   EGD  . Hypertension   . Rectal polyp 01/24/2012   TUBULAR ADENOMA  . Reflux esophagitis 2007   EGD  . Stricture and stenosis of esophagus 2007   EGD  . Ulcer   . Vitamin D deficiency     Past Surgical History:  Procedure Laterality Date  . COLONOSCOPY     polyps;granular ileitis 2006;2007 normal, Dr Sharlett Iles  . HEMORRHOID SURGERY    . OPERATIVE HYSTEROSCOPY  08-05-04   Dr Garwin Brothers  . OVARIAN CYST REMOVAL    . POLYPECTOMY    . UPPER GASTROINTESTINAL ENDOSCOPY      Social History   Social History  . Marital status: Single    Spouse name: N/A  . Number of children: 0  . Years of  education: N/A   Occupational History  . TESTER [employer]   Social History Main Topics  . Smoking status: Never Smoker  . Smokeless tobacco: Never Used  . Alcohol use No  . Drug use: No  . Sexual activity: Not Asked   Other Topics Concern  . None   Social History Narrative  . None    Family History  Problem Relation Age of Onset  . Lymphoma Father   . Diabetes Mother   . Hypertension Mother   . Heart disease Mother   . Penile cancer Paternal Grandfather   . Diabetes Brother        CAD; Dialysis  . Hypertension Brother   . Heart attack Unknown        1/2 maternal aunts X 2  . Rectal cancer Paternal Grandmother   . Colon cancer Maternal Grandmother   . COPD Neg Hx   . Esophageal cancer Neg Hx   . Stomach cancer Neg Hx   . Stroke Neg Hx     Review of Systems  Constitutional: Negative for chills and fever.  HENT: Positive for postnasal drip and rhinorrhea.   Respiratory: Positive for cough. Negative for shortness of breath and wheezing.   Cardiovascular: Positive for palpitations (occ when she lays down). Negative for chest pain and leg swelling.  Neurological: Negative for light-headedness and headaches.       Bottom of feet burn, intermittent        Objective:   Vitals:   01/12/17 1425  BP: 124/70  Pulse: 91  Resp: 16  Temp: 98 F (36.7 C)  SpO2: 98%   Wt Readings from Last 3 Encounters:  01/12/17 152 lb (68.9 kg)  11/09/16 150 lb (68 kg)  11/02/16 150 lb (68 kg)   Body mass index is 26.93 kg/m.   Physical Exam    Constitutional: Appears well-developed and well-nourished. No distress.  HENT:  Head: Normocephalic and atraumatic.  Neck: Neck supple. No tracheal deviation present. No thyromegaly present.  No cervical lymphadenopathy Cardiovascular: Normal rate, regular rhythm and normal heart sounds.   No murmur heard. No carotid bruit .  No edema Pulmonary/Chest: Effort normal and breath sounds normal. No respiratory distress. No has no  wheezes. No rales.  Skin: Skin is warm and dry. Not diaphoretic.  Psychiatric: Normal mood and affect. Behavior is normal.      Assessment & Plan:    See Problem List for Assessment and Plan of chronic medical problems.

## 2017-01-12 ENCOUNTER — Ambulatory Visit (INDEPENDENT_AMBULATORY_CARE_PROVIDER_SITE_OTHER): Payer: 59 | Admitting: Internal Medicine

## 2017-01-12 ENCOUNTER — Encounter: Payer: Self-pay | Admitting: Internal Medicine

## 2017-01-12 VITALS — BP 124/70 | HR 91 | Temp 98.0°F | Resp 16 | Wt 152.0 lb

## 2017-01-12 DIAGNOSIS — E7800 Pure hypercholesterolemia, unspecified: Secondary | ICD-10-CM | POA: Diagnosis not present

## 2017-01-12 DIAGNOSIS — I10 Essential (primary) hypertension: Secondary | ICD-10-CM | POA: Diagnosis not present

## 2017-01-12 DIAGNOSIS — R7303 Prediabetes: Secondary | ICD-10-CM

## 2017-01-12 MED ORDER — PRAVASTATIN SODIUM 20 MG PO TABS: 20.0000 mg | ORAL_TABLET | Freq: Every day | ORAL | 3 refills | Status: DC

## 2017-01-12 NOTE — Assessment & Plan Note (Signed)
Lab Results  Component Value Date   HGBA1C 6.0 11/03/2016   Continue increased activity and low sugar/carb diet Follow up in 6 months

## 2017-01-12 NOTE — Patient Instructions (Addendum)
  No immunizations administered today.   Medications reviewed and updated.  Changes include stopping crestor and starting pravastatin for your cholesterol.    Your prescription(s) have been submitted to your pharmacy. Please take as directed and contact our office if you believe you are having problem(s) with the medication(s).    Please followup in 6 months

## 2017-01-12 NOTE — Assessment & Plan Note (Signed)
Did not tolerate crestor - caused itching, will d/c Will try pravastatin Low fat/cholesterol diet, continue increased activity

## 2017-01-12 NOTE — Assessment & Plan Note (Signed)
BP well controlled Current regimen effective and well tolerated Continue current medications at current doses  

## 2017-01-18 ENCOUNTER — Encounter (HOSPITAL_COMMUNITY): Payer: Self-pay | Admitting: Emergency Medicine

## 2017-01-18 ENCOUNTER — Ambulatory Visit (HOSPITAL_COMMUNITY)
Admission: EM | Admit: 2017-01-18 | Discharge: 2017-01-18 | Disposition: A | Discharge status: Home / Self Care | Payer: 59 | Attending: Family Medicine | Admitting: Family Medicine

## 2017-01-18 ENCOUNTER — Telehealth: Payer: Self-pay | Admitting: Internal Medicine

## 2017-01-18 DIAGNOSIS — I10 Essential (primary) hypertension: Secondary | ICD-10-CM | POA: Diagnosis not present

## 2017-01-18 DIAGNOSIS — H539 Unspecified visual disturbance: Secondary | ICD-10-CM

## 2017-01-18 NOTE — ED Triage Notes (Signed)
PT reports her cholesterol meds were changed this week. PT thinks her BP has been too high because her vision is a bit blurry. PT has not checked BP in a week. PT reports she took an Human resources officer and her eyesight is improving.

## 2017-01-18 NOTE — Telephone Encounter (Signed)
Patient states pravastatin is not working for her.  States she feels weaker and dizzy headed.  States the last medication she was on worked better - rosuvastatin.  Requesting call back in regard.

## 2017-01-19 NOTE — Telephone Encounter (Signed)
Appt made for 9/7

## 2017-01-19 NOTE — ED Provider Notes (Signed)
[facility]   [id number] 01/18/17 Arrival Time: 5284  ASSESSMENT & PLAN:  1. Essential hypertension   2. Visual changes    Recommend monitoring BP and following up with her PCP before changing doses of medications. She will schedule. May f/u here as needed. No acute medical problems identified tonight. Reviewed expectations re: course of current medical issues. Questions answered. Outlined signs and symptoms indicating need for more acute intervention. Patient verbalized understanding. After Visit Summary given.   SUBJECTIVE:  ALDONIA Evans is a 66 y.o. female who presents with complaint of noticing slightly elevated BP recently. Think she may need to increase her HTN med doses. No health changes reported. Occasionally notices her vision is a bit blurry. Has seen ophthalmologist and told she is getting cataracts. Allegra has helped with her eyesight she reports. No CP or headaches. Otherwise feeling well.  ROS: As per HPI.   OBJECTIVE:  Vitals:   01/18/17 1838 01/18/17 1839  BP:  (!) 154/68  Pulse:  (!) 53  Resp:  16  Temp:  98.1 F (36.7 C)  TempSrc:  Oral  SpO2:  98%  Weight: 152 lb (68.9 kg)     General appearance: alert; no distress Eyes: PERRLA; EOMI; conjunctiva normal Lungs: clear to auscultation bilaterally Heart: regular rate and rhythm Abdomen: soft, non-tender; bowel sounds normal; no masses or organomegaly; no guarding or rebound tenderness Extremities: no cyanosis or edema; symmetrical with no gross deformities Skin: warm and dry Psychological: alert and cooperative; normal mood and affect   Allergies  Allergen Reactions  . Cefprozil Hives  . Erythromycin Hives  . Esomeprazole Magnesium Hives  . Penicillins Hives and Rash    Has patient had a PCN reaction causing immediate rash, facial/tongue/throat swelling, SOB or lightheadedness with hypotension: Yes Has patient had a PCN reaction causing severe rash involving mucus membranes or skin  necrosis: No Has patient had a PCN reaction that required hospitalization: No Has patient had a PCN reaction occurring within the last 10 years: No If all of the above answers are "NO", then may proceed with Cephalosporin use.  . Sulfonamide Derivatives     hives  . Amlodipine Other (See Comments)    gerd  . Crestor [Rosuvastatin Calcium]     itching  . Hydrochlorothiazide Other (See Comments)    lightheadedness  . Atorvastatin Other (See Comments)    Eyes itch  . Doxycycline Nausea And Vomiting    Past Medical History:  Diagnosis Date  . Allergy   . Anemia    PMH of   . Colon polyp   . Crohn's    ileitis/mild; Dr Sharlett Iles  . Duodenitis without mention of hemorrhage 2007   EGD  . Fibroid, uterine    Dr Garwin Brothers; G 0 P 0  . GERD (gastroesophageal reflux disease)   . Goiter    CTS,RUE > LUE  . Heart murmur   . Hiatal hernia 2002   EGD  . Hypertension   . Rectal polyp 01/24/2012   TUBULAR ADENOMA  . Reflux esophagitis 2007   EGD  . Stricture and stenosis of esophagus 2007   EGD  . Ulcer   . Vitamin D deficiency    Social History   Social History  . Marital status: Single    Spouse name: N/A  . Number of children: 0  . Years of education: N/A   Occupational History  . [employer]   Social History Main Topics  . Smoking status: Never Smoker  .  Smokeless tobacco: Never Used  . Alcohol use No  . Drug use: No  . Sexual activity: Not on file   Other Topics Concern  . Not on file   Social History Narrative  . No narrative on file   Family History  Problem Relation Age of Onset  . Lymphoma Father   . Diabetes Mother   . Hypertension Mother   . Heart disease Mother   . Penile cancer Paternal Grandfather   . Diabetes Brother        CAD; Dialysis  . Hypertension Brother   . Heart attack Unknown        1/2 maternal aunts X 2  . Rectal cancer Paternal Grandmother   . Colon cancer Maternal Grandmother   . COPD Neg Hx   . Esophageal  cancer Neg Hx   . Stomach cancer Neg Hx   . Stroke Neg Hx    Past Surgical History:  Procedure Laterality Date  . COLONOSCOPY     polyps;granular ileitis 2006;2007 normal, Dr Sharlett Iles  . HEMORRHOID SURGERY    . OPERATIVE HYSTEROSCOPY  08-05-04   Dr Garwin Brothers  . OVARIAN CYST REMOVAL    . POLYPECTOMY    . UPPER GASTROINTESTINAL ENDOSCOPY       Karen Kick, MD 01/19/17 [phone]

## 2017-01-19 NOTE — Telephone Encounter (Signed)
Pls call pt back and let her know she need to make ER follow-up w/Dr. Quay Burow. At the visit they can discuss medication changes...Karen Evans

## 2017-01-19 NOTE — Telephone Encounter (Signed)
Pt called regarding this she was seen at the ER because she lost her vision.  Pleased call back can this medication be changed?

## 2017-01-21 ENCOUNTER — Ambulatory Visit (INDEPENDENT_AMBULATORY_CARE_PROVIDER_SITE_OTHER): Payer: 59 | Admitting: Internal Medicine

## 2017-01-21 ENCOUNTER — Encounter: Payer: Self-pay | Admitting: Internal Medicine

## 2017-01-21 VITALS — BP 134/92 | HR 59 | Temp 98.5°F | Resp 16 | Wt 149.0 lb

## 2017-01-21 DIAGNOSIS — H538 Other visual disturbances: Secondary | ICD-10-CM

## 2017-01-21 DIAGNOSIS — E7800 Pure hypercholesterolemia, unspecified: Secondary | ICD-10-CM

## 2017-01-21 DIAGNOSIS — I10 Essential (primary) hypertension: Secondary | ICD-10-CM | POA: Diagnosis not present

## 2017-01-21 NOTE — Assessment & Plan Note (Signed)
Only in one eye and only occurs when she does not take the allegra, but has side effects from the medication Has f/u with eye doctor Take an oral anti-histamine - can try zyrtec or use nasal spray such as flonase

## 2017-01-21 NOTE — Assessment & Plan Note (Signed)
Pravastatin caused dizziness and muscle aches - stopped it Will retry crestor - she is vague about if it caused the itching or not

## 2017-01-21 NOTE — Assessment & Plan Note (Signed)
Having BP checked work - has been "normal" Will try to start checking it at home Continue current medications

## 2017-01-21 NOTE — Patient Instructions (Addendum)
  Test(s) ordered today. Your results will be released to [facility] (or called to you) after review, usually within 72hours after test completion. If any changes need to be made, you will be notified at that same time.  All other Health Maintenance issues reviewed.   All recommended immunizations and age-appropriate screenings are up-to-date or discussed.  No immunizations administered today.   Medications reviewed and updated.  No changes recommended at this time.

## 2017-01-21 NOTE — Progress Notes (Signed)
Subjective:    Patient ID: Karen Evans, female    DOB: 02-Aug-1950, 66 y.o.   MRN: [id number]  HPI The patient is here for follow up from urgent care.  She went to urgent care 01/18/17 for vision changes.    She noticed slightly elevated BP and thought she needed an increase in BP medication.  She occasionally was noticing blurry vision.   Her ophthalmologist told her she was getting cataracts.  She was not having any headaches or chest pain.  Her BP at urgent care was 154/68 with a HR of 53.  Her exam was normal.  No changes in medication were made.  She was advised to monitor her BP and follow up with me.   She has her BP checked at work and it has been normal.  She denies chest pain, leg swelling, SOB.  She has not checked her BP at home.    Blurry vision:  Only one eye gets blurry.  It does not occur daily.  If she does not take allegra she will have the blurry vision.  As long as she uses the medication she does not have blurry vision.  She does not like the allegra because it makes her feel like she can not breath.    Hyperlipidemia:  She stopped the pravastatin due to muscle pain and dizziness.  She thinks she did ok with the crestor - although at that time she stated it caused itching.   Medications and allergies reviewed with patient and updated if appropriate.  Patient Active Problem List   Diagnosis Date Noted  . Change in bowel habits 06/18/2016  . Rectal bleeding 04/27/2016  . Prediabetes 10/27/2015  . History of colonic polyps 10/12/2013  . Constipation 10/12/2013  . Dyspnea 07/23/2013  . Hyperlipidemia 03/29/2011  . GERD (gastroesophageal reflux disease) 02/18/2011  . 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 VITAMIN D DEFICIENCY 03/26/2010  . IRRITABLE BOWEL SYNDROME 04/25/2008  . HIATAL HERNIA 04/24/2008  . RHINITIS 03/14/2008  . History of uterine fibroid 12/29/2007  . Goiter 12/29/2007  . Carpal tunnel syndrome 12/29/2007  . Essential hypertension  12/29/2007  . ANEMIA-NOS 07/03/2006    Current Outpatient Prescriptions on File Prior to Visit  Medication Sig Dispense Refill  . acetaminophen (TYLENOL) 500 MG tablet Take 250-500 mg by mouth every 6 (six) hours as needed for headache (pain).    . ACIPHEX 20 MG tablet TAKE 1 TABLET DAILY 90 tablet 0  . atenolol (TENORMIN) 50 MG tablet Take 1 tablet (50 mg total) by mouth every morning. 90 tablet 3  . Calcium Carbonate-Vitamin D (CALTRATE 600+D PO) Take 1 tablet by mouth daily.     . cholecalciferol (VITAMIN D) 1000 units tablet Take 1,000 Units by mouth daily.    . fexofenadine (ALLEGRA) 180 MG tablet Take 180 mg by mouth daily as needed for allergies.     . hydrocortisone (ANUSOL-HC) 25 MG suppository Place 1 suppository (25 mg total) rectally 2 (two) times daily. (Patient taking differently: Place 25 mg rectally 2 (two) times daily as needed for hemorrhoids or itching. ) 14 suppository 0  . hydrocortisone cream 1 % Apply 1 application topically 2 (two) times daily as needed for itching (rash).    . irbesartan (AVAPRO) 150 MG tablet Take 1 tablet (150 mg total) by mouth daily. 30 tablet 5  . Lactase (LACTAID PO) Take 1 tablet by mouth 2 (two) times daily as needed (stomach upset/ gas).     . Multiple  Vitamin (MULTIVITAMIN WITH MINERALS) TABS tablet Take 1 tablet by mouth daily.     No current facility-administered medications on file prior to visit.     Past Medical History:  Diagnosis Date  . Allergy   . Anemia    PMH of   . Colon polyp   . Crohn's    ileitis/mild; Dr Sharlett Iles  . Duodenitis without mention of hemorrhage 2007   EGD  . Fibroid, uterine    Dr Garwin Brothers; G 0 P 0  . GERD (gastroesophageal reflux disease)   . Goiter    CTS,RUE > LUE  . Heart murmur   . Hiatal hernia 2002   EGD  . Hypertension   . Rectal polyp 01/24/2012   TUBULAR ADENOMA  . Reflux esophagitis 2007   EGD  . Stricture and stenosis of esophagus 2007   EGD  . Ulcer   . Vitamin D deficiency      Past Surgical History:  Procedure Laterality Date  . COLONOSCOPY     polyps;granular ileitis 2006;2007 normal, Dr Sharlett Iles  . HEMORRHOID SURGERY    . OPERATIVE HYSTEROSCOPY  08-05-04   Dr Garwin Brothers  . OVARIAN CYST REMOVAL    . POLYPECTOMY    . UPPER GASTROINTESTINAL ENDOSCOPY      Social History   Social History  . Marital status: Single    Spouse name: N/A  . Number of children: 0  . Years of education: N/A   Occupational History  . [employer]   Social History Main Topics  . Smoking status: Never Smoker  . Smokeless tobacco: Never Used  . Alcohol use No  . Drug use: No  . Sexual activity: Not on file   Other Topics Concern  . Not on file   Social History Narrative  . No narrative on file    Family History  Problem Relation Age of Onset  . Lymphoma Father   . Diabetes Mother   . Hypertension Mother   . Heart disease Mother   . Penile cancer Paternal Grandfather   . Diabetes Brother        CAD; Dialysis  . Hypertension Brother   . Heart attack Unknown        1/2 maternal aunts X 2  . Rectal cancer Paternal Grandmother   . Colon cancer Maternal Grandmother   . COPD Neg Hx   . Esophageal cancer Neg Hx   . Stomach cancer Neg Hx   . Stroke Neg Hx     Review of Systems  Constitutional: Negative for fever.  Eyes: Positive for visual disturbance (blurry - intermittent).  Respiratory: Negative for shortness of breath.   Cardiovascular: Positive for palpitations (occasional at night). Negative for chest pain and leg swelling.  Neurological: Positive for light-headedness (occ) and headaches (occ).       Objective:   Vitals:   01/21/17 1313  BP: (!) 134/92  Pulse: (!) 59  Resp: 16  Temp: 98.5 F (36.9 C)  SpO2: 99%   Wt Readings from Last 3 Encounters:  01/21/17 149 lb (67.6 kg)  01/18/17 152 lb (68.9 kg)  01/12/17 152 lb (68.9 kg)   Body mass index is 26.39 kg/m.   Physical Exam    Constitutional: Appears well-developed and  well-nourished. No distress.  HENT:  Head: Normocephalic and atraumatic.  Neck: Neck supple. No tracheal deviation present. No thyromegaly present.  No cervical lymphadenopathy Cardiovascular: Normal rate, regular rhythm and normal heart sounds.   No murmur heard. No carotid  bruit .  No edema Pulmonary/Chest: Effort normal and breath sounds normal. No respiratory distress. No has no wheezes. No rales.  Skin: Skin is warm and dry. Not diaphoretic.  Psychiatric: Normal mood and affect. Behavior is normal.      Assessment & Plan:    See Problem List for Assessment and Plan of chronic medical problems.

## 2017-03-09 ENCOUNTER — Telehealth: Payer: Self-pay | Admitting: *Deleted

## 2017-03-09 MED ORDER — RABEPRAZOLE SODIUM 20 MG PO TBEC: 20.0000 mg | DELAYED_RELEASE_TABLET | Freq: Every day | ORAL | 0 refills | Status: DC

## 2017-03-09 NOTE — Telephone Encounter (Signed)
Rx sent as per faxed request.

## 2017-03-31 LAB — HM MAMMOGRAPHY

## 2017-04-06 ENCOUNTER — Encounter: Payer: Self-pay | Admitting: Internal Medicine

## 2017-04-26 ENCOUNTER — Ambulatory Visit: Payer: 59 | Admitting: Internal Medicine

## 2017-05-27 ENCOUNTER — Other Ambulatory Visit: Payer: Self-pay | Admitting: Internal Medicine

## 2017-05-30 ENCOUNTER — Other Ambulatory Visit: Payer: Self-pay | Admitting: Internal Medicine

## 2017-06-06 ENCOUNTER — Telehealth: Payer: Self-pay | Admitting: Internal Medicine

## 2017-06-06 MED ORDER — RABEPRAZOLE SODIUM 20 MG PO TBEC: 20.0000 mg | DELAYED_RELEASE_TABLET | Freq: Every day | ORAL | 0 refills | Status: DC

## 2017-06-06 NOTE — Telephone Encounter (Signed)
Patient states she needs medication aciphex refilled and wants to know if she needs to come in for follow up. Pt last seen APP Janett Billow 06-2016

## 2017-06-06 NOTE — Telephone Encounter (Signed)
I have spoken to patient to tell her that we are glad to send refills of Aciphex. She does need an office visit with Dr Hilarie Fredrickson since is has been almost 2 years since he has seen her for any reflux related issues. She verbalizes understanding and has scheduled an appointment for 08/12/17 at 11 am. Aciphex sent to mail order pharmacy per patient request.

## 2017-06-13 ENCOUNTER — Other Ambulatory Visit: Payer: Self-pay | Admitting: Internal Medicine

## 2017-06-13 ENCOUNTER — Telehealth: Payer: Self-pay | Admitting: Internal Medicine

## 2017-06-13 NOTE — Telephone Encounter (Signed)
I have contacted patient who states that we sent the wrong prescription to [facility] and that we did not send Aciphex. I advised that we did send Aciphex, in fact we refilled the same prescription that we have over the last several years. Patient spoke over me several times to tell me that we did not send the correct medication and that she only takes name brand aciphex. States [facility] had to straighten out [facility] about this once before. I advised that I will contact [facility] to see which medication she got, but that our chart shows we sent the medication the same way we always have.   I spoke to [place] at [facility] [employer] who states that patient was sent the generic rabeprazole.  She states that Aciphex was sent in by our office but a rejection was received on their end. She states that probably what happened is Aciphex was not available from the manufactuer at the time of shipment so they gave patient the rabeprazole instead. She also states that if the patient is unhappy with this, she can contact [facility] to ask that they only send Aciphex brand, however, the physicians office is not authorized to do so on a patient's behalf. Patient should call phone [phone]. Jarrett Soho states she will add a note to patient's chart with information regarding this. She also states that if patient has not opened her bottle, she may return the [facility] a local [facility] and send it back to them for a new prescription of Aciphex.  I have advised patient of all of this information and she verbalizes understanding.

## 2017-07-06 ENCOUNTER — Telehealth: Payer: Self-pay | Admitting: Internal Medicine

## 2017-07-06 NOTE — Telephone Encounter (Signed)
Copied from [place] [phone]. Topic: General - Other >> Jul 06, 2017 11:05 AM Darl Householder, RMA wrote: Reason for CRM: Medication refill request for irbesartan (AVAPRO) 150 MG to be sent to [facility]

## 2017-07-06 NOTE — Telephone Encounter (Signed)
[facility] contacted and stated that Irbesartan(Avapro) 150 mg tablet was on back order and had been recalled. Pharmacy wanting to know if the pt could be changed to another medication. Pharmacy states that notification was faxed to the office as well. [place] can be contacted at [phone].

## 2017-07-06 NOTE — Telephone Encounter (Signed)
LVm with pt to call back to discuss. Can RX be sent to another pharmacy rather than changing the medication.

## 2017-07-06 NOTE — Telephone Encounter (Signed)
Pt states she only has 5 pills left of prescribed medication. Informed pt that when prescription was written it was for 90 tablets and she should have enough medication to last for 3 months. Pt states [facility] said that did no have enough medication, but the pt is unsure of exactly what the pharmacy said regarding her medication.

## 2017-07-07 MED ORDER — IRBESARTAN 150 MG PO TABS: 150.0000 mg | ORAL_TABLET | Freq: Every day | ORAL | 0 refills | Status: DC

## 2017-07-07 NOTE — Telephone Encounter (Signed)
Relation to pt: self Call back number: [phone]   Reason for call:  Patient returned call and would like medication sent to : [facility] [address], [place], [place] [address] [phone]

## 2017-07-07 NOTE — Telephone Encounter (Signed)
[facility] notified[facility]

## 2017-07-12 ENCOUNTER — Other Ambulatory Visit (INDEPENDENT_AMBULATORY_CARE_PROVIDER_SITE_OTHER): Payer: 59

## 2017-07-12 ENCOUNTER — Encounter: Payer: Self-pay | Admitting: Internal Medicine

## 2017-07-12 ENCOUNTER — Ambulatory Visit: Payer: 59 | Admitting: Internal Medicine

## 2017-07-12 VITALS — BP 134/90 | HR 61 | Temp 98.0°F | Resp 16 | Wt 152.0 lb

## 2017-07-12 DIAGNOSIS — R7303 Prediabetes: Secondary | ICD-10-CM

## 2017-07-12 DIAGNOSIS — Z13820 Encounter for screening for osteoporosis: Secondary | ICD-10-CM | POA: Diagnosis not present

## 2017-07-12 DIAGNOSIS — E7849 Other hyperlipidemia: Secondary | ICD-10-CM

## 2017-07-12 DIAGNOSIS — M2550 Pain in unspecified joint: Secondary | ICD-10-CM

## 2017-07-12 DIAGNOSIS — M79642 Pain in left hand: Secondary | ICD-10-CM | POA: Insufficient documentation

## 2017-07-12 DIAGNOSIS — K219 Gastro-esophageal reflux disease without esophagitis: Secondary | ICD-10-CM

## 2017-07-12 DIAGNOSIS — E2839 Other primary ovarian failure: Secondary | ICD-10-CM | POA: Diagnosis not present

## 2017-07-12 DIAGNOSIS — I10 Essential (primary) hypertension: Secondary | ICD-10-CM

## 2017-07-12 LAB — LIPID PANEL
Cholesterol: 181 mg/dL (ref 0–200)
HDL: 68.9 mg/dL (ref >39.00)
LDL CALC: 89 mg/dL (ref 0–99)
NONHDL: 112.23
Total CHOL/HDL Ratio: 3
Triglycerides: 117 mg/dL (ref 0.0–149.0)
VLDL: 23.4 mg/dL (ref 0.0–40.0)

## 2017-07-12 LAB — COMPREHENSIVE METABOLIC PANEL
ALK PHOS: 83 U/L (ref 39–117)
ALT: 18 U/L (ref 0–35)
AST: 18 U/L (ref 0–37)
Albumin: 4 g/dL (ref 3.5–5.2)
BUN: 12 mg/dL (ref 6–23)
CO2: 31 mEq/L (ref 19–32)
Calcium: 10.6 mg/dL — ABNORMAL HIGH (ref 8.4–10.5)
Chloride: 101 mEq/L (ref 96–112)
Creatinine, Ser: 0.69 mg/dL (ref 0.40–1.20)
GFR: 109.94 mL/min (ref >60.00)
GLUCOSE: 79 mg/dL (ref 70–99)
POTASSIUM: 4.3 meq/L (ref 3.5–5.1)
SODIUM: 137 meq/L (ref 135–145)
TOTAL PROTEIN: 7.5 g/dL (ref 6.0–8.3)
Total Bilirubin: 0.5 mg/dL (ref 0.2–1.2)

## 2017-07-12 LAB — HEMOGLOBIN A1C: HEMOGLOBIN A1C: 6.1 % (ref 4.6–6.5)

## 2017-07-12 NOTE — Assessment & Plan Note (Signed)
Did not tolerate more than one statin Will check lipids, cmp Regular exercise and healthy diet encouraged

## 2017-07-12 NOTE — Assessment & Plan Note (Addendum)
BP Readings from Last 3 Encounters:  07/12/17 134/90  01/21/17 (!) 134/92  01/18/17 (!) 154/68   BP not ideally controlled She does not want to make any medication changes -- she wants to work on lifestyle changes Continue current medications at current doses cmp

## 2017-07-12 NOTE — Assessment & Plan Note (Signed)
Check a1c Low sugar / carb diet Stressed regular exercise, weight loss  

## 2017-07-12 NOTE — Assessment & Plan Note (Addendum)
Right knee, b/l hands, right shoulder Sometimes tylenol helps Heat helps Biofreeze helps  Stressed regular exercise Discussed ortho referral - deferred today - can refer when she wants

## 2017-07-12 NOTE — Patient Instructions (Addendum)
  Test(s) ordered today. Your results will be released to [place] (or called to you) after review, usually within 72hours after test completion. If any changes need to be made, you will be notified at that same time.  No immunizations administered today.   Medications reviewed and updated.  No changes recommended at this time.   A dexa scan was ordered.   Please followup in 6 months

## 2017-07-12 NOTE — Progress Notes (Addendum)
Subjective:    Patient ID: Karen Evans, female    DOB: 11-17-1950, 67 y.o.   MRN: [id number]  HPI The patient is here for follow up.  Hypertension: She is taking her medication daily. She is compliant with a low sodium diet.  She denies chest pain, palpitations, edema and regular headaches. She is exercising some - walks a lot at work, does stretching exercises.      Prediabetes:  She is not always compliant with a low sugar/carbohydrate diet.  She is exercising regularly.  Hyperlipidemia: She is taking her medication daily. She is compliant with a low fat/cholesterol diet. She is exercising regularly. She denies myalgias.   GERD:  She is taking her medication daily as prescribed.  She denies any GERD symptoms and feels her GERD is well controlled.   Joint pain:  She has multiple joints that hurt. She has b/l hands/finger pain.  She has intermittent numbness in her right hand.  She has intermittent right knee pain.  She denies left knee pain.  Her right shoulder also hurts.  Heat has not helped.   Medications and allergies reviewed with patient and updated if appropriate.  Patient Active Problem List   Diagnosis Date Noted  . Blurry vision 01/21/2017  . Change in bowel habits 06/18/2016  . Rectal bleeding 04/27/2016  . Prediabetes 10/27/2015  . History of colonic polyps 10/12/2013  . Constipation 10/12/2013  . Dyspnea 07/23/2013  . Hyperlipidemia 03/29/2011  . GERD (gastroesophageal reflux disease) 02/18/2011  . 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 VITAMIN D DEFICIENCY 03/26/2010  . IRRITABLE BOWEL SYNDROME 04/25/2008  . HIATAL HERNIA 04/24/2008  . RHINITIS 03/14/2008  . History of uterine fibroid 12/29/2007  . Goiter 12/29/2007  . Carpal tunnel syndrome 12/29/2007  . Essential hypertension 12/29/2007  . ANEMIA-NOS 07/03/2006    Current Outpatient Medications on File Prior to Visit  Medication Sig Dispense Refill  . acetaminophen (TYLENOL) 500 MG tablet  Take 250-500 mg by mouth every 6 (six) hours as needed for headache (pain).    . ACIPHEX 20 MG tablet TAKE 1 TABLET DAILY 90 tablet 0  . atenolol (TENORMIN) 50 MG tablet Take 1 tablet (50 mg total) by mouth every morning. 90 tablet 3  . Calcium Carbonate-Vitamin D (CALTRATE 600+D PO) Take 1 tablet by mouth daily.     . cholecalciferol (VITAMIN D) 1000 units tablet Take 1,000 Units by mouth daily.    . fexofenadine (ALLEGRA) 180 MG tablet Take 180 mg by mouth daily as needed for allergies.     . hydrocortisone (ANUSOL-HC) 25 MG suppository Place 1 suppository (25 mg total) rectally 2 (two) times daily. (Patient taking differently: Place 25 mg rectally 2 (two) times daily as needed for hemorrhoids or itching. ) 14 suppository 0  . hydrocortisone cream 1 % Apply 1 application topically 2 (two) times daily as needed for itching (rash).    . irbesartan (AVAPRO) 150 MG tablet Take 1 tablet (150 mg total) by mouth daily. 90 tablet 0  . Lactase (LACTAID PO) Take 1 tablet by mouth 2 (two) times daily as needed (stomach upset/ gas).     . Multiple Vitamin (MULTIVITAMIN WITH MINERALS) TABS tablet Take 1 tablet by mouth daily.    . RABEprazole (ACIPHEX) 20 MG tablet Take 1 tablet (20 mg total) by mouth daily. 90 tablet 0   No current facility-administered medications on file prior to visit.     Past Medical History:  Diagnosis Date  . Allergy   .  Anemia    PMH of   . Colon polyp   . Crohn's    ileitis/mild; Dr Sharlett Iles  . Duodenitis without mention of hemorrhage 2007   EGD  . Fibroid, uterine    Dr Garwin Brothers; G 0 P 0  . GERD (gastroesophageal reflux disease)   . Goiter    CTS,RUE > LUE  . Heart murmur   . Hiatal hernia 2002   EGD  . Hypertension   . Rectal polyp 01/24/2012   TUBULAR ADENOMA  . Reflux esophagitis 2007   EGD  . Stricture and stenosis of esophagus 2007   EGD  . Ulcer   . Vitamin D deficiency     Past Surgical History:  Procedure Laterality Date  . COLONOSCOPY      polyps;granular ileitis 2006;2007 normal, Dr Sharlett Iles  . HEMORRHOID SURGERY    . OPERATIVE HYSTEROSCOPY  08-05-04   Dr Garwin Brothers  . OVARIAN CYST REMOVAL    . POLYPECTOMY    . UPPER GASTROINTESTINAL ENDOSCOPY      Social History   Socioeconomic History  . Marital status: Single    Spouse name: None  . Number of children: 0  . Years of education: None  . Highest education level: None  Social Needs  . Financial resource strain: None  . Food insecurity - worry: None  . Food insecurity - inability: None  . Transportation needs - medical: None  . Transportation needs - non-medical: None  Occupational History  . Occupation: TESTER    Employer: [employer]  Tobacco Use  . Smoking status: Never Smoker  . Smokeless tobacco: Never Used  Substance and Sexual Activity  . Alcohol use: No  . Drug use: No  . Sexual activity: None  Other Topics Concern  . None  Social History Narrative  . None    Family History  Problem Relation Age of Onset  . Lymphoma Father   . Diabetes Mother   . Hypertension Mother   . Heart disease Mother   . Penile cancer Paternal Grandfather   . Diabetes Brother        CAD; Dialysis  . Hypertension Brother   . Heart attack Unknown        1/2 maternal aunts X 2  . Rectal cancer Paternal Grandmother   . Colon cancer Maternal Grandmother   . COPD Neg Hx   . Esophageal cancer Neg Hx   . Stomach cancer Neg Hx   . Stroke Neg Hx     Review of Systems  Constitutional: Negative for chills and fever.  Respiratory: Positive for shortness of breath (chronic). Negative for cough and wheezing.   Cardiovascular: Negative for chest pain, palpitations and leg swelling.  Gastrointestinal: Negative for abdominal pain.  Musculoskeletal: Positive for arthralgias.  Neurological: Negative for light-headedness and headaches.       Objective:   Vitals:   07/12/17 1337  BP: 134/90  Pulse: 61  Resp: 16  Temp: 98 F (36.7 C)  SpO2: 96%   Wt Readings  from Last 3 Encounters:  07/12/17 152 lb (68.9 kg)  01/21/17 149 lb (67.6 kg)  01/18/17 152 lb (68.9 kg)   Body mass index is 26.93 kg/m.   Physical Exam    Constitutional: Appears well-developed and well-nourished. No distress.  HENT:  Head: Normocephalic and atraumatic.  Neck: Neck supple. No tracheal deviation present. No thyromegaly present.  No cervical lymphadenopathy Cardiovascular: Normal rate, regular rhythm and normal heart sounds.   No murmur heard. No carotid  bruit .  No edema Pulmonary/Chest: Effort normal and breath sounds normal. No respiratory distress. No has no wheezes. No rales.  Skin: Skin is warm and dry. Not diaphoretic.  Psychiatric: Normal mood and affect. Behavior is normal.      Assessment & Plan:    See Problem List for Assessment and Plan of chronic medical problems.

## 2017-07-12 NOTE — Assessment & Plan Note (Signed)
GERD controlled Continue daily medication  

## 2017-07-21 ENCOUNTER — Ambulatory Visit (INDEPENDENT_AMBULATORY_CARE_PROVIDER_SITE_OTHER)
Admission: RE | Admit: 2017-07-21 | Discharge: 2017-07-21 | Disposition: A | Discharge status: Home / Self Care | Payer: 59 | Source: Ambulatory Visit | Attending: Internal Medicine | Admitting: Internal Medicine

## 2017-07-21 DIAGNOSIS — E2839 Other primary ovarian failure: Secondary | ICD-10-CM

## 2017-07-21 DIAGNOSIS — Z13820 Encounter for screening for osteoporosis: Secondary | ICD-10-CM

## 2017-07-25 ENCOUNTER — Encounter: Payer: Self-pay | Admitting: Internal Medicine

## 2017-07-25 ENCOUNTER — Encounter: Payer: Self-pay | Admitting: Emergency Medicine

## 2017-07-25 DIAGNOSIS — M8580 Other specified disorders of bone density and structure, unspecified site: Secondary | ICD-10-CM | POA: Insufficient documentation

## 2017-07-26 ENCOUNTER — Encounter: Payer: Self-pay | Admitting: *Deleted

## 2017-08-12 ENCOUNTER — Ambulatory Visit: Payer: 59 | Admitting: Internal Medicine

## 2017-08-12 ENCOUNTER — Encounter: Payer: Self-pay | Admitting: Internal Medicine

## 2017-08-12 VITALS — BP 120/80 | HR 52 | Ht 63.0 in | Wt 151.0 lb

## 2017-08-12 DIAGNOSIS — Z860100 Personal history of colon polyps, unspecified: Secondary | ICD-10-CM

## 2017-08-12 DIAGNOSIS — K219 Gastro-esophageal reflux disease without esophagitis: Secondary | ICD-10-CM

## 2017-08-12 DIAGNOSIS — K589 Irritable bowel syndrome without diarrhea: Secondary | ICD-10-CM

## 2017-08-12 MED ORDER — RABEPRAZOLE SODIUM 20 MG PO TBEC: 20.0000 mg | DELAYED_RELEASE_TABLET | Freq: Every day | ORAL | 3 refills | Status: DC

## 2017-08-12 NOTE — Patient Instructions (Signed)
Normal BMI (Body Mass Index- based on height and weight) is between 19 and 25. Your BMI today is Body mass index is 26.75 kg/m. Marland Kitchen Please consider follow up  regarding your BMI with your Primary Care Provider.   We have sent  medications to your pharmacy for you to pick up at your convenience:  Please follow up in 1 year with Dr Hilarie Fredrickson.

## 2017-08-12 NOTE — Progress Notes (Signed)
Subjective:    Patient ID: Karen Evans, female    DOB: Apr 20, 1951, 67 y.o.   MRN: [id number]  HPI Karen Evans is a 67 year old female with a past medical history of colon polyps, GERD, IBS who is here for follow-up.  She was last seen 1 year ago at the time of her colonoscopy.  She presents alone today.  Colonoscopy was performed on 07/29/2016 which revealed 2 sessile polyps in the transverse colon which were removed and found to be tubular adenoma.  Largest 6 mm in size.  Localized area of nodular mucosa was found in the rectum but on biopsies was prolapsed tissue without adenomatous change.  She has been taking AcipHex 20 mg daily and with this her reflux is been well controlled.  No trouble swallowing, nausea or vomiting.  No abdominal pain.  Bowel movements have been regular without blood in her stool or melena.  Constipation is been less of an issue of late.  She continues to work for [employer] but hopes to retire after turning 12 this summer.   Review of Systems As per HPI, otherwise negative  Current Medications, Allergies, Past Medical History, Past Surgical History, Family History and Social History were reviewed in [employer] record.     Objective:   Physical Exam BP 120/80   Pulse (!) 52   Ht 5\' 3"  (1.6 m)   Wt 151 lb (68.5 kg)   BMI 26.75 kg/m  Constitutional: Well-developed and well-nourished. No distress. HEENT: Normocephalic and atraumatic. Conjunctivae are normal.  No scleral icterus. Neck: Neck supple. Trachea midline. Cardiovascular: Normal rate, regular rhythm and intact distal pulses.  Pulmonary/chest: Effort normal and breath sounds normal. No wheezing, rales or rhonchi. Abdominal: Soft, nontender, nondistended. Bowel sounds active throughout.  Extremities: no clubbing, cyanosis, or edema Neurological: Alert and oriented to person place and time. Skin: Skin is warm and dry. Psychiatric: Normal mood and affect. Behavior is  normal.  CBC    Component Value Date/Time   WBC 6.3 11/03/2016 1108   RBC 5.13 (H) 11/03/2016 1108   HGB 13.4 11/03/2016 1108   HCT 41.7 11/03/2016 1108   PLT 264.0 11/03/2016 1108   MCV 81.3 11/03/2016 1108   MCHC 32.2 11/03/2016 1108   RDW 13.8 11/03/2016 1108   LYMPHSABS 2.2 11/03/2016 1108   MONOABS 0.6 11/03/2016 1108   EOSABS 0.1 11/03/2016 1108   BASOSABS 0.1 11/03/2016 1108   CMP     Component Value Date/Time   NA 137 07/12/2017 1420   K 4.3 07/12/2017 1420   CL 101 07/12/2017 1420   CO2 31 07/12/2017 1420   GLUCOSE 79 07/12/2017 1420   BUN 12 07/12/2017 1420   CREATININE 0.69 07/12/2017 1420   CALCIUM 10.6 (H) 07/12/2017 1420   PROT 7.5 07/12/2017 1420   ALBUMIN 4.0 07/12/2017 1420   AST 18 07/12/2017 1420   ALT 18 07/12/2017 1420   ALKPHOS 83 07/12/2017 1420   BILITOT 0.5 07/12/2017 1420   GFRNONAA 118.58 03/26/2010 1024   GFRAA 96 04/25/2008 1538       Assessment & Plan:  67 year old female with a past medical history of colon polyps, GERD, IBS who is here for follow-up  1.  GERD --well-controlled with AcipHex 20 mg daily.  No alarm symptoms.  We discussed the risk, benefits and alternatives to chronic PPI and she wishes to continue this medicine.  2.  IBS with history of constipation --constipation has been less of an issue for her of late.  This condition is stable.  3.  History of adenomatous polyp --repeat colonoscopy on 5-year interval which for her would be March 2023  Follow-up every 1-2 years, sooner if needed 15 minutes spent with the patient today. Greater than 50% was spent in counseling and coordination of care with the patient

## 2017-08-29 ENCOUNTER — Telehealth: Payer: Self-pay | Admitting: Internal Medicine

## 2017-08-29 NOTE — Telephone Encounter (Signed)
Patient calling back states she goes in to work at 3:30pm today if it is possible to speak with Dottie before then. Best call back [phone].

## 2017-08-29 NOTE — Telephone Encounter (Signed)
I have spoken to patient who states that she "has heartburn but only when it is raining outside." States "I wanted to see if I need to take something other than my Aciphex." Again advised patient that she has tried most other PPI's but has had negative side effects. I reminded her of reflux precautions and to take Aciphex 20-30 minutes before breakfast meals. She states that she will try this.

## 2017-08-29 NOTE — Telephone Encounter (Signed)
Patient has previously been tried and has had reaction or intolerance to zantac, omeprazole (hives), esomeprazole (hives), aciphex (at one time she told us it was ineffective but has since retried and has done well), lansoprazole, pantoprazole (joint pains).   I left message for patient advising that it is best she remain on Aciphex since pantoprazole previously has caused her to have joint pains (09/26/15 telephone note). She is to call back with any additional questions.

## 2017-09-26 ENCOUNTER — Telehealth: Payer: Self-pay | Admitting: Internal Medicine

## 2017-09-26 NOTE — Telephone Encounter (Signed)
Made pt an apt for tomorow

## 2017-09-26 NOTE — Progress Notes (Signed)
Subjective:    Patient ID: Karen Evans, female    DOB: 1951/01/19, 67 y.o.   MRN: [id number]  HPI She is here for an acute visit for cold symptoms.  Her symptoms started last week.   She is experiencing bilateral conjunctivitis and she did see her eye doctor yesterday and was placed on eyedrops.  She is also experiencing a cough that is sometimes productive, but has improved.  She has heard some wheezing at night when lying down.  She states nasal congestion, runny nose, sore throat, ringing in her ears, hoarseness and lightheadedness.  She denies any fevers, chills, ear pain, sinus pain, shortness of breath and headaches.  She has taken allegra, tylenol, throat lozenges.  She did not go to work yesterday.  Medications and allergies reviewed with patient and updated if appropriate.  Patient Active Problem List   Diagnosis Date Noted  . Osteopenia 07/25/2017  . Joint pain 07/12/2017  . Blurry vision 01/21/2017  . Change in bowel habits 06/18/2016  . Rectal bleeding 04/27/2016  . Prediabetes 10/27/2015  . History of colonic polyps 10/12/2013  . Constipation 10/12/2013  . Dyspnea 07/23/2013  . Hyperlipidemia 03/29/2011  . GERD (gastroesophageal reflux disease) 02/18/2011  . 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 VITAMIN D DEFICIENCY 03/26/2010  . IRRITABLE BOWEL SYNDROME 04/25/2008  . HIATAL HERNIA 04/24/2008  . RHINITIS 03/14/2008  . History of uterine fibroid 12/29/2007  . Goiter 12/29/2007  . Carpal tunnel syndrome 12/29/2007  . Essential hypertension 12/29/2007  . ANEMIA-NOS 07/03/2006    Current Outpatient Medications on File Prior to Visit  Medication Sig Dispense Refill  . acetaminophen (TYLENOL) 500 MG tablet Take 250-500 mg by mouth every 6 (six) hours as needed for headache (pain).    Marland Kitchen atenolol (TENORMIN) 50 MG tablet Take 1 tablet (50 mg total) by mouth every morning. 90 tablet 3  . Calcium Carbonate-Vitamin D (CALTRATE 600+D PO) Take 1 tablet  by mouth daily.     . cholecalciferol (VITAMIN D) 1000 units tablet Take 1,000 Units by mouth daily.    . fexofenadine (ALLEGRA) 180 MG tablet Take 180 mg by mouth daily as needed for allergies.     . hydrocortisone (ANUSOL-HC) 25 MG suppository Place 1 suppository (25 mg total) rectally 2 (two) times daily. (Patient taking differently: Place 25 mg rectally 2 (two) times daily as needed for hemorrhoids or itching. ) 14 suppository 0  . hydrocortisone cream 1 % Apply 1 application topically 2 (two) times daily as needed for itching (rash).    . irbesartan (AVAPRO) 150 MG tablet Take 1 tablet (150 mg total) by mouth daily. 90 tablet 0  . Lactase (LACTAID PO) Take 1 tablet by mouth 2 (two) times daily as needed (stomach upset/ gas).     . Multiple Vitamin (MULTIVITAMIN WITH MINERALS) TABS tablet Take 1 tablet by mouth daily.    . RABEprazole (ACIPHEX) 20 MG tablet Take 1 tablet (20 mg total) by mouth daily. 90 tablet 3  . tobramycin-dexamethasone (TOBRADEX) ophthalmic solution Place 1 drop into both eyes 4 (four) times daily.     No current facility-administered medications on file prior to visit.     Past Medical History:  Diagnosis Date  . Allergy   . Anemia    PMH of   . Colon polyp   . Crohn's    ileitis/mild; Dr Sharlett Iles  . Duodenitis without mention of hemorrhage 2007   EGD  . Fibroid, uterine    Dr Garwin Brothers; [place]  0 P 0  . GERD (gastroesophageal reflux disease)   . Goiter    CTS,RUE > LUE  . Heart murmur   . Hiatal hernia 2002   EGD  . Hypertension   . Internal hemorrhoids   . Rectal polyp 01/24/2012   TUBULAR ADENOMA  . Reflux esophagitis 2007   EGD  . Stricture and stenosis of esophagus 2007   EGD  . Tubular adenoma of colon   . Ulcer   . Vitamin D deficiency     Past Surgical History:  Procedure Laterality Date  . COLONOSCOPY     polyps;granular ileitis 2006;2007 normal, Dr Sharlett Iles  . HEMORRHOID SURGERY    . OPERATIVE HYSTEROSCOPY  08-05-04   Dr Garwin Brothers  .  OVARIAN CYST REMOVAL    . POLYPECTOMY    . UPPER GASTROINTESTINAL ENDOSCOPY      Social History   Socioeconomic History  . Marital status: Single    Spouse name: Not on file  . Number of children: 0  . Years of education: Not on file  . Highest education level: Not on file  Occupational History  . Occupation: TESTER    Employer: [employer]  . Financial resource strain: Not on file  . Food insecurity:    Worry: Not on file    Inability: Not on file  . Transportation needs:    Medical: Not on file    Non-medical: Not on file  Tobacco Use  . Smoking status: Never Smoker  . Smokeless tobacco: Never Used  Substance and Sexual Activity  . Alcohol use: No  . Drug use: No  . Sexual activity: Not on file  Lifestyle  . Physical activity:    Days per week: Not on file    Minutes per session: Not on file  . Stress: Not on file  Relationships  . Social connections:    Talks on phone: Not on file    Gets together: Not on file    Attends religious service: Not on file    Active member of club or organization: Not on file    Attends meetings of clubs or organizations: Not on file    Relationship status: Not on file  Other Topics Concern  . Not on file  Social History Narrative  . Not on file    Family History  Problem Relation Age of Onset  . Lymphoma Father   . Diabetes Mother   . Hypertension Mother   . Heart disease Mother   . Penile cancer Paternal Grandfather   . Diabetes Brother        CAD; Dialysis  . Hypertension Brother   . Heart attack Unknown        1/2 maternal aunts X 2  . Rectal cancer Paternal Grandmother   . Colon cancer Maternal Grandmother   . COPD Neg Hx   . Esophageal cancer Neg Hx   . Stomach cancer Neg Hx   . Stroke Neg Hx     Review of Systems  Constitutional: Negative for chills and fever.  HENT: Positive for congestion, rhinorrhea, sore throat, tinnitus and voice change. Negative for ear pain and sinus pain.     Eyes: Positive for discharge (clear).  Respiratory: Positive for cough (sometimes productive) and wheezing (night only). Negative for shortness of breath.   Neurological: Positive for light-headedness. Negative for headaches.       Objective:   Vitals:   09/27/17 1113  BP: 132/84  Pulse: 85  Resp:  16  Temp: 98.8 F (37.1 C)  SpO2: 94%   Filed Weights   09/27/17 1113  Weight: 152 lb (68.9 kg)   Body mass index is 26.93 kg/m.  Wt Readings from Last 3 Encounters:  09/27/17 152 lb (68.9 kg)  08/12/17 151 lb (68.5 kg)  07/12/17 152 lb (68.9 kg)     Physical Exam GENERAL APPEARANCE: Appears stated age, well appearing, NAD EYES: conjunctiva slightly erythematous, no icterus HEENT: bilateral tympanic membranes and ear canals normal, oropharynx with no erythema, no thyromegaly, trachea midline, no cervical or supraclavicular lymphadenopathy LUNGS: Clear to auscultation without wheeze or crackles, unlabored breathing, good air entry bilaterally CARDIOVASCULAR: Normal S1,S2 without murmurs, no edema SKIN: warm, dry       Assessment & Plan:   See Problem List for Assessment and Plan of chronic medical problems.

## 2017-09-26 NOTE — Telephone Encounter (Signed)
Copied from [place] [id number]. Topic: Quick Communication - See Telephone Encounter >> Sep 26, 2017  3:37 PM Aurelio Brash B wrote: CRM for notification. See Telephone encounter for: 09/26/17. PT is asking if Dr Quay Burow will send her an antibiotic   For her coughing up mucus , she has been coughing since Friday.  She states she received eye drop for eye infection from [facility] today.    [place], [place] - [address] [phone] (Phone) [phone] (Fax)

## 2017-09-27 ENCOUNTER — Ambulatory Visit: Payer: 59 | Admitting: Internal Medicine

## 2017-09-27 ENCOUNTER — Encounter: Payer: Self-pay | Admitting: Internal Medicine

## 2017-09-27 VITALS — BP 132/84 | HR 85 | Temp 98.8°F | Resp 16 | Wt 152.0 lb

## 2017-09-27 DIAGNOSIS — J069 Acute upper respiratory infection, unspecified: Secondary | ICD-10-CM | POA: Diagnosis not present

## 2017-09-27 NOTE — Assessment & Plan Note (Signed)
Likely viral in nature There has been some improvement in her symptoms over the past couple of days Continue over-the-counter symptomatic treatment She is using antibacterial eyedrops prescribed by her ophthalmologist Will stay out of work today and tomorrow If symptoms do not continue to improve or worsen she will call and I will consider an antibiotic at that time

## 2017-09-27 NOTE — Patient Instructions (Signed)
You do not need an antibiotic at this time.    If your symptoms worsen or fail to improve, please contact our office for further instruction, or in case of emergency go directly to the emergency room at the closest medical facility.   General Recommendations:    Please drink plenty of fluids.  Get plenty of rest   Sleep in humidified air  Use saline nasal sprays  Netti pot  OTC Medications:  Decongestants - helps relieve congestion   Flonase (generic fluticasone) or Nasacort (generic triamcinolone) - please make sure to use the "cross-over" technique at a 45 degree angle towards the opposite eye as opposed to straight up the nasal passageway.   Sudafed (generic pseudoephedrine - Note this is the one that is available behind the pharmacy counter); Products with phenylephrine (-PE) may also be used but is often not as effective as pseudoephedrine.   If you have HIGH BLOOD PRESSURE - Coricidin HBP; AVOID any product that is -D as this contains pseudoephedrine which may increase your blood pressure.  Afrin (oxymetazoline) every 6-8 hours for up to 3 days.  Allergies - helps relieve runny nose, itchy eyes and sneezing   Claritin (generic loratidine), Allegra (fexofenidine), or Zyrtec (generic cyrterizine) for runny nose. These medications should not cause drowsiness.  Note - Benadryl (generic diphenhydramine) may be used however may cause drowsiness  Cough -   Delsym or Robitussin (generic dextromethorphan)  Expectorants - helps loosen mucus to ease removal   Mucinex (generic guaifenesin) as directed on the package.  Headaches / General Aches   Tylenol (generic acetaminophen) - DO NOT EXCEED 3 grams (3,000 mg) in a 24 hour time period  Advil/Motrin (generic ibuprofen)  Sore Throat -   Salt water gargle   Chloraseptic (generic benzocaine) spray or lozenges / Sucrets (generic dyclonine)

## 2017-09-28 ENCOUNTER — Telehealth: Payer: Self-pay | Admitting: Internal Medicine

## 2017-09-28 NOTE — Telephone Encounter (Signed)
Copied from [place] [id number]. Topic: Quick Communication - Rx Refill/Question >> Sep 28, 2017  2:40 PM Scherrie Gerlach wrote: Medication: a abx Pt was seen yesterday and advised if she did not h=get better to call back.  Pt states she is not better and requesting a Rx be called in to the pharmacy. Pt has hoarseness, congestion.  Pt states she needs to go to work tomorrow and she cannot work like this

## 2017-09-28 NOTE — Telephone Encounter (Signed)
She has allergies to several antibiotics  - what can she take?  Can she take zpak?

## 2017-09-29 MED ORDER — LEVOFLOXACIN 500 MG PO TABS: 500.0000 mg | ORAL_TABLET | Freq: Every day | ORAL | 0 refills | Status: DC

## 2017-09-29 NOTE — Telephone Encounter (Signed)
Spoke with pt to inform.  

## 2017-09-29 NOTE — Telephone Encounter (Signed)
Patient checking statu, please advise. Call back [phone]

## 2017-09-29 NOTE — Telephone Encounter (Signed)
Pt is not able to take Z-pack. She is unsure what she can take. please advise.

## 2017-09-29 NOTE — Telephone Encounter (Signed)
Antibiotic sent to [facility].  All antibiotic have possible side effects so we need to try to avoid them in the future.  If she has any numbness/tingling or tendon/joint pain while taking the antibiotic she needs to call immediately

## 2017-10-02 ENCOUNTER — Other Ambulatory Visit: Payer: Self-pay | Admitting: Internal Medicine

## 2017-10-14 ENCOUNTER — Other Ambulatory Visit: Payer: Self-pay | Admitting: Gastroenterology

## 2017-10-14 ENCOUNTER — Telehealth: Payer: Self-pay | Admitting: Internal Medicine

## 2017-10-14 NOTE — Telephone Encounter (Signed)
Pt states she was having issues with diarrhea and her bottom was sore. Diarrhea has stopped but pt has suppositories she will use for the soreness. Also recommended pt try [facility] for discomfort that she can purchase otc. Pt verbalized understanding.

## 2017-10-19 ENCOUNTER — Other Ambulatory Visit: Payer: Self-pay | Admitting: Internal Medicine

## 2017-11-03 ENCOUNTER — Other Ambulatory Visit: Payer: Self-pay | Admitting: Internal Medicine

## 2017-11-04 ENCOUNTER — Other Ambulatory Visit: Payer: Self-pay | Admitting: Emergency Medicine

## 2017-11-04 MED ORDER — IRBESARTAN 150 MG PO TABS: 150.0000 mg | ORAL_TABLET | Freq: Every day | ORAL | 0 refills | Status: DC

## 2017-11-22 ENCOUNTER — Other Ambulatory Visit: Payer: Self-pay | Admitting: Internal Medicine

## 2017-12-07 ENCOUNTER — Other Ambulatory Visit: Payer: Self-pay | Admitting: Internal Medicine

## 2017-12-28 ENCOUNTER — Other Ambulatory Visit: Payer: Self-pay | Admitting: Internal Medicine

## 2018-01-08 NOTE — Progress Notes (Signed)
Subjective:    Patient ID: Karen Evans, female    DOB: 02/10/1951, 67 y.o.   MRN: [id number]  HPI     Medications and allergies reviewed with patient and updated if appropriate.  Patient Active Problem List   Diagnosis Date Noted  . Viral upper respiratory tract infection 09/27/2017  . Osteopenia 07/25/2017  . Joint pain 07/12/2017  . Blurry vision 01/21/2017  . Change in bowel habits 06/18/2016  . Rectal bleeding 04/27/2016  . Prediabetes 10/27/2015  . History of colonic polyps 10/12/2013  . Constipation 10/12/2013  . Dyspnea 07/23/2013  . Hyperlipidemia 03/29/2011  . GERD (gastroesophageal reflux disease) 02/18/2011  . 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 VITAMIN D DEFICIENCY 03/26/2010  . IRRITABLE BOWEL SYNDROME 04/25/2008  . HIATAL HERNIA 04/24/2008  . RHINITIS 03/14/2008  . History of uterine fibroid 12/29/2007  . Goiter 12/29/2007  . Carpal tunnel syndrome 12/29/2007  . Essential hypertension 12/29/2007  . ANEMIA-NOS 07/03/2006    Current Outpatient Medications on File Prior to Visit  Medication Sig Dispense Refill  . acetaminophen (TYLENOL) 500 MG tablet Take 250-500 mg by mouth every 6 (six) hours as needed for headache (pain).    . ACIPHEX 20 MG tablet TAKE 1 TABLET DAILY 90 tablet 0  . atenolol (TENORMIN) 50 MG tablet TAKE 1 TABLET BY MOUTH EVERY MORNING 90 tablet 1  . Calcium Carbonate-Vitamin D (CALTRATE 600+D PO) Take 1 tablet by mouth daily.     . cholecalciferol (VITAMIN D) 1000 units tablet Take 1,000 Units by mouth daily.    . fexofenadine (ALLEGRA) 180 MG tablet Take 180 mg by mouth daily as needed for allergies.     . hydrocortisone (ANUSOL-HC) 25 MG suppository Place 1 suppository (25 mg total) rectally 2 (two) times daily. (Patient taking differently: Place 25 mg rectally 2 (two) times daily as needed for hemorrhoids or itching. ) 14 suppository 0  . hydrocortisone cream 1 % Apply 1 application topically 2 (two) times daily as  needed for itching (rash).    . irbesartan (AVAPRO) 150 MG tablet Take 1 tablet (150 mg total) by mouth daily. 31 tablet 0  . irbesartan (AVAPRO) 150 MG tablet TAKE 1 TABLET BY MOUTH ONCE DAILY 90 tablet 0  . Lactase (LACTAID PO) Take 1 tablet by mouth 2 (two) times daily as needed (stomach upset/ gas).     Marland Kitchen levofloxacin (LEVAQUIN) 500 MG tablet Take 1 tablet (500 mg total) by mouth daily. 5 tablet 0  . Multiple Vitamin (MULTIVITAMIN WITH MINERALS) TABS tablet Take 1 tablet by mouth daily.    . RABEprazole (ACIPHEX) 20 MG tablet Take 1 tablet (20 mg total) by mouth daily. 90 tablet 3  . rosuvastatin (CRESTOR) 5 MG tablet TAKE 1 TABLET BY MOUTH ONCE DAILY 90 tablet 1  . tobramycin-dexamethasone (TOBRADEX) ophthalmic solution Place 1 drop into both eyes 4 (four) times daily.     No current facility-administered medications on file prior to visit.     Past Medical History:  Diagnosis Date  . Allergy   . Anemia    PMH of   . Colon polyp   . Crohn's    ileitis/mild; Dr Sharlett Iles  . Duodenitis without mention of hemorrhage 2007   EGD  . Fibroid, uterine    Dr Garwin Brothers; G 0 P 0  . GERD (gastroesophageal reflux disease)   . Goiter    CTS,RUE > LUE  . Heart murmur   . Hiatal hernia 2002   EGD  .  Hypertension   . Internal hemorrhoids   . Rectal polyp 01/24/2012   TUBULAR ADENOMA  . Reflux esophagitis 2007   EGD  . Stricture and stenosis of esophagus 2007   EGD  . Tubular adenoma of colon   . Ulcer   . Vitamin D deficiency     Past Surgical History:  Procedure Laterality Date  . COLONOSCOPY     polyps;granular ileitis 2006;2007 normal, Dr Sharlett Iles  . HEMORRHOID SURGERY    . OPERATIVE HYSTEROSCOPY  08-05-04   Dr Garwin Brothers  . OVARIAN CYST REMOVAL    . POLYPECTOMY    . UPPER GASTROINTESTINAL ENDOSCOPY      Social History   Socioeconomic History  . Marital status: Single    Spouse name: Not on file  . Number of children: 0  . Years of education: Not on file  . Highest  education level: Not on file  Occupational History  . Occupation: TESTER    Employer: [employer]  . Financial resource strain: Not on file  . Food insecurity:    Worry: Not on file    Inability: Not on file  . Transportation needs:    Medical: Not on file    Non-medical: Not on file  Tobacco Use  . Smoking status: Never Smoker  . Smokeless tobacco: Never Used  Substance and Sexual Activity  . Alcohol use: No  . Drug use: No  . Sexual activity: Not on file  Lifestyle  . Physical activity:    Days per week: Not on file    Minutes per session: Not on file  . Stress: Not on file  Relationships  . Social connections:    Talks on phone: Not on file    Gets together: Not on file    Attends religious service: Not on file    Active member of club or organization: Not on file    Attends meetings of clubs or organizations: Not on file    Relationship status: Not on file  Other Topics Concern  . Not on file  Social History Narrative  . Not on file    Family History  Problem Relation Age of Onset  . Lymphoma Father   . Diabetes Mother   . Hypertension Mother   . Heart disease Mother   . Penile cancer Paternal Grandfather   . Diabetes Brother        CAD; Dialysis  . Hypertension Brother   . Heart attack Unknown        1/2 maternal aunts X 2  . Rectal cancer Paternal Grandmother   . Colon cancer Maternal Grandmother   . COPD Neg Hx   . Esophageal cancer Neg Hx   . Stomach cancer Neg Hx   . Stroke Neg Hx     Review of Systems     Objective:  There were no vitals filed for this visit. BP Readings from Last 3 Encounters:  09/27/17 132/84  08/12/17 120/80  07/12/17 134/90   Wt Readings from Last 3 Encounters:  09/27/17 152 lb (68.9 kg)  08/12/17 151 lb (68.5 kg)  07/12/17 152 lb (68.9 kg)   There is no height or weight on file to calculate BMI.   Physical Exam         Assessment & Plan:    See Problem List for Assessment and Plan  of chronic medical problems.   This encounter was created in error - please disregard.

## 2018-01-10 ENCOUNTER — Encounter: Payer: 59 | Admitting: Internal Medicine

## 2018-01-10 DIAGNOSIS — Z0289 Encounter for other administrative examinations: Secondary | ICD-10-CM

## 2018-01-23 NOTE — Progress Notes (Signed)
Subjective:    Patient ID: Karen Evans, female    DOB: 1951/01/17, 67 y.o.   MRN: [id number]  HPI The patient is here for follow up.  Prediabetes:  She is not compliant with a low sugar/carbohydrate diet.  She is exercising regularly - walking.  Hypertension: She is taking her medication daily. She is not compliant with a low sodium diet.  She denies chest pain, palpitations, edema, shortness of breath and regular headaches. She is exercising some-walking.  She does not monitor her blood pressure at work - 110/80-160/70.  Diastolic is always 88-41.  8/25 measures have an elevated SBP.    Hyperlipidemia: She is taking her medication daily. She is somewhat compliant with a low fat/cholesterol diet. She is exercising regularly. She denies myalgias.   GERD:  She is taking her medication daily as prescribed.  She denies any GERD symptoms and feels her GERD is well controlled.     Medications and allergies reviewed with patient and updated if appropriate.  Patient Active Problem List   Diagnosis Date Noted  . Osteopenia 07/25/2017  . Joint pain 07/12/2017  . Blurry vision 01/21/2017  . Change in bowel habits 06/18/2016  . Rectal bleeding 04/27/2016  . Prediabetes 10/27/2015  . History of colonic polyps 10/12/2013  . Constipation 10/12/2013  . Dyspnea 07/23/2013  . Hyperlipidemia 03/29/2011  . GERD (gastroesophageal reflux disease) 02/18/2011  . 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 VITAMIN D DEFICIENCY 03/26/2010  . IRRITABLE BOWEL SYNDROME 04/25/2008  . HIATAL HERNIA 04/24/2008  . RHINITIS 03/14/2008  . History of uterine fibroid 12/29/2007  . Goiter 12/29/2007  . Carpal tunnel syndrome 12/29/2007  . Essential hypertension 12/29/2007  . ANEMIA-NOS 07/03/2006    Current Outpatient Medications on File Prior to Visit  Medication Sig Dispense Refill  . acetaminophen (TYLENOL) 500 MG tablet Take 250-500 mg by mouth every 6 (six) hours as needed for headache  (pain).    . ACIPHEX 20 MG tablet TAKE 1 TABLET DAILY 90 tablet 0  . atenolol (TENORMIN) 50 MG tablet TAKE 1 TABLET BY MOUTH EVERY MORNING 90 tablet 1  . Calcium Carbonate-Vitamin D (CALTRATE 600+D PO) Take 1 tablet by mouth daily.     . cholecalciferol (VITAMIN D) 1000 units tablet Take 1,000 Units by mouth daily.    . fexofenadine (ALLEGRA) 180 MG tablet Take 180 mg by mouth daily as needed for allergies.     . hydrocortisone (ANUSOL-HC) 25 MG suppository Place 1 suppository (25 mg total) rectally 2 (two) times daily. (Patient taking differently: Place 25 mg rectally 2 (two) times daily as needed for hemorrhoids or itching. ) 14 suppository 0  . hydrocortisone cream 1 % Apply 1 application topically 2 (two) times daily as needed for itching (rash).    . irbesartan (AVAPRO) 150 MG tablet TAKE 1 TABLET BY MOUTH ONCE DAILY 90 tablet 0  . Lactase (LACTAID PO) Take 1 tablet by mouth 2 (two) times daily as needed (stomach upset/ gas).     . Multiple Vitamin (MULTIVITAMIN WITH MINERALS) TABS tablet Take 1 tablet by mouth daily.    . RABEprazole (ACIPHEX) 20 MG tablet Take 1 tablet (20 mg total) by mouth daily. 90 tablet 3  . rosuvastatin (CRESTOR) 5 MG tablet TAKE 1 TABLET BY MOUTH ONCE DAILY 90 tablet 1  . tobramycin-dexamethasone (TOBRADEX) ophthalmic solution Place 1 drop into both eyes 4 (four) times daily.     No current facility-administered medications on file prior to visit.  Past Medical History:  Diagnosis Date  . Allergy   . Anemia    PMH of   . Colon polyp   . Crohn's    ileitis/mild; Dr Sharlett Iles  . Duodenitis without mention of hemorrhage 2007   EGD  . Fibroid, uterine    Dr Garwin Brothers; G 0 P 0  . GERD (gastroesophageal reflux disease)   . Goiter    CTS,RUE > LUE  . Heart murmur   . Hiatal hernia 2002   EGD  . Hypertension   . Internal hemorrhoids   . Rectal polyp 01/24/2012   TUBULAR ADENOMA  . Reflux esophagitis 2007   EGD  . Stricture and stenosis of esophagus  2007   EGD  . Tubular adenoma of colon   . Ulcer   . Vitamin D deficiency     Past Surgical History:  Procedure Laterality Date  . COLONOSCOPY     polyps;granular ileitis 2006;2007 normal, Dr Sharlett Iles  . HEMORRHOID SURGERY    . OPERATIVE HYSTEROSCOPY  08-05-04   Dr Garwin Brothers  . OVARIAN CYST REMOVAL    . POLYPECTOMY    . UPPER GASTROINTESTINAL ENDOSCOPY      Social History   Socioeconomic History  . Marital status: Single    Spouse name: Not on file  . Number of children: 0  . Years of education: Not on file  . Highest education level: Not on file  Occupational History  . Occupation: TESTER    Employer: [employer]  . Financial resource strain: Not on file  . Food insecurity:    Worry: Not on file    Inability: Not on file  . Transportation needs:    Medical: Not on file    Non-medical: Not on file  Tobacco Use  . Smoking status: Never Smoker  . Smokeless tobacco: Never Used  Substance and Sexual Activity  . Alcohol use: No  . Drug use: No  . Sexual activity: Not on file  Lifestyle  . Physical activity:    Days per week: Not on file    Minutes per session: Not on file  . Stress: Not on file  Relationships  . Social connections:    Talks on phone: Not on file    Gets together: Not on file    Attends religious service: Not on file    Active member of club or organization: Not on file    Attends meetings of clubs or organizations: Not on file    Relationship status: Not on file  Other Topics Concern  . Not on file  Social History Narrative  . Not on file    Family History  Problem Relation Age of Onset  . Lymphoma Father   . Diabetes Mother   . Hypertension Mother   . Heart disease Mother   . Penile cancer Paternal Grandfather   . Diabetes Brother        CAD; Dialysis  . Hypertension Brother   . Heart attack Unknown        1/2 maternal aunts X 2  . Rectal cancer Paternal Grandmother   . Colon cancer Maternal Grandmother   .  COPD Neg Hx   . Esophageal cancer Neg Hx   . Stomach cancer Neg Hx   . Stroke Neg Hx     Review of Systems  Constitutional: Negative for chills and fever.  Respiratory: Negative for cough, shortness of breath and wheezing.   Cardiovascular: Negative for chest pain, palpitations and leg  swelling.  Neurological: Negative for light-headedness and headaches.       Objective:   Vitals:   01/24/18 1441  BP: 124/78  Pulse: (!) 57  Resp: 16  Temp: 97.9 F (36.6 C)  SpO2: 93%   BP Readings from Last 3 Encounters:  01/24/18 124/78  09/27/17 132/84  08/12/17 120/80   Wt Readings from Last 3 Encounters:  01/24/18 155 lb (70.3 kg)  09/27/17 152 lb (68.9 kg)  08/12/17 151 lb (68.5 kg)   Body mass index is 27.46 kg/m.   Physical Exam    Constitutional: Appears well-developed and well-nourished. No distress.  HENT:  Head: Normocephalic and atraumatic.  Neck: Neck supple. No tracheal deviation present. No thyromegaly present.  No cervical lymphadenopathy Cardiovascular: Normal rate, regular rhythm and normal heart sounds.   No murmur heard. No carotid bruit .  No edema Pulmonary/Chest: Effort normal and breath sounds normal. No respiratory distress. No has no wheezes. No rales.  Skin: Skin is warm and dry. Not diaphoretic.  Psychiatric: Normal mood and affect. Behavior is normal.      Assessment & Plan:    See Problem List for Assessment and Plan of chronic medical problems.

## 2018-01-24 ENCOUNTER — Ambulatory Visit: Payer: 59 | Admitting: Internal Medicine

## 2018-01-24 ENCOUNTER — Encounter: Payer: Self-pay | Admitting: Internal Medicine

## 2018-01-24 ENCOUNTER — Other Ambulatory Visit (INDEPENDENT_AMBULATORY_CARE_PROVIDER_SITE_OTHER): Payer: 59

## 2018-01-24 VITALS — BP 124/78 | HR 57 | Temp 97.9°F | Resp 16 | Ht 63.0 in | Wt 155.0 lb

## 2018-01-24 DIAGNOSIS — I10 Essential (primary) hypertension: Secondary | ICD-10-CM

## 2018-01-24 DIAGNOSIS — E7849 Other hyperlipidemia: Secondary | ICD-10-CM | POA: Diagnosis not present

## 2018-01-24 DIAGNOSIS — R7303 Prediabetes: Secondary | ICD-10-CM

## 2018-01-24 DIAGNOSIS — K219 Gastro-esophageal reflux disease without esophagitis: Secondary | ICD-10-CM

## 2018-01-24 LAB — LIPID PANEL
Cholesterol: 174 mg/dL (ref 0–200)
HDL: 67.3 mg/dL (ref >39.00)
LDL Cholesterol: 69 mg/dL (ref 0–99)
NonHDL: 106.43
TRIGLYCERIDES: 187 mg/dL — AB (ref 0.0–149.0)
Total CHOL/HDL Ratio: 3
VLDL: 37.4 mg/dL (ref 0.0–40.0)

## 2018-01-24 LAB — COMPREHENSIVE METABOLIC PANEL
ALBUMIN: 3.8 g/dL (ref 3.5–5.2)
ALK PHOS: 86 U/L (ref 39–117)
ALT: 15 U/L (ref 0–35)
AST: 15 U/L (ref 0–37)
BILIRUBIN TOTAL: 0.4 mg/dL (ref 0.2–1.2)
BUN: 14 mg/dL (ref 6–23)
CALCIUM: 9.9 mg/dL (ref 8.4–10.5)
CO2: 31 mEq/L (ref 19–32)
CREATININE: 0.92 mg/dL (ref 0.40–1.20)
Chloride: 103 mEq/L (ref 96–112)
GFR: 78.75 mL/min (ref >60.00)
Glucose, Bld: 82 mg/dL (ref 70–99)
Potassium: 4 mEq/L (ref 3.5–5.1)
Sodium: 139 mEq/L (ref 135–145)
TOTAL PROTEIN: 7.3 g/dL (ref 6.0–8.3)

## 2018-01-24 LAB — HEMOGLOBIN A1C: Hgb A1c MFr Bld: 6.2 % (ref 4.6–6.5)

## 2018-01-24 NOTE — Assessment & Plan Note (Addendum)
BP fairly controlled-it is variable, but overall controlled Current regimen effective and well tolerated Continue current medications at current doses Continue regular exercise-do it more consistently Work on weight loss Stressed low-sodium diet cmp

## 2018-01-24 NOTE — Assessment & Plan Note (Signed)
Check A1c Stressed low sugar/carbohydrate diet to prevent diabetes Continue regular exercise and work on weight loss Will refer to nutrition

## 2018-01-24 NOTE — Assessment & Plan Note (Signed)
Check lipid panel, CMP She is taking her cholesterol medication daily Continue regular exercise Work on weight loss Heart healthy diet

## 2018-01-24 NOTE — Assessment & Plan Note (Signed)
GERD controlled Continue daily medication  

## 2018-01-24 NOTE — Patient Instructions (Addendum)
  Test(s) ordered today. Your results will be released to [facility] (or called to you) after review, usually within 72hours after test completion. If any changes need to be made, you will be notified at that same time.    Medications reviewed and updated. No changes recommended at this time.  A referral was ordered for nutrition  Please followup in 6 months

## 2018-02-16 ENCOUNTER — Encounter: Payer: 59 | Attending: Internal Medicine | Admitting: Registered"

## 2018-02-16 ENCOUNTER — Encounter: Payer: Self-pay | Admitting: Registered"

## 2018-02-16 DIAGNOSIS — I10 Essential (primary) hypertension: Secondary | ICD-10-CM | POA: Insufficient documentation

## 2018-02-16 DIAGNOSIS — Z713 Dietary counseling and surveillance: Secondary | ICD-10-CM | POA: Insufficient documentation

## 2018-02-16 DIAGNOSIS — E7849 Other hyperlipidemia: Secondary | ICD-10-CM | POA: Diagnosis not present

## 2018-02-16 DIAGNOSIS — R7303 Prediabetes: Secondary | ICD-10-CM | POA: Insufficient documentation

## 2018-02-16 NOTE — Patient Instructions (Addendum)
-   Have vegetables with lunch and dinner.

## 2018-02-16 NOTE — Progress Notes (Signed)
  Medical Nutrition Therapy:  Appt start time: 11:15 end time:  12:26.  (Name pronounced like Jada) Assessment:  Primary concerns today: Pt states she is here to figure out what kind of foods to eat to prevent diabetes. Per recent lab results, elevated A1c (6.2) and triglycerides (187).   Pt expectations: wants to know foods to eat, wants to know how food affects her body if she eats too much of it  Pt states diabetes runs in her family; mother and brother have it.   Pt states she tries to eat fish sandwich at least 2 times/week because it peps her up. Pt states she sometimes eats chicken noodle soup when her stomach hurts on the weekends. Pt states fast food is her happy food.   Pt states she is process of getting ready to retire. Pt states she works Mon-Fri 4pm-12am. Pt states she does not have local family; mom and brother live in [place].   Preferred Learning Style:   No preference indicated   Learning Readiness:   Not ready  Contemplating  Ready  Change in progress   MEDICATIONS: See list   DIETARY INTAKE:  Usual eating pattern includes 3 meals and 2-3 snacks per day.  Everyday foods include fast food, fried fish, spinach, potatoes, or pizza.  Avoided foods include collard/turnip greens, chocolate, peppermint.    24-hr recall:  B ( AM): cereal + banana or eggs + sausage + toast  Snk ( AM): none  L ( PM): fast food-1/2 sandwich + fries or 1-2 slices of pizza Snk ( PM): peanut butter and crackers or fruit snacks  D ( PM): Immunologist or [employer]-rotisserie chicken + spinach + potatoes Snk ( PM): popcorn or rice krispie treat Beverages: juice, water, 2% milk, slush,   Usual physical activity: ADLs  Estimated energy needs: 1600 calories 180 g carbohydrates 120 g protein 44 g fat  Progress Towards Goal(s):  In progress.   Nutritional Diagnosis:  NI-5.8.5 Inadeqate fiber intake As related to less than optimal food-preparation practices.  As evidenced by dietary  recall of reliance on fast food and convenience foods.    Intervention:  Nutrition education and counseling. Pt was educated on the importance of fiber, being mindful of sodium content, and reading nutrition facts labels. Pt was in agreement with goals listed.  Goals: - Have vegetables with lunch and dinner.   Teaching Method Utilized:  Visual Auditory Hands on  Handouts given during visit include:  My Plate for Diabetes  Hypertension Nutrition Therapy  Barriers to learning/adherence to lifestyle change: had difficulty following nutrition information in appt  Demonstrated degree of understanding via:  Teach Back   Monitoring/Evaluation:  Dietary intake, exercise, and body weight in 1 month(s).

## 2018-02-21 ENCOUNTER — Other Ambulatory Visit: Payer: Self-pay | Admitting: Internal Medicine

## 2018-03-14 ENCOUNTER — Encounter: Payer: Self-pay | Admitting: Registered"

## 2018-03-14 ENCOUNTER — Encounter: Payer: 59 | Admitting: Registered"

## 2018-03-14 ENCOUNTER — Telehealth: Payer: Self-pay | Admitting: Internal Medicine

## 2018-03-14 DIAGNOSIS — I10 Essential (primary) hypertension: Secondary | ICD-10-CM | POA: Diagnosis not present

## 2018-03-14 NOTE — Telephone Encounter (Signed)
Patient states that her bottom is sore and would like a rx for suppositories sent to [facility] in [place].

## 2018-03-14 NOTE — Patient Instructions (Addendum)
-   Contact your primary care provider this week for appointment related to memory.   - Try to have salad with protein and fruit as fast food option on Fridays.  - Call me to update about memory and to possibly schedule next appointment.

## 2018-03-14 NOTE — Progress Notes (Signed)
Medical Nutrition Therapy:  Appt start time: 12:05 end time: 12:40  (Name pronounced like Jada) Assessment:  Primary concerns today: Pt states she is here to figure out what kind of foods to eat to prevent diabetes. Per recent lab results, elevated A1c (6.2) and triglycerides (187).   Pt expectations: wants to know foods to eat, wants to know how food affects her body if she eats too much of it  Pt states diabetes runs in her family; mother and brother have it.   Pt arrives stating she has been having a hard time remembering lately. Pt states she left ice cream on countertop until it melted and had to clean up, forgot potatoes in cabinet until it was leaking on floor, left groceries in the car for days, and left bag on top of her car. Pt states this has been going on for 1-2 months and increasing lately. Pt states she thinks it is tiredness from her job.   Pt states her blood pressure was good last Thurs but not on Friday because she eats fast food on Fridays. Pt states she hasn't been to McDonald's since previous visit; still frequents  [employer] and [employer]. Pt states she has been eating more fruit;  loves apples.   Pt states she is process of getting ready to retire. Pt states she works Mon-Fri 4pm-12am. Pt states she does not have local family; mom and brother live in [place].   Preferred Learning Style:   No preference indicated   Learning Readiness:   Not ready  Contemplating  Ready  Change in progress   MEDICATIONS: See list   DIETARY INTAKE:  Usual eating pattern includes 3 meals and 2-3 snacks per day.  Everyday foods include fast food, fried fish, spinach, potatoes, or pizza.  Avoided foods include collard/turnip greens, chocolate, peppermint.    24-hr recall:  B ( AM): cereal + banana or eggs + sausage + toast  Snk ( AM): none  L ( PM): spaghetti + salad or fast food-1/2 sandwich + fries or 1-2 slices of pizza Snk ( PM): apples D ( PM): steak + potatoes +  broccoli or chicken tenders + cream of chicken soup + green beans + potatoes or Immunologist or [employer]-rotisserie chicken + spinach + potatoes Snk ( PM): popcorn or rice krispie treat Beverages: juice, water, 2% milk, slush   Usual physical activity: ADLs  Estimated energy needs: 1600 calories 180 g carbohydrates 120 g protein 44 g fat  Progress Towards Goal(s):  In progress.   Nutritional Diagnosis:  NI-5.8.5 Inadeqate fiber intake As related to less than optimal food-preparation practices.  As evidenced by dietary recall of reliance on fast food and convenience foods.    Intervention:  Nutrition education and counseling. Pt was educated on the importance of contacting her primary care provider to further investigate memory concerns and ways to eat balanced meals at fast food restaurants. Pt was in agreement with goals listed.  Goals: - Contact your primary care provider this week for appointment related to memory.  - Try to have salad with protein and fruit as fast food option on Fridays. - Call me to update about memory and to possibly schedule next appointment.   Teaching Method Utilized:  Visual Auditory Hands on  Handouts given during visit include:  none  Barriers to learning/adherence to lifestyle change: Pt reports memory concerns   Demonstrated degree of understanding via:  Teach Back   Monitoring/Evaluation:  Dietary intake, exercise, and body weight prn. RD  will follow-up with primary care provider as well to contact patient for further evaluation.

## 2018-03-14 NOTE — Telephone Encounter (Signed)
Spoke with pt- pt is referring to the Anusol supp. Being called in for her;

## 2018-03-15 MED ORDER — HYDROCORTISONE ACETATE 25 MG RE SUPP: 25.0000 mg | Freq: Two times a day (BID) | RECTAL | 1 refills | Status: DC

## 2018-03-15 NOTE — Telephone Encounter (Signed)
Spoke with pt and she is aware, script sent to pharmacy. 

## 2018-03-15 NOTE — Telephone Encounter (Signed)
ok 

## 2018-03-19 NOTE — Progress Notes (Signed)
Subjective:    Patient ID: Karen Evans, female    DOB: 05-Jun-1950, 67 y.o.   MRN: [id number]  HPI The patient is here for an acute visit.  Memory concerns:  She is concerned about her memory.  She forgets to do stuff.  One night she ate ice cream and left the container out on the counter.  She had a doggie bag and left it on the car all night.  She left groceries in her car before.  She has left food out and has not put it away.   She works at [employer].  She examines the cigarettes.  She has not had had any issues at work.   She does not forget to take her medicine.   She remembers person's first names.  She sometimes can't find things.  She sometimes walks into a room and can not remember why she is there.  She does not forget conversations with friends/family.   There has been some increased issues at work.  She is retiring the end of this year.  Overall, she denies depression, but has some mild depression at work.  She denies anxiety.    Medications and allergies reviewed with patient and updated if appropriate.  Patient Active Problem List   Diagnosis Date Noted  . Memory difficulties 03/20/2018  . Osteopenia 07/25/2017  . Joint pain 07/12/2017  . Blurry vision 01/21/2017  . Change in bowel habits 06/18/2016  . Rectal bleeding 04/27/2016  . Prediabetes 10/27/2015  . History of colonic polyps 10/12/2013  . Constipation 10/12/2013  . Dyspnea 07/23/2013  . Hyperlipidemia 03/29/2011  . GERD (gastroesophageal reflux disease) 02/18/2011  . 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 VITAMIN D DEFICIENCY 03/26/2010  . IRRITABLE BOWEL SYNDROME 04/25/2008  . HIATAL HERNIA 04/24/2008  . RHINITIS 03/14/2008  . History of uterine fibroid 12/29/2007  . Goiter 12/29/2007  . Carpal tunnel syndrome 12/29/2007  . Essential hypertension 12/29/2007  . ANEMIA-NOS 07/03/2006    Current Outpatient Medications on File Prior to Visit  Medication Sig Dispense Refill  .  acetaminophen (TYLENOL) 500 MG tablet Take 250-500 mg by mouth every 6 (six) hours as needed for headache (pain).    . ACIPHEX 20 MG tablet TAKE 1 TABLET DAILY 90 tablet 1  . atenolol (TENORMIN) 50 MG tablet TAKE 1 TABLET BY MOUTH EVERY MORNING 90 tablet 1  . Calcium Carbonate-Vitamin D (CALTRATE 600+D PO) Take 1 tablet by mouth daily.     . cholecalciferol (VITAMIN D) 1000 units tablet Take 1,000 Units by mouth daily.    . fexofenadine (ALLEGRA) 180 MG tablet Take 180 mg by mouth daily as needed for allergies.     . hydrocortisone (ANUSOL-HC) 25 MG suppository Place 1 suppository (25 mg total) rectally 2 (two) times daily. (Patient taking differently: Place 25 mg rectally 2 (two) times daily as needed for hemorrhoids or itching. ) 14 suppository 0  . hydrocortisone (ANUSOL-HC) 25 MG suppository Place 1 suppository (25 mg total) rectally every 12 (twelve) hours. 12 suppository 1  . hydrocortisone cream 1 % Apply 1 application topically 2 (two) times daily as needed for itching (rash).    . irbesartan (AVAPRO) 150 MG tablet TAKE 1 TABLET BY MOUTH ONCE DAILY 90 tablet 0  . Lactase (LACTAID PO) Take 1 tablet by mouth 2 (two) times daily as needed (stomach upset/ gas).     . Multiple Vitamin (MULTIVITAMIN WITH MINERALS) TABS tablet Take 1 tablet by mouth daily.    Marland Kitchen  RABEprazole (ACIPHEX) 20 MG tablet Take 1 tablet (20 mg total) by mouth daily. 90 tablet 3  . rosuvastatin (CRESTOR) 5 MG tablet TAKE 1 TABLET BY MOUTH ONCE DAILY 90 tablet 1   No current facility-administered medications on file prior to visit.     Past Medical History:  Diagnosis Date  . Allergy   . Anemia    PMH of   . Colon polyp   . Crohn's    ileitis/mild; Dr Sharlett Iles  . Duodenitis without mention of hemorrhage 2007   EGD  . Fibroid, uterine    Dr Garwin Brothers; G 0 P 0  . GERD (gastroesophageal reflux disease)   . Goiter    CTS,RUE > LUE  . Heart murmur   . Hiatal hernia 2002   EGD  . Hypertension   . Internal  hemorrhoids   . Rectal polyp 01/24/2012   TUBULAR ADENOMA  . Reflux esophagitis 2007   EGD  . Stricture and stenosis of esophagus 2007   EGD  . Tubular adenoma of colon   . Ulcer   . Vitamin D deficiency     Past Surgical History:  Procedure Laterality Date  . COLONOSCOPY     polyps;granular ileitis 2006;2007 normal, Dr Sharlett Iles  . HEMORRHOID SURGERY    . OPERATIVE HYSTEROSCOPY  08-05-04   Dr Garwin Brothers  . OVARIAN CYST REMOVAL    . POLYPECTOMY    . UPPER GASTROINTESTINAL ENDOSCOPY      Social History   Socioeconomic History  . Marital status: Single    Spouse name: Not on file  . Number of children: 0  . Years of education: Not on file  . Highest education level: Not on file  Occupational History  . Occupation: TESTER    Employer: [employer]  . Financial resource strain: Not on file  . Food insecurity:    Worry: Not on file    Inability: Not on file  . Transportation needs:    Medical: Not on file    Non-medical: Not on file  Tobacco Use  . Smoking status: Never Smoker  . Smokeless tobacco: Never Used  Substance and Sexual Activity  . Alcohol use: No  . Drug use: No  . Sexual activity: Not on file  Lifestyle  . Physical activity:    Days per week: Not on file    Minutes per session: Not on file  . Stress: Not on file  Relationships  . Social connections:    Talks on phone: Not on file    Gets together: Not on file    Attends religious service: Not on file    Active member of club or organization: Not on file    Attends meetings of clubs or organizations: Not on file    Relationship status: Not on file  Other Topics Concern  . Not on file  Social History Narrative  . Not on file    Family History  Problem Relation Age of Onset  . Lymphoma Father   . Diabetes Mother   . Hypertension Mother   . Heart disease Mother   . Penile cancer Paternal Grandfather   . Diabetes Brother        CAD; Dialysis  . Hypertension Brother   .  Heart attack Unknown        1/2 maternal aunts X 2  . Rectal cancer Paternal Grandmother   . Colon cancer Maternal Grandmother   . Cancer Other   . COPD Neg Hx   .  Esophageal cancer Neg Hx   . Stomach cancer Neg Hx   . Stroke Neg Hx     Review of Systems  Constitutional: Negative for chills and fever.  Neurological: Positive for dizziness, light-headedness and headaches (occasional).  Psychiatric/Behavioral: Positive for decreased concentration (sometimes). Negative for confusion, dysphoric mood (sometimes related to work only) and sleep disturbance. The patient is not nervous/anxious.        Objective:   Vitals:   03/20/18 1122  BP: (!) 144/86  Pulse: (!) 53  Resp: 16  Temp: 98.6 F (37 C)  SpO2: 98%   BP Readings from Last 3 Encounters:  03/20/18 (!) 144/86  01/24/18 124/78  09/27/17 132/84   Wt Readings from Last 3 Encounters:  03/20/18 155 lb (70.3 kg)  01/24/18 155 lb (70.3 kg)  09/27/17 152 lb (68.9 kg)   Body mass index is 27.46 kg/m.   Physical Exam    Constitutional: Appears well-developed and well-nourished. No distress.  HENT:  Head: Normocephalic and atraumatic.  Neck: Neck supple. No tracheal deviation present. No thyromegaly present.  No cervical lymphadenopathy Cardiovascular: Normal rate, regular rhythm and normal heart sounds.   No murmur heard. No carotid bruit .  No edema Pulmonary/Chest: Effort normal and breath sounds normal. No respiratory distress. No has no wheezes. No rales.  Skin: Skin is warm and dry. Not diaphoretic.  Psychiatric: Normal mood and affect. Behavior is normal.       Assessment & Plan:    See Problem List for Assessment and Plan of chronic medical problems.

## 2018-03-20 ENCOUNTER — Encounter: Payer: Self-pay | Admitting: Neurology

## 2018-03-20 ENCOUNTER — Encounter: Payer: Self-pay | Admitting: Internal Medicine

## 2018-03-20 ENCOUNTER — Other Ambulatory Visit (INDEPENDENT_AMBULATORY_CARE_PROVIDER_SITE_OTHER): Payer: 59

## 2018-03-20 ENCOUNTER — Ambulatory Visit: Payer: 59 | Admitting: Internal Medicine

## 2018-03-20 DIAGNOSIS — R413 Other amnesia: Secondary | ICD-10-CM

## 2018-03-20 DIAGNOSIS — G3184 Mild cognitive impairment, so stated: Secondary | ICD-10-CM | POA: Insufficient documentation

## 2018-03-20 LAB — VITAMIN B12: Vitamin B-12: 1031 pg/mL — ABNORMAL HIGH (ref 211–911)

## 2018-03-20 LAB — TSH: TSH: 0.41 u[IU]/mL (ref 0.35–4.50)

## 2018-03-20 NOTE — Assessment & Plan Note (Signed)
I am not convinced she has a memory problem, but agree with further evaluation Check tsh, b12 Will hold off on imaging for now Discussed some causes of memory difficulties such as increased stress/depression, thyroid , B12 Discussed regular physical and mental exercise, good sleep and a healthy diet Will refer to neuro for further testing

## 2018-03-20 NOTE — Patient Instructions (Signed)
Have Blood work done.    Your results will be released to [place] (or called to you) after review, usually within 72hours after test completion. If any changes need to be made, you will be notified at that same time.   Medications reviewed and updated.  Changes include :   none   A referral was ordered for Neurology.

## 2018-03-23 ENCOUNTER — Ambulatory Visit (INDEPENDENT_AMBULATORY_CARE_PROVIDER_SITE_OTHER): Payer: 59

## 2018-03-23 DIAGNOSIS — Z299 Encounter for prophylactic measures, unspecified: Secondary | ICD-10-CM

## 2018-03-23 DIAGNOSIS — Z23 Encounter for immunization: Secondary | ICD-10-CM | POA: Diagnosis not present

## 2018-04-03 ENCOUNTER — Other Ambulatory Visit: Payer: Self-pay | Admitting: Internal Medicine

## 2018-04-03 LAB — HM MAMMOGRAPHY

## 2018-04-07 ENCOUNTER — Encounter: Payer: Self-pay | Admitting: Internal Medicine

## 2018-04-10 ENCOUNTER — Other Ambulatory Visit: Payer: Self-pay | Admitting: Internal Medicine

## 2018-05-01 ENCOUNTER — Ambulatory Visit: Payer: Self-pay | Admitting: *Deleted

## 2018-05-01 NOTE — Telephone Encounter (Signed)
LVM for pt to call back in regards.  

## 2018-05-01 NOTE — Telephone Encounter (Signed)
I do recommend the flu vaccine - I do not think the swelling she had last year was related the flu vaccine

## 2018-05-01 NOTE — Telephone Encounter (Signed)
No triage done.Patient called with questions regarding the flu vaccine and stated she had a reaction last season after receiving the vaccine. With questioning, she reveals it was several months after receiving the flu vaccine that she had face and lip swelling and throat tightening. Stated she "was told to see the eye Dr.". Poor historian and no record of a systemic reaction to the flu vaccine. She is asking if she should get the vaccine again this season. Upon reviewing recent OV, it is noted she does have memory difficulties. Her allergy list does not reflect an allergy to the flu vaccine.  Referring her question to PCP-Whether she should receive the flu vaccine. Routed to practice for advice.  Answer Assessment - Initial Assessment Questions 1. SYMPTOMS: "What is the main symptom?" (e.g., redness, swelling, pain)     Throat swelling, face and lips swollen 2. ONSET: "When was the vaccine (shot) given?" "How much later did the swelling begin?" (e.g., hours, days ago)       3. SEVERITY: "How bad is it?"      unsure 4. FEVER: "Is there a fever?" If so, ask: "What is it, how was it measured, and when did it start?"      no 5. IMMUNIZATIONS GIVEN: "What shots have you recently received?"      6. PAST REACTIONS: "Have you reacted to immunizations before?" If so, ask: "What happened?"      7. OTHER SYMPTOMS: "Do you have any other symptoms?"  Protocols used: IMMUNIZATION REACTIONS-A-AH

## 2018-05-02 NOTE — Telephone Encounter (Signed)
Pt advised of response below.

## 2018-06-02 ENCOUNTER — Other Ambulatory Visit: Payer: Self-pay | Admitting: Internal Medicine

## 2018-06-05 ENCOUNTER — Other Ambulatory Visit: Payer: Self-pay

## 2018-06-07 ENCOUNTER — Other Ambulatory Visit: Payer: Self-pay

## 2018-06-07 ENCOUNTER — Encounter: Payer: Self-pay | Admitting: Neurology

## 2018-06-07 ENCOUNTER — Ambulatory Visit: Payer: Medicare Other | Admitting: Neurology

## 2018-06-07 ENCOUNTER — Encounter

## 2018-06-07 VITALS — BP 108/70 | HR 61 | Ht 63.0 in | Wt 154.0 lb

## 2018-06-07 DIAGNOSIS — R413 Other amnesia: Secondary | ICD-10-CM

## 2018-06-07 DIAGNOSIS — G3184 Mild cognitive impairment, so stated: Secondary | ICD-10-CM | POA: Diagnosis not present

## 2018-06-07 NOTE — Patient Instructions (Addendum)
1. Schedule MRI brain without contrast  We have sent a referral to [facility] for your MRI and they will call you directly to schedule your appt. They are located at [place]. If you need to contact them directly please call [phone].  2. Recommend appointing a medical power of attorney 3. Follow-up in 6 months or so, call for any changes   RECOMMENDATIONS FOR ALL PATIENTS WITH MEMORY PROBLEMS: 1. Continue to exercise (Recommend 30 minutes of walking everyday, or 3 hours every week) 2. Increase social interactions - continue going to [place] and enjoy social gatherings with friends and family 3. Eat healthy, avoid fried foods and eat more fruits and vegetables 4. Maintain adequate blood pressure, blood sugar, and blood cholesterol level. Reducing the risk of stroke and cardiovascular disease also helps promoting better memory. 5. Avoid stressful situations. Live a simple life and avoid aggravations. Organize your time and prepare for the next day in anticipation. 6. Sleep well, avoid any interruptions of sleep and avoid any distractions in the bedroom that may interfere with adequate sleep quality 7. Avoid sugar, avoid sweets as there is a strong link between excessive sugar intake, diabetes, and cognitive impairment We discussed the Mediterranean diet, which has been shown to help patients reduce the risk of progressive memory disorders and reduces cardiovascular risk. This includes eating fish, eat fruits and green leafy vegetables, nuts like almonds and hazelnuts, walnuts, and also use olive oil. Avoid fast foods and fried foods as much as possible. Avoid sweets and sugar as sugar use has been linked to worsening of memory function.

## 2018-06-07 NOTE — Progress Notes (Signed)
NEUROLOGY CONSULTATION NOTE  Karen Evans MRN: [id number] DOB: [birth year]  Referring provider: Dr. Billey Gosling Primary care provider: Dr. Billey Gosling  Reason for consult:  Memory changes  Dear Dr Quay Burow:  Thank you for your kind referral of Karen Evans for consultation of the above symptoms. Although her history is well known to you, please allow me to reiterate it for the purpose of our medical record. She is alone in the office today. Records and images were personally reviewed where available.  HISTORY OF PRESENT ILLNESS: This is a 68 year old right-handed woman with a history of hypertension, hyperlipidemia, presenting for evaluation of memory concerns. She reports that she started noticing memory changes "when they started letting people go at the plant in 2016." She noticed she was forgetting things. She reported symptoms to Dr. Quay Burow in November 2019 where she would leave food out without putting it away, one time she left ice cream container out after eating it, and left a doggie bag or groceries in her car. She has burnt food she forgot was cooking and now has to make herself stay in the kitchen. She lost her keys one time and has started putting them in one place. She denies getting lost driving. No missed bills or medications. 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 She lives alone, her family (mother and brother) live in [place]. She has told them of her memory concerns, "they don't think I need to come here." Her maternal aunt had memory issues. No history of concussions or alcohol use.  She has occasional right hand and left finger tingling. She denies any headaches, dizziness, diplopia, dysarthria/dysphagia, neck/back pain, bowel/bladder dysfunction, anosmia, or tremors. No falls. Sleep is good. Mood "I think it's good." There is no family to corroborate history but there does not appear to be  any paranoia or hallucinations.   Laboratory Data: Lab Results  Component Value Date   TSH 0.41 03/20/2018   Lab Results  Component Value Date   VITAMINB12 1,031 (H) 03/20/2018     PAST MEDICAL HISTORY: Past Medical History:  Diagnosis Date  . Allergy   . Anemia    PMH of   . Colon polyp   . Crohn's    ileitis/mild; Dr Sharlett Iles  . Duodenitis without mention of hemorrhage 2007   EGD  . Fibroid, uterine    Dr Garwin Brothers; G 0 P 0  . GERD (gastroesophageal reflux disease)   . Goiter    CTS,RUE > LUE  . Heart murmur   . Hiatal hernia 2002   EGD  . Hypertension   . Internal hemorrhoids   . Rectal polyp 01/24/2012   TUBULAR ADENOMA  . Reflux esophagitis 2007   EGD  . Stricture and stenosis of esophagus 2007   EGD  . Tubular adenoma of colon   . Ulcer   . Vitamin D deficiency     PAST SURGICAL HISTORY: Past Surgical History:  Procedure Laterality Date  . COLONOSCOPY     polyps;granular ileitis 2006;2007 normal, Dr Sharlett Iles  . HEMORRHOID SURGERY    . OPERATIVE HYSTEROSCOPY  08-05-04   Dr Garwin Brothers  . OVARIAN CYST REMOVAL    . POLYPECTOMY    . UPPER GASTROINTESTINAL ENDOSCOPY      MEDICATIONS: Current Outpatient Medications on File Prior to Visit  Medication Sig Dispense Refill  . acetaminophen (TYLENOL) 500 MG tablet Take 250-500 mg by  mouth every 6 (six) hours as needed for headache (pain).    . ACIPHEX 20 MG tablet TAKE 1 TABLET DAILY 90 tablet 1  . atenolol (TENORMIN) 50 MG tablet TAKE 1 TABLET BY MOUTH ONCE DAILY IN THE MORNING 90 tablet 1  . Calcium Carbonate-Vitamin D (CALTRATE 600+D PO) Take 1 tablet by mouth daily.     . cholecalciferol (VITAMIN D) 1000 units tablet Take 1,000 Units by mouth daily.    . fexofenadine (ALLEGRA) 180 MG tablet Take 180 mg by mouth daily as needed for allergies.     . hydrocortisone (ANUSOL-HC) 25 MG suppository Place 1 suppository (25 mg total) rectally 2 (two) times daily. (Patient taking differently: Place 25 mg rectally 2  (two) times daily as needed for hemorrhoids or itching. ) 14 suppository 0  . hydrocortisone (ANUSOL-HC) 25 MG suppository Place 1 suppository (25 mg total) rectally every 12 (twelve) hours. 12 suppository 1  . hydrocortisone cream 1 % Apply 1 application topically 2 (two) times daily as needed for itching (rash).    . irbesartan (AVAPRO) 150 MG tablet TAKE 1 TABLET BY MOUTH ONCE DAILY 90 tablet 0  . Lactase (LACTAID PO) Take 1 tablet by mouth 2 (two) times daily as needed (stomach upset/ gas).     Marland Kitchen levofloxacin (LEVAQUIN) 500 MG tablet levofloxacin 500 mg tablet    . Multiple Vitamin (MULTIVITAMIN WITH MINERALS) TABS tablet Take 1 tablet by mouth daily.    . RABEprazole (ACIPHEX) 20 MG tablet Take 1 tablet (20 mg total) by mouth daily. 90 tablet 3  . rosuvastatin (CRESTOR) 5 MG tablet TAKE 1 TABLET BY MOUTH ONCE DAILY 90 tablet 0   No current facility-administered medications on file prior to visit.     ALLERGIES: Allergies  Allergen Reactions  . Cefprozil Hives  . Erythromycin Hives  . Esomeprazole Magnesium Hives  . Penicillins Hives and Rash    Has patient had a PCN reaction causing immediate rash, facial/tongue/throat swelling, SOB or lightheadedness with hypotension: Yes Has patient had a PCN reaction causing severe rash involving mucus membranes or skin necrosis: No Has patient had a PCN reaction that required hospitalization: No Has patient had a PCN reaction occurring within the last 10 years: No If all of the above answers are "NO", then may proceed with Cephalosporin use.  . Sulfonamide Derivatives     hives  . Amlodipine Other (See Comments)    gerd  . Crestor [Rosuvastatin Calcium]     itching  . Erythromycin Base   . Hydrochlorothiazide Other (See Comments)    lightheadedness  . Pravastatin     Dizzy, muscle soreness  . Sulfa Antibiotics   . Atorvastatin Other (See Comments)    Eyes itch  . Doxycycline Nausea And Vomiting    FAMILY HISTORY: Family History    Problem Relation Age of Onset  . Lymphoma Father   . Diabetes Mother   . Hypertension Mother   . Heart disease Mother   . Penile cancer Paternal Grandfather   . Diabetes Brother        CAD; Dialysis  . Hypertension Brother   . Heart attack Unknown        1/2 maternal aunts X 2  . Rectal cancer Paternal Grandmother   . Colon cancer Maternal Grandmother   . Cancer Other   . COPD Neg Hx   . Esophageal cancer Neg Hx   . Stomach cancer Neg Hx   . Stroke Neg Hx     SOCIAL  HISTORY: Social History   Socioeconomic History  . Marital status: Single    Spouse name: Not on file  . Number of children: 0  . Years of education: Not on file  . Highest education level: Not on file  Occupational History  . Occupation: TESTER    Employer: [employer]  . Financial resource strain: Not on file  . Food insecurity:    Worry: Not on file    Inability: Not on file  . Transportation needs:    Medical: Not on file    Non-medical: Not on file  Tobacco Use  . Smoking status: Never Smoker  . Smokeless tobacco: Never Used  Substance and Sexual Activity  . Alcohol use: No  . Drug use: No  . Sexual activity: Not on file  Lifestyle  . Physical activity:    Days per week: Not on file    Minutes per session: Not on file  . Stress: Not on file  Relationships  . Social connections:    Talks on phone: Not on file    Gets together: Not on file    Attends religious service: Not on file    Active member of club or organization: Not on file    Attends meetings of clubs or organizations: Not on file    Relationship status: Not on file  . Intimate partner violence:    Fear of current or ex partner: Not on file    Emotionally abused: Not on file    Physically abused: Not on file    Forced sexual activity: Not on file  Other Topics Concern  . Not on file  Social History Narrative  . Not on file    REVIEW OF SYSTEMS: Constitutional: No fevers, chills, or sweats, no  generalized fatigue, change in appetite Eyes: No visual changes, double vision, eye pain Ear, nose and throat: No hearing loss, ear pain, nasal congestion, sore throat Cardiovascular: No chest pain, palpitations Respiratory:  No shortness of breath at rest or with exertion, wheezes GastrointestinaI: No nausea, vomiting, diarrhea, abdominal pain, fecal incontinence Genitourinary:  No dysuria, urinary retention or frequency Musculoskeletal:  No neck pain, back pain Integumentary: No rash, pruritus, skin lesions Neurological: as above Psychiatric: No depression, insomnia, anxiety Endocrine: No palpitations, fatigue, diaphoresis, mood swings, change in appetite, change in weight, increased thirst Hematologic/Lymphatic:  No anemia, purpura, petechiae. Allergic/Immunologic: no itchy/runny eyes, nasal congestion, recent allergic reactions, rashes  PHYSICAL EXAM: Vitals:   06/07/18 0931  BP: 108/70  Pulse: 61  SpO2: 97%   General: No acute distress Head:  Normocephalic/atraumatic Eyes: Fundoscopic exam shows bilateral sharp discs, no vessel changes, exudates, or hemorrhages Neck: supple, no paraspinal tenderness, full range of motion Back: No paraspinal tenderness Heart: regular rate and rhythm Lungs: Clear to auscultation bilaterally. Vascular: No carotid bruits. Skin/Extremities: No rash, no edema Neurological Exam: Mental status: alert and oriented to person, place, and time, no dysarthria or aphasia, Fund of knowledge is appropriate.  Recent and remote memory are intact.  Attention and concentration are normal.    Able to name objects and repeat phrases.  Montreal Cognitive Assessment  06/07/2018  Visuospatial/ Executive (0/5) 4  Naming (0/3) 3  Attention: Read list of digits (0/2) 2  Attention: Read list of letters (0/1) 1  Attention: Serial 7 subtraction starting at 100 (0/3) 0  Language: Repeat phrase (0/2) 2  Language : Fluency (0/1) 0  Abstraction (0/2) 0  Delayed Recall  (0/5) 3  Orientation (0/6)  6  Total 21   Cranial nerves: CN I: not tested CN II: pupils equal, round and reactive to light, visual fields intact, fundi unremarkable. CN III, IV, VI:  full range of motion, no nystagmus, no ptosis CN V: facial sensation intact CN VII: upper and lower face symmetric CN VIII: hearing intact to finger rub CN IX, X: gag intact, uvula midline CN XI: sternocleidomastoid and trapezius muscles intact CN XII: tongue midline Bulk & Tone: normal, no fasciculations. Motor: 5/5 throughout with no pronator drift. Sensation: intact to light touch, cold, pin, vibration and joint position sense.  No extinction to double simultaneous stimulation.  Romberg test negative Deep Tendon Reflexes: +2 throughout, no ankle clonus Plantar responses: downgoing bilaterally Cerebellar: no incoordination on finger to nose, heel to shin. No dysdiadochokinesia Gait: narrow-based and steady, able to tandem walk adequately. Tremor: none  IMPRESSION: This is a 68 year old right-handed woman with a history of hypertension, hyperlipidemia, presenting for evaluation of memory concerns. 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 She is alone in the office, there is no family to corroborate history, but she denies any difficulties with complex tasks. We discussed different causes of memory loss, MRI brain without contrast will be ordered to assess for underlying structural abnormality. We discussed advanced care planning and appointing a HCPOA if her condition changes in the future. At the end of the visit, she seemed more confused about this visit as we discussed doing the MRI, she wanted me to ask her PCP if she should have it done. She also started showing me the card for her dietician. We discussed follow-up in 6 months to continue to monitor memory. We discussed the importance of control of vascular risk factors, physical exercise, and brain  stimulation exercises for brain health. Follow-up in 6 months.  Thank you for allowing me to participate in the care of this patient. Please do not hesitate to call for any questions or concerns.   Ellouise Newer, M.D.  CC: Dr. Quay Burow

## 2018-06-29 ENCOUNTER — Ambulatory Visit
Admission: RE | Admit: 2018-06-29 | Discharge: 2018-06-29 | Disposition: A | Discharge status: Home / Self Care | Payer: Medicare Other | Source: Ambulatory Visit | Attending: Neurology | Admitting: Neurology

## 2018-06-29 DIAGNOSIS — R413 Other amnesia: Secondary | ICD-10-CM

## 2018-07-06 ENCOUNTER — Other Ambulatory Visit: Payer: Self-pay | Admitting: Internal Medicine

## 2018-07-10 ENCOUNTER — Other Ambulatory Visit: Payer: Self-pay | Admitting: Internal Medicine

## 2018-07-24 NOTE — Progress Notes (Signed)
Subjective:    Patient ID: Karen Evans, female    DOB: Oct 19, 1950, 68 y.o.   MRN: [id number]  HPI The patient is here for follow up.    Medications and allergies reviewed with patient and updated if appropriate.  Patient Active Problem List   Diagnosis Date Noted  . Memory difficulties 03/20/2018  . Osteopenia 07/25/2017  . Joint pain 07/12/2017  . Blurry vision 01/21/2017  . Change in bowel habits 06/18/2016  . Rectal bleeding 04/27/2016  . Prediabetes 10/27/2015  . History of colonic polyps 10/12/2013  . Constipation 10/12/2013  . Dyspnea 07/23/2013  . Hyperlipidemia 03/29/2011  . GERD (gastroesophageal reflux disease) 02/18/2011  . 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 VITAMIN D DEFICIENCY 03/26/2010  . IRRITABLE BOWEL SYNDROME 04/25/2008  . HIATAL HERNIA 04/24/2008  . RHINITIS 03/14/2008  . History of uterine fibroid 12/29/2007  . Goiter 12/29/2007  . Carpal tunnel syndrome 12/29/2007  . Essential hypertension 12/29/2007  . ANEMIA-NOS 07/03/2006    Current Outpatient Medications on File Prior to Visit  Medication Sig Dispense Refill  . acetaminophen (TYLENOL) 500 MG tablet Take 250-500 mg by mouth every 6 (six) hours as needed for headache (pain).    . ACIPHEX 20 MG tablet TAKE 1 TABLET DAILY 90 tablet 1  . atenolol (TENORMIN) 50 MG tablet TAKE 1 TABLET BY MOUTH ONCE DAILY IN THE MORNING 90 tablet 1  . Calcium Carbonate-Vitamin D (CALTRATE 600+D PO) Take 1 tablet by mouth daily.     . cholecalciferol (VITAMIN D) 1000 units tablet Take 1,000 Units by mouth daily.    . fexofenadine (ALLEGRA) 180 MG tablet Take 180 mg by mouth daily as needed for allergies.     . hydrocortisone (ANUSOL-HC) 25 MG suppository Place 1 suppository (25 mg total) rectally every 12 (twelve) hours. 12 suppository 1  . hydrocortisone cream 1 % Apply 1 application topically 2 (two) times daily as needed for itching (rash).    . irbesartan (AVAPRO) 150 MG tablet TAKE 1 TABLET BY  MOUTH ONCE DAILY 90 tablet 1  . Lactase (LACTAID PO) Take 1 tablet by mouth 2 (two) times daily as needed (stomach upset/ gas).     Marland Kitchen levofloxacin (LEVAQUIN) 500 MG tablet levofloxacin 500 mg tablet    . Multiple Vitamin (MULTIVITAMIN WITH MINERALS) TABS tablet Take 1 tablet by mouth daily.    . RABEprazole (ACIPHEX) 20 MG tablet Take 1 tablet (20 mg total) by mouth daily. 90 tablet 3  . rosuvastatin (CRESTOR) 5 MG tablet TAKE 1 TABLET BY MOUTH ONCE DAILY 90 tablet 0   No current facility-administered medications on file prior to visit.     Past Medical History:  Diagnosis Date  . Allergy   . Anemia    PMH of   . Colon polyp   . Crohn's    ileitis/mild; Dr Sharlett Iles  . Duodenitis without mention of hemorrhage 2007   EGD  . Fibroid, uterine    Dr Garwin Brothers; G 0 P 0  . GERD (gastroesophageal reflux disease)   . Goiter    CTS,RUE > LUE  . Heart murmur   . Hiatal hernia 2002   EGD  . Hypertension   . Internal hemorrhoids   . Rectal polyp 01/24/2012   TUBULAR ADENOMA  . Reflux esophagitis 2007   EGD  . Stricture and stenosis of esophagus 2007   EGD  . Tubular adenoma of colon   . Ulcer   . Vitamin D deficiency  Past Surgical History:  Procedure Laterality Date  . COLONOSCOPY     polyps;granular ileitis 2006;2007 normal, Dr Sharlett Iles  . HEMORRHOID SURGERY    . OPERATIVE HYSTEROSCOPY  08-05-04   Dr Garwin Brothers  . OVARIAN CYST REMOVAL    . POLYPECTOMY    . UPPER GASTROINTESTINAL ENDOSCOPY      Social History   Socioeconomic History  . Marital status: Single    Spouse name: Not on file  . Number of children: 0  . Years of education: Not on file  . Highest education level: Not on file  Occupational History  . Occupation: TESTER    Employer: [employer]  . Financial resource strain: Not on file  . Food insecurity:    Worry: Not on file    Inability: Not on file  . Transportation needs:    Medical: Not on file    Non-medical: Not on file    Tobacco Use  . Smoking status: Never Smoker  . Smokeless tobacco: Never Used  Substance and Sexual Activity  . Alcohol use: No  . Drug use: No  . Sexual activity: Not on file  Lifestyle  . Physical activity:    Days per week: Not on file    Minutes per session: Not on file  . Stress: Not on file  Relationships  . Social connections:    Talks on phone: Not on file    Gets together: Not on file    Attends religious service: Not on file    Active member of club or organization: Not on file    Attends meetings of clubs or organizations: Not on file    Relationship status: Not on file  Other Topics Concern  . Not on file  Social History Narrative  . Not on file    Family History  Problem Relation Age of Onset  . Lymphoma Father   . Diabetes Mother   . Hypertension Mother   . Heart disease Mother   . Penile cancer Paternal Grandfather   . Diabetes Brother        CAD; Dialysis  . Hypertension Brother   . Heart attack Unknown        1/2 maternal aunts X 2  . Rectal cancer Paternal Grandmother   . Colon cancer Maternal Grandmother   . Cancer Other   . COPD Neg Hx   . Esophageal cancer Neg Hx   . Stomach cancer Neg Hx   . Stroke Neg Hx     Review of Systems     Objective:  There were no vitals filed for this visit. BP Readings from Last 3 Encounters:  06/07/18 108/70  03/20/18 (!) 144/86  01/24/18 124/78   Wt Readings from Last 3 Encounters:  06/07/18 154 lb (69.9 kg)  03/20/18 155 lb (70.3 kg)  01/24/18 155 lb (70.3 kg)   There is no height or weight on file to calculate BMI.   Physical Exam         Assessment & Plan:    See Problem List for Assessment and Plan of chronic medical problems.   This encounter was created in error - please disregard.

## 2018-07-24 NOTE — Patient Instructions (Signed)
  Tests ordered today. Your results will be released to MyChart (or called to you) after review, usually within 72hours after test completion. If any changes need to be made, you will be notified at that same time.  All other Health Maintenance issues reviewed.   All recommended immunizations and age-appropriate screenings are up-to-date or discussed.  No immunizations administered today.   Medications reviewed and updated.  Changes include :     Your prescription(s) have been submitted to your pharmacy. Please take as directed and contact our office if you believe you are having problem(s) with the medication(s).  A referral was ordered for   Please followup in    

## 2018-07-25 ENCOUNTER — Ambulatory Visit
Admission: RE | Admit: 2018-07-25 | Discharge: 2018-07-25 | Disposition: A | Discharge status: Home / Self Care | Payer: Medicare Other | Source: Ambulatory Visit | Attending: Neurology | Admitting: Neurology

## 2018-07-25 ENCOUNTER — Encounter: Payer: 59 | Admitting: Internal Medicine

## 2018-07-25 DIAGNOSIS — R413 Other amnesia: Secondary | ICD-10-CM | POA: Diagnosis not present

## 2018-07-25 DIAGNOSIS — H532 Diplopia: Secondary | ICD-10-CM | POA: Diagnosis not present

## 2018-07-27 ENCOUNTER — Telehealth: Payer: Self-pay

## 2018-07-27 ENCOUNTER — Other Ambulatory Visit: Payer: Medicare Other

## 2018-07-27 NOTE — Telephone Encounter (Signed)
Spoke with pt relaying results below 

## 2018-07-27 NOTE — Telephone Encounter (Signed)
-----   Message from Cameron Sprang, MD sent at 07/26/2018  9:17 AM EDT ----- Pls let her know MRI brain did not show any tumor, stroke, or bleed. It showed age-related changes and some hardening of the small blood vessels in the brain that is seen with people with blood pressure and cholesterol issues. Very important to control BP and cholesterol. Thanks

## 2018-08-07 DIAGNOSIS — G5601 Carpal tunnel syndrome, right upper limb: Secondary | ICD-10-CM | POA: Diagnosis not present

## 2018-08-14 DIAGNOSIS — G5601 Carpal tunnel syndrome, right upper limb: Secondary | ICD-10-CM | POA: Diagnosis not present

## 2018-08-25 ENCOUNTER — Other Ambulatory Visit: Payer: Self-pay | Admitting: Internal Medicine

## 2018-08-25 MED ORDER — IRBESARTAN 150 MG PO TABS: 150.0000 mg | ORAL_TABLET | Freq: Every day | ORAL | 0 refills | Status: DC

## 2018-08-25 MED ORDER — ROSUVASTATIN CALCIUM 5 MG PO TABS: 5.0000 mg | ORAL_TABLET | Freq: Every day | ORAL | 0 refills | Status: DC

## 2018-08-25 NOTE — Telephone Encounter (Signed)
[facility] pharmacy fax number for medications is [phone]. Patient has switched pharmacies

## 2018-08-25 NOTE — Telephone Encounter (Signed)
Requested Prescriptions  Pending Prescriptions Disp Refills  . rosuvastatin (CRESTOR) 5 MG tablet 30 tablet 0    Sig: Take 1 tablet (5 mg total) by mouth daily for 30 days.     Cardiovascular:  Antilipid - Statins Failed - 08/25/2018 11:47 AM      Failed - Triglycerides in normal range and within 360 days    Triglycerides  Date Value Ref Range Status  01/24/2018 187.0 (H) 0.0 - 149.0 mg/dL Final    Comment:    Normal:  <150 mg/dLBorderline High:  150 - 199 mg/dL         Passed - Total Cholesterol in normal range and within 360 days    Cholesterol  Date Value Ref Range Status  01/24/2018 174 0 - 200 mg/dL Final    Comment:    ATP III Classification       Desirable:  < 200 mg/dL               Borderline High:  200 - 239 mg/dL          High:  > = 240 mg/dL         Passed - LDL in normal range and within 360 days    LDL Cholesterol  Date Value Ref Range Status  01/24/2018 69 0 - 99 mg/dL Final         Passed - HDL in normal range and within 360 days    HDL  Date Value Ref Range Status  01/24/2018 67.30 >39.00 mg/dL Final         Passed - Patient is not pregnant      Passed - Valid encounter within last 12 months    Recent Outpatient Visits          1 month ago Essential hypertension   [place], Claudina Lick, MD   5 months ago Memory difficulties   [place], Claudina Lick, MD   7 months ago Essential hypertension   [place], Claudina Lick, MD   11 months ago Viral upper respiratory tract infection   [place], Claudina Lick, MD   1 year ago Essential hypertension   [facility], Claudina Lick, MD           . irbesartan (AVAPRO) 150 MG tablet 30 tablet 0    Sig: Take 1 tablet (150 mg total) by mouth daily.     Cardiovascular:  Angiotensin Receptor Blockers Failed - 08/25/2018 11:47 AM      Failed - Cr in normal range and  within 180 days    Creatinine, Ser  Date Value Ref Range Status  01/24/2018 0.92 0.40 - 1.20 mg/dL Final         Failed - K in normal range and within 180 days    Potassium  Date Value Ref Range Status  01/24/2018 4.0 3.5 - 5.1 mEq/L Final         Passed - Patient is not pregnant      Passed - Last BP in normal range    BP Readings from Last 1 Encounters:  06/07/18 108/70         Passed - Valid encounter within last 6 months    Recent Outpatient Visits          1 month ago Essential hypertension   [place], Claudina Lick, MD  5 months ago Memory difficulties   [place], Claudina Lick, MD   7 months ago Essential hypertension   [place], Claudina Lick, MD   11 months ago Viral upper respiratory tract infection   [place], Claudina Lick, MD   1 year ago Essential hypertension   [place], Claudina Lick, MD           . RABEprazole (ACIPHEX) 20 MG tablet 90 tablet 1    Sig: Take 1 tablet (20 mg total) by mouth daily.     Gastroenterology: Proton Pump Inhibitors Passed - 08/25/2018 11:47 AM      Passed - Valid encounter within last 12 months    Recent Outpatient Visits          1 month ago Essential hypertension   [place], Claudina Lick, MD   5 months ago Memory difficulties   [place], Claudina Lick, MD   7 months ago Essential hypertension   [place], Claudina Lick, MD   11 months ago Viral upper respiratory tract infection   [place], Claudina Lick, MD   1 year ago Essential hypertension   [facility], Claudina Lick, MD

## 2018-08-29 ENCOUNTER — Telehealth: Payer: Self-pay | Admitting: Internal Medicine

## 2018-08-29 MED ORDER — RABEPRAZOLE SODIUM 20 MG PO TBEC: 20.0000 mg | DELAYED_RELEASE_TABLET | Freq: Every day | ORAL | 3 refills | Status: DC

## 2018-08-29 NOTE — Telephone Encounter (Signed)
Rx sent 

## 2018-08-30 ENCOUNTER — Telehealth: Payer: Self-pay | Admitting: Internal Medicine

## 2018-08-30 MED ORDER — RABEPRAZOLE SODIUM 20 MG PO TBEC: 20.0000 mg | DELAYED_RELEASE_TABLET | Freq: Every day | ORAL | 3 refills | Status: DC

## 2018-08-30 NOTE — Telephone Encounter (Signed)
Rx sent 

## 2018-08-30 NOTE — Telephone Encounter (Signed)
Pt called and stated that the prescription needs to be sent to [facility] Address: [address], [place], [place] [address] Phone: [phone]

## 2018-08-30 NOTE — Telephone Encounter (Signed)
Wants name brand Aciphex That is not on her formulary - I discovered that after I talked to her but had already told her we would call her tomorrow or later about this

## 2018-08-31 MED ORDER — ACIPHEX 20 MG PO TBEC: 20.0000 mg | DELAYED_RELEASE_TABLET | Freq: Every day | ORAL | 3 refills | Status: DC

## 2018-08-31 NOTE — Telephone Encounter (Signed)
New rx for name brand aciphex sent to pharmacy. I have left a voicemail for patient indicating that we have sent name brand aciphex to her pharmacy and that she can call us back if she has questions.

## 2018-08-31 NOTE — Telephone Encounter (Signed)
Thanks CG  Dottie Can you look into this.  Can try brand name aciphex and see how much for her. Would sch virtual visit if not already in place

## 2018-08-31 NOTE — Telephone Encounter (Signed)
Prior authorization for name brand aciphex has been sent through [email or web address]

## 2018-09-04 NOTE — Telephone Encounter (Signed)
Patient's name brand aciphex has been approved through optumrx as a non-formulary exception through 05/17/19. Reference # [id number]. (Reviewed by [facility], pharm D.)

## 2018-09-06 ENCOUNTER — Other Ambulatory Visit: Payer: Self-pay

## 2018-09-06 MED ORDER — ATENOLOL 50 MG PO TABS: ORAL_TABLET | ORAL | 1 refills | Status: DC

## 2018-09-06 MED ORDER — IRBESARTAN 150 MG PO TABS: 150.0000 mg | ORAL_TABLET | Freq: Every day | ORAL | 1 refills | Status: DC

## 2018-09-06 MED ORDER — ROSUVASTATIN CALCIUM 5 MG PO TABS: 5.0000 mg | ORAL_TABLET | Freq: Every day | ORAL | 1 refills | Status: DC

## 2018-09-25 ENCOUNTER — Encounter: Payer: Self-pay | Admitting: Internal Medicine

## 2018-09-25 ENCOUNTER — Telehealth: Payer: Self-pay

## 2018-09-25 ENCOUNTER — Other Ambulatory Visit: Payer: Self-pay

## 2018-09-25 ENCOUNTER — Ambulatory Visit (INDEPENDENT_AMBULATORY_CARE_PROVIDER_SITE_OTHER): Payer: Medicare Other | Admitting: Internal Medicine

## 2018-09-25 DIAGNOSIS — T50905A Adverse effect of unspecified drugs, medicaments and biological substances, initial encounter: Secondary | ICD-10-CM | POA: Diagnosis not present

## 2018-09-25 DIAGNOSIS — T50905D Adverse effect of unspecified drugs, medicaments and biological substances, subsequent encounter: Secondary | ICD-10-CM | POA: Insufficient documentation

## 2018-09-25 NOTE — Assessment & Plan Note (Signed)
She was to be having a side effect to 1 of her medications-itching, or clear tingling sensation.  She also states lightheadedness and redness of the eyes, but I am not sure if that is part of the side effect or not Most likely either generic Crestor-she recently started pills from a different manufacturer and she thinks that is the cause AcipHex is also a new medication for her, but she does not feel that is the cause Discontinue Crestor and make sure that her symptoms completely resolve.  She has not tolerated a few statins and she may be intolerant to statins She is due for routine follow-up and we will schedule that in follow-up with her symptoms at that time

## 2018-09-25 NOTE — Telephone Encounter (Signed)
Pt set up for an in office appointment this afternoon at 3:30 pm.

## 2018-09-25 NOTE — Telephone Encounter (Signed)
Appointment has been changed to a doxy

## 2018-09-25 NOTE — Telephone Encounter (Signed)
It is ok to take those meds together.    She may need an appt for the hives

## 2018-09-25 NOTE — Progress Notes (Signed)
Virtual Visit via Telephone Note, failed video visit  I connected with Karen Evans on 09/25/18 at  3:30 PM EDT by telephone and verified that I am speaking with the correct person using two identifiers.   I discussed the limitations of evaluation and management by telemedicine and the availability of in person appointments. The patient expressed understanding and agreed to proceed.  The patient is currently at home and I am in the office.  Only her and I were present during the call.  No referring provider.    History of Present Illness: This is an acute visit for  She takes generic Crestor in the middle of the day with the AcipHex..   She thinks she is having a side effect from the Crestor.  She states the manufacturer changed and that is the problem.  A little while after she takes it she starts to feel lightheaded, her eyes get red and toward the evening before she goes to bed she has a itching or prickly sensation all over her body.  It feels like she is going to get hives, but she denies rash or hives that are visible.  She states she does not feel well.  She has not tolerated several statins in the past.      The AcipHex is also relatively new, but she does not feel that is the problem.    Review of Systems  Constitutional: Negative for chills and fever.  Eyes: Positive for redness.  Respiratory: Negative for cough, shortness of breath and wheezing.   Cardiovascular: Positive for PND. Negative for chest pain and palpitations.  Gastrointestinal: Positive for nausea.  Skin: Positive for itching. Negative for rash.  Neurological: Negative for headaches.       Lightheadedness      Social History   Socioeconomic History  . Marital status: Single    Spouse name: Not on file  . Number of children: 0  . Years of education: Not on file  . Highest education level: Not on file  Occupational History  . Occupation: TESTER    Employer: [employer]  .  Financial resource strain: Not on file  . Food insecurity:    Worry: Not on file    Inability: Not on file  . Transportation needs:    Medical: Not on file    Non-medical: Not on file  Tobacco Use  . Smoking status: Never Smoker  . Smokeless tobacco: Never Used  Substance and Sexual Activity  . Alcohol use: No  . Drug use: No  . Sexual activity: Not on file  Lifestyle  . Physical activity:    Days per week: Not on file    Minutes per session: Not on file  . Stress: Not on file  Relationships  . Social connections:    Talks on phone: Not on file    Gets together: Not on file    Attends religious service: Not on file    Active member of club or organization: Not on file    Attends meetings of clubs or organizations: Not on file    Relationship status: Not on file  Other Topics Concern  . Not on file  Social History Narrative  . Not on file      Assessment and Plan:  See Problem List for Assessment and Plan of chronic medical problems.   Follow Up Instructions:    I discussed the assessment and treatment plan with the patient. The patient was provided an  opportunity to ask questions and all were answered. The patient agreed with the plan and demonstrated an understanding of the instructions.   The patient was advised to call back or seek an in-person evaluation if the symptoms worsen or if the condition fails to improve as anticipated.    Binnie Rail, MD

## 2018-09-25 NOTE — Telephone Encounter (Signed)
Copied from [place] [id number]. Topic: General - Other >> Sep 25, 2018  9:52 AM Parke Poisson wrote: Reason for CRM: Pt wants to know if it is ok to take her rosuvastatin (CRESTOR) 5 MG tablet,atenolol (TENORMIN) 50 MG tablet & irbesartan (AVAPRO) 150 MG tablet along with her Calcium Carbonate-Vitamin D (CALTRATE 600+D PO. She has been getting hives and not able to sleep at night

## 2018-10-06 ENCOUNTER — Ambulatory Visit (INDEPENDENT_AMBULATORY_CARE_PROVIDER_SITE_OTHER): Payer: Medicare Other | Admitting: Internal Medicine

## 2018-10-06 ENCOUNTER — Encounter: Payer: Self-pay | Admitting: Internal Medicine

## 2018-10-06 ENCOUNTER — Other Ambulatory Visit: Payer: Self-pay

## 2018-10-06 VITALS — BP 142/84 | HR 60 | Temp 99.1°F | Resp 16 | Ht 63.0 in | Wt 156.1 lb

## 2018-10-06 DIAGNOSIS — I10 Essential (primary) hypertension: Secondary | ICD-10-CM | POA: Diagnosis not present

## 2018-10-06 DIAGNOSIS — H00014 Hordeolum externum left upper eyelid: Secondary | ICD-10-CM | POA: Diagnosis not present

## 2018-10-06 DIAGNOSIS — E7849 Other hyperlipidemia: Secondary | ICD-10-CM

## 2018-10-06 DIAGNOSIS — T50905D Adverse effect of unspecified drugs, medicaments and biological substances, subsequent encounter: Secondary | ICD-10-CM | POA: Diagnosis not present

## 2018-10-06 NOTE — Assessment & Plan Note (Signed)
Likely stye left upper eyelid-present for about 1 month No pain Advised gentle massaging and warm compresses If it does not resolve/improve advised her to make an appointment with her eye doctor

## 2018-10-06 NOTE — Progress Notes (Signed)
Subjective:    Patient ID: Karen Evans, female    DOB: [birth year], 68 y.o.   MRN: [id number]  HPI The patient is here for an acute visit.  She is not exercising regularly.     Medication reaction: We spoke via a virtual visit on 5/11 - she had lightheadedness, red eyes toward evening and itching/prickly sensation all over her body.  This started after she took the crestor and feels that was the cause.  She has stopped taking it and the symptoms are better, but not completely gone.  She has a feeling of hives in different places, but denies ever seeing a rash or hives.  She feels itchy but denies hives or rash.  The itching occurs in different areas throughout her body and comes and goes.    Bump on left upper eye lid:  She is unsure how long it has been there.  She thinks maybe a month or so.  It is not tender.  It is mild.  High cholesterol: She has not tolerated Crestor and that was discontinued.  Believes it caused some itching.  She has not tolerated statins in the past.  She is not currently exercising regularly.  She has been trying to lose weight.  Hypertension: She is taking her medication daily. She is compliant with a low sodium diet.  She denies chest pain, palpitations, edema and regular headaches.     Medications and allergies reviewed with patient and updated if appropriate.  Patient Active Problem List   Diagnosis Date Noted  . Medication side effect, initial encounter 09/25/2018  . Memory difficulties 03/20/2018  . Osteopenia 07/25/2017  . Joint pain 07/12/2017  . Blurry vision 01/21/2017  . Change in bowel habits 06/18/2016  . Rectal bleeding 04/27/2016  . Prediabetes 10/27/2015  . History of colonic polyps 10/12/2013  . Constipation 10/12/2013  . Dyspnea 07/23/2013  . Hyperlipidemia 03/29/2011  . GERD (gastroesophageal reflux disease) 02/18/2011  . 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 VITAMIN D DEFICIENCY 03/26/2010  . IRRITABLE BOWEL  SYNDROME 04/25/2008  . HIATAL HERNIA 04/24/2008  . RHINITIS 03/14/2008  . History of uterine fibroid 12/29/2007  . Goiter 12/29/2007  . Carpal tunnel syndrome 12/29/2007  . Essential hypertension 12/29/2007  . ANEMIA-NOS 07/03/2006    Current Outpatient Medications on File Prior to Visit  Medication Sig Dispense Refill  . acetaminophen (TYLENOL) 500 MG tablet Take 250-500 mg by mouth every 6 (six) hours as needed for headache (pain).    . ACIPHEX 20 MG tablet Take 1 tablet (20 mg total) by mouth daily. 90 tablet 3  . atenolol (TENORMIN) 50 MG tablet TAKE 1 TABLET BY MOUTH ONCE DAILY IN THE MORNING 90 tablet 1  . Calcium Carbonate-Vitamin D (CALTRATE 600+D PO) Take 1 tablet by mouth daily.     . cholecalciferol (VITAMIN D) 1000 units tablet Take 1,000 Units by mouth daily.    . irbesartan (AVAPRO) 150 MG tablet Take 1 tablet (150 mg total) by mouth daily. 90 tablet 1  . Lactase (LACTAID PO) Take 1 tablet by mouth 2 (two) times daily as needed (stomach upset/ gas).     . Multiple Vitamin (MULTIVITAMIN WITH MINERALS) TABS tablet Take 1 tablet by mouth daily.    . rosuvastatin (CRESTOR) 5 MG tablet Take 1 tablet (5 mg total) by mouth daily for 30 days. (Patient not taking: Reported on 10/06/2018) 90 tablet 1   No current facility-administered medications on file prior to visit.  Past Medical History:  Diagnosis Date  . Allergy   . Anemia    PMH of   . Colon polyp   . Crohn's    ileitis/mild; Dr Sharlett Iles  . Duodenitis without mention of hemorrhage 2007   EGD  . Fibroid, uterine    Dr Garwin Brothers; G 0 P 0  . GERD (gastroesophageal reflux disease)   . Goiter    CTS,RUE > LUE  . Heart murmur   . Hiatal hernia 2002   EGD  . Hypertension   . Internal hemorrhoids   . Rectal polyp 01/24/2012   TUBULAR ADENOMA  . Reflux esophagitis 2007   EGD  . Stricture and stenosis of esophagus 2007   EGD  . Tubular adenoma of colon   . Ulcer   . Vitamin D deficiency     Past Surgical  History:  Procedure Laterality Date  . COLONOSCOPY     polyps;granular ileitis 2006;2007 normal, Dr Sharlett Iles  . HEMORRHOID SURGERY    . OPERATIVE HYSTEROSCOPY  08-05-04   Dr Garwin Brothers  . OVARIAN CYST REMOVAL    . POLYPECTOMY    . UPPER GASTROINTESTINAL ENDOSCOPY      Social History   Socioeconomic History  . Marital status: Single    Spouse name: Not on file  . Number of children: 0  . Years of education: Not on file  . Highest education level: Not on file  Occupational History  . Occupation: TESTER    Employer: [employer]  . Financial resource strain: Not on file  . Food insecurity:    Worry: Not on file    Inability: Not on file  . Transportation needs:    Medical: Not on file    Non-medical: Not on file  Tobacco Use  . Smoking status: Never Smoker  . Smokeless tobacco: Never Used  Substance and Sexual Activity  . Alcohol use: No  . Drug use: No  . Sexual activity: Not on file  Lifestyle  . Physical activity:    Days per week: Not on file    Minutes per session: Not on file  . Stress: Not on file  Relationships  . Social connections:    Talks on phone: Not on file    Gets together: Not on file    Attends religious service: Not on file    Active member of club or organization: Not on file    Attends meetings of clubs or organizations: Not on file    Relationship status: Not on file  Other Topics Concern  . Not on file  Social History Narrative  . Not on file    Family History  Problem Relation Age of Onset  . Lymphoma Father   . Diabetes Mother   . Hypertension Mother   . Heart disease Mother   . Penile cancer Paternal Grandfather   . Diabetes Brother        CAD; Dialysis  . Hypertension Brother   . Heart attack Unknown        1/2 maternal aunts X 2  . Rectal cancer Paternal Grandmother   . Colon cancer Maternal Grandmother   . Cancer Other   . COPD Neg Hx   . Esophageal cancer Neg Hx   . Stomach cancer Neg Hx   . Stroke  Neg Hx     Review of Systems  Constitutional: Negative for chills and fever.  HENT: Negative for sore throat and trouble swallowing.   Respiratory: Positive for shortness  of breath (not new). Negative for cough and wheezing.   Cardiovascular: Negative for chest pain and palpitations.  Gastrointestinal:       GERD controlled  Skin: Negative for rash.       Itching in different areas of her body  Neurological: Negative for light-headedness and headaches.       Objective:   Vitals:   10/06/18 1524  BP: (!) 142/84  Pulse: 60  Resp: 16  Temp: 99.1 F (37.3 C)  SpO2: 99%   BP Readings from Last 3 Encounters:  10/06/18 (!) 142/84  06/07/18 108/70  03/20/18 (!) 144/86   Wt Readings from Last 3 Encounters:  10/06/18 156 lb 1.9 oz (70.8 kg)  06/07/18 154 lb (69.9 kg)  03/20/18 155 lb (70.3 kg)   Body mass index is 27.66 kg/m.   Physical Exam    Constitutional: Appears well-developed and well-nourished. No distress.  HENT:  Head: Normocephalic and atraumatic. Left upper eyelid with 2 cm nontender lump Neck: Neck supple. No tracheal deviation present. No thyromegaly present.  No cervical lymphadenopathy Cardiovascular: Normal rate, regular rhythm and normal heart sounds.   No murmur heard. No carotid bruit .  No edema Pulmonary/Chest: Effort normal and breath sounds normal. No respiratory distress. No has no wheezes. No rales.  Skin: Skin is warm and dry. Not diaphoretic. No Rash or hives.   Psychiatric: Normal mood and affect. Behavior is normal.       Assessment & Plan:    See Problem List for Assessment and Plan of chronic medical problems.

## 2018-10-06 NOTE — Assessment & Plan Note (Signed)
Most of her symptoms related to the reaction to Crestor have resolved, but still has some intermittent itching throughout her body-I am not completely convinced this is related to the Crestor, which at this point would be out of her system, but we will not restart any statin Advised antihistamine for the itching

## 2018-10-06 NOTE — Assessment & Plan Note (Signed)
Overall blood pressure well controlled Continue current medications at current doses

## 2018-10-06 NOTE — Assessment & Plan Note (Signed)
Somewhat statin intolerant-has not tolerated pravastatin, atorvastatin or Crestor For now we will hold off on starting a new medication Stressed the importance of regular exercise, weight loss and healthy diet She will follow-up in the fall-we will recheck her blood work at that time and consider trying Zetia

## 2018-10-06 NOTE — Patient Instructions (Addendum)
You can try taking Claritin or allegra for the itch.    Medications reviewed and updated.  Changes include :  none    If the stye does not go away see your eye doctor.    Please followup in September     Stye  [place], also known as a hordeolum, is a bump that forms on an eyelid. It may look like a pimple next to the eyelash. A stye can form inside the eyelid (internal stye) or outside the eyelid (external stye). A stye can cause redness, swelling, and pain on the eyelid. Styes are very common. Anyone can get them at any age. They usually occur in just one eye, but you may have more than one in either eye. What are the causes? A stye is caused by an infection. The infection is almost always caused by bacteria called Staphylococcus aureus. This is a common type of bacteria that lives on the skin. An internal stye may result from an infected oil-producing gland inside the eyelid. An external stye may be caused by an infection at the base of the eyelash (hair follicle). What increases the risk? You are more likely to develop a stye if:  You have had a stye before.  You have any of these conditions: ? Diabetes. ? Red, itchy, inflamed eyelids (blepharitis). ? A skin condition such as seborrheic dermatitis or rosacea. ? High fat levels in your blood (lipids). What are the signs or symptoms? The most common symptom of a stye is eyelid pain. Internal styes are more painful than external styes. Other symptoms may include:  Painful swelling of your eyelid.  A scratchy feeling in your eye.  Tearing and redness of your eye.  Pus draining from the stye. How is this diagnosed? Your health care provider may be able to diagnose a stye just by examining your eye. The health care provider may also check to make sure:  You do not have a fever or other signs of a more serious infection.  The infection has not spread to other parts of your eye or areas around your eye. How is this  treated? Most styes will clear up in a few days without treatment or with warm compresses applied to the area. You may need to use antibiotic drops or ointment to treat an infection. In some cases, if your stye does not heal with routine treatment, your health care provider may drain pus from the stye using a thin blade or needle. This may be done if the stye is large, causing a lot of pain, or affecting your vision. Follow these instructions at home:  Take over-the-counter and prescription medicines only as told by your health care provider. This includes eye drops or ointments.  If you were prescribed an antibiotic medicine, apply or use it as told by your health care provider. Do not stop using the antibiotic even if your condition improves.  Apply a warm, wet cloth (warm compress) to your eye for 5-10 minutes, 4 times a day.  Clean the affected eyelid as directed by your health care provider.  Do not wear contact lenses or eye makeup until your stye has healed.  Do not try to pop or drain the stye.  Do not rub your eye. Contact a health care provider if:  You have chills or a fever.  Your stye does not go away after several days.  Your stye affects your vision.  Your eyeball becomes swollen, red, or painful. Get help right  away if:  You have pain when moving your eye around. Summary  A stye is a bump that forms on an eyelid. It may look like a pimple next to the eyelash.  A stye can form inside the eyelid (internal stye) or outside the eyelid (external stye). A stye can cause redness, swelling, and pain on the eyelid.  Your health care provider may be able to diagnose a stye just by examining your eye.  Apply a warm, wet cloth (warm compress) to your eye for 5-10 minutes, 4 times a day. This information is not intended to replace advice given to you by your health care provider. Make sure you discuss any questions you have with your health care provider. Document Released:  02/10/2005 Document Revised: 01/13/2017 Document Reviewed: 01/13/2017 Elsevier Interactive Patient Education  2019 [employer].

## 2018-10-31 DIAGNOSIS — G5601 Carpal tunnel syndrome, right upper limb: Secondary | ICD-10-CM | POA: Diagnosis not present

## 2018-11-06 DIAGNOSIS — Z4789 Encounter for other orthopedic aftercare: Secondary | ICD-10-CM | POA: Insufficient documentation

## 2018-11-14 DIAGNOSIS — M79642 Pain in left hand: Secondary | ICD-10-CM | POA: Diagnosis not present

## 2018-11-30 DIAGNOSIS — M79641 Pain in right hand: Secondary | ICD-10-CM | POA: Diagnosis not present

## 2018-12-05 ENCOUNTER — Telehealth: Payer: Self-pay | Admitting: Internal Medicine

## 2018-12-05 NOTE — Telephone Encounter (Signed)
Pt needs refill of generic of aciphex sent to [facility] on [address].

## 2018-12-06 NOTE — Telephone Encounter (Signed)
Pt left message with answering service inquiring if this medication was sent to her pharmacy. She would like a call when it is taken care of.

## 2018-12-06 NOTE — Telephone Encounter (Signed)
Left message for patient to call back  

## 2018-12-06 NOTE — Telephone Encounter (Signed)
Patient is returning your call would like to speak to someone regarding her medication

## 2018-12-06 NOTE — Telephone Encounter (Addendum)
Left another message for patient to call back. On 08/31/18 We sent #[phone] with 3 refills to her pharmacy. See telephone encounters from 10/29/15,06/13/17, 08/30/18 just to name a few. There are multiple encounters where this patient requests name brand Aciphex then later advises that she needs generic and vice versa.  She has also requested multiple PPI's but has had allergic reactions or intolerances to nearly all of them. I will get a better idea when she calls back as to what she needs, but I am unsure what else I can do to help her if in relation to name brand Aciphex vs generic.

## 2018-12-06 NOTE — Telephone Encounter (Signed)
Patient indicates that she has recently started having itching on the "generic" Aciphex but indicates that she has just been "taking it anyways." She says that her pharmacy now tells her she does not have refills on this. Our chart notes indicate that patient should be on name brand Aciphex as this is what we authorized and what she requested back in April 2020. Unfortunately, patient is hard to follow in conversation and at one point tells me she is taking generic and then later tells me she paid for name brand. Therefore, I cannot be sure which one she is truly taking to know if it is the inactive ingredient in the generic that she has apparently had issue with in the past or if she has just become intolerant/allergic to name brand Aciphex as well.   At this point, patient has gone through almost all PPI's (see 08/29/17 telephone note.... "Patient has previously been tried and has had reaction or intolerance to zantac, omeprazole (hives), esomeprazole (hives), aciphex (at one time she told us it was ineffective but has since retried and has done well), lansoprazole, pantoprazole (joint pains)." ). Dr Hilarie Fredrickson recently requested patient have office visit anyways. Since we are running out of options to treat patient's GERD, I have suggested she bring her medications to our office and come see one of our PA's so that we can discuss. She is scheduled to see Alonza Bogus, PA-C on 12/07/18 at 11:00 am. Patient verbalizes understanding.

## 2018-12-07 ENCOUNTER — Encounter: Payer: Self-pay | Admitting: Gastroenterology

## 2018-12-07 ENCOUNTER — Ambulatory Visit (INDEPENDENT_AMBULATORY_CARE_PROVIDER_SITE_OTHER): Payer: Medicare Other | Admitting: Gastroenterology

## 2018-12-07 VITALS — BP 128/64 | HR 57 | Temp 98.5°F | Ht 63.0 in | Wt 157.0 lb

## 2018-12-07 DIAGNOSIS — K219 Gastro-esophageal reflux disease without esophagitis: Secondary | ICD-10-CM

## 2018-12-07 MED ORDER — RABEPRAZOLE SODIUM 10 MG PO CPSP: 20.0000 mg | ORAL_CAPSULE | Freq: Every day | ORAL | 3 refills | Status: DC

## 2018-12-07 NOTE — Patient Instructions (Addendum)
If you are age 66 or older, your body mass index should be between 23-30. Your Body mass index is 27.81 kg/m. If this is out of the aforementioned range listed, please consider follow up with your Primary Care Provider.  If you are age 66 or younger, your body mass index should be between 19-25. Your Body mass index is 27.81 kg/m. If this is out of the aformentioned range listed, please consider follow up with your Primary Care Provider.   Take Rabeprazole at bedtime to see if you sleep through itching side effect.  Can take Pepcid in the morning which can help with breakthrough reflux and may help with itching as well.  Follow up with Dr. Hilarie Fredrickson in September.  The schedule will be out in a couple of weeks.  Thank you for choosing me and [facility].   Alonza Bogus, PA-C

## 2018-12-07 NOTE — Progress Notes (Addendum)
12/07/2018 Karen Evans [id number] 09/03/1950   HISTORY OF PRESENT ILLNESS:  This is a 68 year old female who is a patient of Dr. Vena Rua.  She is here today to discuss issues with her acid reflux medications.  Patient is very hard to deal with due to inconsistencies and unclear history that she gives.  Terrible historian.  Reports itching with rabeprazole generic since she started in in April, but cannot afford Aciphex brand, which she has done well with in the past.  Has been on about every other PPI with some type of issue.  She says that she took the AcipHex once at nighttime and slept through the itching side effects.  Reports that Pepcid helps but only for very short period of time.    Past Medical History:  Diagnosis Date  . Allergy   . Anemia    PMH of   . Colon polyp   . Crohn's    ileitis/mild; Dr Sharlett Iles  . Duodenitis without mention of hemorrhage 2007   EGD  . Fibroid, uterine    Dr Garwin Brothers; G 0 P 0  . GERD (gastroesophageal reflux disease)   . Goiter    CTS,RUE > LUE  . Heart murmur   . Hiatal hernia 2002   EGD  . Hypertension   . Internal hemorrhoids   . Rectal polyp 01/24/2012   TUBULAR ADENOMA  . Reflux esophagitis 2007   EGD  . Stricture and stenosis of esophagus 2007   EGD  . Tubular adenoma of colon   . Ulcer   . Vitamin D deficiency    Past Surgical History:  Procedure Laterality Date  . COLONOSCOPY     polyps;granular ileitis 2006;2007 normal, Dr Sharlett Iles  . HEMORRHOID SURGERY    . OPERATIVE HYSTEROSCOPY  08-05-04   Dr Garwin Brothers  . OVARIAN CYST REMOVAL    . POLYPECTOMY    . UPPER GASTROINTESTINAL ENDOSCOPY      reports that she has never smoked. She has never used smokeless tobacco. She reports that she does not drink alcohol or use drugs. family history includes Cancer in an other family member; Colon cancer in her maternal grandmother; Diabetes in her brother and mother; Heart attack in her unknown relative; Heart disease in her  mother; Hypertension in her brother and mother; Lymphoma in her father; Penile cancer in her paternal grandfather; Rectal cancer in her paternal grandmother. Allergies  Allergen Reactions  . Cefprozil Hives  . Erythromycin Hives  . Esomeprazole Magnesium Hives  . Penicillins Hives and Rash    Has patient had a PCN reaction causing immediate rash, facial/tongue/throat swelling, SOB or lightheadedness with hypotension: Yes Has patient had a PCN reaction causing severe rash involving mucus membranes or skin necrosis: No Has patient had a PCN reaction that required hospitalization: No Has patient had a PCN reaction occurring within the last 10 years: No If all of the above answers are "NO", then may proceed with Cephalosporin use.  . Sulfonamide Derivatives     hives  . Amlodipine Other (See Comments)    gerd  . Crestor [Rosuvastatin Calcium]     itching  . Crestor [Rosuvastatin]     Lightheadedness, itchy, prickly all over  . Erythromycin Base   . Hydrochlorothiazide Other (See Comments)    lightheadedness  . Pravastatin     Dizzy, muscle soreness  . Sulfa Antibiotics   . Atorvastatin Other (See Comments)    Eyes itch  . Doxycycline Nausea And Vomiting  Outpatient Encounter Medications as of 12/07/2018  Medication Sig  . RABEprazole Sodium 10 MG CPSP Take 20 mg by mouth daily.  Marland Kitchen acetaminophen (TYLENOL) 500 MG tablet Take 250-500 mg by mouth every 6 (six) hours as needed for headache (pain).  . ACIPHEX 20 MG tablet Take 1 tablet (20 mg total) by mouth daily.  Marland Kitchen atenolol (TENORMIN) 50 MG tablet TAKE 1 TABLET BY MOUTH ONCE DAILY IN THE MORNING  . Calcium Carbonate-Vitamin D (CALTRATE 600+D PO) Take 1 tablet by mouth daily.   . cholecalciferol (VITAMIN D) 1000 units tablet Take 1,000 Units by mouth daily.  . irbesartan (AVAPRO) 150 MG tablet Take 1 tablet (150 mg total) by mouth daily.  . Lactase (LACTAID PO) Take 1 tablet by mouth 2 (two) times daily as needed (stomach upset/  gas).   . Multiple Vitamin (MULTIVITAMIN WITH MINERALS) TABS tablet Take 1 tablet by mouth daily.   No facility-administered encounter medications on file as of 12/07/2018.      REVIEW OF SYSTEMS  : All other systems reviewed and negative except where noted in the History of Present Illness.   PHYSICAL EXAM: BP 128/64   Pulse (!) 57   Temp 98.5 F (36.9 C)   Ht 5\' 3"  (1.6 m)   Wt 157 lb (71.2 kg)   BMI 27.81 kg/m  General: Well developed black female in no acute distress Head: Normocephalic and atraumatic Eyes:  Sclerae anicteric, conjunctiva pink. Ears: Normal auditory acuity Lungs: Clear throughout to auscultation; no increased WOB. Heart: Regular rate and rhythm; no M/R/G. Abdomen: Soft, non-distended.  BS present.  Non-tender. Musculoskeletal: Symmetrical with no gross deformities  Skin: No lesions on visible extremities Extremities: No edema  Neurological: Alert oriented x 4, grossly non-focal Psychological:  Alert and cooperative. Normal mood and affect  ASSESSMENT AND PLAN: *GERD:  Patient is very hard to deal with due to inconsistencies and unclear history that she gives.  Reports itching with rabeprazole generic but cannot afford Aciphex brand, which she has done well with in the past.  Has been on about every other PPI with some type of issue.  She says that she took the AcipHex once at nighttime and slept through the itching side effects.  I suggested that she try that again since taking the medicine at some point in 24-hour is better than not taking it at all, even though it is not the optimal time to be taking it.  Reports that Pepcid helps but only for very short period of time.  Suggested that she could take Pepcid in the mornings for breakthrough acid reflux and then as antihistamine effect may also help with any residual itching side effect as well.  New prescription for rabeprazole sent to the pharmacy.  Follow up in 6 weeks with Dr. Hilarie Fredrickson.   CC:  Binnie Rail,  MD   Addendum: Reviewed and agree with assessment and management plan. Pyrtle, Lajuan Lines, MD

## 2018-12-08 ENCOUNTER — Other Ambulatory Visit: Payer: Self-pay

## 2018-12-08 ENCOUNTER — Telehealth: Payer: Self-pay | Admitting: Gastroenterology

## 2018-12-08 DIAGNOSIS — M79642 Pain in left hand: Secondary | ICD-10-CM | POA: Diagnosis not present

## 2018-12-08 MED ORDER — RABEPRAZOLE SODIUM 20 MG PO TBEC: 20.0000 mg | DELAYED_RELEASE_TABLET | Freq: Every day | ORAL | 3 refills | Status: DC

## 2018-12-08 NOTE — Telephone Encounter (Signed)
I have left message advising patient that unfortunately we no longer get samples of Aciphex.

## 2018-12-08 NOTE — Telephone Encounter (Signed)
Pt states that aciphex is too expensive and would like to speak with you somebody about it. Pls call her.

## 2018-12-09 NOTE — Telephone Encounter (Signed)
Patient with reflux, has questions about meds. Patient called stating Rabeprazole caused itchiness. I advised she take Pepcid 20mg  otc po bid. I will have Janett Billow follow up on patient on Monday.

## 2018-12-10 ENCOUNTER — Telehealth: Payer: Self-pay | Admitting: Nurse Practitioner

## 2018-12-10 NOTE — Telephone Encounter (Signed)
Patient called the answering service on Sat 12/09/2018. She reports having itching when she takes Rabeprazole and Aciphex was not affordable. She is taking Pepcid 20mg  in the am which she is tolerating without adverse side effects but having some heartburn later in the day. I advised she take Pepcid 20mg  po bid and to call the answering service if her symptoms worsened. She was seen in our office by Alonza Bogus PA-C 12/07/2018.

## 2018-12-19 ENCOUNTER — Telehealth: Payer: Self-pay | Admitting: Internal Medicine

## 2018-12-19 NOTE — Telephone Encounter (Signed)
Pt reported that she has been taking Pepcid OTC and that it makes her itch.

## 2018-12-19 NOTE — Telephone Encounter (Signed)
Left Message for pt to call back  °

## 2018-12-20 ENCOUNTER — Encounter: Payer: Self-pay | Admitting: Neurology

## 2018-12-20 ENCOUNTER — Other Ambulatory Visit: Payer: Self-pay

## 2018-12-20 ENCOUNTER — Ambulatory Visit (INDEPENDENT_AMBULATORY_CARE_PROVIDER_SITE_OTHER): Payer: Medicare Other | Admitting: Neurology

## 2018-12-20 VITALS — BP 153/86 | HR 58 | Ht 63.0 in | Wt 160.0 lb

## 2018-12-20 DIAGNOSIS — G3184 Mild cognitive impairment, so stated: Secondary | ICD-10-CM | POA: Diagnosis not present

## 2018-12-20 NOTE — Progress Notes (Signed)
NEUROLOGY FOLLOW UP OFFICE NOTE  [name] [id number] 1951/01/13  HISTORY OF PRESENT ILLNESS: I had the pleasure of seeing Karen Evans in follow-up in the neurology clinic on 12/20/2018.  The patient was last seen 7 months ago for worsening memory. She is alone in the office today. Records and images were personally reviewed where available.  I personally reviewed MRI brain without contrast done 07/2018 which did not show any acute changes. There was moderately advanced chronic microvascular disease. She reports doing well. She denies getting lost driving, denies missing medications or bill payments. She denies leaving the stove on. She states her migraines are not often. She noticed a little dizziness today when she took her vitamin D late. She is independent with dressing and bathing. Mood is good. She denies any falls.   History on Initial Assessment 06/07/2018: This is a 68 year old right-handed woman with a history of hypertension, hyperlipidemia, presenting for evaluation of memory concerns. She reports that she started noticing memory changes "when they started letting people go at the plant in 2016." She noticed she was forgetting things. She reported symptoms to Dr. Quay Burow in November 2019 where she would leave food out without putting it away, one time she left ice cream container out after eating it, and left a doggie bag or groceries in her car. She has burnt food she forgot was cooking and now has to make herself stay in the kitchen. She lost her keys one time and has started putting them in one place. She denies getting lost driving. No missed bills or medications. 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 She lives alone, her family (mother and brother) live in [place]. She has told them of her memory concerns, "they don't think I need to come here." Her maternal aunt had memory issues. No history of  concussions or alcohol use.  She has occasional right hand and left finger tingling. She denies any headaches, dizziness, diplopia, dysarthria/dysphagia, neck/back pain, bowel/bladder dysfunction, anosmia, or tremors. No falls. Sleep is good. Mood "I think it's good." There is no family to corroborate history but there does not appear to be any paranoia or hallucinations.    PAST MEDICAL HISTORY: Past Medical History:  Diagnosis Date  . Allergy   . Anemia    PMH of   . Colon polyp   . Crohn's    ileitis/mild; Dr Sharlett Iles  . Duodenitis without mention of hemorrhage 2007   EGD  . Fibroid, uterine    Dr Garwin Brothers; G 0 P 0  . GERD (gastroesophageal reflux disease)   . Goiter    CTS,RUE > LUE  . Heart murmur   . Hiatal hernia 2002   EGD  . Hypertension   . Internal hemorrhoids   . Rectal polyp 01/24/2012   TUBULAR ADENOMA  . Reflux esophagitis 2007   EGD  . Stricture and stenosis of esophagus 2007   EGD  . Tubular adenoma of colon   . Ulcer   . Vitamin D deficiency     MEDICATIONS: Current Outpatient Medications on File Prior to Visit  Medication Sig Dispense Refill  . acetaminophen (TYLENOL) 500 MG tablet Take 250-500 mg by mouth every 6 (six) hours as needed for headache (pain).    Marland Kitchen atenolol (TENORMIN) 50 MG tablet TAKE 1 TABLET BY MOUTH ONCE DAILY IN THE MORNING 90 tablet 1  . Calcium Carbonate-Vitamin D (CALTRATE  600+D PO) Take 1 tablet by mouth daily.     . cholecalciferol (VITAMIN D) 1000 units tablet Take 1,000 Units by mouth daily.    . famotidine (PEPCID) 10 MG tablet Take 10 mg by mouth daily.    . irbesartan (AVAPRO) 150 MG tablet Take 1 tablet (150 mg total) by mouth daily. 90 tablet 1  . Lactase (LACTAID PO) Take 1 tablet by mouth 2 (two) times daily as needed (stomach upset/ gas).     . Multiple Vitamin (MULTIVITAMIN WITH MINERALS) TABS tablet Take 1 tablet by mouth daily.     No current facility-administered medications on file prior to visit.      ALLERGIES: Allergies  Allergen Reactions  . Cefprozil Hives  . Erythromycin Hives  . Esomeprazole Magnesium Hives  . Penicillins Hives and Rash    Has patient had a PCN reaction causing immediate rash, facial/tongue/throat swelling, SOB or lightheadedness with hypotension: Yes Has patient had a PCN reaction causing severe rash involving mucus membranes or skin necrosis: No Has patient had a PCN reaction that required hospitalization: No Has patient had a PCN reaction occurring within the last 10 years: No If all of the above answers are "NO", then may proceed with Cephalosporin use.  . Sulfonamide Derivatives     hives  . Amlodipine Other (See Comments)    gerd  . Crestor [Rosuvastatin Calcium]     itching  . Crestor [Rosuvastatin]     Lightheadedness, itchy, prickly all over  . Erythromycin Base   . Hydrochlorothiazide Other (See Comments)    lightheadedness  . Pravastatin     Dizzy, muscle soreness  . Sulfa Antibiotics   . Atorvastatin Other (See Comments)    Eyes itch  . Doxycycline Nausea And Vomiting    FAMILY HISTORY: Family History  Problem Relation Age of Onset  . Lymphoma Father   . Diabetes Mother   . Hypertension Mother   . Heart disease Mother   . Penile cancer Paternal Grandfather   . Diabetes Brother        CAD; Dialysis  . Hypertension Brother   . Heart attack Other        1/2 maternal aunts X 2  . Rectal cancer Paternal Grandmother   . Colon cancer Maternal Grandmother   . Cancer Other   . COPD Neg Hx   . Esophageal cancer Neg Hx   . Stomach cancer Neg Hx   . Stroke Neg Hx     SOCIAL HISTORY: Social History   Socioeconomic History  . Marital status: Single    Spouse name: Not on file  . Number of children: 0  . Years of education: Not on file  . Highest education level: Not on file  Occupational History  . Occupation: TESTER    Employer: [employer]  . Financial resource strain: Not on file  . Food insecurity     Worry: Not on file    Inability: Not on file  . Transportation needs    Medical: Not on file    Non-medical: Not on file  Tobacco Use  . Smoking status: Never Smoker  . Smokeless tobacco: Never Used  Substance and Sexual Activity  . Alcohol use: No  . Drug use: No  . Sexual activity: Not on file  Lifestyle  . Physical activity    Days per week: Not on file    Minutes per session: Not on file  . Stress: Not on file  Relationships  .  Social Herbalist on phone: Not on file    Gets together: Not on file    Attends religious service: Not on file    Active member of club or organization: Not on file    Attends meetings of clubs or organizations: Not on file    Relationship status: Not on file  . Intimate partner violence    Fear of current or ex partner: Not on file    Emotionally abused: Not on file    Physically abused: Not on file    Forced sexual activity: Not on file  Other Topics Concern  . Not on file  Social History Narrative   Right handed      Some College    REVIEW OF SYSTEMS: Constitutional: No fevers, chills, or sweats, no generalized fatigue, change in appetite Eyes: No visual changes, double vision, eye pain Ear, nose and throat: No hearing loss, ear pain, nasal congestion, sore throat Cardiovascular: No chest pain, palpitations Respiratory:  No shortness of breath at rest or with exertion, wheezes GastrointestinaI: No nausea, vomiting, diarrhea, abdominal pain, fecal incontinence Genitourinary:  No dysuria, urinary retention or frequency Musculoskeletal:  No neck pain, back pain Integumentary: No rash, pruritus, skin lesions Neurological: as above Psychiatric: No depression, insomnia, anxiety Endocrine: No palpitations, fatigue, diaphoresis, mood swings, change in appetite, change in weight, increased thirst Hematologic/Lymphatic:  No anemia, purpura, petechiae. Allergic/Immunologic: no itchy/runny eyes, nasal congestion, recent allergic  reactions, rashes  PHYSICAL EXAM: Vitals:   12/20/18 1529  BP: (!) 153/86  Pulse: (!) 58  SpO2: 96%   General: No acute distress Head:  Normocephalic/atraumatic Skin/Extremities: No rash, no edema Neurological Exam: alert and oriented to person, place, and time. No aphasia or dysarthria. Fund of knowledge is appropriate.  Remote memory intact.  Attention and concentration are normal.  Able to name objects and repeat phrases.  Montreal Cognitive Assessment  12/20/2018 06/07/2018  Visuospatial/ Executive (0/5) 4 4  Naming (0/3) 3 3  Attention: Read list of digits (0/2) 1 2  Attention: Read list of letters (0/1) 1 1  Attention: Serial 7 subtraction starting at 100 (0/3) 1 0  Language: Repeat phrase (0/2) 2 2  Language : Fluency (0/1) 0 0  Abstraction (0/2) 2 0  Delayed Recall (0/5) 0 3  Orientation (0/6) 6 6  Total 20 21    Cranial nerves: Pupils equal, round, reactive to light.  Extraocular movements intact with no nystagmus. Visual fields full. Facial sensation intact. No facial asymmetry. Tongue, uvula, palate midline.  Motor: Bulk and tone normal, muscle strength 5/5 throughout with no pronator drift.  Finger to nose testing intact.  Gait narrow-based and steady, able to tandem walk adequately.  Romberg negative.  IMPRESSION: This is a 68 yo RH woman with a history of hypertension, hyperlipidemia, with Mild Cognitive Impairment. MOCA score today 20/30 (21/30 in January 2020). She is alone in the office with no family to corroborate history, but denies any difficulties with complex tasks. MRI brain showed moderately advanced chronic microvascular disease. We discussed the importance of control of vascular risk factors, physical exercise, and brain stimulation exercises for brain health. She would like to reconnect with her dietician. Follow-up in 6 months, she knows to call for any changes.   Thank you for allowing me to participate in her care.  Please do not hesitate to call for any  questions or concerns.    Ellouise Newer, M.D.   CC: Dr. Quay Burow

## 2018-12-20 NOTE — Patient Instructions (Signed)
RECOMMENDATIONS FOR ALL PATIENTS WITH MEMORY PROBLEMS: 1. Continue to exercise (Recommend 30 minutes of walking everyday, or 3 hours every week) 2. Increase social interactions - continue going to [place] and enjoy social gatherings with friends and family 3. Eat healthy, avoid fried foods and eat more fruits and vegetables 4. Maintain adequate blood pressure, blood sugar, and blood cholesterol level. Reducing the risk of stroke and cardiovascular disease also helps promoting better memory. 5. Avoid stressful situations. Live a simple life and avoid aggravations. Organize your time and prepare for the next day in anticipation. 6. Sleep well, avoid any interruptions of sleep and avoid any distractions in the bedroom that may interfere with adequate sleep quality 7. Avoid sugar, avoid sweets as there is a strong link between excessive sugar intake, diabetes, and cognitive impairment The Mediterranean diet has been shown to help patients reduce the risk of progressive memory disorders and reduces cardiovascular risk. This includes eating fish, eat fruits and green leafy vegetables, nuts like almonds and hazelnuts, walnuts, and also use olive oil. Avoid fast foods and fried foods as much as possible. Avoid sweets and sugar as sugar use has been linked to worsening of memory function.

## 2018-12-20 NOTE — Telephone Encounter (Signed)
Pt did not return call. 

## 2018-12-22 DIAGNOSIS — M79641 Pain in right hand: Secondary | ICD-10-CM | POA: Diagnosis not present

## 2019-01-08 ENCOUNTER — Telehealth: Payer: Self-pay | Admitting: Internal Medicine

## 2019-01-08 NOTE — Telephone Encounter (Signed)
Pt reported that Pepcid is not working and that she is experiencing abdominal pain.

## 2019-01-09 NOTE — Telephone Encounter (Signed)
Left message for pt to call back  °

## 2019-01-09 NOTE — Telephone Encounter (Signed)
Pt states she is having some rectal pain/discomfort on the right side of her rectum when she has a BM. Pt states she has seen some BRB. Pt scheduled to see Tye Savoy NP 01/18/19@3pm . Pt aware of appt.

## 2019-01-16 ENCOUNTER — Other Ambulatory Visit: Payer: Self-pay | Admitting: Internal Medicine

## 2019-01-18 ENCOUNTER — Ambulatory Visit (INDEPENDENT_AMBULATORY_CARE_PROVIDER_SITE_OTHER): Payer: Medicare Other | Admitting: Nurse Practitioner

## 2019-01-18 ENCOUNTER — Encounter: Payer: Self-pay | Admitting: Nurse Practitioner

## 2019-01-18 ENCOUNTER — Other Ambulatory Visit: Payer: Self-pay

## 2019-01-18 VITALS — BP 118/70 | HR 64 | Temp 97.0°F | Ht 63.0 in | Wt 158.0 lb

## 2019-01-18 DIAGNOSIS — K648 Other hemorrhoids: Secondary | ICD-10-CM | POA: Diagnosis not present

## 2019-01-18 DIAGNOSIS — K5900 Constipation, unspecified: Secondary | ICD-10-CM | POA: Diagnosis not present

## 2019-01-18 DIAGNOSIS — K219 Gastro-esophageal reflux disease without esophagitis: Secondary | ICD-10-CM | POA: Diagnosis not present

## 2019-01-18 MED ORDER — HYDROCORTISONE (PERIANAL) 2.5 % EX CREA: 1.0000 "application " | TOPICAL_CREAM | Freq: Two times a day (BID) | CUTANEOUS | 1 refills | Status: DC

## 2019-01-18 NOTE — Patient Instructions (Addendum)
If you are age 66 or older, your body mass index should be between 23-30. Your Body mass index is 27.99 kg/m. If this is out of the aforementioned range listed, please consider follow up with your Primary Care Provider.  If you are age 63 or younger, your body mass index should be between 19-25. Your Body mass index is 27.99 kg/m. If this is out of the aformentioned range listed, please consider follow up with your Primary Care Provider.   We have sent the following medications to your pharmacy for you to pick up at your convenience: Anusol cream  - use for 10 days  Start Miralax daily.  (this is over-the-counter)  Drink 6-8 8 ounce glasses of water daily.  Sleep on a wedge pillow. Go to bed on an empty stomach for 2 hours.  Thank you for choosing me and [facility].   Tye Savoy, NP

## 2019-01-18 NOTE — Progress Notes (Signed)
Chief Complaint:  Rectal pain    IMPRESSION and PLAN:    38.  68 year old female here with several week history of rectal pain in setting of constipation.  No bleeding.  A few external hemorrhoids and on anoscopy there were some inflamed internal hemorrhoids as well.  No fissures appreciated.  -Anusol cream intrarectally twice daily x10 days -Caltrate is probably contributing to constipation but she apparently needs to take it.  Continue with good hydration.  Add MiraLAX 1 capful daily  2. GERD, having heartburn at night.  Intolerant to most PPIs, currently on twice daily Pepcid. -Discussed antireflux measures.  She has a wedge pillow but is not using it, I encouraged her to use the wedge pillow -She is not going to bed on an empty stomach.  Discussed the importance of doing so. -If heartburn persists can add Carafate at night  3. Multiple drug allergies  HPI:     Patient is a 68 year old female known to Dr. Hilarie Fredrickson for history of adenomatous polyps and GERD.  She was seen by Alonza Bogus, PA. on 12/07/2018 for evaluation of GERD.  She comes in today with complaints of rectal pain present for several weeks.  She has been constipated, thinks Caltrate is the culprit and this has just been recommended to be increased to twice a day.  She has not started twice daily dosing as of yet.  She drinks a lot of water but other than that does not take anything for constipation.  She has no bleeding despite the straining.   Addition to above patient is asking for something else to do for her heartburn.  I reviewed office note from 12/07/2018.  Patient has been intolerant of nearly every PPI.  We sent in a prescription for Rabeprazole but currently on twice daily Pepcid??  She is most bothered by heartburn at night.  Has a wedge pillow at home but does not use it.  She does not go to bed on an empty stomach  Review of systems:     No chest pain, no SOB, no fevers, no urinary sx   Past Medical  History:  Diagnosis Date  . Allergy   . Anemia    PMH of   . Colon polyp   . Crohn's    ileitis/mild; Dr Sharlett Iles  . Duodenitis without mention of hemorrhage 2007   EGD  . Fibroid, uterine    Dr Garwin Brothers; G 0 P 0  . GERD (gastroesophageal reflux disease)   . Goiter    CTS,RUE > LUE  . Heart murmur   . Hiatal hernia 2002   EGD  . Hypertension   . Internal hemorrhoids   . Rectal polyp 01/24/2012   TUBULAR ADENOMA  . Reflux esophagitis 2007   EGD  . Stricture and stenosis of esophagus 2007   EGD  . Tubular adenoma of colon   . Ulcer   . Vitamin D deficiency     Patient's surgical history, family medical history, social history, medications and allergies were all reviewed in Epic    Current Outpatient Medications  Medication Sig Dispense Refill  . acetaminophen (TYLENOL) 500 MG tablet Take 250-500 mg by mouth every 6 (six) hours as needed for headache (pain).    Marland Kitchen atenolol (TENORMIN) 50 MG tablet TAKE 1 TABLET BY MOUTH ONCE DAILY IN THE MORNING 90 tablet 1  . Calcium Carbonate-Vitamin D (CALTRATE 600+D PO) Take 1 tablet by mouth daily.     . cholecalciferol (  VITAMIN D) 1000 units tablet Take 1,000 Units by mouth daily.    . famotidine (PEPCID) 10 MG tablet Take 10 mg by mouth daily.    . hydrocortisone (ANUSOL-HC) 25 MG suppository Place 25 mg rectally as needed for hemorrhoids or anal itching.    . irbesartan (AVAPRO) 150 MG tablet Take 1 tablet (150 mg total) by mouth daily. 90 tablet 1  . Lactase (LACTAID PO) Take 1 tablet by mouth 2 (two) times daily as needed (stomach upset/ gas).     . Multiple Vitamin (MULTIVITAMIN WITH MINERALS) TABS tablet Take 1 tablet by mouth daily.     No current facility-administered medications for this visit.     Physical Exam:     BP 118/70   Pulse 64   Temp (!) 97 F (36.1 C)   Ht 5\' 3"  (1.6 m)   Wt 158 lb (71.7 kg)   BMI 27.99 kg/m   GENERAL:  Pleasant female in NAD PSYCH: : Cooperative, normal affect EENT:  conjunctiva  pink, mucous membranes moist, neck supple without masses PULM: Normal respiratory effort, lungs CTA bilaterally, no wheezing ABDOMEN:  Nondistended, soft, nontender. No obvious masses, no hepatomegaly,  normal bowel sounds RECTAL: A few mildly inflamed external hemorrhoids.  No masses felt on DRE.  Internal hemorrhoids on anoscopy Musculoskeletal:  Normal muscle tone, normal strength NEURO: Alert and oriented x 3, no focal neurologic deficits   Tye Savoy , NP 01/18/2019, 3:28 PM

## 2019-02-05 NOTE — Progress Notes (Signed)
Subjective:    Patient ID: Karen Evans, female    DOB: 1951-03-22, 68 y.o.   MRN: [id number]  HPI The patient is here for follow up.  She is not exercising regularly.    Hypertension: She is taking her medication daily. She is compliant with a low sodium diet.  She denies chest pain, palpitations, edema, shortness of breath and regular headaches.     Hyperlipidemia: She is controlling her cholesterol with diet. She did tolerate crestor.  She is compliant with a low fat/cholesterol diet.   Prediabetes:  She is compliant with a low sugar/carbohydrate diet.  She is exercising regularly.  Mild cognitive impairment:  She did see Dr Delice Lesch and was diagnosed with MCI. She is active but not exercising regularly.      Medications and allergies reviewed with patient and updated if appropriate.  Patient Active Problem List   Diagnosis Date Noted  . Medication reaction, subsequent encounter 09/25/2018  . Mild cognitive impairment 03/20/2018  . Osteopenia 07/25/2017  . Joint pain 07/12/2017  . Rectal bleeding 04/27/2016  . Prediabetes 10/27/2015  . History of colonic polyps 10/12/2013  . Constipation 10/12/2013  . Dyspnea 07/23/2013  . Hyperlipidemia 03/29/2011  . GERD (gastroesophageal reflux disease) 02/18/2011  . 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 VITAMIN D DEFICIENCY 03/26/2010  . IRRITABLE BOWEL SYNDROME 04/25/2008  . HIATAL HERNIA 04/24/2008  . RHINITIS 03/14/2008  . History of uterine fibroid 12/29/2007  . Goiter 12/29/2007  . Carpal tunnel syndrome 12/29/2007  . Essential hypertension 12/29/2007    Current Outpatient Medications on File Prior to Visit  Medication Sig Dispense Refill  . acetaminophen (TYLENOL) 500 MG tablet Take 250-500 mg by mouth every 6 (six) hours as needed for headache (pain).    Marland Kitchen atenolol (TENORMIN) 50 MG tablet TAKE 1 TABLET BY MOUTH ONCE DAILY IN THE MORNING 90 tablet 1  . Calcium Carbonate-Vitamin D (CALTRATE 600+D PO) Take 1  tablet by mouth daily.     . cholecalciferol (VITAMIN D) 1000 units tablet Take 1,000 Units by mouth daily.    . famotidine (PEPCID) 10 MG tablet Take 10 mg by mouth daily.    . hydrocortisone (ANUSOL-HC) 25 MG suppository Place 25 mg rectally as needed for hemorrhoids or anal itching.    . irbesartan (AVAPRO) 150 MG tablet Take 1 tablet (150 mg total) by mouth daily. 90 tablet 1  . Lactase (LACTAID PO) Take 1 tablet by mouth 2 (two) times daily as needed (stomach upset/ gas).     . Multiple Vitamin (MULTIVITAMIN WITH MINERALS) TABS tablet Take 1 tablet by mouth daily.     No current facility-administered medications on file prior to visit.     Past Medical History:  Diagnosis Date  . Allergy   . Anemia    PMH of   . Colon polyp   . Crohn's    ileitis/mild; Dr Sharlett Iles  . Duodenitis without mention of hemorrhage 2007   EGD  . Fibroid, uterine    Dr Garwin Brothers; G 0 P 0  . GERD (gastroesophageal reflux disease)   . Goiter    CTS,RUE > LUE  . Heart murmur   . Hiatal hernia 2002   EGD  . Hypertension   . Internal hemorrhoids   . Rectal polyp 01/24/2012   TUBULAR ADENOMA  . Reflux esophagitis 2007   EGD  . Stricture and stenosis of esophagus 2007   EGD  . Tubular adenoma of colon   . Ulcer   .  Vitamin D deficiency     Past Surgical History:  Procedure Laterality Date  . COLONOSCOPY     polyps;granular ileitis 2006;2007 normal, Dr Sharlett Iles  . HEMORRHOID SURGERY    . OPERATIVE HYSTEROSCOPY  08-05-04   Dr Garwin Brothers  . OVARIAN CYST REMOVAL    . POLYPECTOMY    . UPPER GASTROINTESTINAL ENDOSCOPY      Social History   Socioeconomic History  . Marital status: Single    Spouse name: Not on file  . Number of children: 0  . Years of education: Not on file  . Highest education level: Not on file  Occupational History  . Occupation: TESTER    Employer: [employer]  . Financial resource strain: Not on file  . Food insecurity    Worry: Not on file     Inability: Not on file  . Transportation needs    Medical: Not on file    Non-medical: Not on file  Tobacco Use  . Smoking status: Never Smoker  . Smokeless tobacco: Never Used  Substance and Sexual Activity  . Alcohol use: No  . Drug use: No  . Sexual activity: Not on file  Lifestyle  . Physical activity    Days per week: Not on file    Minutes per session: Not on file  . Stress: Not on file  Relationships  . Social Herbalist on phone: Not on file    Gets together: Not on file    Attends religious service: Not on file    Active member of club or organization: Not on file    Attends meetings of clubs or organizations: Not on file    Relationship status: Not on file  Other Topics Concern  . Not on file  Social History Narrative   Right handed      Some College    Family History  Problem Relation Age of Onset  . Lymphoma Father   . Diabetes Mother   . Hypertension Mother   . Heart disease Mother   . Penile cancer Paternal Grandfather   . Diabetes Brother        CAD; Dialysis  . Hypertension Brother   . Heart attack Other        1/2 maternal aunts X 2  . Rectal cancer Paternal Grandmother   . Colon cancer Maternal Grandmother   . Cancer Other   . COPD Neg Hx   . Esophageal cancer Neg Hx   . Stomach cancer Neg Hx   . Stroke Neg Hx     Review of Systems  Constitutional: Negative for chills and fever.  HENT: Positive for postnasal drip. Negative for sore throat and trouble swallowing.   Respiratory: Negative for cough, shortness of breath and wheezing.   Cardiovascular: Negative for chest pain, palpitations and leg swelling.  Neurological: Positive for headaches (occ - sinus). Negative for dizziness.       Objective:   Vitals:   02/06/19 1332  BP: 130/72  Pulse: 61  Resp: 16  Temp: 98.5 F (36.9 C)  SpO2: 98%   BP Readings from Last 3 Encounters:  02/06/19 130/72  01/18/19 118/70  12/20/18 (!) 153/86   Wt Readings from Last 3  Encounters:  02/06/19 159 lb 12.8 oz (72.5 kg)  01/18/19 158 lb (71.7 kg)  12/20/18 160 lb (72.6 kg)   Body mass index is 28.31 kg/m.   Physical Exam    Constitutional: Appears well-developed and well-nourished. No  distress.  HENT:  Head: Normocephalic and atraumatic.  Neck: Neck supple. No tracheal deviation present. No thyromegaly present.  No cervical lymphadenopathy Cardiovascular: Normal rate, regular rhythm and normal heart sounds.   No murmur heard. No carotid bruit .  No edema Pulmonary/Chest: Effort normal and breath sounds normal. No respiratory distress. No has no wheezes. No rales.  Skin: Skin is warm and dry. Not diaphoretic.  Psychiatric: Normal mood and affect. Behavior is normal.      Assessment & Plan:    See Problem List for Assessment and Plan of chronic medical problems.

## 2019-02-05 NOTE — Patient Instructions (Addendum)
  Tests ordered today. Your results will be released to MyChart (or called to you) after review.  If any changes need to be made, you will be notified at that same time.    Medications reviewed and updated.  Changes include :   none     Please followup in 6 months   

## 2019-02-06 ENCOUNTER — Other Ambulatory Visit (INDEPENDENT_AMBULATORY_CARE_PROVIDER_SITE_OTHER): Payer: Medicare Other

## 2019-02-06 ENCOUNTER — Encounter: Payer: Self-pay | Admitting: Internal Medicine

## 2019-02-06 ENCOUNTER — Other Ambulatory Visit: Payer: Self-pay

## 2019-02-06 ENCOUNTER — Ambulatory Visit (INDEPENDENT_AMBULATORY_CARE_PROVIDER_SITE_OTHER): Payer: Medicare Other | Admitting: Internal Medicine

## 2019-02-06 VITALS — BP 130/72 | HR 61 | Temp 98.5°F | Resp 16 | Ht 63.0 in | Wt 159.8 lb

## 2019-02-06 DIAGNOSIS — R7303 Prediabetes: Secondary | ICD-10-CM

## 2019-02-06 DIAGNOSIS — E7849 Other hyperlipidemia: Secondary | ICD-10-CM

## 2019-02-06 DIAGNOSIS — G3184 Mild cognitive impairment, so stated: Secondary | ICD-10-CM | POA: Diagnosis not present

## 2019-02-06 DIAGNOSIS — I10 Essential (primary) hypertension: Secondary | ICD-10-CM | POA: Diagnosis not present

## 2019-02-06 LAB — CBC WITH DIFFERENTIAL/PLATELET
Basophils Absolute: 0 10*3/uL (ref 0.0–0.1)
Basophils Relative: 0.4 % (ref 0.0–3.0)
Eosinophils Absolute: 0.2 10*3/uL (ref 0.0–0.7)
Eosinophils Relative: 1.9 % (ref 0.0–5.0)
HCT: 38.8 % (ref 36.0–46.0)
Hemoglobin: 12.5 g/dL (ref 12.0–15.0)
Lymphocytes Relative: 23.7 % (ref 12.0–46.0)
Lymphs Abs: 1.9 10*3/uL (ref 0.7–4.0)
MCHC: 32.2 g/dL (ref 30.0–36.0)
MCV: 81 fl (ref 78.0–100.0)
Monocytes Absolute: 0.9 10*3/uL (ref 0.1–1.0)
Monocytes Relative: 11.7 % (ref 3.0–12.0)
Neutro Abs: 5 10*3/uL (ref 1.4–7.7)
Neutrophils Relative %: 62.3 % (ref 43.0–77.0)
Platelets: 243 10*3/uL (ref 150.0–400.0)
RBC: 4.79 Mil/uL (ref 3.87–5.11)
RDW: 14.1 % (ref 11.5–15.5)
WBC: 8.1 10*3/uL (ref 4.0–10.5)

## 2019-02-06 LAB — LDL CHOLESTEROL, DIRECT: Direct LDL: 140 mg/dL

## 2019-02-06 LAB — COMPREHENSIVE METABOLIC PANEL
ALT: 14 U/L (ref 0–35)
AST: 15 U/L (ref 0–37)
Albumin: 3.6 g/dL (ref 3.5–5.2)
Alkaline Phosphatase: 78 U/L (ref 39–117)
BUN: 13 mg/dL (ref 6–23)
CO2: 29 mEq/L (ref 19–32)
Calcium: 10.1 mg/dL (ref 8.4–10.5)
Chloride: 105 mEq/L (ref 96–112)
Creatinine, Ser: 0.79 mg/dL (ref 0.40–1.20)
GFR: 88.05 mL/min (ref >60.00)
Glucose, Bld: 82 mg/dL (ref 70–99)
Potassium: 3.9 mEq/L (ref 3.5–5.1)
Sodium: 138 mEq/L (ref 135–145)
Total Bilirubin: 0.3 mg/dL (ref 0.2–1.2)
Total Protein: 6.9 g/dL (ref 6.0–8.3)

## 2019-02-06 LAB — HEMOGLOBIN A1C: Hgb A1c MFr Bld: 6.3 % (ref 4.6–6.5)

## 2019-02-06 LAB — LIPID PANEL
Cholesterol: 229 mg/dL — ABNORMAL HIGH (ref 0–200)
HDL: 60.1 mg/dL (ref >39.00)
NonHDL: 169.1
Total CHOL/HDL Ratio: 4
Triglycerides: 202 mg/dL — ABNORMAL HIGH (ref 0.0–149.0)
VLDL: 40.4 mg/dL — ABNORMAL HIGH (ref 0.0–40.0)

## 2019-02-06 NOTE — Assessment & Plan Note (Signed)
Check a1c Low sugar / carb diet Stressed regular exercise   

## 2019-02-06 NOTE — Assessment & Plan Note (Signed)
Check lipid panel  Lifestyle controlled Regular exercise and healthy diet encouraged  

## 2019-02-06 NOTE — Assessment & Plan Note (Signed)
BP well controlled Current regimen effective and well tolerated Continue current medications at current doses cmp  

## 2019-02-06 NOTE — Assessment & Plan Note (Signed)
Not exercising regularly She does some coloring encouraged regular exercise

## 2019-02-14 ENCOUNTER — Ambulatory Visit (INDEPENDENT_AMBULATORY_CARE_PROVIDER_SITE_OTHER): Payer: Medicare Other | Admitting: Podiatry

## 2019-02-14 ENCOUNTER — Encounter: Payer: Self-pay | Admitting: Podiatry

## 2019-02-14 ENCOUNTER — Other Ambulatory Visit: Payer: Self-pay

## 2019-02-14 VITALS — BP 121/64

## 2019-02-14 DIAGNOSIS — M79609 Pain in unspecified limb: Secondary | ICD-10-CM | POA: Diagnosis not present

## 2019-02-14 DIAGNOSIS — B351 Tinea unguium: Secondary | ICD-10-CM

## 2019-02-14 DIAGNOSIS — L600 Ingrowing nail: Secondary | ICD-10-CM | POA: Diagnosis not present

## 2019-02-14 NOTE — Progress Notes (Signed)
Subjective:   Patient ID: Karen Evans, female   DOB: 68 y.o.   MRN: [id number]   HPI Patient presents with concern of ingrown toenails and also her nails are thick and she cannot take care of them herself.  She states that they get sore at times and make it difficult to wear shoe gear comfortably.  Patient does not smoke likes to be active   Review of Systems  All other systems reviewed and are negative.       Objective:  Physical Exam Vitals signs and nursing note reviewed.  Constitutional:      Appearance: She is well-developed.  Pulmonary:     Effort: Pulmonary effort is normal.  Musculoskeletal: Normal range of motion.  Skin:    General: Skin is warm.  Neurological:     Mental Status: She is alert.     Neurovascular status intact muscle strength adequate range of motion within normal limits.  Patient is found to have thickened brittle nailbeds 1-5 both feet with incurvated beds on several nails that do get sore at times with history of ingrown toenails.  Has good digital perfusion F2     Assessment:  Chronic mycotic nail infection along with ingrown toenail component     Plan:  H&P conditions reviewed and today I debrided nailbeds 1-5 both feet with no iatrogenic bleeding and reappoint for [place] to recheck

## 2019-02-14 NOTE — Progress Notes (Signed)
Addendum: Reviewed and agree with assessment and management plan. Pyrtle, Jay M, MD  

## 2019-04-03 ENCOUNTER — Other Ambulatory Visit: Payer: Self-pay | Admitting: Internal Medicine

## 2019-04-05 DIAGNOSIS — Z1231 Encounter for screening mammogram for malignant neoplasm of breast: Secondary | ICD-10-CM | POA: Diagnosis not present

## 2019-04-05 LAB — HM MAMMOGRAPHY

## 2019-04-10 ENCOUNTER — Encounter: Payer: Self-pay | Admitting: Internal Medicine

## 2019-04-10 DIAGNOSIS — H25813 Combined forms of age-related cataract, bilateral: Secondary | ICD-10-CM | POA: Diagnosis not present

## 2019-04-10 DIAGNOSIS — H0014 Chalazion left upper eyelid: Secondary | ICD-10-CM | POA: Diagnosis not present

## 2019-04-10 DIAGNOSIS — H5213 Myopia, bilateral: Secondary | ICD-10-CM | POA: Diagnosis not present

## 2019-04-25 ENCOUNTER — Other Ambulatory Visit: Payer: Self-pay

## 2019-04-25 NOTE — Patient Outreach (Signed)
[facility]) Care Management  04/25/2019  Miryah Delgatto Mcginness 10/07/1950 [id number]   Medication Adherence call to Mrs. Julieta Flewellen [employer] spoke with patient she is past due on Rosuvastatin 5 mg patient explain she is no longer taking this medication,doctor took her off. Patient ask to call [facility]  on Atenolol she ask to see it they had it in stock because last time they had told her it was on back order,[facility] will mail out with in 5-7 days. Mrs. Klepper is showing past due under [place].   [place] Management Direct Dial [phone]  Fax [phone] [email or web address]

## 2019-05-17 ENCOUNTER — Other Ambulatory Visit: Payer: Self-pay

## 2019-05-17 DIAGNOSIS — Z20822 Contact with and (suspected) exposure to covid-19: Secondary | ICD-10-CM

## 2019-05-18 LAB — NOVEL CORONAVIRUS, NAA: SARS-CoV-2, NAA: NOT DETECTED

## 2019-07-20 ENCOUNTER — Ambulatory Visit: Payer: Medicare Other | Admitting: Neurology

## 2019-07-24 ENCOUNTER — Encounter: Payer: Self-pay | Admitting: Neurology

## 2019-07-24 ENCOUNTER — Other Ambulatory Visit: Payer: Self-pay

## 2019-07-24 ENCOUNTER — Ambulatory Visit: Payer: Medicare Other | Admitting: Neurology

## 2019-07-24 VITALS — BP 130/62 | HR 60 | Ht 64.0 in | Wt 156.2 lb

## 2019-07-24 DIAGNOSIS — G3184 Mild cognitive impairment, so stated: Secondary | ICD-10-CM

## 2019-07-24 NOTE — Patient Instructions (Signed)
1. Schedule Neurocognitive testing  2. Follow-up in 6 months, call for any changes   RECOMMENDATIONS FOR ALL PATIENTS WITH MEMORY PROBLEMS: 1. Continue to exercise (Recommend 30 minutes of walking everyday, or 3 hours every week) 2. Increase social interactions - continue going to [place] and enjoy social gatherings with friends and family 3. Eat healthy, avoid fried foods and eat more fruits and vegetables 4. Maintain adequate blood pressure, blood sugar, and blood cholesterol level. Reducing the risk of stroke and cardiovascular disease also helps promoting better memory. 5. Avoid stressful situations. Live a simple life and avoid aggravations. Organize your time and prepare for the next day in anticipation. 6. Sleep well, avoid any interruptions of sleep and avoid any distractions in the bedroom that may interfere with adequate sleep quality 7. Avoid sugar, avoid sweets as there is a strong link between excessive sugar intake, diabetes, and cognitive impairment The Mediterranean diet has been shown to help patients reduce the risk of progressive memory disorders and reduces cardiovascular risk. This includes eating fish, eat fruits and green leafy vegetables, nuts like almonds and hazelnuts, walnuts, and also use olive oil. Avoid fast foods and fried foods as much as possible. Avoid sweets and sugar as sugar use has been linked to worsening of memory function.

## 2019-07-24 NOTE — Addendum Note (Signed)
Addended by: Jake Seats on: 07/24/2019 03:59 PM   Modules accepted: Orders

## 2019-07-24 NOTE — Progress Notes (Signed)
NEUROLOGY FOLLOW UP OFFICE NOTE  Karen Evans [id number] Jul 13, 1950   HISTORY OF PRESENT ILLNESS: I had the pleasure of seeing Karen Evans in follow-up in the neurology clinic on 07/24/2019. She was last seen 7 months ago for worsening memory. [place] 20/30 in August 2020. She is again alone in the office today without family to help corroborate history. She lives alone. She denies missing medications or bill payments. She denies getting lost driving, but states she has not been out of the house so she had to get it together to get to [employer]. She has not seen her family in a year, she talks to her mother in [place] daily and states her mother repeats herself. She denies any headaches, no falls. She has dizziness when she forgets her sinus medication. She has some low back pain. Sleep and mood are good.    She reports doing well. She denies getting lost driving, denies missing medications or bill payments. She denies leaving the stove on. She states her migraines are not often. She noticed a little dizziness today when she took her vitamin D late. She is independent with dressing and bathing. Mood is good. She denies any falls.    History on Initial Assessment 06/07/2018: This is a 69 year old right-handed woman with a history of hypertension, hyperlipidemia, presenting for evaluation of memory concerns. She reports that she started noticing memory changes "when they started letting people go at the plant in 2016." She noticed she was forgetting things. She reported symptoms to Dr. Quay Burow in November 2019 where she would leave food out without putting it away, one time she left ice cream container out after eating it, and left a doggie bag or groceries in her car. She has burnt food she forgot was cooking and now has to make herself stay in the kitchen. She lost her keys one time and has started putting them in one place. She denies getting lost driving. No missed bills or medications. 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 She lives alone, her family (mother and brother) live in [place]. She has told them of her memory concerns, "they don't think I need to come here." Her maternal aunt had memory issues. No history of concussions or alcohol use.  She has occasional right hand and left finger tingling. She denies any headaches, dizziness, diplopia, dysarthria/dysphagia, neck/back pain, bowel/bladder dysfunction, anosmia, or tremors. No falls. Sleep is good. Mood "I think it's good." There is no family to corroborate history but there does not appear to be any paranoia or hallucinations.   Diagnostic Data: MRI brain without contrast done 07/2018 which did not show any acute changes. There was moderately advanced chronic microvascular disease. Lab Results  Component Value Date   TSH 0.41 03/20/2018   Lab Results  Component Value Date   VITAMINB12 1,031 (H) 03/20/2018     PAST MEDICAL HISTORY: Past Medical History:  Diagnosis Date  . Allergy   . Anemia    PMH of   . Colon polyp   . Crohn's    ileitis/mild; Dr Sharlett Iles  . Duodenitis without mention of hemorrhage 2007   EGD  . Fibroid, uterine    Dr Garwin Brothers; G 0 P 0  . GERD (gastroesophageal reflux disease)   . Goiter    CTS,RUE > LUE  . Heart murmur   . Hiatal hernia 2002   EGD  .  Hypertension   . Internal hemorrhoids   . Migraines    when takes vitamins   . Rectal polyp 01/24/2012   TUBULAR ADENOMA  . Reflux esophagitis 2007   EGD  . Stricture and stenosis of esophagus 2007   EGD  . Tubular adenoma of colon   . Ulcer   . Vitamin D deficiency     MEDICATIONS: Current Outpatient Medications on File Prior to Visit  Medication Sig Dispense Refill  . acetaminophen (TYLENOL) 500 MG tablet Take 250-500 mg by mouth every 6 (six) hours as needed for headache (pain).    Marland Kitchen atenolol (TENORMIN) 50 MG tablet TAKE 1 TABLET BY MOUTH ONCE DAILY IN  THE MORNING 90 tablet 3  . Calcium Carbonate-Vitamin D (CALTRATE 600+D PO) Take 1 tablet by mouth daily.     . cholecalciferol (VITAMIN D) 1000 units tablet Take 1,000 Units by mouth daily.    . famotidine (PEPCID) 10 MG tablet Take 10 mg by mouth daily.    Marland Kitchen HYDROcodone-acetaminophen (NORCO/VICODIN) 5-325 MG tablet hydrocodone 5 mg-acetaminophen 325 mg tablet  TAKE 1 TABLET BY MOUTH EVERY 4 TO 6 HOURS AS NEEDED FOR PAIN FOR 5 DAYS    . hydrocortisone (ANUSOL-HC) 25 MG suppository Place 25 mg rectally as needed for hemorrhoids or anal itching.    . irbesartan (AVAPRO) 150 MG tablet TAKE 1 TABLET BY MOUTH  DAILY 90 tablet 3  . Lactase (LACTAID PO) Take 1 tablet by mouth 2 (two) times daily as needed (stomach upset/ gas).     . Multiple Vitamin (MULTIVITAMIN WITH MINERALS) TABS tablet Take 1 tablet by mouth daily.    . rosuvastatin (CRESTOR) 5 MG tablet      No current facility-administered medications on file prior to visit.    ALLERGIES: Allergies  Allergen Reactions  . Cefprozil Hives  . Erythromycin Hives  . Esomeprazole Magnesium Hives  . Penicillins Hives and Rash    Has patient had a PCN reaction causing immediate rash, facial/tongue/throat swelling, SOB or lightheadedness with hypotension: Yes Has patient had a PCN reaction causing severe rash involving mucus membranes or skin necrosis: No Has patient had a PCN reaction that required hospitalization: No Has patient had a PCN reaction occurring within the last 10 years: No If all of the above answers are "NO", then may proceed with Cephalosporin use.  . Sulfonamide Derivatives     hives  . Amlodipine Other (See Comments)    gerd  . Crestor [Rosuvastatin Calcium]     itching  . Crestor [Rosuvastatin]     Lightheadedness, itchy, prickly all over  . Erythromycin Base   . Hydrochlorothiazide Other (See Comments)    lightheadedness  . Pravastatin     Dizzy, muscle soreness  . Sulfa Antibiotics   . Atorvastatin Other (See  Comments)    Eyes itch  . Doxycycline Nausea And Vomiting    FAMILY HISTORY: Family History  Problem Relation Age of Onset  . Lymphoma Father   . Diabetes Mother   . Hypertension Mother   . Heart disease Mother   . Penile cancer Paternal Grandfather   . Diabetes Brother        CAD; Dialysis  . Hypertension Brother   . Heart attack Other        1/2 maternal aunts X 2  . Rectal cancer Paternal Grandmother   . Colon cancer Maternal Grandmother   . Cancer Other   . COPD Neg Hx   . Esophageal cancer Neg Hx   .  Stomach cancer Neg Hx   . Stroke Neg Hx     SOCIAL HISTORY: Social History   Socioeconomic History  . Marital status: Single    Spouse name: Not on file  . Number of children: 0  . Years of education: Not on file  . Highest education level: Not on file  Occupational History  . Occupation: TESTER    Employer: [employer]  Tobacco Use  . Smoking status: Never Smoker  . Smokeless tobacco: Never Used  Substance and Sexual Activity  . Alcohol use: No  . Drug use: No  . Sexual activity: Not on file  Other Topics Concern  . Not on file  Social History Narrative   Right handed      Some College   Social Determinants of Health   Financial Resource Strain:   . Difficulty of Paying Living Expenses: Not on file  Food Insecurity:   . Worried About Charity fundraiser in the Last Year: Not on file  . Ran Out of Food in the Last Year: Not on file  Transportation Needs:   . Lack of Transportation (Medical): Not on file  . Lack of Transportation (Non-Medical): Not on file  Physical Activity:   . Days of Exercise per Week: Not on file  . Minutes of Exercise per Session: Not on file  Stress:   . Feeling of Stress : Not on file  Social Connections:   . Frequency of Communication with Friends and Family: Not on file  . Frequency of Social Gatherings with Friends and Family: Not on file  . Attends Religious Services: Not on file  . Active Member of Clubs or  Organizations: Not on file  . Attends Archivist Meetings: Not on file  . Marital Status: Not on file  Intimate Partner Violence:   . Fear of Current or Ex-Partner: Not on file  . Emotionally Abused: Not on file  . Physically Abused: Not on file  . Sexually Abused: Not on file    REVIEW OF SYSTEMS: Constitutional: No fevers, chills, or sweats, no generalized fatigue, change in appetite Eyes: No visual changes, double vision, eye pain Ear, nose and throat: No hearing loss, ear pain, nasal congestion, sore throat Cardiovascular: No chest pain, palpitations Respiratory:  No shortness of breath at rest or with exertion, wheezes GastrointestinaI: No nausea, vomiting, diarrhea, abdominal pain, fecal incontinence Genitourinary:  No dysuria, urinary retention or frequency Musculoskeletal:  No neck pain, +back pain Integumentary: No rash, pruritus, skin lesions Neurological: as above Psychiatric: No depression, insomnia, anxiety Endocrine: No palpitations, fatigue, diaphoresis, mood swings, change in appetite, change in weight, increased thirst Hematologic/Lymphatic:  No anemia, purpura, petechiae. Allergic/Immunologic: no itchy/runny eyes, nasal congestion, recent allergic reactions, rashes  PHYSICAL EXAM: Vitals:   07/24/19 1506  BP: 130/62  Pulse: 60  SpO2: 98%   General: No acute distress Head:  Normocephalic/atraumatic Skin/Extremities: No rash, no edema Neurological Exam: alert and oriented to person, place, and time. No aphasia or dysarthria. Fund of knowledge is appropriate.  Remote memory intact.  Attention and concentration are normal. SLUMS 20/30 [employer] Mental Exam 07/24/2019  Weekday Correct 1  Current year 1  What state are we in? 1  Amount spent 1  Amount left 0  # of Animals 3  5 objects recall 4  Number series 0  Hour markers 2  Time correct 1  Placed X in triangle correctly 1  Largest Figure 1  Name of female 0  Date  back to work 2    Type of work [employer] she lived in 2  Total score 20    Cranial nerves: Pupils equal, round, reactive to light.  Extraocular movements intact with no nystagmus. Visual fields full. Facial sensation intact. No facial asymmetry.  Motor: Bulk and tone normal, muscle strength 5/5 throughout with no pronator drift.  Finger to nose testing intact.  Gait narrow-based and steady, able to tandem walk adequately.  Romberg negative.   IMPRESSION: This is a 69 yo RH woman with a history of hypertension, hyperlipidemia, with Mild Cognitive Impairment. SLUMS score today 20/30. MRI brain showed moderately advanced chronic microvascular disease. She is again alone in the office today without family to corroborate history, denies any difficulties with complex tasks. Neurocognitive testing will be ordered to further evaluate memory concerns. We again discussed the importance of control of vascular risk factors, physical exercise, and brain stimulation exercises for brain health. She would like to reconnect with her dietician. Follow-up in 6 months, she knows to call for any changes.   Thank you for allowing me to participate in her care.  Please do not hesitate to call for any questions or concerns.    Ellouise Newer, M.D.   CC: Dr. Quay Burow

## 2019-08-06 NOTE — Progress Notes (Signed)
Subjective:    Patient ID: Karen Evans, female    DOB: [birth year], 69 y.o.   MRN: [id number]  HPI The patient is here for follow up of their chronic medical problems, including hypertension, hyperlipidemia, prediabetes, mild cognitive impairment.  She is not taking all of her medications as prescribed.  She did not take her BP medications today.  She states usually she takes it and it was just today she forgot because she usually takes it in the afternoon, which is when she was waiting to come here.  She is exercising - push ups, knee bends, minimal walking.   She is eating pretty healthy.  She eats too much bread.      Medications and allergies reviewed with patient and updated if appropriate.  Patient Active Problem List   Diagnosis Date Noted  . Encounter for orthopedic follow-up care 11/06/2018  . Medication reaction, subsequent encounter 09/25/2018  . Mild cognitive impairment 03/20/2018  . Osteopenia 07/25/2017  . Pain of left hand 07/12/2017  . Rectal bleeding 04/27/2016  . Prediabetes 10/27/2015  . History of colonic polyps 10/12/2013  . Constipation 10/12/2013  . Dyspnea 07/23/2013  . Hyperlipidemia 03/29/2011  . GERD (gastroesophageal reflux disease) 02/18/2011  . 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 VITAMIN D DEFICIENCY 03/26/2010  . IRRITABLE BOWEL SYNDROME 04/25/2008  . HIATAL HERNIA 04/24/2008  . RHINITIS 03/14/2008  . History of uterine fibroid 12/29/2007  . Goiter 12/29/2007  . 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  . Essential hypertension 12/29/2007    Current Outpatient Medications on File Prior to Visit  Medication Sig Dispense Refill  . acetaminophen (TYLENOL) 500 MG tablet Take 250-500 mg by mouth every 6 (six) hours as needed for headache (pain).    Marland Kitchen atenolol (TENORMIN) 50 MG tablet TAKE 1 TABLET BY MOUTH ONCE DAILY IN THE MORNING 90 tablet 3  . Calcium Carbonate-Vitamin D (CALTRATE 600+D PO) Take 1 tablet by mouth  daily.     . cholecalciferol (VITAMIN D) 1000 units tablet Take 1,000 Units by mouth daily.    . famotidine (PEPCID) 10 MG tablet Take 10 mg by mouth 2 (two) times daily.     . hydrocortisone (ANUSOL-HC) 25 MG suppository Place 25 mg rectally as needed for hemorrhoids or anal itching.    . irbesartan (AVAPRO) 150 MG tablet TAKE 1 TABLET BY MOUTH  DAILY 90 tablet 3  . Lactase (LACTAID PO) Take 1 tablet by mouth 2 (two) times daily as needed (stomach upset/ gas).     . Multiple Vitamin (MULTIVITAMIN WITH MINERALS) TABS tablet Take 1 tablet by mouth daily.     No current facility-administered medications on file prior to visit.    Past Medical History:  Diagnosis Date  . Allergy   . Anemia    PMH of   . Colon polyp   . Crohn's    ileitis/mild; Dr Sharlett Iles  . Duodenitis without mention of hemorrhage 2007   EGD  . Fibroid, uterine    Dr Garwin Brothers; G 0 P 0  . GERD (gastroesophageal reflux disease)   . Goiter    CTS,RUE > LUE  . Heart murmur   . Hiatal hernia 2002   EGD  . Hypertension   . Internal hemorrhoids   . Migraines    when takes vitamins   . Rectal polyp 01/24/2012   TUBULAR ADENOMA  . Reflux esophagitis 2007   EGD  . Stricture and stenosis of esophagus 2007   EGD  .  Tubular adenoma of colon   . Ulcer   . Vitamin D deficiency     Past Surgical History:  Procedure Laterality Date  . COLONOSCOPY     polyps;granular ileitis 2006;2007 normal, Dr Sharlett Iles  . HEMORRHOID SURGERY    . OPERATIVE HYSTEROSCOPY  08-05-04   Dr Garwin Brothers  . OVARIAN CYST REMOVAL    . POLYPECTOMY    . UPPER GASTROINTESTINAL ENDOSCOPY      Social History   Socioeconomic History  . Marital status: Single    Spouse name: Not on file  . Number of children: 0  . Years of education: Not on file  . Highest education level: Not on file  Occupational History  . Occupation: TESTER    Employer: [employer]  Tobacco Use  . Smoking status: Never Smoker  . Smokeless tobacco: Never Used    Substance and Sexual Activity  . Alcohol use: No  . Drug use: No  . Sexual activity: Not on file  Other Topics Concern  . Not on file  Social History Narrative   Right handed      Some College   Social Determinants of Health   Financial Resource Strain:   . Difficulty of Paying Living Expenses:   Food Insecurity:   . Worried About Charity fundraiser in the Last Year:   . Arboriculturist in the Last Year:   Transportation Needs:   . Film/video editor (Medical):   Marland Kitchen Lack of Transportation (Non-Medical):   Physical Activity:   . Days of Exercise per Week:   . Minutes of Exercise per Session:   Stress:   . Feeling of Stress :   Social Connections:   . Frequency of Communication with Friends and Family:   . Frequency of Social Gatherings with Friends and Family:   . Attends Religious Services:   . Active Member of Clubs or Organizations:   . Attends Archivist Meetings:   Marland Kitchen Marital Status:     Family History  Problem Relation Age of Onset  . Lymphoma Father   . Diabetes Mother   . Hypertension Mother   . Heart disease Mother   . Penile cancer Paternal Grandfather   . Diabetes Brother        CAD; Dialysis  . Hypertension Brother   . Heart attack Other        1/2 maternal aunts X 2  . Rectal cancer Paternal Grandmother   . Colon cancer Maternal Grandmother   . Cancer Other   . COPD Neg Hx   . Esophageal cancer Neg Hx   . Stomach cancer Neg Hx   . Stroke Neg Hx     Review of Systems  Constitutional: Negative for chills and fever.  Respiratory: Negative for cough, shortness of breath and wheezing.   Cardiovascular: Negative for chest pain and palpitations.  Neurological: Positive for dizziness (with sinus/allergies) and headaches (with sinus/allergies).       Objective:   Vitals:   08/07/19 1406  BP: (!) 162/84  Pulse: 69  Resp: 16  Temp: 98.7 F (37.1 C)  SpO2: 99%   BP Readings from Last 3 Encounters:  08/07/19 (!) 162/84   07/24/19 130/62  02/14/19 121/64   Wt Readings from Last 3 Encounters:  08/07/19 157 lb (71.2 kg)  07/24/19 156 lb 3.2 oz (70.9 kg)  02/06/19 159 lb 12.8 oz (72.5 kg)   Body mass index is 26.95 kg/m.   Physical Exam  Constitutional: Appears well-developed and well-nourished. No distress.  HENT:  Head: Normocephalic and atraumatic.  Neck: Neck supple. No tracheal deviation present. No thyromegaly present.  No cervical lymphadenopathy Cardiovascular: Normal rate, regular rhythm and normal heart sounds.   No murmur heard. No carotid bruit .  No edema Pulmonary/Chest: Effort normal and breath sounds normal. No respiratory distress. No has no wheezes. No rales.  Skin: Skin is warm and dry. Not diaphoretic.  Psychiatric: Normal mood and affect. Behavior is normal.      Assessment & Plan:    See Problem List for Assessment and Plan of chronic medical problems.    This visit occurred during the SARS-CoV-2 public health emergency.  Safety protocols were in place, including screening questions prior to the visit, additional usage of staff PPE, and extensive cleaning of exam room while observing appropriate contact time as indicated for disinfecting solutions.

## 2019-08-06 NOTE — Patient Instructions (Addendum)
Make sure you are taking your medications daily.   Blood work was ordered.     Medications reviewed and updated.  Changes include :  Start zetia 10 mg daily  Your prescription(s) have been submitted to your pharmacy. Please take as directed and contact our office if you believe you are having problem(s) with the medication(s).     Please followup in 6 months

## 2019-08-07 ENCOUNTER — Encounter: Payer: Self-pay | Admitting: Internal Medicine

## 2019-08-07 ENCOUNTER — Ambulatory Visit (INDEPENDENT_AMBULATORY_CARE_PROVIDER_SITE_OTHER): Payer: Medicare Other | Admitting: Internal Medicine

## 2019-08-07 ENCOUNTER — Other Ambulatory Visit: Payer: Self-pay

## 2019-08-07 VITALS — BP 162/84 | HR 69 | Temp 98.7°F | Resp 16 | Ht 64.0 in | Wt 157.0 lb

## 2019-08-07 DIAGNOSIS — I10 Essential (primary) hypertension: Secondary | ICD-10-CM | POA: Diagnosis not present

## 2019-08-07 DIAGNOSIS — R7303 Prediabetes: Secondary | ICD-10-CM

## 2019-08-07 DIAGNOSIS — E7849 Other hyperlipidemia: Secondary | ICD-10-CM | POA: Diagnosis not present

## 2019-08-07 DIAGNOSIS — G3184 Mild cognitive impairment, so stated: Secondary | ICD-10-CM | POA: Diagnosis not present

## 2019-08-07 LAB — COMPREHENSIVE METABOLIC PANEL
ALT: 13 U/L (ref 0–35)
AST: 15 U/L (ref 0–37)
Albumin: 4 g/dL (ref 3.5–5.2)
Alkaline Phosphatase: 78 U/L (ref 39–117)
BUN: 14 mg/dL (ref 6–23)
CO2: 30 mEq/L (ref 19–32)
Calcium: 10 mg/dL (ref 8.4–10.5)
Chloride: 102 mEq/L (ref 96–112)
Creatinine, Ser: 0.72 mg/dL (ref 0.40–1.20)
GFR: 97.85 mL/min (ref >60.00)
Glucose, Bld: 84 mg/dL (ref 70–99)
Potassium: 4 mEq/L (ref 3.5–5.1)
Sodium: 136 mEq/L (ref 135–145)
Total Bilirubin: 0.3 mg/dL (ref 0.2–1.2)
Total Protein: 7.3 g/dL (ref 6.0–8.3)

## 2019-08-07 LAB — HEMOGLOBIN A1C: Hgb A1c MFr Bld: 6 % (ref 4.6–6.5)

## 2019-08-07 LAB — LIPID PANEL
Cholesterol: 262 mg/dL — ABNORMAL HIGH (ref 0–200)
HDL: 57.3 mg/dL (ref >39.00)
NonHDL: 204.71
Total CHOL/HDL Ratio: 5
Triglycerides: 340 mg/dL — ABNORMAL HIGH (ref 0.0–149.0)
VLDL: 68 mg/dL — ABNORMAL HIGH (ref 0.0–40.0)

## 2019-08-07 LAB — LDL CHOLESTEROL, DIRECT: Direct LDL: 143 mg/dL

## 2019-08-07 MED ORDER — EZETIMIBE 10 MG PO TABS: 10.0000 mg | ORAL_TABLET | Freq: Every day | ORAL | 1 refills | Status: DC

## 2019-08-07 NOTE — Assessment & Plan Note (Addendum)
Chronic BP elevated here today - she did not take her meds today but states she takes it daily  Stressed taking her meds daily No change in meds - BP looks like it has been well controlled recently Recheck at a nurse visit in a couple weeks when she comes in for a vaccine cmp

## 2019-08-07 NOTE — Assessment & Plan Note (Signed)
Following with Dr Delice Lesch Not on any medication Doing some exercise

## 2019-08-07 NOTE — Assessment & Plan Note (Addendum)
Chronic Check lipid panel  Start zetia daily Regular exercise and healthy diet encouraged

## 2019-08-07 NOTE — Assessment & Plan Note (Signed)
Chronic Check a1c Low sugar / carb diet Stressed regular exercise  

## 2019-08-08 ENCOUNTER — Telehealth: Payer: Self-pay

## 2019-08-08 NOTE — Telephone Encounter (Signed)
New message    Calling for test results  

## 2019-08-09 ENCOUNTER — Other Ambulatory Visit: Payer: Self-pay | Admitting: Internal Medicine

## 2019-08-09 DIAGNOSIS — D229 Melanocytic nevi, unspecified: Secondary | ICD-10-CM

## 2019-08-09 NOTE — Telephone Encounter (Signed)
Pt aware of results 

## 2019-08-13 ENCOUNTER — Encounter: Payer: Self-pay | Admitting: Counselor

## 2019-08-13 ENCOUNTER — Ambulatory Visit (INDEPENDENT_AMBULATORY_CARE_PROVIDER_SITE_OTHER): Payer: Medicare Other | Admitting: Counselor

## 2019-08-13 ENCOUNTER — Ambulatory Visit: Payer: Medicare Other | Admitting: Psychology

## 2019-08-13 ENCOUNTER — Other Ambulatory Visit: Payer: Self-pay

## 2019-08-13 DIAGNOSIS — F01A0 Vascular dementia, mild, without behavioral disturbance, psychotic disturbance, mood disturbance, and anxiety: Secondary | ICD-10-CM

## 2019-08-13 DIAGNOSIS — F09 Unspecified mental disorder due to known physiological condition: Secondary | ICD-10-CM

## 2019-08-13 DIAGNOSIS — F0150 Vascular dementia without behavioral disturbance: Secondary | ICD-10-CM | POA: Diagnosis not present

## 2019-08-13 NOTE — Progress Notes (Signed)
[facility]  Patient Name: Karen Evans MRN: [id number] Date of Birth: Oct 24, 1950 Age: 69 y.o. Education: 14 years  Referral Circumstances and Background Information  Karen Evans is a 69 y.o., right-hand dominant, married woman with a history of hypertension, hyperlipidemia, and thinking and memory problems over the past several years. She is followed by Dr. Delice Lesch who referred her here for cognitive assessment. She was a difficult historian and presented to the visit alone, she doesn't have family members who are particularly familiar with her functioning to involve in the appointment. She initially stated that she has been having memory problems since 2019 but then on further discussion stated that she actually doesn't think she's really having memory problems at all. Apparently, she told her PCP Dr. Quay Burow that she was placing things on her car and then driving off, causing Dr. Quay Burow to become concerned and refer her to neurological care. Today, she is saying that she had these problems because she wasn't sleeping and eating well at that time and she feels like she is doing better now. She stated that if she repeats what she hears, then she can remember it.   On review of cognitive systems and functioning, the patient specifically denied any problems keeping track of appointments, with leaving the stove on with cooking, or with managing her medications. She admits that she paid her phone bill three times, although she said that is because she has two phone bills and didn't pay attention to the account numbers. With respect to mood, the patient stated that she feels like she has been doing well. She is doing more things to occupy her time, such as coloring, reading, and drawing and she enjoys those. She stated that her energy is good and she feels like it is better than it used to be. She reported that she is sleeping well, but then she also said that she  only sleeps 6 hours a night. It sounds like she takes naps during the day and is used to an alternative schedule because she always worked second shift. She is still driving and denied that she is having any issues or getting lost. She may not remember the way somewhere if she hasn't gone there in a long time, which is understandable.    Past Medical History and Review of Relevant Studies   Patient Active Problem List   Diagnosis Date Noted  . Encounter for orthopedic follow-up care 11/06/2018  . Medication reaction, subsequent encounter 09/25/2018  . Mild cognitive impairment 03/20/2018  . Osteopenia 07/25/2017  . Pain of left hand 07/12/2017  . Prediabetes 10/27/2015  . History of colonic polyps 10/12/2013  . Constipation 10/12/2013  . Dyspnea 07/23/2013  . Hyperlipidemia 03/29/2011  . GERD (gastroesophageal reflux disease) 02/18/2011  . 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 VITAMIN D DEFICIENCY 03/26/2010  . IRRITABLE BOWEL SYNDROME 04/25/2008  . HIATAL HERNIA 04/24/2008  . RHINITIS 03/14/2008  . History of uterine fibroid 12/29/2007  . Goiter 12/29/2007  . 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  . 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 The extent of her leukoaraiosis is enough to contribute to dementia but borderline as a sole cause, in my opinion. Her brain volume and morphology appears fairly normal for her age, with  only mild changes.   Dr. Amparo Bristol notes were reviewed and are appreciated. I see that the patient had an essentially normal exam at her last visit.   Current Outpatient Medications  Medication Sig Dispense Refill  . acetaminophen (TYLENOL) 500 MG tablet Take 250-500 mg by mouth every 6 (six) hours as needed for  headache (pain).    Marland Kitchen atenolol (TENORMIN) 50 MG tablet TAKE 1 TABLET BY MOUTH ONCE DAILY IN THE MORNING 90 tablet 3  . Calcium Carbonate-Vitamin D (CALTRATE 600+D PO) Take 1 tablet by mouth daily.     . cholecalciferol (VITAMIN D) 1000 units tablet Take 1,000 Units by mouth daily.    Marland Kitchen ezetimibe (ZETIA) 10 MG tablet Take 1 tablet (10 mg total) by mouth daily. 90 tablet 1  . famotidine (PEPCID) 10 MG tablet Take 10 mg by mouth 2 (two) times daily.     . hydrocortisone (ANUSOL-HC) 25 MG suppository Place 25 mg rectally as needed for hemorrhoids or anal itching.    . irbesartan (AVAPRO) 150 MG tablet TAKE 1 TABLET BY MOUTH  DAILY 90 tablet 3  . Lactase (LACTAID PO) Take 1 tablet by mouth 2 (two) times daily as needed (stomach upset/ gas).     . Multiple Vitamin (MULTIVITAMIN WITH MINERALS) TABS tablet Take 1 tablet by mouth daily.     No current facility-administered medications for this visit.    Family History  Problem Relation Age of Onset  . Lymphoma Father   . Diabetes Mother   . Hypertension Mother   . Heart disease Mother   . Penile cancer Paternal Grandfather   . Diabetes Brother        CAD; Dialysis  . Hypertension Brother   . Heart attack Other        1/2 maternal aunts X 2  . Rectal cancer Paternal Grandmother   . Colon cancer Maternal Grandmother   . Cancer Other   . COPD Neg Hx   . Esophageal cancer Neg Hx   . Stomach cancer Neg Hx   . Stroke Neg Hx    There is no  family history of dementia. I asked specifically about her mother, who she previously stated repeats herself, but she said "her memory is better than mine." There is no  family history of psychiatric illness.  Psychosocial History  Developmental, Educational and Employment History: The patient stated that she was a good student who was never held back and earned decent grades. That changed when she went to college, at [employer] in [place]. She wasn't able to handle the academic coursework, she  wanted to study art. She got tutoring and the like but it didn't work for her. She ended up leaving after her sophomore year. 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    Psychiatric History: The patient has some limited involvement in counseling back when she was in college. She denied any involvement as an adult. She has never taken antidepressant medication according to her.   Substance Use History: The patient does not currently consume alcohol and doesn't smoke. She denied any illicit substance use.   Relationship History and Living Cimcumstances: The patient lives independently. She has never been married. She has no children. She stated that she does have a social support network, although she hasn't seen many people since [place]. She talks to her mother every day and has other family members she talks  to occasionally.    Mental Status and Behavioral Observations  Sensorium/Arousal: The patient's level of arousal was awake and alert.  Orientation: The patient was fully oriented to person, place, time, and date.  Appearance: The patient was dressed in appropriate, casual clothing Behavior: The patient presented as somewhat anxious initially and stated that she was anxious about the appointment. That decreased as the appointment progressed.  Speech/language: The patient's speech was normal in rate, rhythm, volume, and prosody Gait/Posture: Deferred to neurology Movement: No overt signs/symptoms of movement disorder presented such as bradykinesia, tremor, masked facies, etc.  Social Comportment: The patient was appropriate although presented as somewhat lackadaisical about her memory problems and seemed entirely unconcerned.  Mood: "Good" Affect: Euthymic Thought process/content: The patient's thought process was a bit meandering and she often had a difficult time answering questions in a consistent manner.  Nevertheless, with some scaffolding and support she was able to supply a reasonably detailed personal history and timeline.  Safety: No safety concerns were identified at today's encounter, such as thoughts of harming self or others.  Insight: Unclear  Montreal Cognitive Assessment  08/13/2019 12/20/2018 06/07/2018  Visuospatial/ Executive (0/5) 2 4 4   Naming (0/3) 3 3 3   Attention: Read list of digits (0/2) 0 1 2  Attention: Read list of letters (0/1) 0 1 1  Attention: Serial 7 subtraction starting at 100 (0/3) 0 1 0  Language: Repeat phrase (0/2) 0 2 2  Language : Fluency (0/1) 0 0 0  Abstraction (0/2) 1 2 0  Delayed Recall (0/5) 4 0 3  Orientation (0/6) 6 6 6   Total 16 20 21   Adjusted Score (based on education) 16 - -   Test Procedures  Wide Range Achievement Test - 4   Word Reading Wechsler Adult Intelligence Scale - IV  Digit Span  Arithmetic  Symbol Search  Coding Repeatable Battery for the Assessment of Neuropsychological Status (Form A) A Random Letter Test The Dot Counting Test Controlled Oral Word Association (F-A-S) Semantic Fluency (Animals) Trail Making Test A & B Modified [place] Card Sorting Test Patient Health Questionnaire - 9  GAD-7  Plan  Anggie Brandin Adorno was seen for a psychiatric diagnostic evaluation and neuropsychological testing. On initial review of her test data, I think that premorbid issues are likely significantly coloring her performance on testing. She also disclosed that she had testing in school, because "they thought something was wrong with me," but then denied that anyone suspected a learning disorder (the testing that she described sounded like achievement-related testing that would be presumably ordered due to suspicion of a learning disorder). I think she is having no more than a mild level of cognitive impairment, she does have some decline in her MoCA test performance over the years. Full and complete note with impressions, recommendations,  and interpretation of test data to follow.   Viviano Simas Nicole Kindred, PsyD, [place] Clinical Neuropsychologist  Informed Consent and Coding/Compliance  Risks and benefits of the evaluation were discussed with the patient as were the limits of confidentiality. I conducted a clinical interview and neuropsychological testing (more than two tests) with Hollice Gong P Repetto and Milana Kidney, B.S. (Technician) assisted me in administering additional test procedures. The patient was able to tolerate the testing procedures and the patient (and/or family if applicable) is likely to benefit from further follow up to receive the diagnosis and treatment recommendations, which will be rendered at the next encounter. Billing below reflects technician time, my direct face-to-face time with the patient,  time spent in test administration, and time spent in professional activities including but not limited to: neuropsychological test interpretation, integration of neuropsychological test data with clinical history, report preparation, treatment planning, care coordination, and review of diagnostically pertinent medical history or studies.   Services associated with this encounter: Clinical Interview [id number]) plus 60 minutes [id number]; Neuropsychological Evaluation by Professional)  120 minutes [id number]; Neuropsychological Evaluation by Professional, Adl.) 30 minutes [id number]; Test Administration by Professional) 30 minutes [id number]; Neuropsychological Testing by Technician) 95 minutes [id number]; Neuropsychological Testing by Technician, Adl.)

## 2019-08-13 NOTE — Progress Notes (Signed)
   Psychometrist Note   Cognitive testing was administered to [name] by Karen Evans, B.S. (Technician) under the supervision of Karen Evans, Psy.D., ABN. Karen Evans was able to tolerate all test procedures. Dr. Nicole Evans met with the patient as needed to manage any emotional reactions to the testing procedures (if applicable). Rest breaks were offered.    The battery of tests administered was selected by Dr. Nicole Evans with consideration to the patient's current level of functioning, the nature of her symptoms, emotional and behavioral responses during the interview, level of literacy, observed level of motivation/effort, and the nature of the referral question. This battery was communicated to the psychometrist. Communication between Dr. Nicole Evans and the psychometrist was ongoing throughout the evaluation and Dr. Nicole Evans was immediately accessible at all times. Dr. Nicole Evans provided supervision to the technician on the date of this service, to the extent necessary to assure the quality of all services provided.    Karen Evans will return in approximately one week for an interactive feedback session with Dr. Nicole Evans, at which time female test performance, clinical impressions, and treatment recommendations will be reviewed in detail. The patient understands she can contact our office should she require our assistance before this time.   A total of 125 minutes of billable time were spent with Karen Evans by the technician, including test administration and scoring time. Billing for these services is reflected in Dr. Les Pou note.   This note reflects time spent with the psychometrician and does not include test scores, clinical history, or any interpretations made by Dr. Nicole Evans. The full report will follow in a separate note.

## 2019-08-15 ENCOUNTER — Encounter: Payer: Self-pay | Admitting: Counselor

## 2019-08-15 NOTE — Progress Notes (Signed)
[facility]  Patient Name: Karen Evans MRN: [id number] Date of Birth: Jan 17, 1951 Age: 69 y.o. Education: 14 years  Measurement properties of test scores: IQ, Index, and Standard Scores (SS): Mean = 100; Standard Deviation = 15 Scaled Scores (Ss): Mean = 10; Standard Deviation = 3 Z scores (Z): Mean = 0; Standard Deviation = 1 T scores (T); Mean = 50; Standard Deviation = 10  TEST SCORES:    Note: This summary of test scores accompanies the interpretive report and should not be considered in isolation without reference to the appropriate sections in the text. Test scores are relative to age, gender, and educational history as available and appropriate.   Performance Validity        A Random Letter Test Raw Descriptor      Errors 0 [place] Expectation      The Dot Counting Test: Raw Descriptor      E-Score 17 Below Expectation      Embedded Measures: Raw Descriptor      RBANS Effort Index: 3 Borderline      WAIS-IV Reliable Digit Span: 7 Borderline      WAIS-IV Reliable Digit Span Revised 10 Below Expectation      Expected Functioning        Wide Range Achievement Test (Word Reading): Standard/Scaled Score Percentile       Word Reading 85 16      Reynolds Intellectual Screening Test Standard/T-score Percentile      Guess What 40 16      Odd Item Out 50 50  RIST Index 93 33      Cognitive Testing        RBANS, Form : Standard/Scaled Score Percentile  Total Score 71 3  Immediate Memory 73 4      List Learning 4 2      Story Memory 6 9  Visuospatial/Constructional 72 3      Figure Copy 8 25      Line Orientation --- <2  Language 82 12      Picture Naming --- 51-75      Semantic Fluency 3 1  Attention 68 2      Digit Span 4 2      Coding 6 9  Delayed Memory 92 30      List Recall --- 10-16      List Recognition --- 26-50      Story Recall 9 37      Figure Recall 5 5      Wechsler Adult Intelligence Scale - IV:  Standard/Scaled Score Percentile  Working Memory Index 71 3      Digit Span 6 9          Digit Span Forward 9 37          Digit Span Backward 5 5          Digit Span Sequencing 7 16      Arithmetic 4 2  Processing Speed Index 97 42      Symbol Search 7 16      Coding 12 75      Verbal Fluency: T-score Percentile      Controlled Oral Word Association (F-A-S) 36 8      Semantic Fluency (Animals) 47 38      Trail Making Test: T-Score Percentile      Part A 44 27      Part B 24 <1      Modified  [place] Systems developer (MWCST): Standard/T-Score Percentile      Number of Categories Correct 32 5      Number of Perseverative Errors 41 18      Number of Total Errors 41 18      Percent Perseverative Errors 48 42  Executive Function Composite 78 7      [place] Diagnostic Aphasia Exam: Raw Score Scaled Score      Complex Ideational Material 7 2      Rating Scales         Raw Score Descriptor  Patient Health Questionnaire - 9 0 Minimal  GAD-7 2 Minimal    Peter V. Nicole Kindred PsyD, [employer] Clinical Neuropsychologist

## 2019-08-16 NOTE — Progress Notes (Signed)
Karen Evans  Patient Name: Karen Evans MRN: [id number] Date of Birth: 1951-02-07 Age: 69 y.o. Education: 14 years  Clinical Impressions  Karen Evans is a 69 y.o., right-hand dominant, single woman with a history of hypertension, hyperlipidemia, and thinking and memory problems since 2019. She is not particularly concerned; she told her PCP Dr. Quay Burow that she was placing things on her car and driving off forgetting them, which caused a referral to Evans, and she has since been referred here for evaluation in the service of diagnostic clarification. On neuropsychological testing, there is suggestion of performance validity issues, rendering findings a very conservative estimate of Karen Evans's actual abilities. Within those constraints she demonstrated very weak attention performance and difficulties with encoding of verbal information. She remembered structured information well and did well on recognition testing and thus does not have memory storage problems. Executive findings were mixed, with several low scores.  The overall impression is one of some mild cognitive decline, mainly with respect to executive control, which seems most likely to be vascular in etiology. I also think that Karen Evans does not test well and that neuropsychological testing and mental status examination are likely to underestimate her function. No specific signs and symptoms of a clinically evident Alzheimer's clinical syndrome at this time.   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 Just like a patient must lie still when undergoing an Xray for the picture to be interpretable, there are a set of assumptions that must be met for neuropsychological test findings to be of meaning. Your performance on  validity measures suggests that we cannot be entirely sure that was the case. Validity problems can be caused by any number of things such as fatigue, difficulty maintaining attention, sensory impairments, idiosyncratic approaches to test taking, or misunderstanding test instructions.   In your case, you may simply not test well. You stated that you had testing in college, which makes me think others have noticed this tendency and it is preexisting. Beyond that, I think you do likely have some degree of mild cognitive decline affecting mainly what is called executive control. Much like the conductor of an orchestra coordinates multiple instruments to make music, executive capacities coordinate other lower-order skills (e.g., movement, language, attention) to form complex human behaviors. Individuals with executive control problems are often capable of doing most of the things they did before they were having problems, but they may not do so as effortlessly, efficiently, and consistently. These difficulties often manifest as problems tracking information, multitasking, and paying attention. Executive control problems often result in cognitive inefficiency and can present as "memory problems," because they decrease encoding and spontaneous retrieval of information.   Because you are still functioning well day-to-day, the best diagnosis at this point in time is mild neurocognitive disorder. In your case, I think your mild neurocognitive disorder is likely due to vascular changes in the brain, which were visible on your MRI. This can be conceptualized as "wear and tear" on the blood vessels in the brain and does not necessarily need to get worse so long as you optimize risk factors like high blood pressure, high cholesterol, etc.   Of course, you should continue to follow with Dr. Quay Burow and diligently follow her recommendations for medically controlling your high blood pressure and high cholesterol. In addition to  medical management, there are  also many healthy lifestyle behaviors that may be helpful.   Exercise is one of the best medicines for promoting health and maintaining cognitive fitness at all stages in life. Exercise probably has the largest documented effect on brain health and performance of any intervention. Studies have shown that even previously sedentary individuals who start exercising as late as age 65 show a significant survival benefit as compared to their non-exercising peers. In the [place], the current guidelines are for 30 minutes of moderate exercise per day, but increasing your activity level less than that may also be helpful. You do not have to get your 30 minutes of exercise in one shot and exercising for short periods of time spread throughout the day can be helpful. Go for several walks, learn to dance, or do something else you enjoy that gets your body moving. Of course, if you have an underlying medical condition or there is any question about whether it is safe for you to exercise, you should consult a medical treatment provider prior to beginning exercise.   I would also suggest that you eat a heart healthy diet low in saturated fats and high in healthy raw foods such as vegetables, fruits, whole grains, and healthy sources of protein such as fish. Fatty fish like salmon may be particularly beneficial because it contains Omega 3 fatty acids, which in some studies have been found to contribute to brain health.   The following compensatory strategies may be helpful for managing day-to-day memory symptoms: . Minimize distractions and interruptions to the extent possible. Be an active observer, present-minded, and focus attention. Focus on only one task for a period of time.  . Get organized. Establish routines and stick to them. Make and use checklists.  . Use external memory aids as needed, such as a planner and notebook. Repetition, written reminders, and keeping a calendar of  appointments may be helpful. Marlene Lard a place to keep your keys, wallet, cell phone, and other personal belongings.  . Break down tasks into smaller steps to help get started and to keep from feeling overwhelmed.  . Increase your success learning information by breaking it into manageable chunks, connecting it to previously learned information, or forming associations with what you are trying to remember.   Test Findings  Test scores are summarized in additional documentation associated with this encounter. Test scores are relative to age, gender, and educational history as available and appropriate. There were some concerns about performance validity with one standalone and several embedded validity measures falling at the margin of or below expectations. In the context of these findings, test data are viewed as reflecting a very conservative estimate of the patient's actual abilities.    General Intellectual Functioning/Achievement:  Performance was at the low end of the average range on the Reynolds' Intellectual Screening Test on the overall index. She performed at an average level on the more visually oriented subtest and at the low average level on the more verbally oriented subtest. Single word reading was also a bit weak and fell at a low average level. These findings temper expectations for Ms. Mi's cognitive test performance to some extent.    Attention and Processing Efficiency: Performance was low on measures of attention, with overall ability falling in the unusually low range on the Working Memory Index of the WAIS-IV. She performed at an average level on one measure of digit repetition forward and then at an extremely low level on another analogous indicator, suggesting some variability. Performance  was unusually low on digit repetition backward and low average on digit resequencing in ascending order.   With respect to processing efficiency, performance was variable, but she  achieved a reasonable average range score on the Processing Speed Index of the WAIS-IV for overall performance. Timed number symbol coding was at the high end of the average range on one indicator but then unusually low on another indicator, again suggesting performance variability.   Language: Language findings suggested intact fundamental abilities and difficulties on sensitive fluency measures. Visual object confrontation was within normal limits. By contrast, semantic fluency for fruits and vegetables was extremely low and semantic fluency for animals was average. Performance was unusually low when generating words in response to the letters F-A-S.   Visuospatial Function: Performance fell at an unusually low level on the RBANS visuospatial/constructional index mainly due to difficulties with judgment of line orientation, and I question if that was not due to problems understanding the task. Figure copy, a simpler measure to participate in, was good and fell at an average level.   Learning and Memory: Performance on measures of learning and memory showed weak first trial learning and encoding of information with better recall for structured as opposed to unstructured information and better recognition as opposed to free recall. The profile is viewed as supporting an impression of attentional/executive abilities attenuating memory function.   In the verbal realm, Ms. Wogan demonstrated unusually low immediate recall for a short story and 10-item word list. Delayed recall for the word list was low average and a bit weak but delayed recall for the short story was average. Her recognition for words from the list contained amongst distractor alternate items was average.   In the visual realm, delayed recall for a modestly complex figure was unusually low.  Executive Functions: Significant variability in Ms. Sedlar's performance and her memory profile supports an impression of executive control  difficulties. Apart from that, she performed at an extremely low level when answering a series of yes/no questions involving attending to and then reasoning with verbal information. Alternating sequencing numbers and letters of the alphabet was extremely low. Generation of words in response to the letters F-A-S was unusually low. Performance was unusually low overall on the Executive Function Composite of the [employer], albeit with a low average perseverative errors score.   Rating Scale(s): Ms. Mesmer screened negative for the presence of clinically significant levels of anxiety or depression.   Viviano Simas Nicole Kindred PsyD, [place] Clinical Neuropsychologist

## 2019-08-21 ENCOUNTER — Ambulatory Visit (INDEPENDENT_AMBULATORY_CARE_PROVIDER_SITE_OTHER): Payer: Medicare Other | Admitting: Counselor

## 2019-08-21 ENCOUNTER — Other Ambulatory Visit: Payer: Self-pay

## 2019-08-21 ENCOUNTER — Encounter: Payer: Self-pay | Admitting: Counselor

## 2019-08-21 DIAGNOSIS — F0150 Vascular dementia without behavioral disturbance: Secondary | ICD-10-CM | POA: Diagnosis not present

## 2019-08-21 DIAGNOSIS — F01A0 Vascular dementia, mild, without behavioral disturbance, psychotic disturbance, mood disturbance, and anxiety: Secondary | ICD-10-CM

## 2019-08-21 NOTE — Patient Instructions (Signed)
Your performance and presentation on assessment today were consistent with validity problems. Just like a patient must lie still when undergoing an Xray for the picture to be interpretable, there are a set of assumptions that must be met for neuropsychological test findings to be of meaning. Your performance on validity measures suggests that we cannot be entirely sure that was the case. Validity problems can be caused by any number of things such as fatigue, difficulty maintaining attention, sensory impairments, idiosyncratic approaches to test taking, or misunderstanding test instructions.   In your case, you may simply not test well. You stated that you had testing in college, which makes me think others have noticed this tendency and it is preexisting. Beyond that, I think you do likely have some degree of mild cognitive decline affecting mainly what is called executive control. Much like the conductor of an orchestra coordinates multiple instruments to make music, executive capacities coordinate other lower-order skills (e.g., movement, language, attention) to form complex human behaviors. Individuals with executive control problems are often capable of doing most of the things they did before they were having problems, but they may not do so as effortlessly, efficiently, and consistently. These difficulties often manifest as problems tracking information, multitasking, and paying attention. Executive control problems often result in cognitive inefficiency and can present as "memory problems," because they decrease encoding and spontaneous retrieval of information.   Because you are still functioning well day-to-day, the best diagnosis at this point in time is mild neurocognitive disorder. In your case, I think your mild neurocognitive disorder is likely due to vascular changes in the brain, which were visible on your MRI. This can be conceptualized as "wear and tear" on the blood vessels in the brain and  does not necessarily need to get worse so long as you optimize risk factors like high blood pressure, high cholesterol, etc.   Of course, you should continue to follow with Dr. Quay Burow and diligently follow her recommendations for medically controlling your high blood pressure and high cholesterol. In addition to medical management, there are also many healthy lifestyle behaviors that may be helpful.   Exercise is one of the best medicines for promoting health and maintaining cognitive fitness at all stages in life. Exercise probably has the largest documented effect on brain health and performance of any intervention. Studies have shown that even previously sedentary individuals who start exercising as late as age 85 show a significant survival benefit as compared to their non-exercising peers. In the [place], the current guidelines are for 30 minutes of moderate exercise per day, but increasing your activity level less than that may also be helpful. You do not have to get your 30 minutes of exercise in one shot and exercising for short periods of time spread throughout the day can be helpful. Go for several walks, learn to dance, or do something else you enjoy that gets your body moving. Of course, if you have an underlying medical condition or there is any question about whether it is safe for you to exercise, you should consult a medical treatment provider prior to beginning exercise.   I would also suggest that you eat a heart healthy diet low in saturated fats and high in healthy raw foods such as vegetables, fruits, whole grains, and healthy sources of protein such as fish. Fatty fish like salmon may be particularly beneficial because it contains Omega 3 fatty acids, which in some studies have been found to contribute to brain health.  The following compensatory strategies may be helpful for managing day-to-day memory symptoms:  Minimize distractions and interruptions to the extent possible.  Be an active observer, present-minded, and focus attention. Focus on only one task for a period of time.   Get organized. Establish routines and stick to them. Make and use checklists.   Use external memory aids as needed, such as a planner and notebook. Repetition, written reminders, and keeping a calendar of appointments may be helpful.  Designate a place to keep your keys, wallet, cell phone, and other personal belongings.   Break down tasks into smaller steps to help get started and to keep from feeling overwhelmed.   Increase your success learning information by breaking it into manageable chunks, connecting it to previously learned information, or forming associations with what you are trying to remember.

## 2019-08-21 NOTE — Progress Notes (Signed)
[facility]  I met with Karen Evans to review the findings resulting from her neuropsychological evaluation. Since the last appointment, things have been about the same.Time was spent reviewing the impressions and recommendations that are detailed in the evaluation report. I spent a significant portion of the visit talking with Karen Evans about health behaviors provided her with psychoeducation regarding healthy dietary strategies, the importance of monitoring her blood pressure actively and sharing that data with Dr. Quay Burow, and the importance of exercise. She seemed hesitant to commit to any changes but agrees she could do better with some of those things. Interventions provided during this encounter included motivational enhancement, and other topics as reflected in the patient instructions. I took time to explain the findings and answer all the patient's questions. I encouraged Karen Evans to contact me should she have any further questions or if further follow up is desired.   Current Medications and Medical History   Current Outpatient Medications  Medication Sig Dispense Refill  . acetaminophen (TYLENOL) 500 MG tablet Take 250-500 mg by mouth every 6 (six) hours as needed for headache (pain).    Marland Kitchen atenolol (TENORMIN) 50 MG tablet TAKE 1 TABLET BY MOUTH ONCE DAILY IN THE MORNING 90 tablet 3  . Calcium Carbonate-Vitamin D (CALTRATE 600+D PO) Take 1 tablet by mouth daily.     . cholecalciferol (VITAMIN D) 1000 units tablet Take 1,000 Units by mouth daily.    Marland Kitchen ezetimibe (ZETIA) 10 MG tablet Take 1 tablet (10 mg total) by mouth daily. 90 tablet 1  . famotidine (PEPCID) 10 MG tablet Take 10 mg by mouth 2 (two) times daily.     . hydrocortisone (ANUSOL-HC) 25 MG suppository Place 25 mg rectally as needed for hemorrhoids or anal itching.    . irbesartan (AVAPRO) 150 MG tablet TAKE 1 TABLET BY MOUTH  DAILY 90 tablet 3  . Lactase (LACTAID PO) Take 1  tablet by mouth 2 (two) times daily as needed (stomach upset/ gas).     . Multiple Vitamin (MULTIVITAMIN WITH MINERALS) TABS tablet Take 1 tablet by mouth daily.     No current facility-administered medications for this visit.    Patient Active Problem List   Diagnosis Date Noted  . Encounter for orthopedic follow-up care 11/06/2018  . Medication reaction, subsequent encounter 09/25/2018  . Mild cognitive impairment 03/20/2018  . Osteopenia 07/25/2017  . Pain of left hand 07/12/2017  . Prediabetes 10/27/2015  . History of colonic polyps 10/12/2013  . Constipation 10/12/2013  . Dyspnea 07/23/2013  . Hyperlipidemia 03/29/2011  . GERD (gastroesophageal reflux disease) 02/18/2011  . 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 VITAMIN D DEFICIENCY 03/26/2010  . IRRITABLE BOWEL SYNDROME 04/25/2008  . HIATAL HERNIA 04/24/2008  . RHINITIS 03/14/2008  . History of uterine fibroid 12/29/2007  . Goiter 12/29/2007  . 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  . Essential hypertension 12/29/2007    Mental Status and Behavioral Observations  Karen Evans was available at the scheduled time for this telephonic appointment. She was alert and generally oriented. Her self-reported mood was "good" and her affect as assessed by vocal quality was neutral. Her thought process was logical, linear, and goal-directed. Content was appropriate to the topics discussed.   Plan  Feedback provided regarding the patient's neuropsychological evaluation. Encouraged her to follow closely with Dr. Quay Burow regarding cerebrovascular risk factor management and also to do more herself to minimize risk of further cerebrovascular damage. Karen Evans was encouraged  to contact me if any questions arise or if further follow up is desired.   Viviano Simas Nicole Kindred, PsyD, [employer] Clinical Neuropsychologist  Service(s) Provided at This Encounter: 49 minutes [phone]; Psychotherapy with patient/family)

## 2019-08-22 ENCOUNTER — Ambulatory Visit (INDEPENDENT_AMBULATORY_CARE_PROVIDER_SITE_OTHER): Payer: Medicare Other | Admitting: *Deleted

## 2019-08-22 ENCOUNTER — Other Ambulatory Visit: Payer: Self-pay

## 2019-08-22 ENCOUNTER — Encounter: Payer: Self-pay | Admitting: *Deleted

## 2019-08-22 VITALS — BP 132/80

## 2019-08-22 DIAGNOSIS — Z23 Encounter for immunization: Secondary | ICD-10-CM | POA: Diagnosis not present

## 2019-08-22 DIAGNOSIS — I10 Essential (primary) hypertension: Secondary | ICD-10-CM

## 2019-08-22 NOTE — Progress Notes (Addendum)
Pt came in for her BP check. BP was 132/80. She states she was out of her atenolol last time and that's why her BP was elevated. She is currently taking both Atenolol & Irbesartan now.     Good - no changes needed

## 2019-08-23 DIAGNOSIS — L820 Inflamed seborrheic keratosis: Secondary | ICD-10-CM | POA: Diagnosis not present

## 2019-08-23 DIAGNOSIS — R208 Other disturbances of skin sensation: Secondary | ICD-10-CM | POA: Diagnosis not present

## 2019-08-29 ENCOUNTER — Telehealth: Payer: Self-pay

## 2019-08-29 NOTE — Telephone Encounter (Signed)
New message    The patient calling for test results.   

## 2019-08-29 NOTE — Telephone Encounter (Signed)
Spoke with pt in regards. She had questions about if her thyroid was checked with her last labs. I advised pt that her thyroid level was not checked.

## 2019-10-29 ENCOUNTER — Telehealth: Payer: Self-pay

## 2019-10-29 NOTE — Telephone Encounter (Signed)
New message    The patient has questions regarding cholesterol medication ezetimibe (ZETIA) 10 MG tablet asking the CMA to call her back.

## 2019-10-31 NOTE — Telephone Encounter (Signed)
Patient is calling again requesting a call back.

## 2019-10-31 NOTE — Telephone Encounter (Signed)
Have her stop the medication.  We can discuss other options at her next visit.

## 2019-10-31 NOTE — Telephone Encounter (Signed)
Spoke with patient and info given 

## 2019-10-31 NOTE — Telephone Encounter (Signed)
Spoke with patient today. She states the Zetia is making her drowsy to the point she feels like her head is going to fall over. She reports she was once taking it a night but now feels like it's no longer effective because along with her other meds, it is making her really tired. Would like to know what you advise or if you would send her something else in?

## 2019-12-12 ENCOUNTER — Ambulatory Visit: Payer: Medicare Other | Admitting: Nurse Practitioner

## 2019-12-12 ENCOUNTER — Encounter: Payer: Self-pay | Admitting: Nurse Practitioner

## 2019-12-12 VITALS — BP 146/86 | HR 76 | Ht 64.0 in | Wt 156.0 lb

## 2019-12-12 DIAGNOSIS — K219 Gastro-esophageal reflux disease without esophagitis: Secondary | ICD-10-CM

## 2019-12-12 DIAGNOSIS — Z86010 Personal history of colonic polyps: Secondary | ICD-10-CM | POA: Diagnosis not present

## 2019-12-12 NOTE — Patient Instructions (Signed)
If you are age 67 or older, your body mass index should be between 23-30. Your Body mass index is 26.78 kg/m. If this is out of the aforementioned range listed, please consider follow up with your Primary Care Provider.  If you are age 79 or younger, your body mass index should be between 19-25. Your Body mass index is 26.78 kg/m. If this is out of the aformentioned range listed, please consider follow up with your Primary Care Provider.    Continue to sleep on a bed wedge and go to sleep on an empty stomach.  You may stop taking Pepcid.  Please call if you have recurrent heartburn.  Follow up as needed.  Due to recent changes in healthcare laws, you may see the results of your imaging and laboratory studies on MyChart before your provider has had a chance to review them.  We understand that in some cases there may be results that are confusing or concerning to you. Not all laboratory results come back in the same time frame and the provider may be waiting for multiple results in order to interpret others.  Please give [place] 48 hours in order for your provider to thoroughly review all the results before contacting the office for clarification of your results.

## 2019-12-12 NOTE — Progress Notes (Signed)
IMPRESSION and PLAN:     Karen Evans is a 69 y.o. female with a PMH signficant for, but not necessarily limited to, hyperlipidemia, hypertension, GERD, IBS, adenomatous colon polyps  #GERD --Asymptomatic off pepcid after implementing lifestyle changes discussed at last year's visit. It is reasonable to stop Pepcid at this point --Call if recurrent heartburn. Continue using wedge pillow and going to bed on empty stomach.   # Hx of colon polyps.  -- patient inquiring about when her next colonoscopy is due.  We reviewed the results of last colonoscopy/path reports.  We talked about the new polyp surveillance guidelines.  --Two adenomas < 10 mm on last colonoscopy in 2018. Original recommendation was for 5 year follow up colonoscopy. Based on guideline changes surveillance colonoscopy would now be at 7-10 years.  Grandmother had CRC in her 58's. Will defer to Dr. Hilarie Fredrickson as to when he would like to proceed with surveillance colonoscopy and place on recall list accordingly.   HPI:    Primary GI:  Dr. Hilarie Fredrickson   Chief complaint : talk about GERD medications.   This patient is a 69 yo female last seen here in September 2020.  That time she was having some nocturnal GERD symptoms on twice daily Pepcid.  Review of the records show that she was intolerant to nearly every PPI if not all of them.  We discussed antireflux measures, continued Pepcid and entertained adding Carafate if no improvement  INTERVAL HISTORY:   Patient comes in today wondering if she has to take the Pepcid.  She has not been taking Pepcid nor anything for heartburn on a regular basis.  The only time that she takes Pepcid which is about twice a month is because it was prescribed for her.  She has no heartburn or regurgitation.  She has been using the wedge pillow at night and going to bed on an empty stomach.  No other GI complaints.  She has been eating increased amounts of fruits and vegetables.  Bowels moving okay . She  inquires as to when her next colonoscopy is due.    PREVIOUS ENDOSCOPIC EVALUATIONS / GI studies: Two 4 to 6 mm polyps in the transverse colon, removed with a cold snare. Resected and retrieved. - Nodular mucosa in the distal rectum. Biopsied. - Internal hemorrhoidsolonoscopy March 2018  Path - tubular adenomas  Review of systems:     No chest pain, no SOB, no fevers, no urinary sx   Past Medical History:  Diagnosis Date  . Allergy   . Anemia    PMH of   . Colon polyp   . Crohn's    ileitis/mild; Dr Sharlett Iles  . Duodenitis without mention of hemorrhage 2007   EGD  . Fibroid, uterine    Dr Garwin Brothers; G 0 P 0  . GERD (gastroesophageal reflux disease)   . Goiter    CTS,RUE > LUE  . Heart murmur   . Hiatal hernia 2002   EGD  . Hypertension   . Internal hemorrhoids   . Migraines    when takes vitamins   . Rectal polyp 01/24/2012   TUBULAR ADENOMA  . Reflux esophagitis 2007   EGD  . Stricture and stenosis of esophagus 2007   EGD  . Tubular adenoma of colon   . Ulcer   . Vitamin D deficiency     Patient's surgical history, family medical history, social history, medications and allergies were all reviewed in Epic   Creatinine  clearance cannot be calculated (Patient's most recent lab result is older than the maximum 21 days allowed.)  Current Outpatient Medications  Medication Sig Dispense Refill  . acetaminophen (TYLENOL) 500 MG tablet Take 250-500 mg by mouth every 6 (six) hours as needed for headache (pain).    Marland Kitchen atenolol (TENORMIN) 50 MG tablet TAKE 1 TABLET BY MOUTH ONCE DAILY IN THE MORNING 90 tablet 3  . Calcium Carbonate-Vitamin D (CALTRATE 600+D PO) Take 1 tablet by mouth daily.     . cholecalciferol (VITAMIN D) 1000 units tablet Take 1,000 Units by mouth daily.    . famotidine (PEPCID) 10 MG tablet Take 10 mg by mouth as needed.     . hydrocortisone (ANUSOL-HC) 25 MG suppository Place 25 mg rectally as needed for hemorrhoids or anal itching.    . irbesartan  (AVAPRO) 150 MG tablet TAKE 1 TABLET BY MOUTH  DAILY 90 tablet 3  . Lactase (LACTAID PO) Take 1 tablet by mouth 2 (two) times daily as needed (stomach upset/ gas).     . Multiple Vitamin (MULTIVITAMIN WITH MINERALS) TABS tablet Take 1 tablet by mouth daily.     No current facility-administered medications for this visit.    Filed Weights   12/12/19 1426  Weight: 156 lb (70.8 kg)    Physical Exam:     BP (!) 146/86   Pulse 76   Ht 5\' 4"  (1.626 m)   Wt 156 lb (70.8 kg)   BMI 26.78 kg/m   GENERAL:  Pleasant female in NAD PSYCH: : Cooperative, normal affect CARDIAC:  RRR PULM: Normal respiratory effort, lungs CTA bilaterally, no wheezing ABDOMEN:  Nondistended, soft, nontender. No obvious masses, no hepatomegaly,  normal bowel sounds SKIN:  turgor, no lesions seen Musculoskeletal:  Normal muscle tone, normal strength NEURO: Alert and oriented x 3, no focal neurologic deficits   Karen Evans , NP 12/12/2019, 2:47 PM

## 2019-12-25 NOTE — Progress Notes (Signed)
Addendum: Reviewed and agree with assessment and management plan. Given personal and family history would maintain 5 yr colon surveillance plan, thus 2023 Karen Evans, Lajuan Lines, MD

## 2020-01-07 DIAGNOSIS — L816 Other disorders of diminished melanin formation: Secondary | ICD-10-CM | POA: Diagnosis not present

## 2020-01-07 DIAGNOSIS — L658 Other specified nonscarring hair loss: Secondary | ICD-10-CM | POA: Diagnosis not present

## 2020-01-07 DIAGNOSIS — L820 Inflamed seborrheic keratosis: Secondary | ICD-10-CM | POA: Diagnosis not present

## 2020-02-05 ENCOUNTER — Ambulatory Visit: Payer: Medicare Other | Admitting: Neurology

## 2020-02-07 DIAGNOSIS — F01A0 Vascular dementia, mild, without behavioral disturbance, psychotic disturbance, mood disturbance, and anxiety: Secondary | ICD-10-CM | POA: Insufficient documentation

## 2020-02-07 NOTE — Patient Instructions (Addendum)
Consider getting the covid booster, tetanus and shingles vaccines at your pharmacy.   Blood work was ordered.    All other Health Maintenance issues reviewed.   All recommended immunizations and age-appropriate screenings are up-to-date or discussed.   Medications reviewed and updated.  Changes include :  none   Your prescription(s) have been submitted to your pharmacy. Please take as directed and contact our office if you believe you are having problem(s) with the medication(s).    Please followup in 6 months    Health Maintenance, Female Adopting a healthy lifestyle and getting preventive care are important in promoting health and wellness. Ask your health care provider about:  The right schedule for you to have regular tests and exams.  Things you can do on your own to prevent diseases and keep yourself healthy. What should I know about diet, weight, and exercise? Eat a healthy diet   Eat a diet that includes plenty of vegetables, fruits, low-fat dairy products, and lean protein.  Do not eat a lot of foods that are high in solid fats, added sugars, or sodium. Maintain a healthy weight Body mass index (BMI) is used to identify weight problems. It estimates body fat based on height and weight. Your health care provider can help determine your BMI and help you achieve or maintain a healthy weight. Get regular exercise Get regular exercise. This is one of the most important things you can do for your health. Most adults should:  Exercise for at least 150 minutes each week. The exercise should increase your heart rate and make you sweat (moderate-intensity exercise).  Do strengthening exercises at least twice a week. This is in addition to the moderate-intensity exercise.  Spend less time sitting. Even light physical activity can be beneficial. Watch cholesterol and blood lipids Have your blood tested for lipids and cholesterol at 67 years of age, then have this test every 5  years. Have your cholesterol levels checked more often if:  Your lipid or cholesterol levels are high.  You are older than 67 years of age.  You are at high risk for heart disease. What should I know about cancer screening? Depending on your health history and family history, you may need to have cancer screening at various ages. This may include screening for:  Breast cancer.  Cervical cancer.  Colorectal cancer.  Skin cancer.  Lung cancer. What should I know about heart disease, diabetes, and high blood pressure? Blood pressure and heart disease  High blood pressure causes heart disease and increases the risk of stroke. This is more likely to develop in people who have high blood pressure readings, are of African descent, or are overweight.  Have your blood pressure checked: ? Every 3-5 years if you are 80-75 years of age. ? Every year if you are 35 years old or older. Diabetes Have regular diabetes screenings. This checks your fasting blood sugar level. Have the screening done:  Once every three years after age 44 if you are at a normal weight and have a low risk for diabetes.  More often and at a younger age if you are overweight or have a high risk for diabetes. What should I know about preventing infection? Hepatitis B If you have a higher risk for hepatitis B, you should be screened for this virus. Talk with your health care provider to find out if you are at risk for hepatitis B infection. Hepatitis C Testing is recommended for:  Everyone born from [birth year] through  [place] with known risk factors for hepatitis C. Sexually transmitted infections (STIs)  Get screened for STIs, including gonorrhea and chlamydia, if: ? You are sexually active and are younger than 67 years of age. ? You are older than 67 years of age and your health care provider tells you that you are at risk for this type of infection. ? Your sexual activity has changed since you were last  screened, and you are at increased risk for chlamydia or gonorrhea. Ask your health care provider if you are at risk.  Ask your health care provider about whether you are at high risk for HIV. Your health care provider may recommend a prescription medicine to help prevent HIV infection. If you choose to take medicine to prevent HIV, you should first get tested for HIV. You should then be tested every 3 months for as long as you are taking the medicine. Pregnancy  If you are about to stop having your period (premenopausal) and you may become pregnant, seek counseling before you get pregnant.  Take 400 to 800 micrograms (mcg) of folic acid every day if you become pregnant.  Ask for birth control (contraception) if you want to prevent pregnancy. Osteoporosis and menopause Osteoporosis is a disease in which the bones lose minerals and strength with aging. This can result in bone fractures. If you are 25 years old or older, or if you are at risk for osteoporosis and fractures, ask your health care provider if you should:  Be screened for bone loss.  Take a calcium or vitamin D supplement to lower your risk of fractures.  Be given hormone replacement therapy (HRT) to treat symptoms of menopause. Follow these instructions at home: Lifestyle  Do not use any products that contain nicotine or tobacco, such as cigarettes, e-cigarettes, and chewing tobacco. If you need help quitting, ask your health care provider.  Do not use street drugs.  Do not share needles.  Ask your health care provider for help if you need support or information about quitting drugs. Alcohol use  Do not drink alcohol if: ? Your health care provider tells you not to drink. ? You are pregnant, may be pregnant, or are planning to become pregnant.  If you drink alcohol: ? Limit how much you use to 0-1 drink a day. ? Limit intake if you are breastfeeding.  Be aware of how much alcohol is in your drink. In the [place] one  drink equals one 12 oz bottle of beer (355 mL), one 5 oz glass of wine (148 mL), or one 1 oz glass of hard liquor (44 mL). General instructions  Schedule regular health, dental, and eye exams.  Stay current with your vaccines.  Tell your health care provider if: ? You often feel depressed. ? You have ever been abused or do not feel safe at home. Summary  Adopting a healthy lifestyle and getting preventive care are important in promoting health and wellness.  Follow your health care provider's instructions about healthy diet, exercising, and getting tested or screened for diseases.  Follow your health care provider's instructions on monitoring your cholesterol and blood pressure. This information is not intended to replace advice given to you by your health care provider. Make sure you discuss any questions you have with your health care provider. Document Revised: 04/26/2018 Document Reviewed: 04/26/2018 Elsevier Patient Education  2020 [employer].

## 2020-02-07 NOTE — Progress Notes (Signed)
Subjective:    Patient ID: Karen Evans, female    DOB: [birth year], 69 y.o.   MRN: [id number]  HPI She is here for a physical exam.   She had a couple of episodes of diarrhea yesterday.    Medications and allergies reviewed with patient and updated if appropriate.  Patient Active Problem List   Diagnosis Date Noted  . 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 Medication reaction, subsequent encounter 09/25/2018  . Osteopenia 07/25/2017  . Prediabetes 10/27/2015  . History of colonic polyps 10/12/2013  . Dyspnea 07/23/2013  . Hyperlipidemia 03/29/2011  . GERD (gastroesophageal reflux disease) 02/18/2011  . 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 Vitamin D deficiency 03/26/2010  . IRRITABLE BOWEL SYNDROME 04/25/2008  . HIATAL HERNIA 04/24/2008  . RHINITIS 03/14/2008  . History of uterine fibroid 12/29/2007  . Goiter 12/29/2007  . 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  . Essential hypertension 12/29/2007    Current Outpatient Medications on File Prior to Visit  Medication Sig Dispense Refill  . acetaminophen (TYLENOL) 500 MG tablet Take 250-500 mg by mouth every 6 (six) hours as needed for headache (pain).    . Calcium Carbonate-Vitamin D (CALTRATE 600+D PO) Take 1 tablet by mouth daily.     . cholecalciferol (VITAMIN D) 1000 units tablet Take 1,000 Units by mouth daily.    . hydrocortisone (ANUSOL-HC) 25 MG suppository Place 25 mg rectally as needed for hemorrhoids or anal itching.    . Lactase (LACTAID PO) Take 1 tablet by mouth 2 (two) times daily as needed (stomach upset/ gas).     . Multiple Vitamin (MULTIVITAMIN WITH MINERALS) TABS tablet Take 1 tablet by mouth daily.     No current facility-administered medications on file prior to visit.    Past Medical History:  Diagnosis Date  . Allergy   . Anemia    PMH of   . Colon polyp   . Crohn's    ileitis/mild; Dr Sharlett Iles  . Duodenitis without mention of hemorrhage 2007    EGD  . Fibroid, uterine    Dr Garwin Brothers; G 0 P 0  . GERD (gastroesophageal reflux disease)   . Goiter    CTS,RUE > LUE  . Heart murmur   . Hiatal hernia 2002   EGD  . Hypertension   . Internal hemorrhoids   . Migraines    when takes vitamins   . Rectal polyp 01/24/2012   TUBULAR ADENOMA  . Reflux esophagitis 2007   EGD  . Stricture and stenosis of esophagus 2007   EGD  . Tubular adenoma of colon   . Ulcer   . Vitamin D deficiency     Past Surgical History:  Procedure Laterality Date  . COLONOSCOPY     polyps;granular ileitis 2006;2007 normal, Dr Sharlett Iles  . HEMORRHOID SURGERY    . OPERATIVE HYSTEROSCOPY  08-05-04   Dr Garwin Brothers  . OVARIAN CYST REMOVAL    . POLYPECTOMY    . UPPER GASTROINTESTINAL ENDOSCOPY      Social History   Socioeconomic History  . Marital status: Single    Spouse name: Not on file  . Number of children: 0  . Years of education: Not on file  . Highest education level: Not on file  Occupational History  . Occupation: TESTER    Employer: [employer]  Tobacco Use  . Smoking status: Never Smoker  . Smokeless tobacco: Never Used  Vaping Use  . Vaping Use: Never used  Substance  and Sexual Activity  . Alcohol use: No  . Drug use: No  . Sexual activity: Not on file  Other Topics Concern  . Not on file  Social History Narrative   Right handed      Some College   Social Determinants of Health   Financial Resource Strain: Low Risk   . Difficulty of Paying Living Expenses: Not hard at all  Food Insecurity: No Food Insecurity  . Worried About Charity fundraiser in the Last Year: Never true  . Ran Out of Food in the Last Year: Never true  Transportation Needs: No Transportation Needs  . Lack of Transportation (Medical): No  . Lack of Transportation (Non-Medical): No  Physical Activity: Insufficiently Active  . Days of Exercise per Week: 3 days  . Minutes of Exercise per Session: 30 min  Stress: Stress Concern Present  . Feeling of  Stress : To some extent  Social Connections: Socially Isolated  . Frequency of Communication with Friends and Family: More than three times a week  . Frequency of Social Gatherings with Friends and Family: Never  . Attends Religious Services: Never  . Active Member of Clubs or Organizations: No  . Attends Archivist Meetings: Never  . Marital Status: Never married    Family History  Problem Relation Age of Onset  . Lymphoma Father   . Diabetes Mother   . Hypertension Mother   . Heart disease Mother   . Penile cancer Paternal Grandfather   . Diabetes Brother        CAD; Dialysis  . Hypertension Brother   . Heart attack Other        1/2 maternal aunts X 2  . Rectal cancer Paternal Grandmother   . Colon cancer Maternal Grandmother   . Cancer Other   . COPD Neg Hx   . Esophageal cancer Neg Hx   . Stomach cancer Neg Hx   . Stroke Neg Hx     Review of Systems  Constitutional: Negative for chills and fever.  HENT: Positive for sneezing.   Eyes: Positive for visual disturbance (has had some blurry vision - related to allergies).  Respiratory: Positive for cough (related to allergies). Negative for shortness of breath and wheezing.   Cardiovascular: Negative for chest pain, palpitations and leg swelling.  Gastrointestinal: Positive for anal bleeding (occ - hemorrhoidal). Negative for abdominal pain, blood in stool, constipation, diarrhea and nausea.  Genitourinary: Negative for dysuria.  Musculoskeletal: Positive for arthralgias (with change in weather).  Skin: Negative for rash.  Neurological: Negative for dizziness and headaches.  Psychiatric/Behavioral: Negative for dysphoric mood. The patient is not nervous/anxious.        Objective:   Vitals:   02/08/20 1313  BP: 138/82  Pulse: (!) 58  Temp: 98.7 F (37.1 C)  SpO2: 96%   Filed Weights   02/08/20 1313  Weight: 157 lb 12.8 oz (71.6 kg)   Body mass index is 27.09 kg/m.  BP Readings from Last 3  Encounters:  02/08/20 138/82  12/12/19 (!) 146/86  08/22/19 132/80    Wt Readings from Last 3 Encounters:  02/08/20 157 lb 12.8 oz (71.6 kg)  12/12/19 156 lb (70.8 kg)  08/07/19 157 lb (71.2 kg)     Physical Exam Constitutional: She appears well-developed and well-nourished. No distress.  HENT:  Head: Normocephalic and atraumatic.  Right Ear: External ear normal. Normal ear canal and TM Left Ear: External ear normal.  Normal ear canal and TM  Mouth/Throat: Oropharynx is clear and moist.  Eyes: Conjunctivae and EOM are normal.  Neck: Neck supple. No tracheal deviation present. No thyromegaly present.  No carotid bruit  Cardiovascular: Normal rate, regular rhythm and normal heart sounds.   No murmur heard.  No edema. Pulmonary/Chest: Effort normal and breath sounds normal. No respiratory distress. She has no wheezes. She has no rales.  Breast: deferred   Abdominal: Soft. She exhibits no distension. There is no tenderness.  Lymphadenopathy: She has no cervical adenopathy.  Skin: Skin is warm and dry. She is not diaphoretic.  Psychiatric: She has a normal mood and affect. Her behavior is normal.        Assessment & Plan:   Physical exam: Screening blood work    ordered Immunizations  Advised covid booster, flu vaccine deferred, discussed tdap, shingrix Colonoscopy  Up to date  Mammogram  Up to date  Gyn  Up to date  Dexa  Up to date  Eye exams  Up to date  Exercise  Walking about 3 times a week Weight  Ok for age Substance abuse  none  See Problem List for Assessment and Plan of chronic medical problems.   This visit occurred during the SARS-CoV-2 public health emergency.  Safety protocols were in place, including screening questions prior to the visit, additional usage of staff PPE, and extensive cleaning of exam room while observing appropriate contact time as indicated for disinfecting solutions.

## 2020-02-08 ENCOUNTER — Other Ambulatory Visit: Payer: Self-pay

## 2020-02-08 ENCOUNTER — Ambulatory Visit (INDEPENDENT_AMBULATORY_CARE_PROVIDER_SITE_OTHER): Payer: Medicare Other | Admitting: Internal Medicine

## 2020-02-08 ENCOUNTER — Encounter: Payer: Self-pay | Admitting: Internal Medicine

## 2020-02-08 ENCOUNTER — Ambulatory Visit (INDEPENDENT_AMBULATORY_CARE_PROVIDER_SITE_OTHER): Payer: Medicare Other

## 2020-02-08 VITALS — BP 138/82 | HR 58 | Temp 98.7°F | Wt 157.8 lb

## 2020-02-08 DIAGNOSIS — Z0000 Encounter for general adult medical examination without abnormal findings: Secondary | ICD-10-CM | POA: Diagnosis not present

## 2020-02-08 DIAGNOSIS — M85852 Other specified disorders of bone density and structure, left thigh: Secondary | ICD-10-CM | POA: Diagnosis not present

## 2020-02-08 DIAGNOSIS — R7303 Prediabetes: Secondary | ICD-10-CM

## 2020-02-08 DIAGNOSIS — F0150 Vascular dementia without behavioral disturbance: Secondary | ICD-10-CM

## 2020-02-08 DIAGNOSIS — E559 Vitamin D deficiency, unspecified: Secondary | ICD-10-CM

## 2020-02-08 DIAGNOSIS — F01A0 Vascular dementia, mild, without behavioral disturbance, psychotic disturbance, mood disturbance, and anxiety: Secondary | ICD-10-CM

## 2020-02-08 DIAGNOSIS — I10 Essential (primary) hypertension: Secondary | ICD-10-CM | POA: Diagnosis not present

## 2020-02-08 DIAGNOSIS — E7849 Other hyperlipidemia: Secondary | ICD-10-CM

## 2020-02-08 DIAGNOSIS — I999 Unspecified disorder of circulatory system: Secondary | ICD-10-CM

## 2020-02-08 MED ORDER — ATENOLOL 50 MG PO TABS
ORAL_TABLET | ORAL | 1 refills | Status: DC
Start: 2020-02-08 — End: 2020-09-15

## 2020-02-08 MED ORDER — IRBESARTAN 150 MG PO TABS
150.0000 mg | ORAL_TABLET | Freq: Every day | ORAL | 1 refills | Status: DC
Start: 2020-02-08 — End: 2020-09-05

## 2020-02-08 NOTE — Assessment & Plan Note (Signed)
Chronic Check a1c Low sugar / carb diet Stressed regular exercise  

## 2020-02-08 NOTE — Assessment & Plan Note (Signed)
Chronic Intolerant to statins Stressed exercise and healthy diet

## 2020-02-08 NOTE — Addendum Note (Signed)
Addended by: Cresenciano Lick on: 02/08/2020 01:43 PM   Modules accepted: Orders

## 2020-02-08 NOTE — Assessment & Plan Note (Signed)
Chronic Follow with Dr Delice Lesch

## 2020-02-08 NOTE — Assessment & Plan Note (Signed)
Chronic BP well controlled Current regimen effective and well tolerated Continue current medications at current doses cmp  

## 2020-02-08 NOTE — Progress Notes (Signed)
I connected with Keyani Rigdon today by telephone and verified that I am speaking with the correct person using two identifiers. Location patient: home Location provider: work Persons participating in the virtual visit:Amneet Stebbins and [employer], [place].   I discussed the limitations, risks, security and privacy concerns of performing an evaluation and management service by telephone and the availability of in person appointments. I also discussed with the patient that there may be a patient responsible charge related to this service. The patient expressed understanding and verbally consented to this telephonic visit.    Interactive audio and video telecommunications were attempted between this provider and patient, however failed, due to patient having technical difficulties OR patient did not have access to video capability.  We continued and completed visit with audio only.  Some vital signs may be absent or patient reported.   Time Spent with patient on telephone encounter: 30 minutes  Subjective:   Karen Evans is a 69 y.o. female who presents for Medicare Annual (Subsequent) preventive examination.  Review of Systems    No ROS. Medicare Wellness Visit Cardiac Risk Factors include: advanced age (>21men, >65 women);dyslipidemia;family history of premature cardiovascular disease;hypertension     Objective:    There were no vitals filed for this visit. There is no height or weight on file to calculate BMI.  Advanced Directives 02/08/2020 07/24/2019 02/16/2018  Does Patient Have a Medical Advance Directive? Yes Yes Yes  Type of Paramedic of [place];Living will Living will;Healthcare Power of Attorney -  Does patient want to make changes to medical advance directive? No - Patient declined - No - Patient declined  Copy of [place] in Chart? No - copy requested - -    Current Medications (verified) Outpatient Encounter Medications  as of 02/08/2020  Medication Sig  . acetaminophen (TYLENOL) 500 MG tablet Take 250-500 mg by mouth every 6 (six) hours as needed for headache (pain).  Marland Kitchen atenolol (TENORMIN) 50 MG tablet TAKE 1 TABLET BY MOUTH ONCE DAILY IN THE MORNING  . Calcium Carbonate-Vitamin D (CALTRATE 600+D PO) Take 1 tablet by mouth daily.   . cholecalciferol (VITAMIN D) 1000 units tablet Take 1,000 Units by mouth daily.  . famotidine (PEPCID) 10 MG tablet Take 10 mg by mouth as needed.   . hydrocortisone (ANUSOL-HC) 25 MG suppository Place 25 mg rectally as needed for hemorrhoids or anal itching.  . irbesartan (AVAPRO) 150 MG tablet TAKE 1 TABLET BY MOUTH  DAILY  . Lactase (LACTAID PO) Take 1 tablet by mouth 2 (two) times daily as needed (stomach upset/ gas).   . Multiple Vitamin (MULTIVITAMIN WITH MINERALS) TABS tablet Take 1 tablet by mouth daily.   No facility-administered encounter medications on file as of 02/08/2020.    Allergies (verified) Cefprozil, Erythromycin, Esomeprazole magnesium, Penicillins, Sulfonamide derivatives, Amlodipine, Crestor [rosuvastatin calcium], Crestor [rosuvastatin], Erythromycin base, Hydrochlorothiazide, Pravastatin, Sulfa antibiotics, Zetia [ezetimibe], Atorvastatin, and Doxycycline   History: Past Medical History:  Diagnosis Date  . Allergy   . Anemia    PMH of   . Colon polyp   . Crohn's    ileitis/mild; Dr Sharlett Iles  . Duodenitis without mention of hemorrhage 2007   EGD  . Fibroid, uterine    Dr Garwin Brothers; G 0 P 0  . GERD (gastroesophageal reflux disease)   . Goiter    CTS,RUE > LUE  . Heart murmur   . Hiatal hernia 2002   EGD  . Hypertension   . Internal hemorrhoids   .  Migraines    when takes vitamins   . Rectal polyp 01/24/2012   TUBULAR ADENOMA  . Reflux esophagitis 2007   EGD  . Stricture and stenosis of esophagus 2007   EGD  . Tubular adenoma of colon   . Ulcer   . Vitamin D deficiency    Past Surgical History:  Procedure Laterality Date  .  COLONOSCOPY     polyps;granular ileitis 2006;2007 normal, Dr Sharlett Iles  . HEMORRHOID SURGERY    . OPERATIVE HYSTEROSCOPY  08-05-04   Dr Garwin Brothers  . OVARIAN CYST REMOVAL    . POLYPECTOMY    . UPPER GASTROINTESTINAL ENDOSCOPY     Family History  Problem Relation Age of Onset  . Lymphoma Father   . Diabetes Mother   . Hypertension Mother   . Heart disease Mother   . Penile cancer Paternal Grandfather   . Diabetes Brother        CAD; Dialysis  . Hypertension Brother   . Heart attack Other        1/2 maternal aunts X 2  . Rectal cancer Paternal Grandmother   . Colon cancer Maternal Grandmother   . Cancer Other   . COPD Neg Hx   . Esophageal cancer Neg Hx   . Stomach cancer Neg Hx   . Stroke Neg Hx    Social History   Socioeconomic History  . Marital status: Single    Spouse name: Not on file  . Number of children: 0  . Years of education: Not on file  . Highest education level: Not on file  Occupational History  . Occupation: TESTER    Employer: [employer]  Tobacco Use  . Smoking status: Never Smoker  . Smokeless tobacco: Never Used  Vaping Use  . Vaping Use: Never used  Substance and Sexual Activity  . Alcohol use: No  . Drug use: No  . Sexual activity: Not on file  Other Topics Concern  . Not on file  Social History Narrative   Right handed      Some College   Social Determinants of Health   Financial Resource Strain: Low Risk   . Difficulty of Paying Living Expenses: Not hard at all  Food Insecurity: No Food Insecurity  . Worried About Charity fundraiser in the Last Year: Never true  . Ran Out of Food in the Last Year: Never true  Transportation Needs: No Transportation Needs  . Lack of Transportation (Medical): No  . Lack of Transportation (Non-Medical): No  Physical Activity: Insufficiently Active  . Days of Exercise per Week: 3 days  . Minutes of Exercise per Session: 30 min  Stress: Stress Concern Present  . Feeling of Stress : To some  extent  Social Connections: Socially Isolated  . Frequency of Communication with Friends and Family: More than three times a week  . Frequency of Social Gatherings with Friends and Family: Never  . Attends Religious Services: Never  . Active Member of Clubs or Organizations: No  . Attends Archivist Meetings: Never  . Marital Status: Never married    Tobacco Counseling Counseling given: Not Answered   Clinical Intake:  Pre-visit preparation completed: Yes  Pain : No/denies pain     Nutritional Risks: Nausea/ vomitting/ diarrhea (Due to Crohn's Disease) Diabetes: No  How often do you need to have someone help you when you read instructions, pamphlets, or other written materials from your doctor or pharmacy?: 1 - Never What is the last  grade level you completed in school?: [employer], [employer] ([place])  Diabetic? no  Interpreter Needed?: No  Information entered by :: Lisette Abu, LPN   Activities of Daily Living In your present state of health, do you have any difficulty performing the following activities: 02/08/2020  Hearing? N  Vision? N  Difficulty concentrating or making decisions? Y  Walking or climbing stairs? N  Dressing or bathing? N  Doing errands, shopping? N  Preparing Food and eating ? N  Using the Toilet? N  In the past six months, have you accidently leaked urine? N  Do you have problems with loss of bowel control? N  Managing your Medications? N  Managing your Finances? N  Housekeeping or managing your Housekeeping? N  Some recent data might be hidden    Patient Care Team: Binnie Rail, MD as PCP - General (Internal Medicine) Cameron Sprang, MD as Consulting Physician (Neurology)  Indicate any recent Medical Services you may have received from other than [facility] providers in the past year (date may be approximate).     Assessment:   This is a routine wellness examination for Camdyn.  Hearing/Vision screen No exam data  present  Dietary issues and exercise activities discussed: Current Exercise Habits: Home exercise routine, Type of exercise: walking, Time (Minutes): 30, Frequency (Times/Week): 3, Weekly Exercise (Minutes/Week): 90, Intensity: Mild (problems with feet), Exercise limited by: Other - see comments (cognitive impairment)  Goals   None    Depression Screen PHQ 2/9 Scores 02/08/2020 02/08/2020 08/07/2019 03/14/2018 02/16/2018 07/12/2017 04/02/2013  PHQ - 2 Score 0 0 0 0 0 0 0    Fall Risk Fall Risk  02/08/2020 07/24/2019 12/20/2018 03/14/2018 02/16/2018  Falls in the past year? 0 0 0 No No  Number falls in past yr: 0 0 0 - -  Injury with Fall? 0 0 0 - -  Risk for fall due to : No Fall Risks - - - -  Follow up Falls evaluation completed - Falls evaluation completed - -    Any stairs in or around the home? No  If so, are there any without handrails? No  Home free of loose throw rugs in walkways, pet beds, electrical cords, etc? Yes  Adequate lighting in your home to reduce risk of falls? Yes   ASSISTIVE DEVICES UTILIZED TO PREVENT FALLS:  Life alert? No  Use of a cane, walker or w/c? No  Grab bars in the bathroom? No  Shower chair or bench in shower? Yes  Elevated toilet seat or a handicapped toilet? No   TIMED UP AND GO:  Was the test performed? No .  Length of time to ambulate 10 feet: 0 sec.   Gait steady and fast without use of assistive device  Cognitive Function: Diagnosed by Neurology for mild cognitive impairment.  Seen routinely and paper tests done.   Montreal Cognitive Assessment  08/13/2019 12/20/2018 06/07/2018  Visuospatial/ Executive (0/5) 2 4 4   Naming (0/3) 3 3 3   Attention: Read list of digits (0/2) 0 1 2  Attention: Read list of letters (0/1) 0 1 1  Attention: Serial 7 subtraction starting at 100 (0/3) 0 1 0  Language: Repeat phrase (0/2) 0 2 2  Language : Fluency (0/1) 0 0 0  Abstraction (0/2) 1 2 0  Delayed Recall (0/5) 4 0 3  Orientation (0/6) 6 6 6   Total 16 20  21   Adjusted Score (based on education) 16 - -  Immunizations Immunization History  Administered Date(s) Administered  . Influenza,inj,Quad PF,6+ Mos 04/06/2013  . Influenza-Unspecified 02/22/2017  . PFIZER SARS-COV-2 Vaccination 07/09/2019, 07/30/2019  . Pneumococcal Conjugate-13 03/23/2018  . Pneumococcal Polysaccharide-23 08/22/2019  . Td 12/29/2007  . Zoster 04/02/2013    TDAP status: Due, Education has been provided regarding the importance of this vaccine. Advised may receive this vaccine at local pharmacy or Health Dept. Aware to provide a copy of the vaccination record if obtained from local pharmacy or Health Dept. Verbalized acceptance and understanding. Flu Vaccine status: Declined, Education has been provided regarding the importance of this vaccine but patient still declined. Advised may receive this vaccine at local pharmacy or Health Dept. Aware to provide a copy of the vaccination record if obtained from local pharmacy or Health Dept. Verbalized acceptance and understanding. Pneumococcal vaccine status: Up to date Covid-19 vaccine status: Completed vaccines  Qualifies for Shingles Vaccine? Yes   Zostavax completed Yes   Shingrix Completed?: No.    Education has been provided regarding the importance of this vaccine. Patient has been advised to call insurance company to determine out of pocket expense if they have not yet received this vaccine. Advised may also receive vaccine at local pharmacy or Health Dept. Verbalized acceptance and understanding.  Screening Tests Health Maintenance  Topic Date Due  . TETANUS/TDAP  12/28/2017  . INFLUENZA VACCINE  12/16/2019  . DEXA SCAN  07/21/2020  . MAMMOGRAM  04/04/2021  . COLONOSCOPY  07/29/2021  . COVID-19 Vaccine  Completed  . Hepatitis C Screening  Completed  . PNA vac Low Risk Adult  Completed    Health Maintenance  Health Maintenance Due  Topic Date Due  . TETANUS/TDAP  12/28/2017  . INFLUENZA VACCINE   12/16/2019    Colorectal cancer screening: Completed 12/29/2016. Repeat every 5 years Mammogram status: Completed 04/05/2019. Repeat every year Bone Density status: Completed 07/21/2017. Results reflect: Bone density results: OSTEOPENIA. Repeat every 3 years.  Lung Cancer Screening: (Low Dose CT Chest recommended if Age 94-80 years, 30 pack-year currently smoking OR have quit w/in 15years.) does not qualify.   Lung Cancer Screening Referral: no  Additional Screening:  Hepatitis C Screening: does qualify; Completed yes  Vision Screening: Recommended annual ophthalmology exams for early detection of glaucoma and other disorders of the eye. Is the patient up to date with their annual eye exam?  Yes  Who is the provider or what is the name of the office in which the patient attends annual eye exams? Rutherford Guys, MD If pt is not established with a provider, would they like to be referred to a provider to establish care? No .   Dental Screening: Recommended annual dental exams for proper oral hygiene  Community Resource Referral / Chronic Care Management: CRR required this visit?  No   CCM required this visit?  No      Plan:     I have personally reviewed and noted the following in the patient's chart:   . Medical and social history . Use of alcohol, tobacco or illicit drugs  . Current medications and supplements . Functional ability and status . Nutritional status . Physical activity . Advanced directives . List of other physicians . Hospitalizations, surgeries, and ER visits in previous 12 months . Vitals . Screenings to include cognitive, depression, and falls . Referrals and appointments  In addition, I have reviewed and discussed with patient certain preventive protocols, quality metrics, and best practice recommendations. A written personalized care plan for preventive  services as well as general preventive health recommendations were provided to patient.     Sheral Flow, LPN   [id number]   Nurse Notes:  There were no vitals filed for this visit. There is no height or weight on file to calculate BMI. Patient stated that she has no issues with gait or balance; does not use any assistive devices.

## 2020-02-08 NOTE — Assessment & Plan Note (Signed)
Chronic dexa up to date Walking some Taking calcium, vitamin daily conitnue

## 2020-02-08 NOTE — Assessment & Plan Note (Signed)
Chronic Taking vitamin d Check level 

## 2020-02-09 LAB — LIPID PANEL
Cholesterol: 264 mg/dL — ABNORMAL HIGH (ref <200)
HDL: 62 mg/dL (ref >=50)
LDL Cholesterol (Calc): 165 mg/dL (calc) — ABNORMAL HIGH
Non-HDL Cholesterol (Calc): 202 mg/dL (calc) — ABNORMAL HIGH (ref <130)
Total CHOL/HDL Ratio: 4.3 (calc) (ref <5.0)
Triglycerides: 209 mg/dL — ABNORMAL HIGH (ref <150)

## 2020-02-09 LAB — CBC WITH DIFFERENTIAL/PLATELET
Absolute Monocytes: 818 cells/uL (ref 200–950)
Basophils Absolute: 51 cells/uL (ref 0–200)
Basophils Relative: 0.7 %
Eosinophils Absolute: 110 cells/uL (ref 15–500)
Eosinophils Relative: 1.5 %
HCT: 42 % (ref 35.0–45.0)
Hemoglobin: 13.2 g/dL (ref 11.7–15.5)
Lymphs Abs: 2526 cells/uL (ref 850–3900)
MCH: 25.6 pg — ABNORMAL LOW (ref 27.0–33.0)
MCHC: 31.4 g/dL — ABNORMAL LOW (ref 32.0–36.0)
MCV: 81.6 fL (ref 80.0–100.0)
MPV: 11.2 fL (ref 7.5–12.5)
Monocytes Relative: 11.2 %
Neutro Abs: 3796 cells/uL (ref 1500–7800)
Neutrophils Relative %: 52 %
Platelets: 272 10*3/uL (ref 140–400)
RBC: 5.15 10*6/uL — ABNORMAL HIGH (ref 3.80–5.10)
RDW: 13 % (ref 11.0–15.0)
Total Lymphocyte: 34.6 %
WBC: 7.3 10*3/uL (ref 3.8–10.8)

## 2020-02-09 LAB — HEMOGLOBIN A1C
Hgb A1c MFr Bld: 6 % of total Hgb — ABNORMAL HIGH (ref <5.7)
Mean Plasma Glucose: 126 (calc)
eAG (mmol/L): 7 (calc)

## 2020-02-09 LAB — COMPREHENSIVE METABOLIC PANEL
AG Ratio: 1.3 (calc) (ref 1.0–2.5)
ALT: 14 U/L (ref 6–29)
AST: 18 U/L (ref 10–35)
Albumin: 4 g/dL (ref 3.6–5.1)
Alkaline phosphatase (APISO): 74 U/L (ref 37–153)
BUN: 12 mg/dL (ref 7–25)
CO2: 29 mmol/L (ref 20–32)
Calcium: 10.8 mg/dL — ABNORMAL HIGH (ref 8.6–10.4)
Chloride: 103 mmol/L (ref 98–110)
Creat: 0.71 mg/dL (ref 0.50–0.99)
Globulin: 3.1 g/dL (calc) (ref 1.9–3.7)
Glucose, Bld: 68 mg/dL (ref 65–99)
Potassium: 4.7 mmol/L (ref 3.5–5.3)
Sodium: 139 mmol/L (ref 135–146)
Total Bilirubin: 0.5 mg/dL (ref 0.2–1.2)
Total Protein: 7.1 g/dL (ref 6.1–8.1)

## 2020-02-09 LAB — VITAMIN D 25 HYDROXY (VIT D DEFICIENCY, FRACTURES): Vit D, 25-Hydroxy: 38 ng/mL (ref 30–100)

## 2020-02-09 LAB — TSH: TSH: 0.62 mIU/L (ref 0.40–4.50)

## 2020-04-08 DIAGNOSIS — H524 Presbyopia: Secondary | ICD-10-CM | POA: Diagnosis not present

## 2020-04-08 DIAGNOSIS — H25813 Combined forms of age-related cataract, bilateral: Secondary | ICD-10-CM | POA: Diagnosis not present

## 2020-04-08 DIAGNOSIS — H5213 Myopia, bilateral: Secondary | ICD-10-CM | POA: Diagnosis not present

## 2020-04-28 DIAGNOSIS — D225 Melanocytic nevi of trunk: Secondary | ICD-10-CM | POA: Diagnosis not present

## 2020-04-28 DIAGNOSIS — L821 Other seborrheic keratosis: Secondary | ICD-10-CM | POA: Diagnosis not present

## 2020-04-28 DIAGNOSIS — L918 Other hypertrophic disorders of the skin: Secondary | ICD-10-CM | POA: Diagnosis not present

## 2020-08-07 NOTE — Patient Instructions (Signed)
  Blood work was ordered.     Medications changes include :     Your prescription(s) have been submitted to your pharmacy. Please take as directed and contact our office if you believe you are having problem(s) with the medication(s).   A referral was ordered for        Someone from their office will call you to schedule an appointment.    Please followup in 6 months  

## 2020-08-07 NOTE — Progress Notes (Signed)
Subjective:    Patient ID: Karen Evans, female    DOB: 11-08-1950, 70 y.o.   MRN: [id number]  HPI The patient is here for follow up of their chronic medical problems, including htn, hyperlipidemia, prediabetes, MCI  She is taking all of her medications as prescribed.     Medications and allergies reviewed with patient and updated if appropriate.  Patient Active Problem List   Diagnosis Date Noted  . 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 Medication reaction, subsequent encounter 09/25/2018  . Osteopenia 07/25/2017  . Prediabetes 10/27/2015  . History of colonic polyps 10/12/2013  . Dyspnea 07/23/2013  . Hyperlipidemia 03/29/2011  . GERD (gastroesophageal reflux disease) 02/18/2011  . 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 Vitamin D deficiency 03/26/2010  . IRRITABLE BOWEL SYNDROME 04/25/2008  . HIATAL HERNIA 04/24/2008  . RHINITIS 03/14/2008  . History of uterine fibroid 12/29/2007  . Goiter 12/29/2007  . 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  . Essential hypertension 12/29/2007    Current Outpatient Medications on File Prior to Visit  Medication Sig Dispense Refill  . acetaminophen (TYLENOL) 500 MG tablet Take 250-500 mg by mouth every 6 (six) hours as needed for headache (pain).    Marland Kitchen atenolol (TENORMIN) 50 MG tablet TAKE 1 TABLET BY MOUTH ONCE DAILY IN THE MORNING 90 tablet 1  . Calcium Carbonate-Vitamin D (CALTRATE 600+D PO) Take 1 tablet by mouth daily.     . cholecalciferol (VITAMIN D) 1000 units tablet Take 1,000 Units by mouth daily.    . hydrocortisone (ANUSOL-HC) 25 MG suppository Place 25 mg rectally as needed for hemorrhoids or anal itching.    . irbesartan (AVAPRO) 150 MG tablet Take 1 tablet (150 mg total) by mouth daily. 90 tablet 1  . Lactase (LACTAID PO) Take 1 tablet by mouth 2 (two) times daily as needed (stomach upset/ gas).     . Multiple Vitamin (MULTIVITAMIN WITH MINERALS) TABS tablet Take 1 tablet by mouth  daily.     No current facility-administered medications on file prior to visit.    Past Medical History:  Diagnosis Date  . Allergy   . Anemia    PMH of   . Colon polyp   . Crohn's    ileitis/mild; Dr Sharlett Iles  . Duodenitis without mention of hemorrhage 2007   EGD  . Fibroid, uterine    Dr Garwin Brothers; G 0 P 0  . GERD (gastroesophageal reflux disease)   . Goiter    CTS,RUE > LUE  . Heart murmur   . Hiatal hernia 2002   EGD  . Hypertension   . Internal hemorrhoids   . Migraines    when takes vitamins   . Rectal polyp 01/24/2012   TUBULAR ADENOMA  . Reflux esophagitis 2007   EGD  . Stricture and stenosis of esophagus 2007   EGD  . Tubular adenoma of colon   . Ulcer   . Vitamin D deficiency     Past Surgical History:  Procedure Laterality Date  . COLONOSCOPY     polyps;granular ileitis 2006;2007 normal, Dr Sharlett Iles  . HEMORRHOID SURGERY    . OPERATIVE HYSTEROSCOPY  08-05-04   Dr Garwin Brothers  . OVARIAN CYST REMOVAL    . POLYPECTOMY    . UPPER GASTROINTESTINAL ENDOSCOPY      Social History   Socioeconomic History  . Marital status: Single    Spouse name: Not on file  . Number of children: 0  . Years of education: Not  on file  . Highest education level: Not on file  Occupational History  . Occupation: TESTER    Employer: [employer]  Tobacco Use  . Smoking status: Never Smoker  . Smokeless tobacco: Never Used  Vaping Use  . Vaping Use: Never used  Substance and Sexual Activity  . Alcohol use: No  . Drug use: No  . Sexual activity: Not on file  Other Topics Concern  . Not on file  Social History Narrative   Right handed      Some College   Social Determinants of Health   Financial Resource Strain: Low Risk   . Difficulty of Paying Living Expenses: Not hard at all  Food Insecurity: No Food Insecurity  . Worried About Charity fundraiser in the Last Year: Never true  . Ran Out of Food in the Last Year: Never true  Transportation Needs: No  Transportation Needs  . Lack of Transportation (Medical): No  . Lack of Transportation (Non-Medical): No  Physical Activity: Insufficiently Active  . Days of Exercise per Week: 3 days  . Minutes of Exercise per Session: 30 min  Stress: Stress Concern Present  . Feeling of Stress : To some extent  Social Connections: Socially Isolated  . Frequency of Communication with Friends and Family: More than three times a week  . Frequency of Social Gatherings with Friends and Family: Never  . Attends Religious Services: Never  . Active Member of Clubs or Organizations: No  . Attends Archivist Meetings: Never  . Marital Status: Never married    Family History  Problem Relation Age of Onset  . Lymphoma Father   . Diabetes Mother   . Hypertension Mother   . Heart disease Mother   . Penile cancer Paternal Grandfather   . Diabetes Brother        CAD; Dialysis  . Hypertension Brother   . Heart attack Other        1/2 maternal aunts X 2  . Rectal cancer Paternal Grandmother   . Colon cancer Maternal Grandmother   . Cancer Other   . COPD Neg Hx   . Esophageal cancer Neg Hx   . Stomach cancer Neg Hx   . Stroke Neg Hx     Review of Systems     Objective:  There were no vitals filed for this visit. BP Readings from Last 3 Encounters:  02/08/20 138/82  12/12/19 (!) 146/86  08/22/19 132/80   Wt Readings from Last 3 Encounters:  02/08/20 157 lb 12.8 oz (71.6 kg)  12/12/19 156 lb (70.8 kg)  08/07/19 157 lb (71.2 kg)   There is no height or weight on file to calculate BMI.   Physical Exam    Constitutional: Appears well-developed and well-nourished. No distress.  HENT:  Head: Normocephalic and atraumatic.  Neck: Neck supple. No tracheal deviation present. No thyromegaly present.  No cervical lymphadenopathy Cardiovascular: Normal rate, regular rhythm and normal heart sounds.   No murmur heard. No carotid bruit .  No edema Pulmonary/Chest: Effort normal and breath  sounds normal. No respiratory distress. No has no wheezes. No rales.  Skin: Skin is warm and dry. Not diaphoretic.  Psychiatric: Normal mood and affect. Behavior is normal.      Assessment & Plan:    See Problem List for Assessment and Plan of chronic medical problems.    This visit occurred during the SARS-CoV-2 public health emergency.  Safety protocols were in place, including screening questions prior  to the visit, additional usage of staff PPE, and extensive cleaning of exam room while observing appropriate contact time as indicated for disinfecting solutions.    This encounter was created in error - please disregard.

## 2020-08-08 ENCOUNTER — Encounter: Payer: Medicare Other | Admitting: Internal Medicine

## 2020-08-08 DIAGNOSIS — F0150 Vascular dementia without behavioral disturbance: Secondary | ICD-10-CM

## 2020-08-08 DIAGNOSIS — E7849 Other hyperlipidemia: Secondary | ICD-10-CM

## 2020-08-08 DIAGNOSIS — R7303 Prediabetes: Secondary | ICD-10-CM

## 2020-08-08 DIAGNOSIS — I10 Essential (primary) hypertension: Secondary | ICD-10-CM

## 2020-08-10 NOTE — Patient Instructions (Addendum)
  Blood work was ordered.     Medications changes include :   Pravastatin 10 mg daily  Your prescription(s) have been submitted to your pharmacy. Please take as directed and contact our office if you believe you are having problem(s) with the medication(s).    Please followup in 6 months

## 2020-08-10 NOTE — Progress Notes (Signed)
Subjective:    Patient ID: Karen Evans, female    DOB: [birth year], 70 y.o.   MRN: [id number]  HPI The patient is here for follow up of their chronic medical problems, including htn, hyperlipidemia, prediabetes, MCI  She is taking all of her medications as prescribed.    She does a little exercise - exercises at home.     Medications and allergies reviewed with patient and updated if appropriate.  Patient Active Problem List   Diagnosis Date Noted  . 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 Medication reaction, subsequent encounter 09/25/2018  . Osteopenia 07/25/2017  . Prediabetes 10/27/2015  . History of colonic polyps 10/12/2013  . Dyspnea 07/23/2013  . Hyperlipidemia 03/29/2011  . GERD (gastroesophageal reflux disease) 02/18/2011  . 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 Vitamin D deficiency 03/26/2010  . IRRITABLE BOWEL SYNDROME 04/25/2008  . HIATAL HERNIA 04/24/2008  . RHINITIS 03/14/2008  . History of uterine fibroid 12/29/2007  . Goiter 12/29/2007  . 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  . Essential hypertension 12/29/2007    Current Outpatient Medications on File Prior to Visit  Medication Sig Dispense Refill  . acetaminophen (TYLENOL) 500 MG tablet Take 250-500 mg by mouth every 6 (six) hours as needed for headache (pain).    Marland Kitchen atenolol (TENORMIN) 50 MG tablet TAKE 1 TABLET BY MOUTH ONCE DAILY IN THE MORNING 90 tablet 1  . Calcium Carbonate-Vitamin D (CALTRATE 600+D PO) Take 1 tablet by mouth daily.     . cholecalciferol (VITAMIN D) 1000 units tablet Take 1,000 Units by mouth daily.    . hydrocortisone (ANUSOL-HC) 25 MG suppository Place 25 mg rectally as needed for hemorrhoids or anal itching.    . irbesartan (AVAPRO) 150 MG tablet Take 1 tablet (150 mg total) by mouth daily. 90 tablet 1  . Lactase (LACTAID PO) Take 1 tablet by mouth 2 (two) times daily as needed (stomach upset/ gas).     . Multiple Vitamin  (MULTIVITAMIN WITH MINERALS) TABS tablet Take 1 tablet by mouth daily.     No current facility-administered medications on file prior to visit.    Past Medical History:  Diagnosis Date  . Allergy   . Anemia    PMH of   . Colon polyp   . Crohn's    ileitis/mild; Dr Sharlett Iles  . Duodenitis without mention of hemorrhage 2007   EGD  . Fibroid, uterine    Dr Garwin Brothers; G 0 P 0  . GERD (gastroesophageal reflux disease)   . Goiter    CTS,RUE > LUE  . Heart murmur   . Hiatal hernia 2002   EGD  . Hypertension   . Internal hemorrhoids   . Migraines    when takes vitamins   . Rectal polyp 01/24/2012   TUBULAR ADENOMA  . Reflux esophagitis 2007   EGD  . Stricture and stenosis of esophagus 2007   EGD  . Tubular adenoma of colon   . Ulcer   . Vitamin D deficiency     Past Surgical History:  Procedure Laterality Date  . COLONOSCOPY     polyps;granular ileitis 2006;2007 normal, Dr Sharlett Iles  . HEMORRHOID SURGERY    . OPERATIVE HYSTEROSCOPY  08-05-04   Dr Garwin Brothers  . OVARIAN CYST REMOVAL    . POLYPECTOMY    . UPPER GASTROINTESTINAL ENDOSCOPY      Social History   Socioeconomic History  . Marital status: Single    Spouse name: Not on file  .  Number of children: 0  . Years of education: Not on file  . Highest education level: Not on file  Occupational History  . Occupation: TESTER    Employer: [employer]  Tobacco Use  . Smoking status: Never Smoker  . Smokeless tobacco: Never Used  Vaping Use  . Vaping Use: Never used  Substance and Sexual Activity  . Alcohol use: No  . Drug use: No  . Sexual activity: Not on file  Other Topics Concern  . Not on file  Social History Narrative   Right handed      Some College   Social Determinants of Health   Financial Resource Strain: Low Risk   . Difficulty of Paying Living Expenses: Not hard at all  Food Insecurity: No Food Insecurity  . Worried About Charity fundraiser in the Last Year: Never true  . Ran Out of  Food in the Last Year: Never true  Transportation Needs: No Transportation Needs  . Lack of Transportation (Medical): No  . Lack of Transportation (Non-Medical): No  Physical Activity: Insufficiently Active  . Days of Exercise per Week: 3 days  . Minutes of Exercise per Session: 30 min  Stress: Stress Concern Present  . Feeling of Stress : To some extent  Social Connections: Socially Isolated  . Frequency of Communication with Friends and Family: More than three times a week  . Frequency of Social Gatherings with Friends and Family: Never  . Attends Religious Services: Never  . Active Member of Clubs or Organizations: No  . Attends Archivist Meetings: Never  . Marital Status: Never married    Family History  Problem Relation Age of Onset  . Lymphoma Father   . Diabetes Mother   . Hypertension Mother   . Heart disease Mother   . Penile cancer Paternal Grandfather   . Diabetes Brother        CAD; Dialysis  . Hypertension Brother   . Heart attack Other        1/2 maternal aunts X 2  . Rectal cancer Paternal Grandmother   . Colon cancer Maternal Grandmother   . Cancer Other   . COPD Neg Hx   . Esophageal cancer Neg Hx   . Stomach cancer Neg Hx   . Stroke Neg Hx     Review of Systems  Constitutional: Negative for fever.  Respiratory: Positive for cough (at night). Negative for shortness of breath and wheezing.   Cardiovascular: Negative for chest pain, palpitations and leg swelling.  Gastrointestinal:       No gerd  Neurological: Positive for dizziness (sinus related) and headaches.       Objective:   Vitals:   08/11/20 1408  BP: 118/68  Pulse: (!) 57  Temp: 98.6 F (37 C)  SpO2: 97%   BP Readings from Last 3 Encounters:  08/11/20 118/68  02/08/20 138/82  12/12/19 (!) 146/86   Wt Readings from Last 3 Encounters:  08/11/20 152 lb (68.9 kg)  02/08/20 157 lb 12.8 oz (71.6 kg)  12/12/19 156 lb (70.8 kg)   Body mass index is 27.8 kg/m.    Physical Exam    Constitutional: Appears well-developed and well-nourished. No distress.  HENT:  Head: Normocephalic and atraumatic.  Neck: Neck supple. No tracheal deviation present. No thyromegaly present.  No cervical lymphadenopathy Cardiovascular: Normal rate, regular rhythm and normal heart sounds.   No murmur heard. No carotid bruit .  No edema Pulmonary/Chest: Effort normal and breath sounds  normal. No respiratory distress. No has no wheezes. No rales.  Skin: Skin is warm and dry. Not diaphoretic.  Psychiatric: Normal mood and affect. Behavior is normal.      Assessment & Plan:    See Problem List for Assessment and Plan of chronic medical problems.    This visit occurred during the SARS-CoV-2 public health emergency.  Safety protocols were in place, including screening questions prior to the visit, additional usage of staff PPE, and extensive cleaning of exam room while observing appropriate contact time as indicated for disinfecting solutions.

## 2020-08-11 ENCOUNTER — Ambulatory Visit (INDEPENDENT_AMBULATORY_CARE_PROVIDER_SITE_OTHER): Payer: Medicare Other | Admitting: Internal Medicine

## 2020-08-11 ENCOUNTER — Other Ambulatory Visit: Payer: Self-pay

## 2020-08-11 ENCOUNTER — Encounter: Payer: Self-pay | Admitting: Internal Medicine

## 2020-08-11 VITALS — BP 118/68 | HR 57 | Temp 98.6°F | Ht 62.0 in | Wt 152.0 lb

## 2020-08-11 DIAGNOSIS — F0150 Vascular dementia without behavioral disturbance: Secondary | ICD-10-CM

## 2020-08-11 DIAGNOSIS — E7849 Other hyperlipidemia: Secondary | ICD-10-CM

## 2020-08-11 DIAGNOSIS — R7303 Prediabetes: Secondary | ICD-10-CM

## 2020-08-11 DIAGNOSIS — I10 Essential (primary) hypertension: Secondary | ICD-10-CM

## 2020-08-11 DIAGNOSIS — F01A0 Vascular dementia, mild, without behavioral disturbance, psychotic disturbance, mood disturbance, and anxiety: Secondary | ICD-10-CM

## 2020-08-11 LAB — COMPREHENSIVE METABOLIC PANEL
ALT: 15 U/L (ref 0–35)
AST: 18 U/L (ref 0–37)
Albumin: 4 g/dL (ref 3.5–5.2)
Alkaline Phosphatase: 71 U/L (ref 39–117)
BUN: 14 mg/dL (ref 6–23)
CO2: 32 mEq/L (ref 19–32)
Calcium: 10.6 mg/dL — ABNORMAL HIGH (ref 8.4–10.5)
Chloride: 102 mEq/L (ref 96–112)
Creatinine, Ser: 0.76 mg/dL (ref 0.40–1.20)
GFR: 80.92 mL/min (ref >60.00)
Glucose, Bld: 80 mg/dL (ref 70–99)
Potassium: 4 mEq/L (ref 3.5–5.1)
Sodium: 138 mEq/L (ref 135–145)
Total Bilirubin: 0.4 mg/dL (ref 0.2–1.2)
Total Protein: 7.4 g/dL (ref 6.0–8.3)

## 2020-08-11 LAB — LIPID PANEL
Cholesterol: 284 mg/dL — ABNORMAL HIGH (ref 0–200)
HDL: 63.2 mg/dL (ref >39.00)
NonHDL: 220.87
Total CHOL/HDL Ratio: 4
Triglycerides: 206 mg/dL — ABNORMAL HIGH (ref 0.0–149.0)
VLDL: 41.2 mg/dL — ABNORMAL HIGH (ref 0.0–40.0)

## 2020-08-11 LAB — HEMOGLOBIN A1C: Hgb A1c MFr Bld: 6.1 % (ref 4.6–6.5)

## 2020-08-11 LAB — LDL CHOLESTEROL, DIRECT: Direct LDL: 173 mg/dL

## 2020-08-11 MED ORDER — PRAVASTATIN SODIUM 10 MG PO TABS: 10.0000 mg | ORAL_TABLET | Freq: Every evening | ORAL | 5 refills | Status: DC

## 2020-08-11 NOTE — Assessment & Plan Note (Signed)
Chronic Check a1c Low sugar / carb diet Stressed regular exercise  

## 2020-08-11 NOTE — Assessment & Plan Note (Addendum)
Chronic Check lipid panel  Has not tolerated crestor, lipitor and zetia Trial of pravastatin 10 mg daily Regular exercise and healthy diet encouraged

## 2020-08-11 NOTE — Assessment & Plan Note (Signed)
Chronic BP well controlled Continue avapro 150 mg daily, atenolol 50 mg daily cmp

## 2020-08-11 NOTE — Assessment & Plan Note (Addendum)
Chronic Following with Dr Delice Lesch We will try low-dose statin since this will hopefully help decrease progression of chronic microvascular disease Encouraged healthier diet and regular exercise

## 2020-08-13 ENCOUNTER — Telehealth: Payer: Self-pay | Admitting: Internal Medicine

## 2020-08-13 NOTE — Telephone Encounter (Signed)
Patient called in regards to recent lab work. She can be reached at [phone]

## 2020-08-14 ENCOUNTER — Other Ambulatory Visit: Payer: Self-pay | Admitting: Internal Medicine

## 2020-08-14 MED ORDER — EZETIMIBE 10 MG PO TABS: 10.0000 mg | ORAL_TABLET | Freq: Every day | ORAL | 3 refills | Status: DC

## 2020-08-14 NOTE — Telephone Encounter (Signed)
Results given today 

## 2020-08-14 NOTE — Telephone Encounter (Signed)
Patient called in regards to lab results.

## 2020-08-15 ENCOUNTER — Telehealth: Payer: Self-pay | Admitting: Internal Medicine

## 2020-08-15 NOTE — Telephone Encounter (Signed)
[facility] Report/Call: ---Caller states the dr told her to pick up medicine zetimibe that the caller has already had before. The caller states she still has the bottle from 2021 and would like to know if she can take that bottle instead of picking up a new prescription. Nurse advised Pt to take the new prescription and to dispose of the old RX. Pt denies any new or worsening symptoms at this time and declined being triaged at this time.

## 2020-08-15 NOTE — Telephone Encounter (Signed)
noted 

## 2020-08-25 NOTE — Progress Notes (Signed)
Assessment/Plan:   Mild vascular neurocognitive disorder  . This is a 70 year old right-handed woman with a history of hypertension, hyperlipidemia, mild vascular neurocognitive disorder who returns today for evaluation.  She was last seen on 07/24/2019.  Her SLUMS score was 20/30.  MRI of the brain at that time showed moderately advanced chronic microvascular disease.  Neurocognitive testing was ordered for further evaluation of her memory concerns.  Dr.Stewart's evaluation on 08/15/2019 confirmed the diagnosis.  She is again alone in the office today without family to corroborate history, denying any difficulties with complex tasks.  Her MMSE score today is 25/30.  However, in view of some trouble finding words, and having had an episode of leaving the stove on, we discussed the possibility of initiating donepezil 5 mg nightly, to help slow down her cognitive decline.  Initially hesitant, she agreed to start after returning from her vacation.  She knows to call if she has any questions or concerns in the interim.  . Discussed safety both in and out of the home. Discussed the importance of regular daily schedule with inclusion of crossword puzzles to maintain brain function.  . Continue to monitor mood with PCP.  Marland Kitchen Continue to stay active exercising  at least 30 minutes at least 3 times a week.  . Naps should be scheduled and should be no longer than 60 minutes and should not occur after 2 PM.  . Mediterranean diet is recommended  . Follow up in 6 months . Start Donepezil 5 mg qhs. Side effects and benefits were discussed, an . Continue to monitor her blood pressure by PCP     Case discussed with Dr. Delice Lesch who agrees with the plan     Subjective:   Karen Evans was seen today in follow up for memory loss.  She is a 70 year old woman, last seen in our clinic on 07/24/2019.  She is again alone in the office with her family to help corroborate her history.  She lives alone, and occasionally,  [place] [facility] comes to check on her, most recently yesterday.  The patient reports feeling well, has not been very active, feels that her memory is stable.  She watches a lot of TV, currently goes out and walk, she does go to church.  She also draws, and paints.  She states that the recent change in her blood pressure medication makes her feel dizzy, but no syncopal episodes were noted.  Going to get in touch with her PCP regarding this issue.  She denies any headaches, trauma, or injuries to the head.  She denies any falls.  She denies any numbness or tingling.  Denies any unilateral weakness.  She denies any double or blurred vision.  She denies any hallucinations or paranoia.  She denies any vivid dreams.  She sleeps well, wakes up very few times at night.  She takes occasionally a nap during the day.  She dresses and takes baths without help.  She denies living objects in unusual places, she cooks, and reported that today, she has left for the first time her stove on, which was of concern.  She denies leaving the faucet on.  She walks without a walker.  Appetite is good, denies any diarrhea.  She has occasional constipation.  She denies any trouble swallowing.  She denies any urinary issues.  Her mood is good, without depression or irritability.  She denies any depression.  She drives, and denies having gotten lost.  She states that his pain  close attention to the signals.  History on Initial Assessment 06/07/2018: This is a 70 year old right-handed woman with a history of hypertension, hyperlipidemia, presenting for evaluation of memory concerns. She reports that she started noticing memory changes "when they started letting people go at the plant in 2016." She noticed she was forgetting things. She reported symptoms to Dr. Quay Burow in November 2019 where she would leave food out without putting it away, one time she left ice cream container out after eating it, and left a doggie bag or groceries in her car.  She has burnt food she forgot was cooking and now has to make herself stay in the kitchen. She lost her keys one time and has started putting them in one place. She denies getting lost driving. No missed bills or medications. 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 She lives alone, her family (mother and brother) live in [place]. She has told them of her memory concerns, "they don't think I need to come here." Her maternal aunt had memory issues. No history of concussions or alcohol use.  She has occasional right hand and left finger tingling. She denies any headaches, dizziness, diplopia, dysarthria/dysphagia, neck/back pain, bowel/bladder dysfunction, anosmia, or tremors. No falls. Sleep is good. Mood "I think it's good." There is no family to corroborate history but there does not appear to be any paranoia or hallucinations.   Diagnostic Data: MRI brain without contrast done 07/2018 which did not show any acute changes. There was moderately advanced chronic microvascular disease.    PREVIOUS MEDICATIONS: None  CURRENT MEDICATIONS:  Outpatient Encounter Medications as of 08/26/2020  Medication Sig  . acetaminophen (TYLENOL) 500 MG tablet Take 250-500 mg by mouth every 6 (six) hours as needed for headache (pain).  Marland Kitchen atenolol (TENORMIN) 50 MG tablet TAKE 1 TABLET BY MOUTH ONCE DAILY IN THE MORNING  . Calcium Carbonate-Vitamin D (CALTRATE 600+D PO) Take 1 tablet by mouth daily.   . cholecalciferol (VITAMIN D) 1000 units tablet Take 1,000 Units by mouth daily.  Marland Kitchen donepezil (ARICEPT) 5 MG tablet Take 1 tablet (5 mg total) by mouth at bedtime.  Marland Kitchen ezetimibe (ZETIA) 10 MG tablet Take 1 tablet (10 mg total) by mouth daily.  . hydrocortisone (ANUSOL-HC) 25 MG suppository Place 25 mg rectally as needed for hemorrhoids or anal itching.  . irbesartan (AVAPRO) 150 MG tablet Take 1 tablet (150 mg total) by mouth daily.  .  Lactase (LACTAID PO) Take 1 tablet by mouth 2 (two) times daily as needed (stomach upset/ gas).   . Multiple Vitamin (MULTIVITAMIN WITH MINERALS) TABS tablet Take 1 tablet by mouth daily.   No facility-administered encounter medications on file as of 08/26/2020.     Objective:     PHYSICAL EXAMINATION:    VITALS:   Vitals:   08/26/20 1021  BP: 138/74  Pulse: (!) 57  SpO2: 99%  Weight: 154 lb 3.2 oz (69.9 kg)  Height: 5\' 2"  (1.575 m)    GEN:  The patient appears stated age and is in NAD. HEENT:  Normocephalic, atraumatic.   Neurological examination:  Orientation: The patient is alert. Oriented to person, place and date  Cranial nerves: There is good facial symmetry.The speech is fluent and clear, occasional forgetting some words.  Hearing is intact to conversational tone. Sensation: Sensation is intact to light touch throughout Motor: Strength is at least antigravity x4.  Movement examination: Tone:  There is normal tone in the UE/LE Abnormal movements:  no tremor.  No myoclonus.  No asterixis.   Coordination:  There is no decremation with RAM's. Gait and Station: The patient has no difficulty arising out of a deep-seated chair without the use of the hands. The patient's stride length is good.    MMSE - Mini Mental State Exam 08/26/2020  Orientation to time 5  Orientation to Place 5  Registration 3  Attention/ Calculation 0  Recall 3  Language- name 2 objects 2  Language- repeat 1  Language- follow 3 step command 3  Language- read & follow direction 1  Write a sentence 1  Copy design 1  Total score 25     CBC    Component Value Date/Time   WBC 7.3 02/08/2020 1343   RBC 5.15 (H) 02/08/2020 1343   HGB 13.2 02/08/2020 1343   HCT 42.0 02/08/2020 1343   PLT 272 02/08/2020 1343   MCV 81.6 02/08/2020 1343   MCH 25.6 (L) 02/08/2020 1343   MCHC 31.4 (L) 02/08/2020 1343   RDW 13.0 02/08/2020 1343   LYMPHSABS 2,526 02/08/2020 1343   MONOABS 0.9 02/06/2019 1407    EOSABS 110 02/08/2020 1343   BASOSABS 51 02/08/2020 1343     CMP Latest Ref Rng & Units 08/11/2020 02/08/2020 08/07/2019  Glucose 70 - 99 mg/dL 80 68 84  BUN 6 - 23 mg/dL 14 12 14   Creatinine 0.40 - 1.20 mg/dL 0.76 0.71 0.72  Sodium 135 - 145 mEq/L 138 139 136  Potassium 3.5 - 5.1 mEq/L 4.0 4.7 4.0  Chloride 96 - 112 mEq/L 102 103 102  CO2 19 - 32 mEq/L 32 29 30  Calcium 8.4 - 10.5 mg/dL 10.6(H) 10.8(H) 10.0  Total Protein 6.0 - 8.3 g/dL 7.4 7.1 7.3  Total Bilirubin 0.2 - 1.2 mg/dL 0.4 0.5 0.3  Alkaline Phos 39 - 117 U/L 71 - 78  AST 0 - 37 U/L 18 18 15   ALT 0 - 35 U/L 15 14 13        Total time spent on today's visit was 50 minutes, including both face-to-face time and nonface-to-face time.  Time included that spent on review of records (prior notes available to me/labs/imaging if pertinent), discussing treatment and goals, answering patient's questions and coordinating care.  Cc:  Binnie Rail, MD Sharene Butters, PA-C

## 2020-08-26 ENCOUNTER — Ambulatory Visit: Payer: Medicare Other | Admitting: Physician Assistant

## 2020-08-26 ENCOUNTER — Encounter: Payer: Self-pay | Admitting: Physician Assistant

## 2020-08-26 ENCOUNTER — Telehealth: Payer: Self-pay

## 2020-08-26 ENCOUNTER — Other Ambulatory Visit: Payer: Self-pay

## 2020-08-26 ENCOUNTER — Other Ambulatory Visit: Payer: Self-pay | Admitting: Physician Assistant

## 2020-08-26 VITALS — BP 138/74 | HR 57 | Ht 62.0 in | Wt 154.2 lb

## 2020-08-26 DIAGNOSIS — F0150 Vascular dementia without behavioral disturbance: Secondary | ICD-10-CM | POA: Diagnosis not present

## 2020-08-26 DIAGNOSIS — F01A0 Vascular dementia, mild, without behavioral disturbance, psychotic disturbance, mood disturbance, and anxiety: Secondary | ICD-10-CM

## 2020-08-26 MED ORDER — DONEPEZIL HCL 5 MG PO TABS: 5.0000 mg | ORAL_TABLET | Freq: Every day | ORAL | 0 refills | Status: DC

## 2020-08-26 MED ORDER — DONEPEZIL HCL 5 MG PO TABS: 5.0000 mg | ORAL_TABLET | Freq: Every day | ORAL | 11 refills | Status: DC

## 2020-08-26 NOTE — Progress Notes (Unsigned)
Spoke to [facility] regarding Aricept 5 mg qhs, needed refills for a year.

## 2020-08-26 NOTE — Patient Instructions (Addendum)
It was a pleasure to see you today at our office.   Recommendations:  Meds: Follow up in 6 months Start Donepezil (Aricept) 5 mg at bedtime Call your primary doctor to adjust your blood pressure medications   We will start donepezil (Aricept) 5mg  daily.  Side effects include nausea, vomiting, diarrhea, vivid dreams, and muscle cramps.  Please call the clinic if you experience any of these symptoms.  RECOMMENDATIONS FOR ALL PATIENTS WITH MEMORY PROBLEMS: 1. Continue to exercise (Recommend 30 minutes of walking everyday, or 3 hours every week) 2. Increase social interactions - continue going to [place] and enjoy social gatherings with friends and family 3. Eat healthy, avoid fried foods and eat more fruits and vegetables 4. Maintain adequate blood pressure, blood sugar, and blood cholesterol level. Reducing the risk of stroke and cardiovascular disease also helps promoting better memory. 5. Avoid stressful situations. Live a simple life and avoid aggravations. Organize your time and prepare for the next day in anticipation. 6. Sleep well, avoid any interruptions of sleep and avoid any distractions in the bedroom that may interfere with adequate sleep quality 7. Avoid sugar, avoid sweets as there is a strong link between excessive sugar intake, diabetes, and cognitive impairment We discussed the Mediterranean diet, which has been shown to help patients reduce the risk of progressive memory disorders and reduces cardiovascular risk. This includes eating fish, eat fruits and green leafy vegetables, nuts like almonds and hazelnuts, walnuts, and also use olive oil. Avoid fast foods and fried foods as much as possible. Avoid sweets and sugar as sugar use has been linked to worsening of memory function.  There is always a concern of gradual progression of memory problems. If this is the case, then we may need to adjust level of care according to patient needs. Support, both to the patient and caregiver,  should then be put into place.    FALL PRECAUTIONS: Be cautious when walking. Scan the area for obstacles that may increase the risk of trips and falls. When getting up in the mornings, sit up at the edge of the bed for a few minutes before getting out of bed. Consider elevating the bed at the head end to avoid drop of blood pressure when getting up. Walk always in a well-lit room (use night lights in the walls). Avoid area rugs or power cords from appliances in the middle of the walkways. Use a walker or a cane if necessary and consider physical therapy for balance exercise. Get your eyesight checked regularly.   HOME SAFETY: Consider the safety of the kitchen when operating appliances like stoves, microwave oven, and blender. Consider having supervision and share cooking responsibilities until no longer able to participate in those. Accidents with firearms and other hazards in the house should be identified and addressed as well       Mediterranean Diet A Mediterranean diet refers to food and lifestyle choices that are based on the traditions of countries located on the [employer]. This way of eating has been shown to help prevent certain conditions and improve outcomes for people who have chronic diseases, like kidney disease and heart disease. What are tips for following this plan? Lifestyle   Cook and eat meals together with your family, when possible.  Drink enough fluid to keep your urine clear or pale yellow.  Be physically active every day. This includes:  Aerobic exercise like running or swimming.  Leisure activities like gardening, walking, or housework.  Get 7-8 hours of sleep  each night.  If recommended by your health care provider, drink red wine in moderation. This means 1 glass a day for nonpregnant women and 2 glasses a day for men. A glass of wine equals 5 oz (150 mL). Reading food labels   Check the serving size of packaged foods. For foods such as rice and  pasta, the serving size refers to the amount of cooked product, not dry.  Check the total fat in packaged foods. Avoid foods that have saturated fat or trans fats.  Check the ingredients list for added sugars, such as corn syrup. Shopping   At the grocery store, buy most of your food from the areas near the walls of the store. This includes:  Fresh fruits and vegetables (produce).  Grains, beans, nuts, and seeds. Some of these may be available in unpackaged forms or large amounts (in bulk).  Fresh seafood.  Poultry and eggs.  Low-fat dairy products.  Buy whole ingredients instead of prepackaged foods.  Buy fresh fruits and vegetables in-season from local farmers markets.  Buy frozen fruits and vegetables in resealable bags.  If you do not have access to quality fresh seafood, buy precooked frozen shrimp or canned fish, such as tuna, salmon, or sardines.  Buy small amounts of raw or cooked vegetables, salads, or olives from the deli or salad bar at your store.  Stock your pantry so you always have certain foods on hand, such as olive oil, canned tuna, canned tomatoes, rice, pasta, and beans. Cooking   Cook foods with extra-virgin olive oil instead of using butter or other vegetable oils.  Have meat as a side dish, and have vegetables or grains as your main dish. This means having meat in small portions or adding small amounts of meat to foods like pasta or stew.  Use beans or vegetables instead of meat in common dishes like chili or lasagna.  Experiment with different cooking methods. Try roasting or broiling vegetables instead of steaming or sauteing them.  Add frozen vegetables to soups, stews, pasta, or rice.  Add nuts or seeds for added healthy fat at each meal. You can add these to yogurt, salads, or vegetable dishes.  Marinate fish or vegetables using olive oil, lemon juice, garlic, and fresh herbs. Meal planning   Plan to eat 1 vegetarian meal one day each week.  Try to work up to 2 vegetarian meals, if possible.  Eat seafood 2 or more times a week.  Have healthy snacks readily available, such as:  Vegetable sticks with hummus.  Greek yogurt.  Fruit and nut trail mix.  Eat balanced meals throughout the week. This includes:  Fruit: 2-3 servings a day  Vegetables: 4-5 servings a day  Low-fat dairy: 2 servings a day  Fish, poultry, or lean meat: 1 serving a day  Beans and legumes: 2 or more servings a week  Nuts and seeds: 1-2 servings a day  Whole grains: 6-8 servings a day  Extra-virgin olive oil: 3-4 servings a day  Limit red meat and sweets to only a few servings a month What are my food choices?  Mediterranean diet  Recommended  Grains: Whole-grain pasta. Brown rice. Bulgar wheat. Polenta. Couscous. Whole-wheat bread. [place].  Vegetables: Artichokes. Beets. Broccoli. Cabbage. Carrots. Eggplant. Green beans. Chard. Kale. Spinach. Onions. Leeks. Peas. Squash. Tomatoes. Peppers. Radishes.  Fruits: Apples. Apricots. Avocado. Berries. Bananas. Cherries. Dates. Figs. Grapes. Lemons. Melon. Oranges. Peaches. Plums. Pomegranate.  Meats and other protein foods: Beans. Almonds. Sunflower seeds. [employer]  nuts. Peanuts. [place]. Salmon. Scallops. Shrimp. Artesia. Tilapia. Clams. Oysters. Eggs.  Dairy: Low-fat milk. Cheese. Greek yogurt.  Beverages: Water. Red wine. Herbal tea.  Fats and oils: Extra virgin olive oil. Avocado oil. Grape seed oil.  Sweets and desserts: [place] yogurt with honey. Baked apples. Poached pears. Trail mix.  Seasoning and other foods: Basil. Cilantro. Coriander. Cumin. Mint. Parsley. Sage. Rosemary. Tarragon. Garlic. Oregano. Thyme. Pepper. Balsalmic vinegar. Tahini. Hummus. Tomato sauce. Olives. Mushrooms.  Limit these  Grains: Prepackaged pasta or rice dishes. Prepackaged cereal with added sugar.  Vegetables: Deep fried potatoes (french fries).  Fruits: Fruit canned in syrup.  Meats and other protein  foods: Beef. Pork. Lamb. Poultry with skin. Hot dogs. [place] Salines.  Dairy: Ice cream. Sour cream. Whole milk.  Beverages: Juice. Sugar-sweetened soft drinks. Beer. Liquor and spirits.  Fats and oils: Butter. Canola oil. Vegetable oil. Beef fat (tallow). Lard.  Sweets and desserts: Cookies. Cakes. Pies. Candy.  Seasoning and other foods: Mayonnaise. Premade sauces and marinades.  The items listed may not be a complete list. Talk with your dietitian about what dietary choices are right for you. Summary  The Mediterranean diet includes both food and lifestyle choices.  Eat a variety of fresh fruits and vegetables, beans, nuts, seeds, and whole grains.  Limit the amount of red meat and sweets that you eat.  Talk with your health care provider about whether it is safe for you to drink red wine in moderation. This means 1 glass a day for nonpregnant women and 2 glasses a day for men. A glass of wine equals 5 oz (150 mL). This information is not intended to replace advice given to you by your health care provider. Make sure you discuss any questions you have with your health care provider. Document Released: 12/25/2015 Document Revised: 01/27/2016 Document Reviewed: 12/25/2015 Elsevier Interactive Patient Education  2017 [employer].

## 2020-08-26 NOTE — Addendum Note (Signed)
Addended by: Cameron Sprang on: 08/26/2020 11:41 PM   Modules accepted: Level of Service

## 2020-08-26 NOTE — Telephone Encounter (Signed)
Patient advised to ideally monitor blood pressure at home.  Blood pressure was reasonable at the neurologist office so no immediate changes are needed.  Blood pressure does vary at times-ideally I would like her checking her blood pressure at home on a regular basis  We will schedule an appointment so they can we can review blood pressures and medications and try to figure out which medication is causing dizziness.

## 2020-09-04 NOTE — Progress Notes (Signed)
Subjective:    Patient ID: Karen Evans, female    DOB: January 24, 1951, 70 y.o.   MRN: [id number]  HPI The patient is here for follow up of their chronic medical problems, including htn   bp at home  110/72, 145/98, 114/75, 149/93, 133/84, 156/91, 128/87, 105/63, 123/88, 107/69, 151/99, 147/95   She has intermittent lightheadedness, but it occurs daily.  It occurs in the morning and evening.  She is unaware of any pattern.  She is a poor historian.  She has not checked her BP when she is lightheaded.  She drinks a lot of fluids during the day.    She takes the atenolol and zetia at night.  She takes the avapro in the evening.  She thinks this started when started the avapro    Medications and allergies reviewed with patient and updated if appropriate.  Patient Active Problem List   Diagnosis Date Noted  . 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 Medication reaction, subsequent encounter 09/25/2018  . Osteopenia 07/25/2017  . Intermittent lightheadedness 02/26/2016  . Prediabetes 10/27/2015  . History of colonic polyps 10/12/2013  . Dyspnea 07/23/2013  . Hyperlipidemia 03/29/2011  . GERD (gastroesophageal reflux disease) 02/18/2011  . 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 Vitamin D deficiency 03/26/2010  . IRRITABLE BOWEL SYNDROME 04/25/2008  . HIATAL HERNIA 04/24/2008  . RHINITIS 03/14/2008  . History of uterine fibroid 12/29/2007  . Goiter 12/29/2007  . 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  . Essential hypertension 12/29/2007    Current Outpatient Medications on File Prior to Visit  Medication Sig Dispense Refill  . acetaminophen (TYLENOL) 500 MG tablet Take 250-500 mg by mouth every 6 (six) hours as needed for headache (pain).    Marland Kitchen atenolol (TENORMIN) 50 MG tablet TAKE 1 TABLET BY MOUTH ONCE DAILY IN THE MORNING 90 tablet 1  . Calcium Carbonate-Vitamin D (CALTRATE 600+D PO) Take 1 tablet by mouth daily.     . cholecalciferol  (VITAMIN D) 1000 units tablet Take 1,000 Units by mouth daily.    Marland Kitchen donepezil (ARICEPT) 5 MG tablet Take 1 tablet (5 mg total) by mouth at bedtime. 30 tablet 11  . ezetimibe (ZETIA) 10 MG tablet Take 1 tablet (10 mg total) by mouth daily. 90 tablet 3  . hydrocortisone (ANUSOL-HC) 25 MG suppository Place 25 mg rectally as needed for hemorrhoids or anal itching.    . Lactase (LACTAID PO) Take 1 tablet by mouth 2 (two) times daily as needed (stomach upset/ gas).     . Multiple Vitamin (MULTIVITAMIN WITH MINERALS) TABS tablet Take 1 tablet by mouth daily.     No current facility-administered medications on file prior to visit.    Past Medical History:  Diagnosis Date  . Allergy   . Anemia    PMH of   . Colon polyp   . Crohn's    ileitis/mild; Dr Sharlett Iles  . Duodenitis without mention of hemorrhage 2007   EGD  . Fibroid, uterine    Dr Garwin Brothers; G 0 P 0  . GERD (gastroesophageal reflux disease)   . Goiter    CTS,RUE > LUE  . Heart murmur   . Hiatal hernia 2002   EGD  . Hypertension   . Internal hemorrhoids   . Migraines    when takes vitamins   . Rectal polyp 01/24/2012   TUBULAR ADENOMA  . Reflux esophagitis 2007   EGD  . Stricture and stenosis of esophagus 2007   EGD  .  Tubular adenoma of colon   . Ulcer   . Vitamin D deficiency     Past Surgical History:  Procedure Laterality Date  . COLONOSCOPY     polyps;granular ileitis 2006;2007 normal, Dr Sharlett Iles  . HEMORRHOID SURGERY    . OPERATIVE HYSTEROSCOPY  08-05-04   Dr Garwin Brothers  . OVARIAN CYST REMOVAL    . POLYPECTOMY    . UPPER GASTROINTESTINAL ENDOSCOPY      Social History   Socioeconomic History  . Marital status: Single    Spouse name: Not on file  . Number of children: 0  . Years of education: Not on file  . Highest education level: Not on file  Occupational History  . Occupation: TESTER    Employer: [employer]  Tobacco Use  . Smoking status: Never Smoker  . Smokeless tobacco: Never Used   Vaping Use  . Vaping Use: Never used  Substance and Sexual Activity  . Alcohol use: No  . Drug use: No  . Sexual activity: Not on file  Other Topics Concern  . Not on file  Social History Narrative   Right handed      Some College   Social Determinants of Health   Financial Resource Strain: Low Risk   . Difficulty of Paying Living Expenses: Not hard at all  Food Insecurity: No Food Insecurity  . Worried About Charity fundraiser in the Last Year: Never true  . Ran Out of Food in the Last Year: Never true  Transportation Needs: No Transportation Needs  . Lack of Transportation (Medical): No  . Lack of Transportation (Non-Medical): No  Physical Activity: Insufficiently Active  . Days of Exercise per Week: 3 days  . Minutes of Exercise per Session: 30 min  Stress: Stress Concern Present  . Feeling of Stress : To some extent  Social Connections: Socially Isolated  . Frequency of Communication with Friends and Family: More than three times a week  . Frequency of Social Gatherings with Friends and Family: Never  . Attends Religious Services: Never  . Active Member of Clubs or Organizations: No  . Attends Archivist Meetings: Never  . Marital Status: Never married    Family History  Problem Relation Age of Onset  . Lymphoma Father   . Diabetes Mother   . Hypertension Mother   . Heart disease Mother   . Penile cancer Paternal Grandfather   . Diabetes Brother        CAD; Dialysis  . Hypertension Brother   . Heart attack Other        1/2 maternal aunts X 2  . Rectal cancer Paternal Grandmother   . Colon cancer Maternal Grandmother   . Cancer Other   . COPD Neg Hx   . Esophageal cancer Neg Hx   . Stomach cancer Neg Hx   . Stroke Neg Hx     Review of Systems  Constitutional: Negative for fever.  Respiratory: Negative for cough, shortness of breath and wheezing.   Cardiovascular: Positive for palpitations (after taking avapro). Negative for chest pain and  leg swelling.  Neurological: Positive for light-headedness. Negative for dizziness and headaches.       Objective:   Vitals:   09/05/20 1313  BP: (!) 154/86  Pulse: 71  Temp: 98.6 F (37 C)  SpO2: 97%   BP Readings from Last 3 Encounters:  09/05/20 (!) 154/86  08/26/20 138/74  08/11/20 118/68   Wt Readings from Last 3 Encounters:  09/05/20  153 lb (69.4 kg)  08/26/20 154 lb 3.2 oz (69.9 kg)  08/11/20 152 lb (68.9 kg)   Body mass index is 27.98 kg/m.   Physical Exam    Constitutional: Appears well-developed and well-nourished. No distress.  HENT:  Head: Normocephalic and atraumatic.  Neck: Neck supple. No tracheal deviation present. No thyromegaly present.  No cervical lymphadenopathy Cardiovascular: Normal rate, regular rhythm and normal heart sounds.   No murmur heard. No carotid bruit .  No edema Pulmonary/Chest: Effort normal and breath sounds normal. No respiratory distress. No has no wheezes. No rales.  Skin: Skin is warm and dry. Not diaphoretic.  Psychiatric: Normal mood and affect. Behavior is normal.      Assessment & Plan:    See Problem List for Assessment and Plan of chronic medical problems.    This visit occurred during the SARS-CoV-2 public health emergency.  Safety protocols were in place, including screening questions prior to the visit, additional usage of staff PPE, and extensive cleaning of exam room while observing appropriate contact time as indicated for disinfecting solutions.

## 2020-09-04 NOTE — Patient Instructions (Addendum)
  Monitor your BP.   Medications changes include :   Stop avapro (irbesartan) and start telmisartan 80 mg daily   Your prescription(s) have been submitted to your pharmacy. Please take as directed and contact our office if you believe you are having problem(s) with the medication(s).   Please followup in 2 weeks

## 2020-09-05 ENCOUNTER — Other Ambulatory Visit: Payer: Self-pay

## 2020-09-05 ENCOUNTER — Ambulatory Visit (INDEPENDENT_AMBULATORY_CARE_PROVIDER_SITE_OTHER): Payer: Medicare Other | Admitting: Internal Medicine

## 2020-09-05 ENCOUNTER — Encounter: Payer: Self-pay | Admitting: Internal Medicine

## 2020-09-05 VITALS — BP 154/86 | HR 71 | Temp 98.6°F | Ht 62.0 in | Wt 153.0 lb

## 2020-09-05 DIAGNOSIS — I10 Essential (primary) hypertension: Secondary | ICD-10-CM | POA: Diagnosis not present

## 2020-09-05 DIAGNOSIS — R42 Dizziness and giddiness: Secondary | ICD-10-CM

## 2020-09-05 MED ORDER — TELMISARTAN 80 MG PO TABS: 80.0000 mg | ORAL_TABLET | Freq: Every day | ORAL | 5 refills | Status: DC

## 2020-09-05 NOTE — Assessment & Plan Note (Addendum)
Acute She thinks it started when she started the avapro She is a poor historian so it is difficult to know if there is a pattern Advised to check BP when she feels lightheaded and write it down Stop avapro - will substitute a different medication - she has had side effects to many medications Follow up in 2 weeks

## 2020-09-05 NOTE — Assessment & Plan Note (Addendum)
Chronic ? Lightheadedness from avapro, low BP, high BP, something else Stop avapro Start telmisartan 80 mg daily Continue atenolol 50 mg daily Monitor BP Advised to check BP when lightheaded F/u in 2 weeks

## 2020-09-15 ENCOUNTER — Other Ambulatory Visit: Payer: Self-pay | Admitting: Internal Medicine

## 2020-09-18 ENCOUNTER — Other Ambulatory Visit: Payer: Self-pay

## 2020-09-18 NOTE — Progress Notes (Signed)
Subjective:    Patient ID: Karen Evans, female    DOB: 08-09-1950, 70 y.o.   MRN: [id number]  HPI The patient is here for follow up of their chronic medical problems, including htn   BP  79/51 - 147/90.  Mostly 108-123/70's  She denies feeling off when her BP was low.  She still has some lightheadedness, but there is no pattern to it.  She wonders if it is from her sinuses.  She has sinus headaches.  She is a poor historian.      Medications and allergies reviewed with patient and updated if appropriate.  Patient Active Problem List   Diagnosis Date Noted  . Mild vascular neurocognitive disorder ([place]) 02/07/2020  . Medication reaction, subsequent encounter 09/25/2018  . Osteopenia 07/25/2017  . Intermittent lightheadedness 02/26/2016  . Prediabetes 10/27/2015  . History of colonic polyps 10/12/2013  . Dyspnea 07/23/2013  . Hyperlipidemia 03/29/2011  . GERD (gastroesophageal reflux disease) 02/18/2011  . 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 Vitamin D deficiency 03/26/2010  . IRRITABLE BOWEL SYNDROME 04/25/2008  . HIATAL HERNIA 04/24/2008  . RHINITIS 03/14/2008  . History of uterine fibroid 12/29/2007  . Goiter 12/29/2007  . 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  . Essential hypertension 12/29/2007    Current Outpatient Medications on File Prior to Visit  Medication Sig Dispense Refill  . acetaminophen (TYLENOL) 500 MG tablet Take 250-500 mg by mouth every 6 (six) hours as needed for headache (pain).    Marland Kitchen atenolol (TENORMIN) 50 MG tablet TAKE 1 TABLET BY MOUTH ONCE DAILY IN THE MORNING 90 tablet 3  . Calcium Carbonate-Vitamin D (CALTRATE 600+D PO) Take 1 tablet by mouth daily.     . cholecalciferol (VITAMIN D) 1000 units tablet Take 1,000 Units by mouth daily.    Marland Kitchen ezetimibe (ZETIA) 10 MG tablet Take 1 tablet (10 mg total) by mouth daily. 90 tablet 3  . hydrocortisone (ANUSOL-HC) 25 MG suppository Place 25 mg rectally as needed for hemorrhoids or  anal itching.    . Lactase (LACTAID PO) Take 1 tablet by mouth 2 (two) times daily as needed (stomach upset/ gas).     . Multiple Vitamin (MULTIVITAMIN WITH MINERALS) TABS tablet Take 1 tablet by mouth daily.    Marland Kitchen telmisartan (MICARDIS) 80 MG tablet Take 1 tablet (80 mg total) by mouth daily. 30 tablet 5  . donepezil (ARICEPT) 5 MG tablet Take 1 tablet (5 mg total) by mouth at bedtime. (Patient not taking: Reported on 09/19/2020) 30 tablet 11   No current facility-administered medications on file prior to visit.    Past Medical History:  Diagnosis Date  . Allergy   . Anemia    PMH of   . Colon polyp   . Crohn's    ileitis/mild; Dr Sharlett Iles  . Duodenitis without mention of hemorrhage 2007   EGD  . Fibroid, uterine    Dr Garwin Brothers; G 0 P 0  . GERD (gastroesophageal reflux disease)   . Goiter    CTS,RUE > LUE  . Heart murmur   . Hiatal hernia 2002   EGD  . Hypertension   . Internal hemorrhoids   . Migraines    when takes vitamins   . Rectal polyp 01/24/2012   TUBULAR ADENOMA  . Reflux esophagitis 2007   EGD  . Stricture and stenosis of esophagus 2007   EGD  . Tubular adenoma of colon   . Ulcer   . Vitamin D deficiency  Past Surgical History:  Procedure Laterality Date  . COLONOSCOPY     polyps;granular ileitis 2006;2007 normal, Dr Sharlett Iles  . HEMORRHOID SURGERY    . OPERATIVE HYSTEROSCOPY  08-05-04   Dr Garwin Brothers  . OVARIAN CYST REMOVAL    . POLYPECTOMY    . UPPER GASTROINTESTINAL ENDOSCOPY      Social History   Socioeconomic History  . Marital status: Single    Spouse name: Not on file  . Number of children: 0  . Years of education: Not on file  . Highest education level: Not on file  Occupational History  . Occupation: TESTER    Employer: [employer]  Tobacco Use  . Smoking status: Never Smoker  . Smokeless tobacco: Never Used  Vaping Use  . Vaping Use: Never used  Substance and Sexual Activity  . Alcohol use: No  . Drug use: No  . Sexual  activity: Not on file  Other Topics Concern  . Not on file  Social History Narrative   Right handed      Some College   Social Determinants of Health   Financial Resource Strain: Low Risk   . Difficulty of Paying Living Expenses: Not hard at all  Food Insecurity: No Food Insecurity  . Worried About Charity fundraiser in the Last Year: Never true  . Ran Out of Food in the Last Year: Never true  Transportation Needs: No Transportation Needs  . Lack of Transportation (Medical): No  . Lack of Transportation (Non-Medical): No  Physical Activity: Insufficiently Active  . Days of Exercise per Week: 3 days  . Minutes of Exercise per Session: 30 min  Stress: Stress Concern Present  . Feeling of Stress : To some extent  Social Connections: Socially Isolated  . Frequency of Communication with Friends and Family: More than three times a week  . Frequency of Social Gatherings with Friends and Family: Never  . Attends Religious Services: Never  . Active Member of Clubs or Organizations: No  . Attends Archivist Meetings: Never  . Marital Status: Never married    Family History  Problem Relation Age of Onset  . Lymphoma Father   . Diabetes Mother   . Hypertension Mother   . Heart disease Mother   . Penile cancer Paternal Grandfather   . Diabetes Brother        CAD; Dialysis  . Hypertension Brother   . Heart attack Other        1/2 maternal aunts X 2  . Rectal cancer Paternal Grandmother   . Colon cancer Maternal Grandmother   . Cancer Other   . COPD Neg Hx   . Esophageal cancer Neg Hx   . Stomach cancer Neg Hx   . Stroke Neg Hx     Review of Systems  Constitutional: Negative for fever.  Respiratory: Negative for shortness of breath.   Cardiovascular: Negative for chest pain, palpitations and leg swelling.  Neurological: Positive for light-headedness (improved) and headaches (sinus ).       Objective:   Vitals:   09/19/20 1408  BP: 124/74  Pulse: 61   Temp: 98.4 F (36.9 C)  SpO2: 99%   BP Readings from Last 3 Encounters:  09/19/20 124/74  09/05/20 (!) 154/86  08/26/20 138/74   Wt Readings from Last 3 Encounters:  09/19/20 152 lb (68.9 kg)  09/05/20 153 lb (69.4 kg)  08/26/20 154 lb 3.2 oz (69.9 kg)   Body mass index is 27.8 kg/m.  Physical Exam    Constitutional: Appears well-developed and well-nourished. No distress.  HENT:  Head: Normocephalic and atraumatic.  Neck: Neck supple. No tracheal deviation present. No thyromegaly present.  No cervical lymphadenopathy Cardiovascular: Normal rate, regular rhythm and normal heart sounds.   No murmur heard. No carotid bruit .  No edema Pulmonary/Chest: Effort normal and breath sounds normal. No respiratory distress. No has no wheezes. No rales.  Skin: Skin is warm and dry. Not diaphoretic.  Psychiatric: Normal mood and affect. Behavior is normal.      Assessment & Plan:    See Problem List for Assessment and Plan of chronic medical problems.    This visit occurred during the SARS-CoV-2 public health emergency.  Safety protocols were in place, including screening questions prior to the visit, additional usage of staff PPE, and extensive cleaning of exam room while observing appropriate contact time as indicated for disinfecting solutions.

## 2020-09-19 ENCOUNTER — Encounter: Payer: Self-pay | Admitting: Internal Medicine

## 2020-09-19 ENCOUNTER — Ambulatory Visit (INDEPENDENT_AMBULATORY_CARE_PROVIDER_SITE_OTHER): Payer: Medicare Other | Admitting: Internal Medicine

## 2020-09-19 VITALS — BP 124/74 | HR 61 | Temp 98.4°F | Ht 62.0 in | Wt 152.0 lb

## 2020-09-19 DIAGNOSIS — I10 Essential (primary) hypertension: Secondary | ICD-10-CM

## 2020-09-19 DIAGNOSIS — E7849 Other hyperlipidemia: Secondary | ICD-10-CM

## 2020-09-19 LAB — TSH: TSH: 0.46 u[IU]/mL (ref 0.35–4.50)

## 2020-09-19 LAB — COMPREHENSIVE METABOLIC PANEL
ALT: 16 U/L (ref 0–35)
AST: 16 U/L (ref 0–37)
Albumin: 4 g/dL (ref 3.5–5.2)
Alkaline Phosphatase: 70 U/L (ref 39–117)
BUN: 14 mg/dL (ref 6–23)
CO2: 32 mEq/L (ref 19–32)
Calcium: 10 mg/dL (ref 8.4–10.5)
Chloride: 103 mEq/L (ref 96–112)
Creatinine, Ser: 0.76 mg/dL (ref 0.40–1.20)
GFR: 80.86 mL/min (ref >60.00)
Glucose, Bld: 78 mg/dL (ref 70–99)
Potassium: 3.7 mEq/L (ref 3.5–5.1)
Sodium: 139 mEq/L (ref 135–145)
Total Bilirubin: 0.5 mg/dL (ref 0.2–1.2)
Total Protein: 7.4 g/dL (ref 6.0–8.3)

## 2020-09-19 LAB — LIPID PANEL
Cholesterol: 236 mg/dL — ABNORMAL HIGH (ref 0–200)
HDL: 64.3 mg/dL (ref >39.00)
LDL Cholesterol: 139 mg/dL — ABNORMAL HIGH (ref 0–99)
NonHDL: 172.17
Total CHOL/HDL Ratio: 4
Triglycerides: 164 mg/dL — ABNORMAL HIGH (ref 0.0–149.0)
VLDL: 32.8 mg/dL (ref 0.0–40.0)

## 2020-09-19 MED ORDER — ATENOLOL 25 MG PO TABS: 25.0000 mg | ORAL_TABLET | Freq: Every day | ORAL | 1 refills | Status: DC

## 2020-09-19 NOTE — Patient Instructions (Addendum)
  Blood work was ordered.     Medications changes include :  Decrease Atenolol to 25 mg daily   Your prescription(s) have been submitted to your pharmacy. Please take as directed and contact our office if you believe you are having problem(s) with the medication(s).      Please followup in September as scheduled.

## 2020-09-20 NOTE — Assessment & Plan Note (Addendum)
Chronic BP too low at times - does not seem to be symptotic Decrease atenolol to 25 mg daily Continue telmisartan  80 mg daily Monitor BP at home Cmp, tsh

## 2020-09-20 NOTE — Assessment & Plan Note (Signed)
Chronic Intolerant to statins Started zetia 08/14/20 Check cmp, lipids Advised healthy diet and regular exercise

## 2020-10-28 ENCOUNTER — Ambulatory Visit: Payer: Medicare Other | Admitting: Neurology

## 2020-10-30 ENCOUNTER — Ambulatory Visit: Payer: Medicare Other | Admitting: Nurse Practitioner

## 2020-11-11 ENCOUNTER — Encounter: Payer: Self-pay | Admitting: Gastroenterology

## 2020-11-11 ENCOUNTER — Ambulatory Visit: Payer: Medicare Other | Admitting: Gastroenterology

## 2020-11-11 VITALS — BP 130/72 | HR 68 | Ht 62.0 in | Wt 153.6 lb

## 2020-11-11 DIAGNOSIS — R197 Diarrhea, unspecified: Secondary | ICD-10-CM

## 2020-11-11 DIAGNOSIS — K5900 Constipation, unspecified: Secondary | ICD-10-CM

## 2020-11-11 NOTE — Patient Instructions (Signed)
If you are age 68 or older, your body mass index should be between 23-30. Your Body mass index is 28.09 kg/m. If this is out of the aforementioned range listed, please consider follow up with your Primary Care Provider.  If you are age 58 or younger, your body mass index should be between 19-25. Your Body mass index is 28.09 kg/m. If this is out of the aformentioned range listed, please consider follow up with your Primary Care Provider.   Your provider has requested that you go to the basement level for lab work before leaving today. Press "B" on the elevator. The lab is located at the first door on the left as you exit the elevator.  Start Miralax daily if stool studies are negative.  Stop eating ice cream and diary for three weeks to see of the improvement.  The [facility] providers would like to encourage you to use [facility] to communicate with providers for non-urgent requests or questions.  Due to long hold times on the telephone, sending your provider a message by [facility] may be a faster and more efficient way to get a response.  Please allow 48 business hours for a response.  Please remember that this is for non-urgent requests.   It was a pleasure to see you today!  Thank you for trusting me with your gastrointestinal care!    Alonza Bogus, PA-C

## 2020-11-11 NOTE — Progress Notes (Signed)
11/11/2020 KRINA MRAZ [id number] Feb 17, 1951   HISTORY OF PRESENT ILLNESS: This is a 70 year old female is a patient of Dr. Vena Rua.  She follows here usually for her issues with acid reflux and constipation.  She actually tells me her acid reflux issues have been better lately.  She is here today with complaints of what sounds like change in bowel habits.  She tells me that she has been having a hard stool each day and that is usually followed by some loose stool/diarrhea.  She does NOThave multiple diarrhea episodes in a day.  She says her bowel movements are typically in the morning.  She would like to be checked for infection.  She admits that she eats a lot of ice cream, but says that ice cream and milk tend to give her loose stools.  She said that it would be hard to try to cut ice cream out because she really likes it.  She comments saying that she would like to see Dr. Hilarie Fredrickson, but it is always such a long wait to see him.  She is really a poor historian.  Last colonoscopy March 2018 showed the following:  - The perianal and digital rectal examinations were normal. - Two sessile polyps were found in the transverse colon. The polyps were 4 to 6 mm in size. These polyps were removed with a cold snare. Resection and retrieval were complete. - A localized area of nodular mucosa was found in the distal rectum. Multiple biopsies were obtained with cold forceps for histology in a targeted manner. - Internal hemorrhoids were found during retroflexion. The hemorrhoids were small.  1. Surgical [P], transverse, polyp (2) -TUBULAR ADENOMA AND POLYPOID COLONIC MUCOSA WITH LYMPHOID AGGREGATE. -NO HIGH GRADE DYSPLASIA OR MALIGNANCY IDENTIFIED. 2. Surgical [P], rectum- nodular mucosa -POLYPOID BENIGN RECTAL MUCOSA WITH CHANGES OF TRAUMA/PROLAPSE. -NO ADENOMATOUS CHANGE OR MALIGNANCY IDENTIFIED. -SEE COMMENT.   Past Medical History:  Diagnosis Date   Allergy    Anemia    PMH of     Colon polyp    Crohn's    ileitis/mild; Dr Sharlett Iles   Duodenitis without mention of hemorrhage 2007   EGD   Fibroid, uterine    Dr Garwin Brothers; G 0 P 0   GERD (gastroesophageal reflux disease)    Goiter    CTS,RUE > LUE   Heart murmur    Hiatal hernia 2002   EGD   Hypertension    Internal hemorrhoids    Migraines    when takes vitamins    Rectal polyp 01/24/2012   TUBULAR ADENOMA   Reflux esophagitis 2007   EGD   Stricture and stenosis of esophagus 2007   EGD   Tubular adenoma of colon    Ulcer    Vitamin D deficiency    Past Surgical History:  Procedure Laterality Date   COLONOSCOPY     polyps;granular ileitis 2006;2007 normal, Dr Sharlett Iles   HEMORRHOID SURGERY     OPERATIVE HYSTEROSCOPY  08-05-04   Dr Garwin Brothers   OVARIAN CYST REMOVAL     POLYPECTOMY     UPPER GASTROINTESTINAL ENDOSCOPY      reports that she has never smoked. She has never used smokeless tobacco. She reports that she does not drink alcohol and does not use drugs. family history includes Cancer in an other family member; Colon cancer in her maternal grandmother; Diabetes in her brother and mother; Heart attack in an other family member; Heart disease in her mother; Hypertension in her  brother and mother; Lymphoma in her father; Penile cancer in her paternal grandfather; Rectal cancer in her paternal grandmother. Allergies  Allergen Reactions   Cefprozil Hives   Erythromycin Hives   Esomeprazole Magnesium Hives   Penicillins Hives and Rash    Has patient had a PCN reaction causing immediate rash, facial/tongue/throat swelling, SOB or lightheadedness with hypotension: Yes Has patient had a PCN reaction causing severe rash involving mucus membranes or skin necrosis: No Has patient had a PCN reaction that required hospitalization: No Has patient had a PCN reaction occurring within the last 10 years: No If all of the above answers are "NO", then may proceed with Cephalosporin use.   Sulfonamide Derivatives      hives   Amlodipine Other (See Comments)    gerd   Crestor [Rosuvastatin Calcium]     itching   Crestor [Rosuvastatin]     Lightheadedness, itchy, prickly all over   Erythromycin Base    Hydrochlorothiazide Other (See Comments)    lightheadedness   Pravastatin     Dizzy, muscle soreness   Sulfa Antibiotics    Zetia [Ezetimibe]     Caused drowsiness   Atorvastatin Other (See Comments)    Eyes itch   Doxycycline Nausea And Vomiting      Outpatient Encounter Medications as of 11/11/2020  Medication Sig   acetaminophen (TYLENOL) 500 MG tablet Take 250-500 mg by mouth every 6 (six) hours as needed for headache (pain).   atenolol (TENORMIN) 25 MG tablet Take 1 tablet (25 mg total) by mouth daily.   Calcium Carbonate-Vitamin D (CALTRATE 600+D PO) Take 1 tablet by mouth daily.    cholecalciferol (VITAMIN D) 1000 units tablet Take 1,000 Units by mouth daily.   ezetimibe (ZETIA) 10 MG tablet Take 1 tablet (10 mg total) by mouth daily.   hydrocortisone (ANUSOL-HC) 25 MG suppository Place 25 mg rectally as needed for hemorrhoids or anal itching.   Lactase (LACTAID PO) Take 1 tablet by mouth 2 (two) times daily as needed (stomach upset/ gas).    Multiple Vitamin (MULTIVITAMIN WITH MINERALS) TABS tablet Take 1 tablet by mouth daily.   telmisartan (MICARDIS) 80 MG tablet Take 1 tablet (80 mg total) by mouth daily.   donepezil (ARICEPT) 5 MG tablet Take 1 tablet (5 mg total) by mouth at bedtime. (Patient not taking: No sig reported)   No facility-administered encounter medications on file as of 11/11/2020.     REVIEW OF SYSTEMS  : All other systems reviewed and negative except where noted in the History of Present Illness.   PHYSICAL EXAM: BP 130/72   Pulse 68   Ht 5\' 2"  (1.575 m)   Wt 153 lb 9.6 oz (69.7 kg)   BMI 28.09 kg/m  General: Well developed AA female in no acute distress Head: Normocephalic and atraumatic Eyes:  Sclerae anicteric, conjunctiva pink. Ears: Normal auditory  acuity Lungs: Clear throughout to auscultation; no W/R/R. Heart: Regular rate and rhythm; no M/R/G. Abdomen: Soft, non-distended.  BS present.  Non-tender. Musculoskeletal: Symmetrical with no gross deformities  Skin: No lesions on visible extremities Extremities: No edema  Neurological: Alert oriented x 4, grossly non-focal Psychological:  Alert and cooperative. Normal mood and affect  ASSESSMENT AND PLAN: *Altered bowel habits: Patient tends to have constipation andhas been seen here for those issues in the past, but reports loose stools following an episode of hard stool each day.  Does not have multiple diarrheal episodes a day.  She says she wants infection to be  ruled out.  I think infectious source is very unlikely, but will proceed with stool culture and ova and parasites.  I have extremely low suspicion for C. difficile so we are not going to check for that.  I think that likely her underlying constipation is more so the issue and if she treated that even with daily MiraLAX then that in turn may prevent subsequent episodes of loose stools that follow.  She also eats a lot of ice cream and admits that ice cream and milk tend to give her loose stools.  We discussed trying to avoid those items to see if this issue gets better.  She reports that it may be hard to do because she really likes ice cream.  We will have her follow-up here with Dr. Hilarie Fredrickson his next available as she expressed desire to see him.   CC:  Binnie Rail, MD

## 2020-11-11 NOTE — Progress Notes (Signed)
Addendum: Reviewed and agree with assessment and management plan. [place]il[place] Mcphee M, MD  

## 2020-11-13 ENCOUNTER — Other Ambulatory Visit: Payer: Medicare Other

## 2020-11-13 DIAGNOSIS — K5900 Constipation, unspecified: Secondary | ICD-10-CM | POA: Diagnosis not present

## 2020-11-13 DIAGNOSIS — R197 Diarrhea, unspecified: Secondary | ICD-10-CM

## 2020-11-17 LAB — STOOL CULTURE: E coli, Shiga toxin Assay: NEGATIVE

## 2020-11-19 LAB — OVA AND PARASITE EXAMINATION
CONCENTRATE RESULT:: NONE SEEN
MICRO NUMBER:: 12069980
SPECIMEN QUALITY:: ADEQUATE
TRICHROME RESULT:: NONE SEEN

## 2020-12-12 ENCOUNTER — Telehealth: Payer: Self-pay | Admitting: Internal Medicine

## 2020-12-12 ENCOUNTER — Other Ambulatory Visit: Payer: Self-pay

## 2020-12-12 MED ORDER — ATENOLOL 25 MG PO TABS: 25.0000 mg | ORAL_TABLET | Freq: Every day | ORAL | 1 refills | Status: DC

## 2020-12-12 NOTE — Telephone Encounter (Signed)
Script sent in today. 

## 2020-12-12 NOTE — Telephone Encounter (Signed)
Patient calling for refill for atenolol (TENORMIN) 25 MG tablet   [facility] [id number] - [place], [place]

## 2021-01-22 IMAGING — MR MRI HEAD WITHOUT CONTRAST
10 series · 48 of 48 positions shown · non-contrast
Comparison: None.

CLINICAL DATA: Memory loss. Episodes of diplopia for 1 year.
Intermittent right hand numbness for 2 years.

EXAM:
MRI HEAD WITHOUT CONTRAST
TECHNIQUE: Multiplanar, multiecho pulse sequences of the brain and surrounding
structures were obtained without intravenous contrast.

[Series 2: t1_se_sag · sagittal · 5.0mm · 0.45mm/px · 2 of 21 slices shown]
[im 1/21]
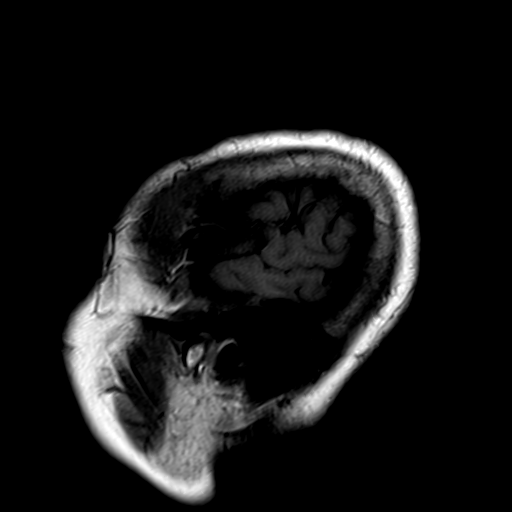
[im 21/21]
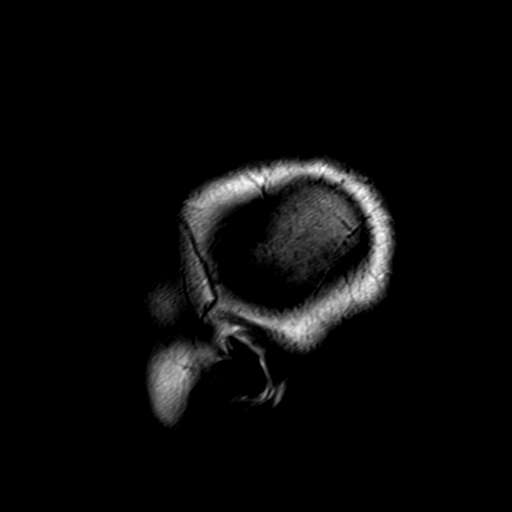

[Series 3: t2_tse_tra_512 · axial · 5.0mm · 0.60mm/px · z∈[-42,+93]mm · 2 of 24 slices shown]
[im 1/24]
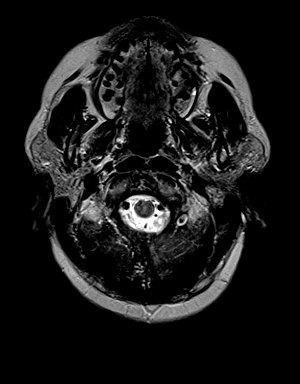
[im 24/24]
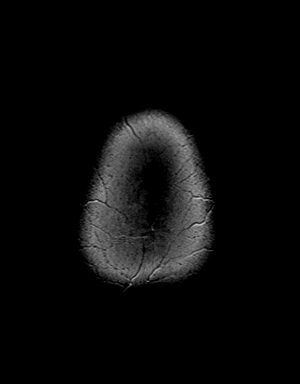

[Series 4: ep2d_diff_(id)_trace · axial · 3.0mm · 1.80mm/px · z∈[-44,+96]mm · 9 of 97 slices shown]
[im 1/97]
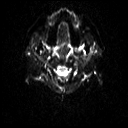
[im 13/97]
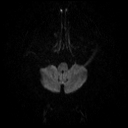
[im 25/97]
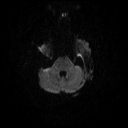
[im 37/97]
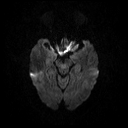
[im 49/97]
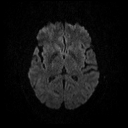
[im 61/97]
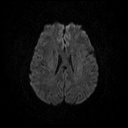
[im 73/97]
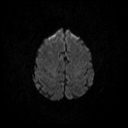
[im 85/97]
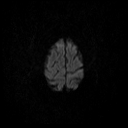
[im 97/97]
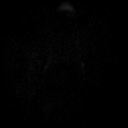

[Series 5: ep2d_diff_(id)_trace_adc · axial · 3.0mm · 1.80mm/px · z∈[-44,+96]mm · 5 of 49 slices shown]
[im 1/49]
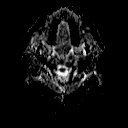
[im 13/49]
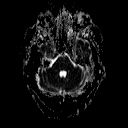
[im 25/49]
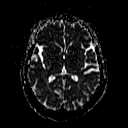
[im 37/49]
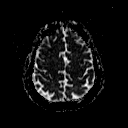
[im 49/49]
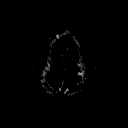

[Series 6: ep2d_diff_cor · coronal · 5.0mm · 1.77mm/px · 5 of 47 slices shown]
[im 1/47]
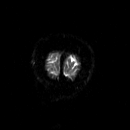
[im 12/47]
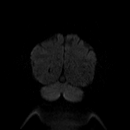
[im 24/47]
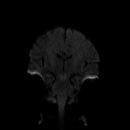
[im 35/47]
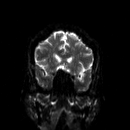
[im 47/47]
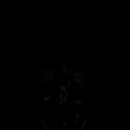

[Series 7: ep2d_diff_cor_adc · coronal · 5.0mm · 1.77mm/px · 2 of 25 slices shown]
[im 1/25]
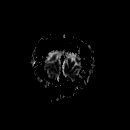
[im 25/25]
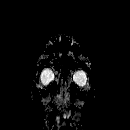

[Series 9: swi_images · axial · 4.0mm · 0.90mm/px · z∈[-43,+94]mm · 3 of 36 slices shown]
[im 1/36]
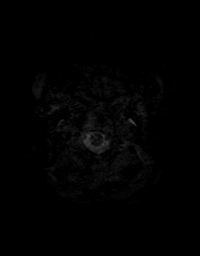
[im 18/36]
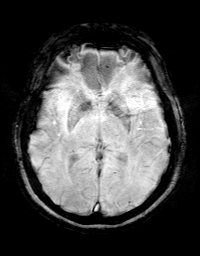
[im 36/36]
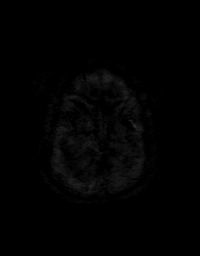

[Series 10: FLAIR · axial · 3.0mm · 0.43mm/px · z∈[-51,+101]mm · 3 of 27 slices shown]
[im 1/27]
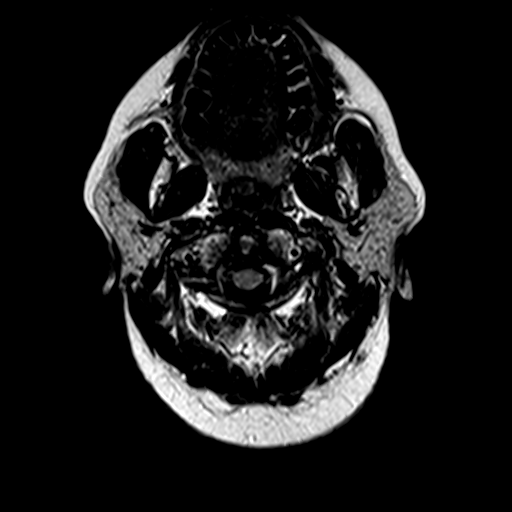
[im 14/27]
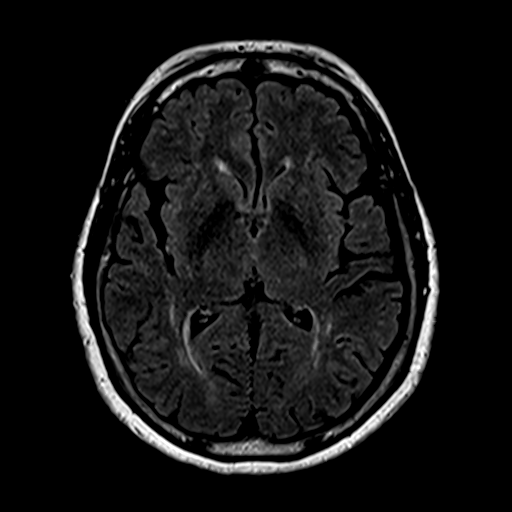
[im 27/27]
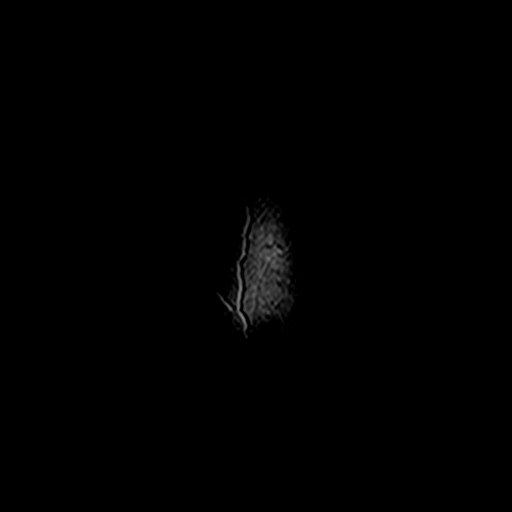

[Series 11: t1_mpr_tra · axial · 1.0mm · 0.72mm/px · z∈[-44,+96]mm · 14 of 144 slices shown]
[im 1/144]
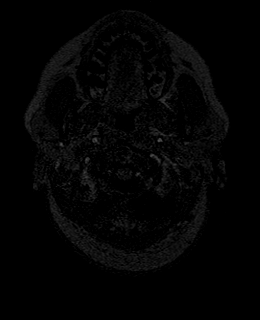
[im 12/144]
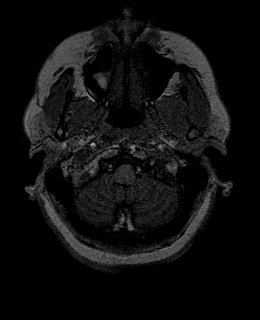
[im 23/144]
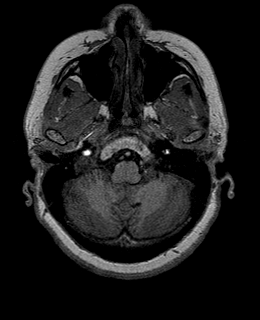
[im 34/144]
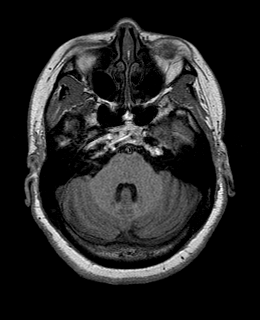
[im 45/144]
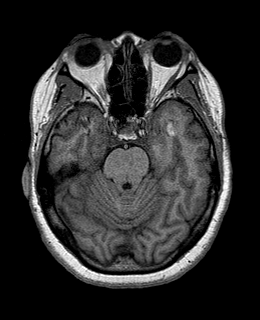
[im 56/144]
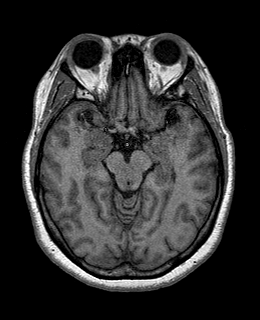
[im 67/144]
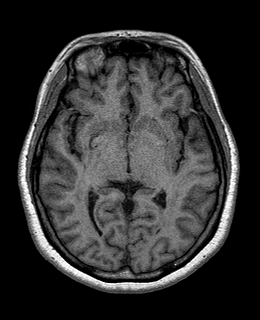
[im 78/144]
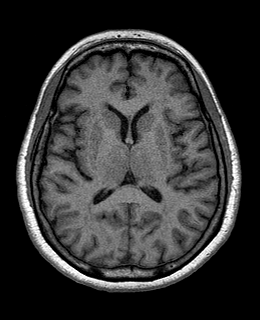
[im 89/144]
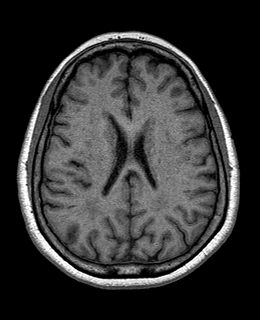
[im 100/144]
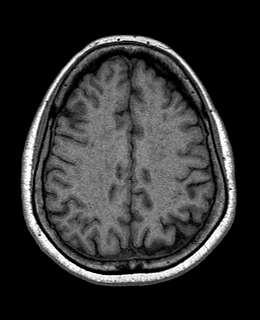
[im 111/144]
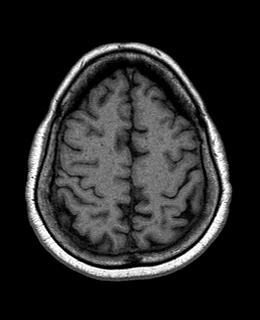
[im 122/144]
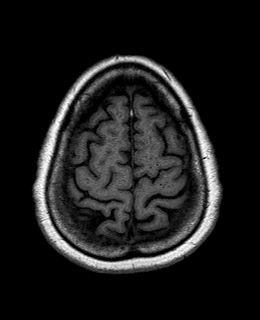
[im 133/144]
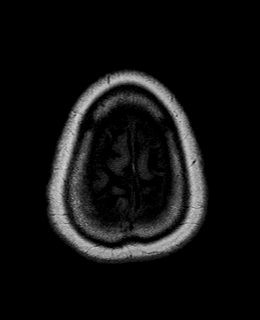
[im 144/144]
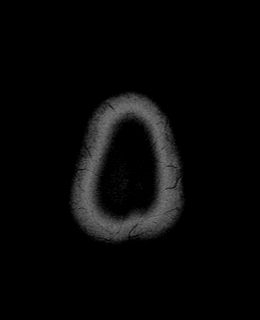

[Series 12: T2 · coronal · 5.0mm · 0.45mm/px · 3 of 26 slices shown]
[im 1/26]
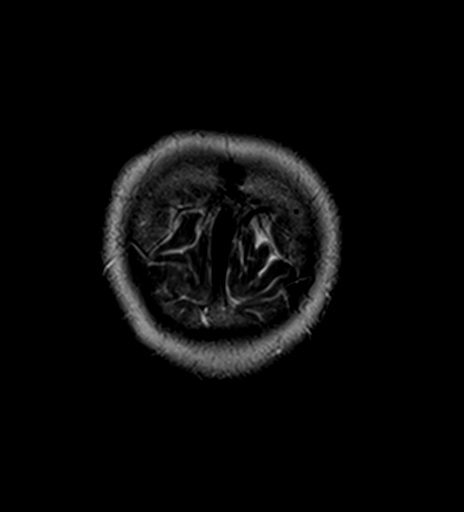
[im 13/26]
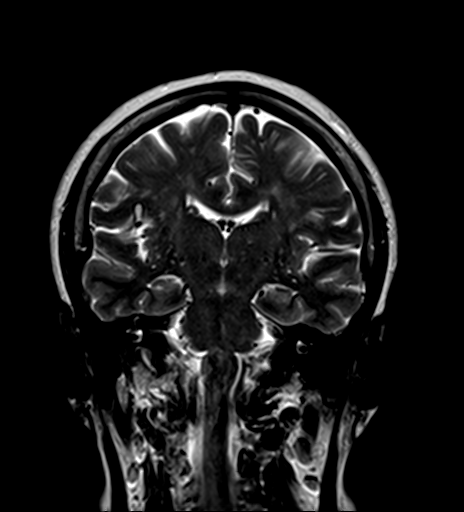
[im 26/26]
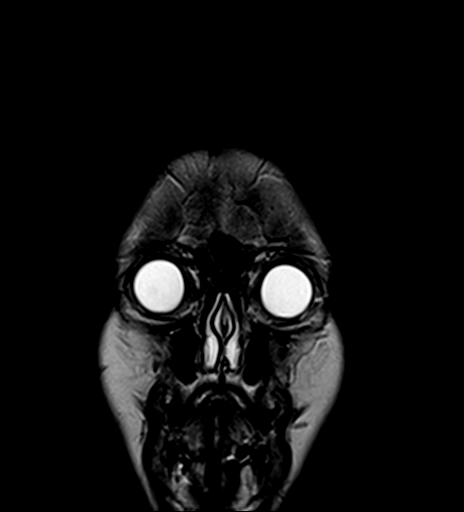

[48 of 48 positions shown; findings below may reference images not displayed]

FINDINGS: Brain: There is no evidence of acute infarct, intracranial
hemorrhage, mass, midline shift, or extra-axial fluid collection.
The ventricles and sulci are within normal limits without evidence
of age advanced or lobar predominant atrophy. Patchy T2
hyperintensities in the cerebral white matter bilaterally are
moderately advanced for age.

Vascular: Major intracranial vascular flow voids are preserved.

Skull and upper cervical spine: Unremarkable bone marrow signal.

Sinuses/Orbits: Unremarkable orbits. Small right maxillary sinus
mucous retention cyst. Clear mastoid air cells.

Other: None.
IMPRESSION: 1. No acute intracranial abnormality.
2. Moderately advanced cerebral white matter T2 signal changes,
nonspecific though compatible with chronic small vessel ischemic
disease.

## 2021-02-03 ENCOUNTER — Ambulatory Visit: Payer: Medicare Other | Admitting: Internal Medicine

## 2021-02-04 ENCOUNTER — Other Ambulatory Visit: Payer: Self-pay

## 2021-02-04 MED ORDER — TELMISARTAN 80 MG PO TABS: 80.0000 mg | ORAL_TABLET | Freq: Every day | ORAL | 2 refills | Status: DC

## 2021-02-08 NOTE — Patient Instructions (Addendum)
Blood work was ordered.     Medications changes include :   none    Please followup in 6 months   You should consider getting the shingles vaccine and covid booster at your pharmacy.      Health Maintenance, Female Adopting a healthy lifestyle and getting preventive care are important in promoting health and wellness. Ask your health care provider about: The right schedule for you to have regular tests and exams. Things you can do on your own to prevent diseases and keep yourself healthy. What should I know about diet, weight, and exercise? Eat a healthy diet  Eat a diet that includes plenty of vegetables, fruits, low-fat dairy products, and lean protein. Do not eat a lot of foods that are high in solid fats, added sugars, or sodium. Maintain a healthy weight Body mass index (BMI) is used to identify weight problems. It estimates body fat based on height and weight. Your health care provider can help determine your BMI and help you achieve or maintain a healthy weight. Get regular exercise Get regular exercise. This is one of the most important things you can do for your health. Most adults should: Exercise for at least 150 minutes each week. The exercise should increase your heart rate and make you sweat (moderate-intensity exercise). Do strengthening exercises at least twice a week. This is in addition to the moderate-intensity exercise. Spend less time sitting. Even light physical activity can be beneficial. Watch cholesterol and blood lipids Have your blood tested for lipids and cholesterol at 68 years of age, then have this test every 5 years. Have your cholesterol levels checked more often if: Your lipid or cholesterol levels are high. You are older than 68 years of age. You are at high risk for heart disease. What should I know about cancer screening? Depending on your health history and family history, you may need to have cancer screening at various ages. This may  include screening for: Breast cancer. Cervical cancer. Colorectal cancer. Skin cancer. Lung cancer. What should I know about heart disease, diabetes, and high blood pressure? Blood pressure and heart disease High blood pressure causes heart disease and increases the risk of stroke. This is more likely to develop in people who have high blood pressure readings, are of African descent, or are overweight. Have your blood pressure checked: Every 3-5 years if you are 63-49 years of age. Every year if you are 42 years old or older. Diabetes Have regular diabetes screenings. This checks your fasting blood sugar level. Have the screening done: Once every three years after age 61 if you are at a normal weight and have a low risk for diabetes. More often and at a younger age if you are overweight or have a high risk for diabetes. What should I know about preventing infection? Hepatitis B If you have a higher risk for hepatitis B, you should be screened for this virus. Talk with your health care provider to find out if you are at risk for hepatitis B infection. Hepatitis C Testing is recommended for: Everyone born from [birth year] through 1965. Anyone with known risk factors for hepatitis C. Sexually transmitted infections (STIs) Get screened for STIs, including gonorrhea and chlamydia, if: You are sexually active and are younger than 68 years of age. You are older than 68 years of age and your health care provider tells you that you are at risk for this type of infection. Your sexual activity has changed since you were  last screened, and you are at increased risk for chlamydia or gonorrhea. Ask your health care provider if you are at risk. Ask your health care provider about whether you are at high risk for HIV. Your health care provider may recommend a prescription medicine to help prevent HIV infection. If you choose to take medicine to prevent HIV, you should first get tested for HIV. You should then  be tested every 3 months for as long as you are taking the medicine. Pregnancy If you are about to stop having your period (premenopausal) and you may become pregnant, seek counseling before you get pregnant. Take 400 to 800 micrograms (mcg) of folic acid every day if you become pregnant. Ask for birth control (contraception) if you want to prevent pregnancy. Osteoporosis and menopause Osteoporosis is a disease in which the bones lose minerals and strength with aging. This can result in bone fractures. If you are 61 years old or older, or if you are at risk for osteoporosis and fractures, ask your health care provider if you should: Be screened for bone loss. Take a calcium or vitamin D supplement to lower your risk of fractures. Be given hormone replacement therapy (HRT) to treat symptoms of menopause. Follow these instructions at home: Lifestyle Do not use any products that contain nicotine or tobacco, such as cigarettes, e-cigarettes, and chewing tobacco. If you need help quitting, ask your health care provider. Do not use street drugs. Do not share needles. Ask your health care provider for help if you need support or information about quitting drugs. Alcohol use Do not drink alcohol if: Your health care provider tells you not to drink. You are pregnant, may be pregnant, or are planning to become pregnant. If you drink alcohol: Limit how much you use to 0-1 drink a day. Limit intake if you are breastfeeding. Be aware of how much alcohol is in your drink. In the [place] one drink equals one 12 oz bottle of beer (355 mL), one 5 oz glass of wine (148 mL), or one 1 oz glass of hard liquor (44 mL). General instructions Schedule regular health, dental, and eye exams. Stay current with your vaccines. Tell your health care provider if: You often feel depressed. You have ever been abused or do not feel safe at home. Summary Adopting a healthy lifestyle and getting preventive care are  important in promoting health and wellness. Follow your health care provider's instructions about healthy diet, exercising, and getting tested or screened for diseases. Follow your health care provider's instructions on monitoring your cholesterol and blood pressure. This information is not intended to replace advice given to you by your health care provider. Make sure you discuss any questions you have with your health care provider. Document Revised: 07/11/2020 Document Reviewed: 04/26/2018 Elsevier Patient Education  2022 [employer].

## 2021-02-08 NOTE — Progress Notes (Signed)
Subjective:    Patient ID: Karen Evans, female    DOB: 29-Sep-1950, 70 y.o.   MRN: [id number]   This visit occurred during the SARS-CoV-2 public health emergency.  Safety protocols were in place, including screening questions prior to the visit, additional usage of staff PPE, and extensive cleaning of exam room while observing appropriate contact time as indicated for disinfecting solutions.    HPI She is here for a physical exam.   She is doing well and has no concerns.   Medications and allergies reviewed with patient and updated if appropriate.  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     atenolol (TENORMIN) 25 MG tablet Take 1 tablet (25 mg total) by mouth daily. 90 tablet 1   Calcium Carbonate-Vitamin D (CALTRATE 600+D PO) Take 1 tablet by mouth daily.      cholecalciferol (VITAMIN D) 1000 units tablet Take 1,000 Units by mouth daily.     ezetimibe (ZETIA) 10 MG tablet Take 1 tablet (10 mg total) by mouth daily. 90 tablet 3   hydrocortisone (ANUSOL-HC)  25 MG suppository Place 25 mg rectally as needed for hemorrhoids or anal itching.     Lactase (LACTAID PO) Take 1 tablet by mouth 2 (two) times daily as needed (stomach upset/ gas).      Multiple Vitamin (MULTIVITAMIN WITH MINERALS) TABS tablet Take 1 tablet by mouth daily.     telmisartan (MICARDIS) 80 MG tablet Take 1 tablet (80 mg total) by mouth daily. 90 tablet 2   donepezil (ARICEPT) 5 MG tablet Take 1 tablet (5 mg total) by mouth at bedtime. (Patient not taking: No sig reported) 30 tablet 11   No current facility-administered medications on file prior to visit.    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   Heart murmur    Hiatal hernia 2002   EGD   Hypertension    Internal hemorrhoids    Migraines    when takes vitamins    Rectal polyp 01/24/2012   TUBULAR ADENOMA   Reflux esophagitis 2007   EGD   Stricture and stenosis of esophagus 2007   EGD   Tubular adenoma of colon    Ulcer    Vitamin D deficiency  Past Surgical History:  Procedure Laterality Date   COLONOSCOPY     polyps;granular ileitis 2006;2007 normal, Dr Sharlett Iles   HEMORRHOID SURGERY     OPERATIVE HYSTEROSCOPY  08-05-04   Dr Garwin Brothers   OVARIAN CYST REMOVAL     POLYPECTOMY     UPPER GASTROINTESTINAL ENDOSCOPY      Social History   Socioeconomic History   Marital status: Single    Spouse name: Not on file   Number of children: 0   Years of education: Not on file   Highest education level: Not on file  Occupational History   Occupation: TESTER    Employer: [employer]  Tobacco Use   Smoking status: Never   Smokeless tobacco: Never  Vaping Use   Vaping Use: Never used  Substance and Sexual Activity   Alcohol use: No   Drug use: No    Sexual activity: Not on file  Other Topics Concern   Not on file  Social History Narrative   Right handed      Some College   Social Determinants of Health   Financial Resource Strain: Not on file  Food Insecurity: Not on file  Transportation Needs: Not on file  Physical Activity: Not on file  Stress: Not on file  Social Connections: Not on file    Family History  Problem Relation Age of Onset   Lymphoma Father    Diabetes Mother    Hypertension Mother    Heart disease Mother    Penile cancer Paternal Grandfather    Diabetes Brother        CAD; Dialysis   Hypertension Brother    Heart attack Other        1/2 maternal aunts X 2   Rectal cancer Paternal Grandmother    Colon cancer Maternal Grandmother    Cancer Other    COPD Neg Hx    Esophageal cancer Neg Hx    Stomach cancer Neg Hx    Stroke Neg Hx     Review of Systems  Constitutional:  Negative for chills and fever.  Eyes:  Negative for visual disturbance.  Respiratory:  Negative for cough, shortness of breath and wheezing.   Cardiovascular:  Negative for chest pain, palpitations and leg swelling.  Gastrointestinal:  Negative for abdominal pain, blood in stool, constipation and diarrhea.  Genitourinary:  Negative for dysuria.  Musculoskeletal:  Positive for arthralgias (occ hip). Negative for back pain.  Skin:  Negative for rash.  Neurological:  Negative for light-headedness and headaches.  Psychiatric/Behavioral:  Negative for dysphoric mood. The patient is not nervous/anxious.       Objective:   Vitals:   02/09/21 1414  BP: 108/70  Pulse: 64  Temp: 98.1 F (36.7 C)  SpO2: 99%   Filed Weights   02/09/21 1414  Weight: 151 lb 3.2 oz (68.6 kg)   Body mass index is 27.65 kg/m.  BP Readings from Last 3 Encounters:  02/09/21 108/70  11/11/20 130/72  09/19/20 124/74    Wt Readings from Last 3 Encounters:  02/09/21 151 lb 3.2 oz (68.6 kg)  11/11/20 153 lb 9.6 oz (69.7 kg)  09/19/20 152 lb  (68.9 kg)    Depression screen [facility] 2/9 02/09/2021 02/08/2020 02/08/2020 08/07/2019 03/14/2018  Decreased Interest 0 0 0 0 0  Down, Depressed, Hopeless 0 0 0 0 0  PHQ - 2 Score 0 0 0 0 0  Altered sleeping 0 - - - -  Tired, decreased energy 0 - - - -  Change in appetite 0 - - - -  Feeling bad or failure about yourself  0 - - - -  Trouble concentrating 0 - - - -  Moving slowly or fidgety/restless 0 - - - -  Suicidal thoughts 0 - - - -  PHQ-9 Score 0 - - - -  Some recent data might be hidden    GAD 7 : Generalized Anxiety Score 02/09/2021  Nervous, Anxious, on Edge 0  Control/stop worrying 0  Worry too much - different things 0  Trouble relaxing 0  Restless 0  Easily annoyed or irritable 0  Afraid - awful might happen 0  Total GAD 7 Score 0       Physical Exam Constitutional: She appears well-developed and well-nourished. No distress.  HENT:  Head: Normocephalic and atraumatic.  Right Ear: External ear normal. Normal ear canal and TM Left Ear: External ear normal.  Normal ear canal and TM Mouth/Throat: Oropharynx is clear and moist.  Eyes: Conjunctivae and EOM are normal.  Neck: Neck supple. No tracheal deviation present. No thyromegaly present.  No carotid bruit  Cardiovascular: Normal rate, regular rhythm and normal heart sounds.   No murmur heard.  No edema. Pulmonary/Chest: Effort normal and breath sounds normal. No respiratory distress. She has no wheezes. She has no rales.  Breast: deferred   Abdominal: Soft. She exhibits no distension. There is no tenderness.  Lymphadenopathy: She has no cervical adenopathy.  Skin: Skin is warm and dry. She is not diaphoretic.  Psychiatric: She has a normal mood and affect. Her behavior is normal.     Lab Results  Component Value Date   WBC 7.3 02/08/2020   HGB 13.2 02/08/2020   HCT 42.0 02/08/2020   PLT 272 02/08/2020   GLUCOSE 78 09/19/2020   CHOL 236 (H) 09/19/2020   TRIG 164.0 (H) 09/19/2020   HDL 64.30 09/19/2020    LDLDIRECT 173.0 08/11/2020   LDLCALC 139 (H) 09/19/2020   ALT 16 09/19/2020   AST 16 09/19/2020   NA 139 09/19/2020   K 3.7 09/19/2020   CL 103 09/19/2020   CREATININE 0.76 09/19/2020   BUN 14 09/19/2020   CO2 32 09/19/2020   TSH 0.46 09/19/2020   HGBA1C 6.1 08/11/2020         Assessment & Plan:   Physical exam: Screening blood work  ordered Exercise   walks once a week Weight  ok for age Substance abuse  none   Screened for depression using the PHQ 9 scale.  No evidence of depression.   Screened for anxiety using GAD7 Scale.  No evidence of anxiety.     Reviewed recommended immunizations.   Health Maintenance  Topic Date Due   COVID-19 Vaccine (3 - Booster for Pfizer series) 02/25/2021 (Originally 12/30/2019)   Zoster Vaccines- Shingrix (1 of 2) 05/11/2021 (Originally 11/27/2002)   TETANUS/TDAP  08/11/2021 (Originally 12/28/2017)   INFLUENZA VACCINE  08/14/2021 (Originally 12/15/2020)   DEXA SCAN  02/09/2022 (Originally 07/21/2020)   MAMMOGRAM  04/04/2021   COLONOSCOPY (Pts 45-85yrs Insurance coverage will need to be confirmed)  07/29/2021   Hepatitis C Screening  Completed   HPV VACCINES  Aged Out          See Problem List for Assessment and Plan of chronic medical problems.

## 2021-02-09 ENCOUNTER — Encounter: Payer: Self-pay | Admitting: Internal Medicine

## 2021-02-09 ENCOUNTER — Other Ambulatory Visit: Payer: Self-pay

## 2021-02-09 ENCOUNTER — Ambulatory Visit (INDEPENDENT_AMBULATORY_CARE_PROVIDER_SITE_OTHER): Payer: Medicare Other | Admitting: Internal Medicine

## 2021-02-09 VITALS — BP 108/70 | HR 64 | Temp 98.1°F | Ht 62.0 in | Wt 151.2 lb

## 2021-02-09 DIAGNOSIS — Z0000 Encounter for general adult medical examination without abnormal findings: Secondary | ICD-10-CM

## 2021-02-09 DIAGNOSIS — M85852 Other specified disorders of bone density and structure, left thigh: Secondary | ICD-10-CM

## 2021-02-09 DIAGNOSIS — Z1389 Encounter for screening for other disorder: Secondary | ICD-10-CM

## 2021-02-09 DIAGNOSIS — R7303 Prediabetes: Secondary | ICD-10-CM

## 2021-02-09 DIAGNOSIS — Z1331 Encounter for screening for depression: Secondary | ICD-10-CM

## 2021-02-09 DIAGNOSIS — I10 Essential (primary) hypertension: Secondary | ICD-10-CM | POA: Diagnosis not present

## 2021-02-09 DIAGNOSIS — E7849 Other hyperlipidemia: Secondary | ICD-10-CM

## 2021-02-09 LAB — COMPREHENSIVE METABOLIC PANEL
ALT: 15 U/L (ref 0–35)
AST: 18 U/L (ref 0–37)
Albumin: 3.8 g/dL (ref 3.5–5.2)
Alkaline Phosphatase: 67 U/L (ref 39–117)
BUN: 12 mg/dL (ref 6–23)
CO2: 29 mEq/L (ref 19–32)
Calcium: 10 mg/dL (ref 8.4–10.5)
Chloride: 104 mEq/L (ref 96–112)
Creatinine, Ser: 0.73 mg/dL (ref 0.40–1.20)
GFR: 84.63 mL/min (ref >60.00)
Glucose, Bld: 103 mg/dL — ABNORMAL HIGH (ref 70–99)
Potassium: 3.7 mEq/L (ref 3.5–5.1)
Sodium: 139 mEq/L (ref 135–145)
Total Bilirubin: 0.4 mg/dL (ref 0.2–1.2)
Total Protein: 7 g/dL (ref 6.0–8.3)

## 2021-02-09 LAB — CBC WITH DIFFERENTIAL/PLATELET
Basophils Absolute: 0 10*3/uL (ref 0.0–0.1)
Basophils Relative: 0.7 % (ref 0.0–3.0)
Eosinophils Absolute: 0.1 10*3/uL (ref 0.0–0.7)
Eosinophils Relative: 2.1 % (ref 0.0–5.0)
HCT: 39.3 % (ref 36.0–46.0)
Hemoglobin: 12.5 g/dL (ref 12.0–15.0)
Lymphocytes Relative: 28.3 % (ref 12.0–46.0)
Lymphs Abs: 1.8 10*3/uL (ref 0.7–4.0)
MCHC: 31.8 g/dL (ref 30.0–36.0)
MCV: 81.2 fl (ref 78.0–100.0)
Monocytes Absolute: 0.6 10*3/uL (ref 0.1–1.0)
Monocytes Relative: 9.9 % (ref 3.0–12.0)
Neutro Abs: 3.7 10*3/uL (ref 1.4–7.7)
Neutrophils Relative %: 59 % (ref 43.0–77.0)
Platelets: 238 10*3/uL (ref 150.0–400.0)
RBC: 4.84 Mil/uL (ref 3.87–5.11)
RDW: 14 % (ref 11.5–15.5)
WBC: 6.3 10*3/uL (ref 4.0–10.5)

## 2021-02-09 LAB — LIPID PANEL
Cholesterol: 232 mg/dL — ABNORMAL HIGH (ref 0–200)
HDL: 62.7 mg/dL (ref >39.00)
NonHDL: 169.7
Total CHOL/HDL Ratio: 4
Triglycerides: 202 mg/dL — ABNORMAL HIGH (ref 0.0–149.0)
VLDL: 40.4 mg/dL — ABNORMAL HIGH (ref 0.0–40.0)

## 2021-02-09 LAB — TSH: TSH: 0.48 u[IU]/mL (ref 0.35–5.50)

## 2021-02-09 LAB — LDL CHOLESTEROL, DIRECT: Direct LDL: 139 mg/dL

## 2021-02-09 LAB — HEMOGLOBIN A1C: Hgb A1c MFr Bld: 6.2 % (ref 4.6–6.5)

## 2021-02-09 NOTE — Assessment & Plan Note (Signed)
Chronic Check lipid panel  Continue zetia 10 mg daily Regular exercise and healthy diet encouraged  

## 2021-02-09 NOTE — Assessment & Plan Note (Signed)
Chronic BP well controlled Continue atenolol 25 mg daily, telmisartan 80 mg daily cmp

## 2021-02-09 NOTE — Assessment & Plan Note (Signed)
Chronic Check a1c Low sugar / carb diet Stressed regular exercise  

## 2021-02-09 NOTE — Addendum Note (Signed)
Addended by: Boris Lown B on: 02/09/2021 02:55 PM   Modules accepted: Orders

## 2021-02-09 NOTE — Assessment & Plan Note (Signed)
Chronic  dexa deferred by pt Stressed regular exercise Continue calcium and vitamin d

## 2021-02-20 DIAGNOSIS — Z1231 Encounter for screening mammogram for malignant neoplasm of breast: Secondary | ICD-10-CM | POA: Diagnosis not present

## 2021-02-20 LAB — HM MAMMOGRAPHY

## 2021-02-26 ENCOUNTER — Ambulatory Visit: Payer: Medicare Other | Admitting: Physician Assistant

## 2021-02-26 ENCOUNTER — Other Ambulatory Visit: Payer: Self-pay

## 2021-02-26 ENCOUNTER — Encounter: Payer: Self-pay | Admitting: Physician Assistant

## 2021-02-26 VITALS — BP 129/81 | HR 69 | Resp 18 | Ht 63.0 in | Wt 153.0 lb

## 2021-02-26 DIAGNOSIS — F01A0 Vascular dementia, mild, without behavioral disturbance, psychotic disturbance, mood disturbance, and anxiety: Secondary | ICD-10-CM | POA: Diagnosis not present

## 2021-02-26 NOTE — Patient Instructions (Signed)
It was a pleasure to see you today at our office.   Recommendations:  Meds: Follow up in 6 months Start Donepezil (Aricept) 5 mg at bedtime Call your primary doctor to adjust your blood pressure medications    RECOMMENDATIONS FOR ALL PATIENTS WITH MEMORY PROBLEMS: 1. Continue to exercise (Recommend 30 minutes of walking everyday, or 3 hours every week) 2. Increase social interactions - continue going to [place] and enjoy social gatherings with friends and family 3. Eat healthy, avoid fried foods and eat more fruits and vegetables 4. Maintain adequate blood pressure, blood sugar, and blood cholesterol level. Reducing the risk of stroke and cardiovascular disease also helps promoting better memory. 5. Avoid stressful situations. Live a simple life and avoid aggravations. Organize your time and prepare for the next day in anticipation. 6. Sleep well, avoid any interruptions of sleep and avoid any distractions in the bedroom that may interfere with adequate sleep quality 7. Avoid sugar, avoid sweets as there is a strong link between excessive sugar intake, diabetes, and cognitive impairment We discussed the Mediterranean diet, which has been shown to help patients reduce the risk of progressive memory disorders and reduces cardiovascular risk. This includes eating fish, eat fruits and green leafy vegetables, nuts like almonds and hazelnuts, walnuts, and also use olive oil. Avoid fast foods and fried foods as much as possible. Avoid sweets and sugar as sugar use has been linked to worsening of memory function.  There is always a concern of gradual progression of memory problems. If this is the case, then we may need to adjust level of care according to patient needs. Support, both to the patient and caregiver, should then be put into place.    FALL PRECAUTIONS: Be cautious when walking. Scan the area for obstacles that may increase the risk of trips and falls. When getting up in the mornings, sit up  at the edge of the bed for a few minutes before getting out of bed. Consider elevating the bed at the head end to avoid drop of blood pressure when getting up. Walk always in a well-lit room (use night lights in the walls). Avoid area rugs or power cords from appliances in the middle of the walkways. Use a walker or a cane if necessary and consider physical therapy for balance exercise. Get your eyesight checked regularly.   HOME SAFETY: Consider the safety of the kitchen when operating appliances like stoves, microwave oven, and blender. Consider having supervision and share cooking responsibilities until no longer able to participate in those. Accidents with firearms and other hazards in the house should be identified and addressed as well       Mediterranean Diet A Mediterranean diet refers to food and lifestyle choices that are based on the traditions of countries located on the [employer]. This way of eating has been shown to help prevent certain conditions and improve outcomes for people who have chronic diseases, like kidney disease and heart disease. What are tips for following this plan? Lifestyle  Cook and eat meals together with your family, when possible. Drink enough fluid to keep your urine clear or pale yellow. Be physically active every day. This includes: Aerobic exercise like running or swimming. Leisure activities like gardening, walking, or housework. Get 7-8 hours of sleep each night. If recommended by your health care provider, drink red wine in moderation. This means 1 glass a day for nonpregnant women and 2 glasses a day for men. A glass of wine equals 5 oz (  150 mL). Reading food labels  Check the serving size of packaged foods. For foods such as rice and pasta, the serving size refers to the amount of cooked product, not dry. Check the total fat in packaged foods. Avoid foods that have saturated fat or trans fats. Check the ingredients list for added sugars,  such as corn syrup. Shopping  At the grocery store, buy most of your food from the areas near the walls of the store. This includes: Fresh fruits and vegetables (produce). Grains, beans, nuts, and seeds. Some of these may be available in unpackaged forms or large amounts (in bulk). Fresh seafood. Poultry and eggs. Low-fat dairy products. Buy whole ingredients instead of prepackaged foods. Buy fresh fruits and vegetables in-season from local farmers markets. Buy frozen fruits and vegetables in resealable bags. If you do not have access to quality fresh seafood, buy precooked frozen shrimp or canned fish, such as tuna, salmon, or sardines. Buy small amounts of raw or cooked vegetables, salads, or olives from the deli or salad bar at your store. Stock your pantry so you always have certain foods on hand, such as olive oil, canned tuna, canned tomatoes, rice, pasta, and beans. Cooking  Cook foods with extra-virgin olive oil instead of using butter or other vegetable oils. Have meat as a side dish, and have vegetables or grains as your main dish. This means having meat in small portions or adding small amounts of meat to foods like pasta or stew. Use beans or vegetables instead of meat in common dishes like chili or lasagna. Experiment with different cooking methods. Try roasting or broiling vegetables instead of steaming or sauteing them. Add frozen vegetables to soups, stews, pasta, or rice. Add nuts or seeds for added healthy fat at each meal. You can add these to yogurt, salads, or vegetable dishes. Marinate fish or vegetables using olive oil, lemon juice, garlic, and fresh herbs. Meal planning  Plan to eat 1 vegetarian meal one day each week. Try to work up to 2 vegetarian meals, if possible. Eat seafood 2 or more times a week. Have healthy snacks readily available, such as: Vegetable sticks with hummus. Greek yogurt. Fruit and nut trail mix. Eat balanced meals throughout the week. This  includes: Fruit: 2-3 servings a day Vegetables: 4-5 servings a day Low-fat dairy: 2 servings a day Fish, poultry, or lean meat: 1 serving a day Beans and legumes: 2 or more servings a week Nuts and seeds: 1-2 servings a day Whole grains: 6-8 servings a day Extra-virgin olive oil: 3-4 servings a day Limit red meat and sweets to only a few servings a month What are my food choices? Mediterranean diet Recommended Grains: Whole-grain pasta. Brown rice. Bulgar wheat. Polenta. Couscous. Whole-wheat bread. [place]. Vegetables: Artichokes. Beets. Broccoli. Cabbage. Carrots. Eggplant. Green beans. Chard. Kale. Spinach. Onions. Leeks. Peas. Squash. Tomatoes. Peppers. Radishes. Fruits: Apples. Apricots. Avocado. Berries. Bananas. Cherries. Dates. Figs. Grapes. Lemons. Melon. Oranges. Peaches. Plums. Pomegranate. Meats and other protein foods: Beans. Almonds. Sunflower seeds. Pine nuts. Peanuts. [place]. Salmon. Scallops. Shrimp. [place]. Tilapia. Clams. Oysters. Eggs. Dairy: Low-fat milk. Cheese. Greek yogurt. Beverages: Water. Red wine. Herbal tea. Fats and oils: Extra virgin olive oil. Avocado oil. Grape seed oil. Sweets and desserts: [place] yogurt with honey. Baked apples. Poached pears. Trail mix. Seasoning and other foods: Basil. Cilantro. Coriander. Cumin. Mint. Parsley. Sage. Rosemary. Tarragon. Garlic. Oregano. Thyme. Pepper. Balsalmic vinegar. Tahini. Hummus. Tomato sauce. Olives. Mushrooms. Limit these Grains: Prepackaged pasta or rice dishes. Prepackaged  cereal with added sugar. Vegetables: Deep fried potatoes (french fries). Fruits: Fruit canned in syrup. Meats and other protein foods: Beef. Pork. Lamb. Poultry with skin. Hot dogs. [place] Salines. Dairy: Ice cream. Sour cream. Whole milk. Beverages: Juice. Sugar-sweetened soft drinks. Beer. Liquor and spirits. Fats and oils: Butter. Canola oil. Vegetable oil. Beef fat (tallow). Lard. Sweets and desserts: Cookies. Cakes. Pies. Candy. Seasoning  and other foods: Mayonnaise. Premade sauces and marinades. The items listed may not be a complete list. Talk with your dietitian about what dietary choices are right for you. Summary The Mediterranean diet includes both food and lifestyle choices. Eat a variety of fresh fruits and vegetables, beans, nuts, seeds, and whole grains. Limit the amount of red meat and sweets that you eat. Talk with your health care provider about whether it is safe for you to drink red wine in moderation. This means 1 glass a day for nonpregnant women and 2 glasses a day for men. A glass of wine equals 5 oz (150 mL). This information is not intended to replace advice given to you by your health care provider. Make sure you discuss any questions you have with your health care provider. Document Released: 12/25/2015 Document Revised: 01/27/2016 Document Reviewed: 12/25/2015 Elsevier Interactive Patient Education  2017 [employer].

## 2021-02-26 NOTE — Progress Notes (Signed)
Assessment/Plan:   Mild vascular neurocognitive disorder  Discussed safety both in and out of the home. Discussed the importance of regular daily schedule with inclusion of crossword puzzles to maintain brain function.  Continue to monitor mood with PCP.  Continue to stay active exercising  at least 30 minutes at least 3 times a week.  Naps should be scheduled and should be no longer than 60 minutes and should not occur after 2 PM.  Mediterranean diet is recommended  Follow up in 6 months Start Donepezil 5 mg qhs. Side effects and benefits were discussed  Continue to monitor her blood pressure by PCP     Case discussed with Dr. Delice Lesch who agrees with the plan     Subjective:   Karen Evans was seen today in follow up for memory loss.  She is a 70 year old woman with a history of hypertension, hyperlipidemia, mild vascular neurocognitive disorder who returns today for evaluation.  She was last seen on 08/26/2020  Her MMSE score today is 25/30.  Donepezil 5 mg nightly was recommended, but the patient was hesitant to start.  She has filled the prescription, but has not started it.  She is again alone in the office without her family to help corroborate her history.  She lives alone, and occasionally, [place] [facility] comes to check on her. The patient reports feeling well, has not been very active, feels that her "memory is stable".  She watches a lot of TV, currently goes out and walk, she does go to church.  She also draws, and paints. She denies any headaches, dizziness trauma, or injuries to the head or falls.  She denies any numbness or tingling.  Denies any unilateral weakness.  She denies any double or blurred vision.  She denies any hallucinations or paranoia.  She denies any vivid dreams.  She sleeps well, wakes up very few times at night.  She takes occasionally a nap during the day.  She dresses and takes baths without help.  She denies living objects in unusual places, she cooks,  and has not been leaving the stove on.   She walks without a walker.  Appetite is good, denies any diarrhea, occasional constipation, denies any trouble swallowing.  No incontinence or retention  Her mood is good, without depression, irritability or depression.  She drives, and denies having gotten lost, paying  close attention to the signals.   History on Initial Assessment 06/07/2018: This is a 70 year old right-handed woman with a history of hypertension, hyperlipidemia, presenting for evaluation of memory concerns. She reports that she started noticing memory changes "when they started letting people go at the plant in 2016." She noticed she was forgetting things. She reported symptoms to Dr. Quay Burow in November 2019 where she would leave food out without putting it away, one time she left ice cream container out after eating it, and left a doggie bag or groceries in her car. She has burnt food she forgot was cooking and now has to make herself stay in the kitchen. She lost her keys one time and has started putting them in one place. She denies getting lost driving. No missed bills or medications. 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 She lives alone, her family (mother and brother) live in [place]. She has told them of her memory concerns, "they don't think I need to come here." Her maternal aunt had  memory issues. No history of concussions or alcohol use.   She has occasional right hand and left finger tingling. She denies any headaches, dizziness, diplopia, dysarthria/dysphagia, neck/back pain, bowel/bladder dysfunction, anosmia, or tremors. No falls. Sleep is good. Mood "I think it's good." There is no family to corroborate history but there does not appear to be any paranoia or hallucinations.    Diagnostic Data: MRI brain without contrast done 07/2018 which did not show any acute changes. There was moderately advanced  chronic microvascular disease.    PREVIOUS MEDICATIONS: None  CURRENT MEDICATIONS:  Outpatient Encounter Medications as of 02/26/2021  Medication Sig   acetaminophen (TYLENOL) 500 MG tablet Take 250-500 mg by mouth every 6 (six) hours as needed for headache (pain).   atenolol (TENORMIN) 25 MG tablet Take 1 tablet (25 mg total) by mouth daily.   Calcium Carbonate-Vitamin D (CALTRATE 600+D PO) Take 1 tablet by mouth daily.    cholecalciferol (VITAMIN D) 1000 units tablet Take 1,000 Units by mouth daily.   ezetimibe (ZETIA) 10 MG tablet Take 1 tablet (10 mg total) by mouth daily.   hydrocortisone (ANUSOL-HC) 25 MG suppository Place 25 mg rectally as needed for hemorrhoids or anal itching.   Lactase (LACTAID PO) Take 1 tablet by mouth 2 (two) times daily as needed (stomach upset/ gas).    Multiple Vitamin (MULTIVITAMIN WITH MINERALS) TABS tablet Take 1 tablet by mouth daily.   telmisartan (MICARDIS) 80 MG tablet Take 1 tablet (80 mg total) by mouth daily.   donepezil (ARICEPT) 5 MG tablet Take 1 tablet (5 mg total) by mouth at bedtime. (Patient not taking: Reported on 02/26/2021)   No facility-administered encounter medications on file as of 02/26/2021.     Objective:     PHYSICAL EXAMINATION:    VITALS:   Vitals:   02/26/21 1442  BP: 129/81  Pulse: 69  Resp: 18  SpO2: 98%  Weight: 153 lb (69.4 kg)  Height: 5\' 3"  (1.6 m)    GEN:  The patient appears stated age and is in NAD. HEENT:  Normocephalic, atraumatic.   Neurological examination:  Orientation: The patient is alert. Oriented to person, place and date  Cranial nerves: There is good facial symmetry.The speech is fluent and clear  Hearing is intact to conversational tone. Sensation: Sensation is intact to light touch throughout Motor: Strength is at least antigravity x4.  Movement examination: Tone: There is normal tone in the UE/LE Abnormal movements:  no tremor.  No myoclonus.  No asterixis.   Coordination:  There  is no decremation with RAM's. Gait and Station: The patient has no difficulty arising out of a deep-seated chair without the use of the hands. The patient's stride length is good.    MMSE - Mini Mental State Exam 08/26/2020  Orientation to time 5  Orientation to Place 5  Registration 3  Attention/ Calculation 0  Recall 3  Language- name 2 objects 2  Language- repeat 1  Language- follow 3 step command 3  Language- read & follow direction 1  Write a sentence 1  Copy design 1  Total score 25       Total time spent on today's visit was 30 minutes, including both face-to-face time and nonface-to-face time.  Time included that spent on review of records (prior notes available to me/labs/imaging if pertinent), discussing treatment and goals, answering patient's questions and coordinating care.  Cc:  Binnie Rail, MD Sharene Butters, PA-C

## 2021-03-05 ENCOUNTER — Encounter: Payer: Self-pay | Admitting: Internal Medicine

## 2021-03-05 NOTE — Progress Notes (Signed)
Outside notes received. Information abstracted. Notes sent to scan.  

## 2021-03-06 ENCOUNTER — Telehealth: Payer: Self-pay | Admitting: Internal Medicine

## 2021-03-06 NOTE — Telephone Encounter (Signed)
Was speaking with patient and got disconnected.  She has been advised to take Benadryl and follow up with [place] on Monday if it does not help with her itching.  She did not want to take Claritin since she takes Allegra daily.

## 2021-03-06 NOTE — Telephone Encounter (Signed)
Would recommend claritin daily and can take benadryl as needed

## 2021-03-06 NOTE — Telephone Encounter (Signed)
Pt. Is requesting that provider assistant call her for advise. She believes she is allergic to one of her medications, but is not for sure which medication it is. States that she has tried Calamine lotion, which relieved itch, but continues to have hive breakout under neck.      Callback #- [id number] or [phone]

## 2021-03-08 NOTE — Progress Notes (Signed)
Subjective:    Patient ID: Karen Evans, female    DOB: 1950-07-08, 70 y.o.   MRN: [id number]  This visit occurred during the SARS-CoV-2 public health emergency.  Safety protocols were in place, including screening questions prior to the visit, additional usage of staff PPE, and extensive cleaning of exam room while observing appropriate contact time as indicated for disinfecting solutions.    HPI The patient is here for an acute visit.    ? Medication reaction to Zetia- 3 days ago she developed hives on her neck only.  She feels this is a reaction to Zetia.  She has taken it for 3 months without any issues.  It is when she started a new prescription that she developed hives that are on her neck.  She states the pill looked the same as the one she had taken for 3 months.  She has been applying Benadryl cream on her neck to help with the itch.  The hives are better, but she still has some residual hives.  She denies any new medications or any other possible causes    Medications and allergies reviewed with patient and updated if appropriate.  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     atenolol (TENORMIN) 25 MG tablet Take 1 tablet (25 mg total) by mouth daily. 90 tablet 1   Calcium Carbonate-Vitamin D (CALTRATE 600+D PO) Take 1 tablet by mouth daily.      cholecalciferol (VITAMIN D) 1000 units tablet Take 1,000 Units by mouth daily.     hydrocortisone (ANUSOL-HC) 25 MG suppository Place 25 mg rectally as needed for hemorrhoids or anal itching.     Lactase (LACTAID PO) Take 1 tablet by mouth 2 (two) times daily as needed (stomach upset/ gas).      Multiple Vitamin (MULTIVITAMIN WITH MINERALS) TABS tablet Take 1 tablet by mouth daily.     telmisartan (MICARDIS) 80 MG tablet Take 1 tablet (80 mg total) by mouth daily. 90 tablet 2   donepezil (ARICEPT) 5 MG tablet Take 1 tablet (5 mg total) by mouth at bedtime. 30 tablet 11   No current facility-administered medications on file prior to visit.    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   Heart murmur    Hiatal  hernia 2002   EGD   Hypertension    Internal hemorrhoids    Migraines    when takes vitamins    Rectal polyp 01/24/2012   TUBULAR ADENOMA   Reflux esophagitis 2007   EGD   Stricture and stenosis of esophagus 2007   EGD   Tubular adenoma of colon    Ulcer    Vitamin D deficiency     Past Surgical History:  Procedure Laterality Date   COLONOSCOPY     polyps;granular ileitis 2006;2007 normal, Dr Sharlett Iles   HEMORRHOID SURGERY     OPERATIVE HYSTEROSCOPY  08-05-04   Dr Garwin Brothers   OVARIAN CYST REMOVAL     POLYPECTOMY     UPPER GASTROINTESTINAL ENDOSCOPY      Social History   Socioeconomic History   Marital status: Single    Spouse name: Not on file   Number of children: 0    Years of education: Not on file   Highest education level: Not on file  Occupational History   Occupation: TESTER    Employer: [employer]  Tobacco Use   Smoking status: Never   Smokeless tobacco: Never  Vaping Use   Vaping Use: Never used  Substance and Sexual Activity   Alcohol use: No   Drug use: No   Sexual activity: Not on file  Other Topics Concern   Not on file  Social History Narrative   Right handed      Some College   Social Determinants of Health   Financial Resource Strain: Not on file  Food Insecurity: Not on file  Transportation Needs: Not on file  Physical Activity: Not on file  Stress: Not on file  Social Connections: Not on file    Family History  Problem Relation Age of Onset   Lymphoma Father    Diabetes Mother    Hypertension Mother    Heart disease Mother    Penile cancer Paternal Grandfather    Diabetes Brother        CAD; Dialysis   Hypertension Brother    Heart attack Other        1/2 maternal aunts X 2   Rectal cancer Paternal Grandmother    Colon cancer Maternal Grandmother    Cancer Other    COPD Neg Hx    Esophageal cancer Neg Hx    Stomach cancer Neg Hx    Stroke Neg Hx     Review of Systems  Constitutional:  Negative for chills and fever.  HENT:  Negative for facial swelling, sore throat and trouble swallowing.   Respiratory:  Negative for cough, shortness of breath and wheezing.   Cardiovascular:  Negative for chest pain.  Skin:  Positive for rash.  Neurological:  Negative for headaches.      Objective:   Vitals:   03/09/21 1318  BP: 120/78  Pulse: 63  Temp: 98.3 F (36.8 C)  SpO2: 98%   BP Readings from Last 3 Encounters:  03/09/21 120/78  02/26/21 129/81  02/09/21 108/70   Wt Readings from Last 3 Encounters:  03/09/21 152 lb (68.9 kg)  02/26/21 153 lb (69.4 kg)  02/09/21 151 lb 3.2 oz (68.6 kg)   Body mass index is 26.93 kg/m.   Physical Exam Constitutional:      General: She is not in  acute distress.    Appearance: Normal appearance. She is not ill-appearing.  HENT:     Head: Normocephalic and atraumatic.     Mouth/Throat:  Mouth: Mucous membranes are moist.     Pharynx: No posterior oropharyngeal erythema.  Eyes:     Conjunctiva/sclera: Conjunctivae normal.  Skin:    General: Skin is warm and dry.     Findings: No erythema, lesion or rash (No obvious rash or hives on neck, but skin does have mild bumps and some dryness).  Neurological:     Mental Status: She is alert.       Lab Results  Component Value Date   CHOL 232 (H) 02/09/2021   HDL 62.70 02/09/2021   LDLCALC 139 (H) 09/19/2020   LDLDIRECT 139.0 02/09/2021   TRIG 202.0 (H) 02/09/2021   CHOLHDL 4 02/09/2021       Assessment & Plan:    See Problem List for Assessment and Plan of chronic medical problems.

## 2021-03-09 ENCOUNTER — Encounter: Payer: Self-pay | Admitting: Internal Medicine

## 2021-03-09 ENCOUNTER — Ambulatory Visit (INDEPENDENT_AMBULATORY_CARE_PROVIDER_SITE_OTHER): Payer: Medicare Other | Admitting: Internal Medicine

## 2021-03-09 ENCOUNTER — Other Ambulatory Visit: Payer: Self-pay

## 2021-03-09 DIAGNOSIS — L509 Urticaria, unspecified: Secondary | ICD-10-CM | POA: Diagnosis not present

## 2021-03-09 DIAGNOSIS — E7849 Other hyperlipidemia: Secondary | ICD-10-CM | POA: Diagnosis not present

## 2021-03-09 NOTE — Patient Instructions (Addendum)
Medications changes include :   stop the zetia ( ezetimibe)     A referral was ordered for cardiology - cholesterol clinic       Someone from their office will call you to schedule an appointment.    Work lowering your cholesterol.       Preventing High Cholesterol Cholesterol is a white, waxy substance similar to fat that the human body needs to help build cells. The liver makes all the cholesterol that a person's body needs. Having high cholesterol (hypercholesterolemia) increases your risk for heart disease and stroke. Extra or excess cholesterol comes from the food that you eat. High cholesterol can often be prevented with diet and lifestyle changes. If you already have high cholesterol, you can control it with diet, lifestyle changes, and medicines. How can high cholesterol affect me? If you have high cholesterol, fatty deposits (plaques) may build up on the walls of your blood vessels. The blood vessels that carry blood away from your heart are called arteries. Plaques make the arteries narrower and stiffer. This in turn can: Restrict or block blood flow and cause blood clots to form. Increase your risk for heart attack and stroke. What can increase my risk for high cholesterol? This condition is more likely to develop in people who: Eat foods that are high in saturated fat or cholesterol. Saturated fat is mostly found in foods that come from animal sources. Are overweight. Are not getting enough exercise. Use products that contain nicotine or tobacco, such as cigarettes, e-cigarettes, and chewing tobacco. Have a family history of high cholesterol (familial hypercholesterolemia). What actions can I take to prevent this? Nutrition  Eat less saturated fat. Avoid trans fats (partially hydrogenated oils). These are often found in margarine and in some baked goods, fried foods, and snacks bought in packages. Avoid precooked or cured meat, such as bacon, sausages, or meat  loaves. Avoid foods and drinks that have added sugars. Eat more fruits, vegetables, and whole grains. Choose healthy sources of protein, such as fish, poultry, lean cuts of red meat, beans, peas, lentils, and nuts. Choose healthy sources of fat, such as: Nuts. Vegetable oils, especially olive oil. Fish that have healthy fats, such as omega-3 fatty acids. These fish include mackerel or salmon. Lifestyle Lose weight if you are overweight. Maintaining a healthy body mass index (BMI) can help prevent or control high cholesterol. It can also lower your risk for diabetes and high blood pressure. Ask your health care provider to help you with a diet and exercise plan to lose weight safely. Do not use any products that contain nicotine or tobacco. These products include cigarettes, chewing tobacco, and vaping devices, such as e-cigarettes. If you need help quitting, ask your health care provider. Alcohol use Do not drink alcohol if: Your health care provider tells you not to drink. You are pregnant, may be pregnant, or are planning to become pregnant. If you drink alcohol: Limit how much you have to: 0-1 drink a day for women. 0-2 drinks a day for men. Know how much alcohol is in your drink. In the [place] one drink equals one 12 oz bottle of beer (355 mL), one 5 oz glass of wine (148 mL), or one 1 oz glass of hard liquor (44 mL). Activity  Get enough exercise. Do exercises as told by your health care provider. Each week, do at least 150 minutes of exercise that takes a medium level of effort (moderate-intensity exercise). This kind of exercise: Makes  your heart beat faster while allowing you to still be able to talk. Can be done in short sessions several times a day or longer sessions a few times a week. For example, on 5 days each week, you could walk fast or ride your bike 3 times a day for 10 minutes each time. Medicines Your health care provider may recommend medicines to help lower  cholesterol. This may be a medicine to lower the amount of cholesterol that your liver makes. You may need medicine if: Diet and lifestyle changes have not lowered your cholesterol enough. You have high cholesterol and other risk factors for heart disease or stroke. Take over-the-counter and prescription medicines only as told by your health care provider. General information Manage your risk factors for high cholesterol. Talk with your health care provider about all your risk factors and how to lower your risk. Manage other conditions that you have, such as diabetes or high blood pressure (hypertension). Have blood tests to check your cholesterol levels at regular points in time as told by your health care provider. Keep all follow-up visits. This is important. Where to find more information American Heart Association: www.heart.org National Heart, Lung, and Blood Institute: https://wilson-eaton.com/ Summary High cholesterol increases your risk for heart disease and stroke. By keeping your cholesterol level low, you can reduce your risk for these conditions. High cholesterol can often be prevented with diet and lifestyle changes. Work with your health care provider to manage your risk factors, and have your blood tested regularly. This information is not intended to replace advice given to you by your health care provider. Make sure you discuss any questions you have with your health care provider. Document Revised: 07/07/2020 Document Reviewed: 07/07/2020 Elsevier Patient Education  2022 [employer].

## 2021-03-09 NOTE — Assessment & Plan Note (Signed)
Acute Started 3 days ago-no obvious hives on exam, but she states her skin on her neck is still not normal Can continue Benadryl cream as needed She feels this was a reaction to Zetia, but I am not convinced it was, but will discontinue the medication Call if symptoms do not completely resolve

## 2021-03-09 NOTE — Assessment & Plan Note (Addendum)
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

## 2021-03-10 ENCOUNTER — Telehealth: Payer: Self-pay | Admitting: Internal Medicine

## 2021-03-10 NOTE — Telephone Encounter (Signed)
Patient requesting call back to discuss medication option

## 2021-03-11 NOTE — Telephone Encounter (Signed)
Pt calling back to see what other cholesterol medicine she can take.  States she doesn't go back to see the cardiologist until January.  She also wants to know what her cholesterol level is  Please call the patient at [phone]

## 2021-03-11 NOTE — Telephone Encounter (Signed)
She did not tolerate Zetia or 3 different statins.  I would like her to see cardiology to see if she should consider an injectable cholesterol medication

## 2021-03-12 MED ORDER — ROSUVASTATIN CALCIUM 5 MG PO TABS: 5.0000 mg | ORAL_TABLET | ORAL | 3 refills | Status: DC

## 2021-03-12 NOTE — Telephone Encounter (Signed)
We can retry one - crestor 5 mg three times a week only - rx sent to [facility]

## 2021-03-12 NOTE — Telephone Encounter (Signed)
Patient says she contacted cardio office & they are not able to get her in until Jan..  Says she believes she was not able to tolerate those previous medications bc of some injections that she was taking at the time but she believes she may do better w/ them now  Please call her back & let her know what Dr. Quay Burow recommends

## 2021-03-12 NOTE — Telephone Encounter (Signed)
Spoke with patient.

## 2021-03-12 NOTE — Telephone Encounter (Signed)
Message left for patient with Dr. Quay Burow recommendations.  If she calls back and did not receive message please give her Dr. Quay Burow response.

## 2021-03-12 NOTE — Addendum Note (Signed)
Addended by: Binnie Rail on: 03/12/2021 04:05 PM   Modules accepted: Orders

## 2021-03-19 ENCOUNTER — Telehealth: Payer: Self-pay | Admitting: Internal Medicine

## 2021-03-19 NOTE — Telephone Encounter (Signed)
Spoke with patient today. 

## 2021-03-19 NOTE — Telephone Encounter (Signed)
Patient states the medication rosuvastatin (CRESTOR) 5 MG tablet makes her "feel funny"  Patient is requesting a call back to discuss medication and the affects

## 2021-03-19 NOTE — Telephone Encounter (Signed)
Spoke with patient today.  She states medication is making her jittery when she takes it and doesn't feel like herself.  She states she has been taking meds 3 days in a row.

## 2021-04-14 DIAGNOSIS — H25013 Cortical age-related cataract, bilateral: Secondary | ICD-10-CM | POA: Diagnosis not present

## 2021-04-14 DIAGNOSIS — H524 Presbyopia: Secondary | ICD-10-CM | POA: Diagnosis not present

## 2021-04-14 DIAGNOSIS — H5213 Myopia, bilateral: Secondary | ICD-10-CM | POA: Diagnosis not present

## 2021-04-14 DIAGNOSIS — H2513 Age-related nuclear cataract, bilateral: Secondary | ICD-10-CM | POA: Diagnosis not present

## 2021-04-28 DIAGNOSIS — D225 Melanocytic nevi of trunk: Secondary | ICD-10-CM | POA: Diagnosis not present

## 2021-04-28 DIAGNOSIS — L821 Other seborrheic keratosis: Secondary | ICD-10-CM | POA: Diagnosis not present

## 2021-04-28 DIAGNOSIS — L668 Other cicatricial alopecia: Secondary | ICD-10-CM | POA: Diagnosis not present

## 2021-04-28 DIAGNOSIS — L538 Other specified erythematous conditions: Secondary | ICD-10-CM | POA: Diagnosis not present

## 2021-04-28 DIAGNOSIS — L820 Inflamed seborrheic keratosis: Secondary | ICD-10-CM | POA: Diagnosis not present

## 2021-05-15 ENCOUNTER — Encounter (HOSPITAL_BASED_OUTPATIENT_CLINIC_OR_DEPARTMENT_OTHER): Payer: Self-pay

## 2021-05-28 ENCOUNTER — Telehealth: Payer: Self-pay | Admitting: Internal Medicine

## 2021-05-28 NOTE — Telephone Encounter (Signed)
Left message for patient to call back to schedule Medicare Annual Wellness Visit   Last AWV  02/08/20  Please schedule at anytime with [facility] if patient calls the office back.    40 Minutes appointment   Any questions, please call me at [phone]

## 2021-05-29 ENCOUNTER — Telehealth: Payer: Self-pay | Admitting: Internal Medicine

## 2021-05-29 NOTE — Telephone Encounter (Signed)
Returning call to patient, unable to leave a message for patient to call back to schedule Medicare Annual Wellness Visit   Last AWV  02/08/20  Please schedule at anytime with [facility] if patient calls the office back.    40 Minutes appointment   Any questions, please call me at [phone]

## 2021-06-02 ENCOUNTER — Ambulatory Visit (HOSPITAL_BASED_OUTPATIENT_CLINIC_OR_DEPARTMENT_OTHER): Payer: Medicare Other | Admitting: Internal Medicine

## 2021-06-02 ENCOUNTER — Encounter (HOSPITAL_BASED_OUTPATIENT_CLINIC_OR_DEPARTMENT_OTHER): Payer: Self-pay | Admitting: Internal Medicine

## 2021-06-02 ENCOUNTER — Other Ambulatory Visit: Payer: Self-pay

## 2021-06-02 VITALS — BP 135/72 | HR 63 | Ht 63.0 in | Wt 151.8 lb

## 2021-06-02 DIAGNOSIS — I10 Essential (primary) hypertension: Secondary | ICD-10-CM

## 2021-06-02 DIAGNOSIS — Z888 Allergy status to other drugs, medicaments and biological substances status: Secondary | ICD-10-CM

## 2021-06-02 DIAGNOSIS — E7849 Other hyperlipidemia: Secondary | ICD-10-CM | POA: Diagnosis not present

## 2021-06-02 DIAGNOSIS — R7303 Prediabetes: Secondary | ICD-10-CM | POA: Diagnosis not present

## 2021-06-02 NOTE — Patient Instructions (Signed)
Medication Instructions:  Your physician recommends that you continue on your current medications as directed. Please refer to the Current Medication list given to you today.  *If you need a refill on your cardiac medications before your next appointment, please call your pharmacy*   Lab Work: FASTING lab work to check cholesterol today   FASTING lab work again before your next appointment   If you have labs (blood work) drawn today and your tests are completely normal, you will receive your results only by: MyChart Message (if you have MyChart) OR A paper copy in the mail If you have any lab test that is abnormal or we need to change your treatment, we will call you to review the results.  Follow-Up: At [facility], you and your health needs are our priority.  As part of our continuing mission to provide you with exceptional heart care, we have created designated Provider Care Teams.  These Care Teams include your primary Cardiologist (physician) and Advanced Practice Providers (APPs -  Physician Assistants and Nurse Practitioners) who all work together to provide you with the care you need, when you need it.  We recommend signing up for the patient portal called "MyChart".  Sign up information is provided on this After Visit Summary.  MyChart is used to connect with patients for Virtual Visits (Telemedicine).  Patients are able to view lab/test results, encounter notes, upcoming appointments, etc.  Non-urgent messages can be sent to your provider as well.   To learn more about what you can do with MyChart, go to [email or web address].    Your next appointment:   3-4 months with Dr. Debara Pickett -- lipid clinic

## 2021-06-02 NOTE — Progress Notes (Signed)
LIPID CLINIC CONSULT NOTE  Chief Complaint:  Manage dyslipidemia  Primary Care Physician: Binnie Rail, MD  Primary Cardiologist:  None  HPI:  Karen Evans is a 71 y.o. female who is being seen today for the evaluation of dyslipidemia at the request of Burns, Claudina Lick, MD. This is a very pleasant 71 year old female kindly referred for evaluation management of dyslipidemia.  She has had a longstanding history of elevated cholesterol but unfortunately has been intolerant to statins having had allergic symptoms, primarily hives.  Despite having hives with both atorvastatin, rosuvastatin and pravastatin, she has been able to tolerate low-dose rosuvastatin 5 mg 3 times a week.  She also had an tolerance to ezetimibe which cause drowsiness and probably a rash.  In general her side effect with medicine she says is rash.  She does carry diagnosis of a vascular dementia which she downplayed.  She also has prediabetes and essential hypertension.  No family history of early onset heart disease and her mother still alive at 67.  More recently her lipids have come down with total cholesterol 232, HDL 62, triglycerides 202 and LDL 139.  PMHx:  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   Heart murmur    Hiatal hernia 2002   EGD   Hypertension    Internal hemorrhoids    Migraines    when takes vitamins    Rectal polyp 01/24/2012   TUBULAR ADENOMA   Reflux esophagitis 2007   EGD   Stricture and stenosis of esophagus 2007   EGD   Tubular adenoma of colon    Ulcer    Vitamin D deficiency     Past Surgical History:  Procedure Laterality Date   COLONOSCOPY     polyps;granular ileitis 2006;2007 normal, Dr Sharlett Iles   HEMORRHOID SURGERY      OPERATIVE HYSTEROSCOPY  08-05-04   Dr Garwin Brothers   OVARIAN CYST REMOVAL     POLYPECTOMY     UPPER GASTROINTESTINAL ENDOSCOPY      FAMHx:  Family History  Problem Relation Age of Onset   Lymphoma Father    Diabetes Mother    Hypertension Mother    Heart disease Mother    Penile cancer Paternal Grandfather    Diabetes Brother        CAD; Dialysis   Hypertension Brother    Heart attack Other        1/2 maternal aunts X 2   Rectal cancer Paternal Grandmother    Colon cancer Maternal Grandmother    Cancer Other    COPD Neg Hx    Esophageal cancer Neg Hx    Stomach cancer Neg Hx    Stroke Neg Hx     SOCHx:   reports that she has never smoked. She has never used smokeless tobacco. She reports that she does not drink alcohol and does not use drugs.  ALLERGIES:  Allergies  Allergen Reactions   Cefprozil Hives   Erythromycin Hives   Esomeprazole Magnesium Hives   Penicillins Hives and Rash    Has patient had a PCN reaction causing immediate rash, facial/tongue/throat  swelling, SOB or lightheadedness with hypotension: Yes Has patient had a PCN reaction causing severe rash involving mucus membranes or skin necrosis: No Has patient had a PCN reaction that required hospitalization: No Has patient had a PCN reaction occurring within the last 10 years: No If all of the above answers are "NO", then may proceed with Cephalosporin use.   Sulfonamide Derivatives     hives   Amlodipine Other (See Comments)    gerd   Crestor [Rosuvastatin Calcium]     itching   Crestor [Rosuvastatin]     Lightheadedness, itchy, prickly all over   Erythromycin Base    Hydrochlorothiazide Other (See Comments)    lightheadedness   Pravastatin     Dizzy, muscle soreness   Sulfa Antibiotics    Zetia [Ezetimibe]     Caused drowsiness, rash   Atorvastatin Other (See Comments)    Eyes itch   Doxycycline Nausea And Vomiting    ROS: Pertinent items noted in HPI and remainder of comprehensive ROS  otherwise negative.  HOME MEDS: Current Outpatient Medications on File Prior to Visit  Medication Sig Dispense Refill   acetaminophen (TYLENOL) 500 MG tablet Take 250-500 mg by mouth every 6 (six) hours as needed for headache (pain).     atenolol (TENORMIN) 25 MG tablet Take 1 tablet (25 mg total) by mouth daily. 90 tablet 1   Calcium Carbonate-Vitamin D (CALTRATE 600+D PO) Take 1 tablet by mouth daily.      cholecalciferol (VITAMIN D) 1000 units tablet Take 1,000 Units by mouth daily.     donepezil (ARICEPT) 5 MG tablet Take 1 tablet (5 mg total) by mouth at bedtime. 30 tablet 11   hydrocortisone (ANUSOL-HC) 25 MG suppository Place 25 mg rectally as needed for hemorrhoids or anal itching.     Lactase (LACTAID PO) Take 1 tablet by mouth 2 (two) times daily as needed (stomach upset/ gas).      Multiple Vitamin (MULTIVITAMIN WITH MINERALS) TABS tablet Take 1 tablet by mouth daily.     rosuvastatin (CRESTOR) 5 MG tablet Take 1 tablet (5 mg total) by mouth 3 (three) times a week. 13 tablet 3   telmisartan (MICARDIS) 80 MG tablet Take 1 tablet (80 mg total) by mouth daily. 90 tablet 2   No current facility-administered medications on file prior to visit.    LABS/IMAGING: No results found for this or any previous visit (from the past 48 hour(s)). No results found.  LIPID PANEL:    Component Value Date/Time   CHOL 232 (H) 02/09/2021 1456   TRIG 202.0 (H) 02/09/2021 1456   HDL 62.70 02/09/2021 1456   CHOLHDL 4 02/09/2021 1456   VLDL 40.4 (H) 02/09/2021 1456   LDLCALC 139 (H) 09/19/2020 1435   LDLCALC 165 (H) 02/08/2020 1343   LDLDIRECT 139.0 02/09/2021 1456    WEIGHTS: Wt Readings from Last 3 Encounters:  06/02/21 151 lb 12.8 oz (68.9 kg)  03/09/21 152 lb (68.9 kg)  02/26/21 153 lb (69.4 kg)    VITALS: BP 135/72    Pulse 63    Ht 5\' 3"  (1.6 m)    Wt 151 lb 12.8 oz (68.9 kg)    SpO2 98%    BMI 26.89 kg/m   EXAM: General appearance: alert and no distress Neck: no carotid bruit,  no JVD, and thyroid not enlarged, symmetric, no tenderness/mass/nodules Lungs: clear to auscultation bilaterally Heart: regular rate and rhythm, S1, S2 normal, no murmur, click, rub or gallop Abdomen: soft, non-tender; bowel sounds  normal; no masses,  no organomegaly Extremities: extremities normal, atraumatic, no cyanosis or edema Pulses: 2+ and symmetric Skin: Skin color, texture, turgor normal. No rashes or lesions Neurologic: Grossly normal Psych: Pleasant  EKG: Deferred  ASSESSMENT: Mixed dyslipidemia Statin intolerant-hives/allergy Ezetimibe intolerant Hypertension Prediabetes  PLAN: 1.   Karen Evans has a mixed dyslipidemia with elevated LDL cholesterol.  Her last LDL was 139 and she has been on low-dose rosuvastatin 3 times a week.  This is the most medicine she can take.  She has had allergy to statins in the past but for some reason can tolerate the lower dose.  I would like to repeat a lipid since her last test was in September.  Based on that we could consider other options.  She might be a candidate for Nexletol in addition to her low-dose statin to reach target LDL to less than 100.  I will contact her with the repeat numbers.  Thanks again for the kind referral.  Plan follow-up with me in about 3 to 4 months if we start additional therapy with repeat lipids.  Pixie Casino, MD, [facility] [place], [place] Director of the Advanced Lipid Disorders &  Cardiovascular Risk Reduction Clinic Diplomate of the American Board of Clinical Lipidology Attending Cardiologist  Direct Dial: [phone]   Fax: [phone]  Website:  [email or web address] [email or web address] Haisley Arens 06/02/2021, 12:54 PM

## 2021-06-03 ENCOUNTER — Telehealth: Payer: Self-pay | Admitting: Internal Medicine

## 2021-06-03 DIAGNOSIS — E7849 Other hyperlipidemia: Secondary | ICD-10-CM

## 2021-06-03 LAB — LIPID PANEL
Chol/HDL Ratio: 2.4 ratio (ref 0.0–4.4)
Cholesterol, Total: 187 mg/dL (ref 100–199)
HDL: 77 mg/dL (ref >39)
LDL Chol Calc (NIH): 91 mg/dL (ref 0–99)
Triglycerides: 110 mg/dL (ref 0–149)
VLDL Cholesterol Cal: 19 mg/dL (ref 5–40)

## 2021-06-03 NOTE — Telephone Encounter (Signed)
Returned call to pt notified [place] has not reviewed yet. We will call her with results

## 2021-06-03 NOTE — Telephone Encounter (Signed)
Patient is calling to receive her lab results. Please call back

## 2021-06-08 ENCOUNTER — Encounter: Payer: Self-pay | Admitting: Internal Medicine

## 2021-06-08 NOTE — Telephone Encounter (Signed)
Lipid profile has improved - trigs normal and LDL 91. At this point she is at goal, therefore, no medication changes recommended.   Dr Luciano Cutter with patient about lab results. Advised that since we are not changing medications, she can follow up as needed. She prefers to see Dr. Debara Pickett again - advised we can do a 6 month visit and check labs prior. Explained she will get a notice in the mail when to call/schedule.

## 2021-06-26 ENCOUNTER — Other Ambulatory Visit: Payer: Self-pay | Admitting: Internal Medicine

## 2021-08-06 ENCOUNTER — Other Ambulatory Visit: Payer: Self-pay

## 2021-08-06 MED ORDER — ROSUVASTATIN CALCIUM 5 MG PO TABS: ORAL_TABLET | ORAL | 2 refills | Status: DC

## 2021-08-06 MED ORDER — ATENOLOL 25 MG PO TABS: 25.0000 mg | ORAL_TABLET | Freq: Every day | ORAL | 1 refills | Status: DC

## 2021-08-10 ENCOUNTER — Encounter: Payer: Self-pay | Admitting: Internal Medicine

## 2021-08-10 NOTE — Progress Notes (Signed)
? ? ? ? ?Subjective:  ? ? Patient ID: Karen Evans, female    DOB: [birth year], 71 y.o.   MRN: [id number] ? ?This visit occurred during the SARS-CoV-2 public health emergency.  Safety protocols were in place, including screening questions prior to the visit, additional usage of staff PPE, and extensive cleaning of exam room while observing appropriate contact time as indicated for disinfecting solutions.   ? ? ?HPI ?Karen Evans is here for follow up of her chronic medical problems, including htn, hld, prediabetes, mild vascular neurocognitive d/o ? ?She is taking all of her medications as prescribed.  She is exercising in the morning.  Rides bike on Saturday.   ? ?Medications and allergies reviewed with patient and updated if appropriate. ? ?Current Outpatient Medications on File Prior to Visit  ?Medication Sig Dispense Refill  ? acetaminophen (TYLENOL) 500 MG tablet Take 250-500 mg by mouth every 6 (six) hours as needed for headache (pain).    ? atenolol (TENORMIN) 25 MG tablet Take 1 tablet (25 mg total) by mouth daily. 90 tablet 1  ? Calcium Carbonate-Vitamin D (CALTRATE 600+D PO) Take 1 tablet by mouth daily.     ? cholecalciferol (VITAMIN D) 1000 units tablet Take 1,000 Units by mouth daily.    ? donepezil (ARICEPT) 5 MG tablet Take 1 tablet (5 mg total) by mouth at bedtime. 30 tablet 11  ? hydrocortisone (ANUSOL-HC) 25 MG suppository Place 25 mg rectally as needed for hemorrhoids or anal itching.    ? Lactase (LACTAID PO) Take 1 tablet by mouth 2 (two) times daily as needed (stomach upset/ gas).     ? Multiple Vitamin (MULTIVITAMIN WITH MINERALS) TABS tablet Take 1 tablet by mouth daily.    ? rosuvastatin (CRESTOR) 5 MG tablet TAKE 1 TABLET BY MOUTH 3 TIMES A WEEK 39 tablet 2  ? telmisartan (MICARDIS) 80 MG tablet Take 1 tablet (80 mg total) by mouth daily. 90 tablet 2  ? ?No current facility-administered medications on file prior to visit.  ? ? ? ?Review of Systems  ?Constitutional:  Negative for chills and fever.   ?Respiratory:  Positive for cough (occ). Negative for shortness of breath and wheezing.   ?Cardiovascular:  Negative for chest pain, palpitations and leg swelling.  ?Gastrointestinal:  Negative for abdominal pain, constipation and diarrhea.  ?Neurological:  Negative for light-headedness and headaches.  ? ?   ?Objective:  ? ?Vitals:  ? 08/11/21 1415  ?BP: 134/70  ?Pulse: 60  ?Temp: 98.6 ?F (37 ?C)  ?SpO2: 98%  ? ?BP Readings from Last 3 Encounters:  ?08/11/21 134/70  ?06/02/21 135/72  ?03/09/21 120/78  ? ?Wt Readings from Last 3 Encounters:  ?08/11/21 150 lb 9.6 oz (68.3 kg)  ?06/02/21 151 lb 12.8 oz (68.9 kg)  ?03/09/21 152 lb (68.9 kg)  ? ?Body mass index is 26.68 kg/m?. ? ?  ?Physical Exam ?Constitutional:   ?   General: She is not in acute distress. ?   Appearance: Normal appearance.  ?HENT:  ?   Head: Normocephalic and atraumatic.  ?Eyes:  ?   Conjunctiva/sclera: Conjunctivae normal.  ?Cardiovascular:  ?   Rate and Rhythm: Normal rate and regular rhythm.  ?   Heart sounds: Normal heart sounds. No murmur heard. ?Pulmonary:  ?   Effort: Pulmonary effort is normal. No respiratory distress.  ?   Breath sounds: Normal breath sounds. No wheezing.  ?Musculoskeletal:  ?   Cervical back: Neck supple.  ?   Right lower leg: No edema.  ?  Left lower leg: No edema.  ?Lymphadenopathy:  ?   Cervical: No cervical adenopathy.  ?Skin: ?   Findings: No rash.  ?Neurological:  ?   Mental Status: She is alert. Mental status is at baseline.  ?Psychiatric:     ?   Mood and Affect: Mood normal.     ?   Behavior: Behavior normal.  ? ?   ? ?Lab Results  ?Component Value Date  ? WBC 6.3 02/09/2021  ? HGB 12.5 02/09/2021  ? HCT 39.3 02/09/2021  ? PLT 238.0 02/09/2021  ? GLUCOSE 103 (H) 02/09/2021  ? CHOL 187 06/02/2021  ? TRIG 110 06/02/2021  ? HDL 77 06/02/2021  ? LDLDIRECT 139.0 02/09/2021  ? [place] 91 06/02/2021  ? ALT 15 02/09/2021  ? AST 18 02/09/2021  ? NA 139 02/09/2021  ? K 3.7 02/09/2021  ? CL 104 02/09/2021  ? CREATININE 0.73  02/09/2021  ? BUN 12 02/09/2021  ? CO2 29 02/09/2021  ? TSH 0.48 02/09/2021  ? HGBA1C 6.2 02/09/2021  ? ? ? ?Assessment & Plan:  ? ? ?See Problem List for Assessment and Plan of chronic medical problems.  ? ? ?

## 2021-08-10 NOTE — Patient Instructions (Addendum)
? ? ? ?  Blood work was ordered.   ? ? ?Medications changes include :   none ? ? ? ?A thyroid ultrasound was ordered.     Someone from that office will call you to schedule an appointment.  ? ? ?Return in about 6 months (around 02/11/2022) for follow up. ? ?

## 2021-08-11 ENCOUNTER — Ambulatory Visit (INDEPENDENT_AMBULATORY_CARE_PROVIDER_SITE_OTHER): Payer: Medicare Other | Admitting: Internal Medicine

## 2021-08-11 VITALS — BP 134/70 | HR 60 | Temp 98.6°F | Ht 63.0 in | Wt 150.6 lb

## 2021-08-11 DIAGNOSIS — I10 Essential (primary) hypertension: Secondary | ICD-10-CM | POA: Diagnosis not present

## 2021-08-11 DIAGNOSIS — M85852 Other specified disorders of bone density and structure, left thigh: Secondary | ICD-10-CM | POA: Diagnosis not present

## 2021-08-11 DIAGNOSIS — F01A0 Vascular dementia, mild, without behavioral disturbance, psychotic disturbance, mood disturbance, and anxiety: Secondary | ICD-10-CM | POA: Diagnosis not present

## 2021-08-11 DIAGNOSIS — E049 Nontoxic goiter, unspecified: Secondary | ICD-10-CM

## 2021-08-11 DIAGNOSIS — R7303 Prediabetes: Secondary | ICD-10-CM

## 2021-08-11 DIAGNOSIS — E7849 Other hyperlipidemia: Secondary | ICD-10-CM

## 2021-08-11 LAB — HEMOGLOBIN A1C: Hgb A1c MFr Bld: 6.2 % (ref 4.6–6.5)

## 2021-08-11 LAB — COMPREHENSIVE METABOLIC PANEL
ALT: 15 U/L (ref 0–35)
AST: 17 U/L (ref 0–37)
Albumin: 4 g/dL (ref 3.5–5.2)
Alkaline Phosphatase: 75 U/L (ref 39–117)
BUN: 13 mg/dL (ref 6–23)
CO2: 31 mEq/L (ref 19–32)
Calcium: 10.2 mg/dL (ref 8.4–10.5)
Chloride: 103 mEq/L (ref 96–112)
Creatinine, Ser: 0.77 mg/dL (ref 0.40–1.20)
GFR: 79.1 mL/min (ref >60.00)
Glucose, Bld: 94 mg/dL (ref 70–99)
Potassium: 3.8 mEq/L (ref 3.5–5.1)
Sodium: 141 mEq/L (ref 135–145)
Total Bilirubin: 0.4 mg/dL (ref 0.2–1.2)
Total Protein: 7.1 g/dL (ref 6.0–8.3)

## 2021-08-11 LAB — TSH: TSH: 0.39 u[IU]/mL (ref 0.35–5.50)

## 2021-08-11 NOTE — Assessment & Plan Note (Signed)
Chronic ?Taking MVI ?Continue exercise ?dexa due - ordered ?

## 2021-08-11 NOTE — Assessment & Plan Note (Addendum)
Chronic ?Following with Dr. Delice Lesch has been prescribed Aricept 5 mg nightly - not taking - gave her vivid dreams ?Encouraged regular exercise, healthy diet, brain exercises ?

## 2021-08-11 NOTE — Assessment & Plan Note (Signed)
Chronic Blood pressure well controlled CMP Continue atenolol 25 mg daily, telmisartan 80 mg daily 

## 2021-08-11 NOTE — Addendum Note (Signed)
Addended by: Binnie Rail on: 08/11/2021 02:40 PM ? ? Modules accepted: Orders ? ?

## 2021-08-11 NOTE — Assessment & Plan Note (Addendum)
Chronic Regular exercise and healthy diet encouraged Check lipid panel  Continue Crestor 5 mg 3 times weekly  

## 2021-08-11 NOTE — Assessment & Plan Note (Signed)
Chronic Check a1c Low sugar / carb diet Stressed regular exercise  

## 2021-08-11 NOTE — Assessment & Plan Note (Signed)
Check US 

## 2021-08-13 ENCOUNTER — Telehealth: Payer: Self-pay

## 2021-08-13 NOTE — Telephone Encounter (Signed)
Pt called back for results. I advised pt of Dr. Quay Burow recommendations Thyroid function, kidney function and liver tests are normal.  Sugar stable in prediabetic range. ? ?Pt understood and had no further questions ? ?FYI ?

## 2021-08-24 ENCOUNTER — Other Ambulatory Visit: Payer: Self-pay | Admitting: Internal Medicine

## 2021-08-31 ENCOUNTER — Ambulatory Visit: Payer: Medicare Other | Admitting: Physician Assistant

## 2021-08-31 ENCOUNTER — Encounter: Payer: Self-pay | Admitting: Physician Assistant

## 2021-08-31 VITALS — BP 144/64 | HR 88 | Resp 18 | Ht 63.0 in | Wt 151.0 lb

## 2021-08-31 DIAGNOSIS — I999 Unspecified disorder of circulatory system: Secondary | ICD-10-CM

## 2021-08-31 DIAGNOSIS — F0670 Mild neurocognitive disorder due to known physiological condition without behavioral disturbance: Secondary | ICD-10-CM

## 2021-08-31 NOTE — Progress Notes (Signed)
? ?Assessment/Plan:  ? ?Mild vascular neurocognitive disorder ? ?She is stable from the cognitive standpoint. She did not tolerate low dose of Aricept due to very vivid dreams. She declines any other antidementia at this time, unless memory worsens. MMSE today is 30/30 with delayed recall 3/3.  ? ?Discussed safety both in and out of the home. Discussed the importance of regular daily schedule with inclusion of crossword puzzles to maintain brain function.  ?Continue to monitor mood with PCP.  ?Continue to stay active exercising  at least 30 minutes at least 3 times a week.  ?Naps should be scheduled and should be no longer than 60 minutes and should not occur after 2 PM.  ?Mediterranean diet is recommended  ?Follow up in 6 months ?No indication for antidementia medication at this time ?Continue to monitor her blood pressure by PCP  ?  ? ?Case discussed with Dr. Delice Lesch who agrees with the plan ? ? ? ? ?Subjective:  ? ?Karen Evans was seen today in follow up for memory loss.  ? She is a 71 year old woman with a history of hypertension, hyperlipidemia, mild vascular neurocognitive disorder who returns today for evaluation.  She is here alone. She was last seen on 02/26/2021  at which time MMSE score today is 25/30. Since her last visit, the patient "decided to take charge and enjoy a healthier lifestyle". She has joined silver Sneakers and also rides a stationary bicycle. She eats a variety of fruits and vegetables. As for activities, she enjoys drawing and painting. She also attends [employer] activities. She decided not to take Aricept, as she was having unpleasant, vivid dreams. She reports that her memory is "better".  Her mood is good, without depression, irritability or depression. She denies any headaches, dizziness trauma, or injuries to the head or falls.  She denies any numbness or tingling, unilateral weakness, double or blurred vision, hallucinations or paranoia.  She denies any vivid dreams.  She sleeps  well, wakes up very few times at night.  She takes occasionally a nap during the day.  She dresses and takes baths without help.  She denies living objects in unusual places, she cooks, and has not been leaving the stove on.  She walks without a walker.  Appetite is good, denies any diarrhea, occasional constipation, denies any trouble swallowing.  No incontinence or retention  She drives, and denies having gotten lost, paying  close attention to the signals. ?  ?History on Initial Assessment 06/07/2018: This is a 71 year old right-handed woman with a history of hypertension, hyperlipidemia, presenting for evaluation of memory concerns. She reports that she started noticing memory changes "when they started letting people go at the plant in 2016." She noticed she was forgetting things. She reported symptoms to Dr. Quay Burow in November 2019 where she would leave food out without putting it away, one time she left ice cream container out after eating it, and left a doggie bag or groceries in her car. She has burnt food she forgot was cooking and now has to make herself stay in the kitchen. She lost her keys one time and has started putting them in one place. She denies getting lost driving. No missed bills or medications. 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 She lives alone, her family (mother and brother) live in [place]. She has told them of her memory concerns, "they don't think I need to  come here." Her maternal aunt had memory issues. No history of concussions or alcohol use. ?  ?She has occasional right hand and left finger tingling. She denies any headaches, dizziness, diplopia, dysarthria/dysphagia, neck/back pain, bowel/bladder dysfunction, anosmia, or tremors. No falls. Sleep is good. Mood "I think it's good." There is no family to corroborate history but there does not appear to be any paranoia or hallucinations.  ?   ?Diagnostic Data: ?MRI brain without contrast done 07/2018 which did not show any acute changes. There was moderately advanced chronic microvascular disease.  ? ? ?PREVIOUS MEDICATIONS: None ? ?CURRENT MEDICATIONS:  ?Outpatient Encounter Medications as of 08/31/2021  ?Medication Sig  ? acetaminophen (TYLENOL) 500 MG tablet Take 250-500 mg by mouth every 6 (six) hours as needed for headache (pain).  ? atenolol (TENORMIN) 25 MG tablet Take 1 tablet (25 mg total) by mouth daily.  ? Calcium Carbonate-Vitamin D (CALTRATE 600+D PO) Take 1 tablet by mouth daily.   ? cholecalciferol (VITAMIN D) 1000 units tablet Take 1,000 Units by mouth daily.  ? donepezil (ARICEPT) 5 MG tablet Take 1 tablet (5 mg total) by mouth at bedtime.  ? hydrocortisone (ANUSOL-HC) 25 MG suppository Place 25 mg rectally as needed for hemorrhoids or anal itching.  ? Lactase (LACTAID PO) Take 1 tablet by mouth 2 (two) times daily as needed (stomach upset/ gas).   ? Multiple Vitamin (MULTIVITAMIN WITH MINERALS) TABS tablet Take 1 tablet by mouth daily.  ? rosuvastatin (CRESTOR) 5 MG tablet TAKE 1 TABLET BY MOUTH 3 TIMES A WEEK  ? telmisartan (MICARDIS) 80 MG tablet TAKE 1 TABLET BY MOUTH  DAILY  ? ?No facility-administered encounter medications on file as of 08/31/2021.  ? ? ? ?Objective:  ?  ? ?PHYSICAL EXAMINATION:   ? ?VITALS:   ?Vitals:  ? 08/31/21 1541  ?BP: (!) 144/64  ?Pulse: 88  ?Resp: 18  ?SpO2: 95%  ?Weight: 151 lb (68.5 kg)  ?Height: '5\' 3"'$  (1.6 m)  ? ? ? ?GEN:  The patient appears stated age and is in NAD. ?HEENT:  Normocephalic, atraumatic.  ? ?Neurological examination: ? ?Orientation: The patient is alert. Oriented to person, place and date  ?Cranial nerves: There is good facial symmetry.The speech is fluent and clear  Hearing is intact to conversational tone. ?Sensation: Sensation is intact to light touch throughout ?Motor: Strength is at least antigravity x4. ? ?Movement examination: ?Tone: There is normal tone in the UE/LE ?Abnormal  movements:  no tremor.  No myoclonus.  No asterixis.   ?Coordination:  There is no decremation with RAM's. ?Gait and Station: The patient has no difficulty arising out of a deep-seated chair without the use of the hands. The patient's stride length is good.   ? ? ?  08/31/2021  ?  3:00 PM 08/26/2020  ? 11:00 AM  ?MMSE - Mini Mental State Exam  ?Orientation to time 5 5  ?Orientation to Place 5 5  ?Registration 3 3  ?Attention/ Calculation 5 0  ?Recall 3 3  ?Language- name 2 objects 2 2  ?Language- repeat 1 1  ?Language- follow 3 step command 3 3  ?Language- read & follow direction 1 1  ?Write a sentence 1 1  ?Copy design 1 1  ?Total score 30 25  ?  ? ? ? ?Total time spent on today's visit was 30 minutes, including both face-to-face time and nonface-to-face time.  Time included that spent on review of records (prior notes available to me/labs/imaging if pertinent), discussing treatment and  goals, answering patient's questions and coordinating care. ? ?Cc:  Binnie Rail, MD ?Sharene Butters, PA-C  ?

## 2021-08-31 NOTE — Patient Instructions (Addendum)
It was a pleasure to see you today at our office.  ? ?Recommendations: ? ?Meds: ?Follow up in 6 months ?Continue to monitor your Blood pressure ?Continue to stay hydrated  ?Continue to eat healthy ?Continue brain excercises ? ? ?RECOMMENDATIONS FOR ALL PATIENTS WITH MEMORY PROBLEMS: ?1. Continue to exercise (Recommend 30 minutes of walking everyday, or 3 hours every week) ?2. Increase social interactions - continue going to [place] and enjoy social gatherings with friends and family ?3. Eat healthy, avoid fried foods and eat more fruits and vegetables ?4. Maintain adequate blood pressure, blood sugar, and blood cholesterol level. Reducing the risk of stroke and cardiovascular disease also helps promoting better memory. ?5. Avoid stressful situations. Live a simple life and avoid aggravations. Organize your time and prepare for the next day in anticipation. ?6. Sleep well, avoid any interruptions of sleep and avoid any distractions in the bedroom that may interfere with adequate sleep quality ?7. Avoid sugar, avoid sweets as there is a strong link between excessive sugar intake, diabetes, and cognitive impairment ?We discussed the Mediterranean diet, which has been shown to help patients reduce the risk of progressive memory disorders and reduces cardiovascular risk. This includes eating fish, eat fruits and green leafy vegetables, nuts like almonds and hazelnuts, walnuts, and also use olive oil. Avoid fast foods and fried foods as much as possible. Avoid sweets and sugar as sugar use has been linked to worsening of memory function. ? ?There is always a concern of gradual progression of memory problems. If this is the case, then we may need to adjust level of care according to patient needs. Support, both to the patient and caregiver, should then be put into place.  ? ? ?FALL PRECAUTIONS: Be cautious when walking. Scan the area for obstacles that may increase the risk of trips and falls. When getting up in the  mornings, sit up at the edge of the bed for a few minutes before getting out of bed. Consider elevating the bed at the head end to avoid drop of blood pressure when getting up. Walk always in a well-lit room (use night lights in the walls). Avoid area rugs or power cords from appliances in the middle of the walkways. Use a walker or a cane if necessary and consider physical therapy for balance exercise. Get your eyesight checked regularly. ? ? ?HOME SAFETY: Consider the safety of the kitchen when operating appliances like stoves, microwave oven, and blender. Consider having supervision and share cooking responsibilities until no longer able to participate in those. Accidents with firearms and other hazards in the house should be identified and addressed as well ? ? ? ? ? ? ?Mediterranean Diet ?A Mediterranean diet refers to food and lifestyle choices that are based on the traditions of countries located on the [employer]. This way of eating has been shown to help prevent certain conditions and improve outcomes for people who have chronic diseases, like kidney disease and heart disease. ?What are tips for following this plan? ?Lifestyle  ?Cook and eat meals together with your family, when possible. ?Drink enough fluid to keep your urine clear or pale yellow. ?Be physically active every day. This includes: ?Aerobic exercise like running or swimming. ?Leisure activities like gardening, walking, or housework. ?Get 7-8 hours of sleep each night. ?If recommended by your health care provider, drink red wine in moderation. This means 1 glass a day for nonpregnant women and 2 glasses a day for men. A glass of wine equals 5 oz (  150 mL). ?Reading food labels  ?Check the serving size of packaged foods. For foods such as rice and pasta, the serving size refers to the amount of cooked product, not dry. ?Check the total fat in packaged foods. Avoid foods that have saturated fat or trans fats. ?Check the ingredients list for  added sugars, such as corn syrup. ?Shopping  ?At the grocery store, buy most of your food from the areas near the walls of the store. This includes: ?Fresh fruits and vegetables (produce). ?Grains, beans, nuts, and seeds. Some of these may be available in unpackaged forms or large amounts (in bulk). ?Fresh seafood. ?Poultry and eggs. ?Low-fat dairy products. ?Buy whole ingredients instead of prepackaged foods. ?Buy fresh fruits and vegetables in-season from local farmers markets. ?Buy frozen fruits and vegetables in resealable bags. ?If you do not have access to quality fresh seafood, buy precooked frozen shrimp or canned fish, such as tuna, salmon, or sardines. ?Buy small amounts of raw or cooked vegetables, salads, or olives from the deli or salad bar at your store. ?Stock your pantry so you always have certain foods on hand, such as olive oil, canned tuna, canned tomatoes, rice, pasta, and beans. ?Cooking  ?Cook foods with extra-virgin olive oil instead of using butter or other vegetable oils. ?Have meat as a side dish, and have vegetables or grains as your main dish. This means having meat in small portions or adding small amounts of meat to foods like pasta or stew. ?Use beans or vegetables instead of meat in common dishes like chili or lasagna. ?Experiment with different cooking methods. Try roasting or broiling vegetables instead of steaming or saut?eing them. ?Add frozen vegetables to soups, stews, pasta, or rice. ?Add nuts or seeds for added healthy fat at each meal. You can add these to yogurt, salads, or vegetable dishes. ?Marinate fish or vegetables using olive oil, lemon juice, garlic, and fresh herbs. ?Meal planning  ?Plan to eat 1 vegetarian meal one day each week. Try to work up to 2 vegetarian meals, if possible. ?Eat seafood 2 or more times a week. ?Have healthy snacks readily available, such as: ?Vegetable sticks with hummus. ?[place] yogurt. ?Fruit and nut trail mix. ?Eat balanced meals throughout  the week. This includes: ?Fruit: 2-3 servings a day ?Vegetables: 4-5 servings a day ?Low-fat dairy: 2 servings a day ?Fish, poultry, or lean meat: 1 serving a day ?Beans and legumes: 2 or more servings a week ?Nuts and seeds: 1-2 servings a day ?Whole grains: 6-8 servings a day ?Extra-virgin olive oil: 3-4 servings a day ?Limit red meat and sweets to only a few servings a month ?What are my food choices? ?Mediterranean diet ?Recommended ?Grains: Whole-grain pasta. Brown rice. Bulgar wheat. Polenta. Couscous. Whole-wheat bread. [place]. ?Vegetables: Artichokes. Beets. Broccoli. Cabbage. Carrots. Eggplant. Green beans. Chard. Kale. Spinach. Onions. Leeks. Peas. Squash. Tomatoes. Peppers. Radishes. ?Fruits: Apples. Apricots. Avocado. Berries. Bananas. Cherries. Dates. Figs. Grapes. Lemons. Melon. Oranges. Peaches. Plums. Pomegranate. ?Meats and other protein foods: Beans. Almonds. Sunflower seeds. Pine nuts. Peanuts. [place]. Salmon. Scallops. Shrimp. [place]. Tilapia. Clams. Oysters. Eggs. ?Dairy: Low-fat milk. Cheese. Greek yogurt. ?Beverages: Water. Red wine. Herbal tea. ?Fats and oils: Extra virgin olive oil. Avocado oil. Grape seed oil. ?Sweets and desserts: [place] yogurt with honey. Baked apples. Poached pears. Trail mix. ?Seasoning and other foods: Basil. Cilantro. Coriander. Cumin. Mint. Parsley. Sage. Rosemary. Tarragon. Garlic. Oregano. Thyme. Pepper. Balsalmic vinegar. Tahini. Hummus. Tomato sauce. Olives. Mushrooms. ?Limit these ?Grains: Prepackaged pasta or rice dishes. Prepackaged  cereal with added sugar. ?Vegetables: Deep fried potatoes (french fries). ?Fruits: Fruit canned in syrup. ?Meats and other protein foods: Beef. Pork. Lamb. Poultry with skin. Hot dogs. [place] Salines. ?Dairy: Ice cream. Sour cream. Whole milk. ?Beverages: Juice. Sugar-sweetened soft drinks. Beer. Liquor and spirits. ?Fats and oils: Butter. Canola oil. Vegetable oil. Beef fat (tallow). Lard. ?Sweets and desserts: Cookies. Cakes. Pies.  Candy. ?Seasoning and other foods: Mayonnaise. Premade sauces and marinades. ?The items listed may not be a complete list. Talk with your dietitian about what dietary choices are right for you. ?Summary ?The M

## 2021-09-23 ENCOUNTER — Ambulatory Visit (HOSPITAL_BASED_OUTPATIENT_CLINIC_OR_DEPARTMENT_OTHER): Payer: Medicare Other | Admitting: Internal Medicine

## 2021-09-29 ENCOUNTER — Encounter: Payer: Self-pay | Admitting: Internal Medicine

## 2021-10-06 NOTE — Progress Notes (Unsigned)
    Subjective:    Patient ID: Karen Evans, female    DOB: 1950/10/28, 71 y.o.   MRN: [id number]      HPI Karen Evans is here for No chief complaint on file.    Vaginal dryness, soreness - ? Referral to gyn -     Medications and allergies reviewed with patient and updated if appropriate.  Current Outpatient Medications on File Prior to Visit  Medication Sig Dispense Refill   acetaminophen (TYLENOL) 500 MG tablet Take 250-500 mg by mouth every 6 (six) hours as needed for headache (pain).     atenolol (TENORMIN) 25 MG tablet Take 1 tablet (25 mg total) by mouth daily. 90 tablet 1   Calcium Carbonate-Vitamin D (CALTRATE 600+D PO) Take 1 tablet by mouth daily.      cholecalciferol (VITAMIN D) 1000 units tablet Take 1,000 Units by mouth daily.     donepezil (ARICEPT) 5 MG tablet Take 1 tablet (5 mg total) by mouth at bedtime. 30 tablet 11   hydrocortisone (ANUSOL-HC) 25 MG suppository Place 25 mg rectally as needed for hemorrhoids or anal itching.     Lactase (LACTAID PO) Take 1 tablet by mouth 2 (two) times daily as needed (stomach upset/ gas).      Multiple Vitamin (MULTIVITAMIN WITH MINERALS) TABS tablet Take 1 tablet by mouth daily.     rosuvastatin (CRESTOR) 5 MG tablet TAKE 1 TABLET BY MOUTH 3 TIMES A WEEK 39 tablet 2   telmisartan (MICARDIS) 80 MG tablet TAKE 1 TABLET BY MOUTH  DAILY 90 tablet 1   No current facility-administered medications on file prior to visit.    Review of Systems     Objective:  There were no vitals filed for this visit. BP Readings from Last 3 Encounters:  08/31/21 (!) 144/64  08/11/21 134/70  06/02/21 135/72   Wt Readings from Last 3 Encounters:  08/31/21 151 lb (68.5 kg)  08/11/21 150 lb 9.6 oz (68.3 kg)  06/02/21 151 lb 12.8 oz (68.9 kg)   There is no height or weight on file to calculate BMI.    Physical Exam         Assessment & Plan:    See Problem List for Assessment and Plan of chronic medical problems.

## 2021-10-06 NOTE — Patient Instructions (Addendum)
       You can try Replens which is an over the count vaginal lubricant.    A referral was ordered for gynecology.     Someone from that office will call you to schedule an appointment.   [place]: [phone]     Return in about 6 months (around 04/09/2022) for follow up.

## 2021-10-07 ENCOUNTER — Encounter: Payer: Self-pay | Admitting: Internal Medicine

## 2021-10-07 ENCOUNTER — Ambulatory Visit (INDEPENDENT_AMBULATORY_CARE_PROVIDER_SITE_OTHER): Payer: Medicare Other | Admitting: Internal Medicine

## 2021-10-07 VITALS — BP 130/76 | HR 61 | Temp 97.9°F | Ht 63.0 in | Wt 149.0 lb

## 2021-10-07 DIAGNOSIS — I10 Essential (primary) hypertension: Secondary | ICD-10-CM | POA: Diagnosis not present

## 2021-10-07 DIAGNOSIS — N951 Menopausal and female climacteric states: Secondary | ICD-10-CM

## 2021-10-07 NOTE — Assessment & Plan Note (Signed)
Chronic BP well controlled Continue atenolol 25 mg daily, telmisartan 80 mg daily   

## 2021-10-16 DIAGNOSIS — H40023 Open angle with borderline findings, high risk, bilateral: Secondary | ICD-10-CM | POA: Diagnosis not present

## 2021-10-19 ENCOUNTER — Ambulatory Visit (INDEPENDENT_AMBULATORY_CARE_PROVIDER_SITE_OTHER): Payer: Medicare Other | Admitting: Obstetrics and Gynecology

## 2021-10-19 ENCOUNTER — Encounter: Payer: Self-pay | Admitting: Obstetrics and Gynecology

## 2021-10-19 VITALS — BP 128/72 | HR 68 | Ht 63.0 in | Wt 149.0 lb

## 2021-10-19 DIAGNOSIS — N952 Postmenopausal atrophic vaginitis: Secondary | ICD-10-CM

## 2021-10-19 DIAGNOSIS — N760 Acute vaginitis: Secondary | ICD-10-CM

## 2021-10-19 LAB — WET PREP FOR TRICH, YEAST, CLUE

## 2021-10-19 MED ORDER — BETAMETHASONE VALERATE 0.1 % EX OINT: TOPICAL_OINTMENT | CUTANEOUS | 0 refills | Status: DC

## 2021-10-19 NOTE — Progress Notes (Addendum)
71 y.o. No obstetric history on file. Single Black or African American Not Hispanic or Latino female here for vaginal dryness.  She c/o intermittent vaginal dryness and soreness. 1-2 weeks ago she was woken up by soreness in the vaginal area. She tried some OTC cream and it didn't help. The discomfort is in one spot on the left side.  Not sexually active for a long time. No vaginal bleeding.  No itching or d/c.     No LMP recorded. Patient is postmenopausal.          Sexually active: No.  The current method of family planning is post menopausal status.    Exercising: Yes.     [facility]  Smoker:  no  Health Maintenance: Pap:  01/26/10 normal  History of abnormal Pap:  no MMG:  02/20/21 Bi-rads 1 neg  BMD:   07/24/17 osteoporosis  Colonoscopy: 07/29/16 polyps follow up 5 years She has scheduled for July  TDap:  02/15/21 Gardasil: none    reports that she has never smoked. She has never used smokeless tobacco. She reports that she does not drink alcohol and does not use drugs.  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   Heart murmur    Hiatal hernia 2002   EGD   Hypertension    Internal hemorrhoids    Migraines    when takes vitamins    Rectal polyp 01/24/2012   TUBULAR ADENOMA   Reflux esophagitis 2007   EGD   Stricture and stenosis of esophagus 2007   EGD   Tubular adenoma of colon    Ulcer    Vitamin D deficiency     Past Surgical History:  Procedure Laterality Date   COLONOSCOPY     polyps;granular ileitis 2006;2007 normal, Dr Sharlett Iles   HEMORRHOID SURGERY     OPERATIVE HYSTEROSCOPY  08-05-04   Dr Garwin Brothers   OVARIAN CYST REMOVAL     POLYPECTOMY     UPPER GASTROINTESTINAL ENDOSCOPY      Current Outpatient  Medications  Medication Sig Dispense Refill   acetaminophen (TYLENOL) 500 MG tablet Take 250-500 mg by mouth every 6 (six) hours as needed for headache (pain).     atenolol (TENORMIN) 25 MG tablet Take 1 tablet (25 mg total) by mouth daily. 90 tablet 1   Calcium Carbonate-Vitamin D (CALTRATE 600+D PO) Take 1 tablet by mouth daily.      cholecalciferol (VITAMIN D) 1000 units tablet Take 1,000 Units by mouth daily.     donepezil (ARICEPT) 5 MG tablet Take 1 tablet (5 mg total) by mouth at bedtime. 30 tablet 11   hydrocortisone (ANUSOL-HC) 25 MG suppository Place 25 mg rectally as needed for hemorrhoids or anal itching.     Lactase (LACTAID PO) Take 1 tablet by mouth 2 (two) times daily as needed (stomach upset/ gas).      Multiple Vitamin (MULTIVITAMIN WITH MINERALS) TABS tablet Take 1 tablet by mouth daily.     rosuvastatin (CRESTOR) 5 MG tablet TAKE 1 TABLET BY MOUTH 3 TIMES A WEEK 39 tablet 2   telmisartan (MICARDIS) 80 MG tablet  TAKE 1 TABLET BY MOUTH  DAILY 90 tablet 1   No current facility-administered medications for this visit.    Family History  Problem Relation Age of Onset   Lymphoma Father    Diabetes Mother    Hypertension Mother    Heart disease Mother    Penile cancer Paternal Grandfather    Diabetes Brother        CAD; Dialysis   Hypertension Brother    Heart attack Other        1/2 maternal aunts X 2   Rectal cancer Paternal Grandmother    Colon cancer Maternal Grandmother    Cancer Other    COPD Neg Hx    Esophageal cancer Neg Hx    Stomach cancer Neg Hx    Stroke Neg Hx     Review of Systems  Genitourinary:  Positive for vaginal pain.       Vaginal dryness  All other systems reviewed and are negative.  Exam:   BP 128/72   Pulse 68   Ht '5\' 3"'$  (1.6 m)   Wt 149 lb (67.6 kg)   SpO2 99%   BMI 26.39 kg/m   Weight change: '@WEIGHTCHANGE'$ @ Height:   Height: '5\' 3"'$  (160 cm)  Ht Readings from Last 3 Encounters:  10/19/21 '5\' 3"'$  (1.6 m)  10/07/21 '5\' 3"'$  (1.6 m)   08/31/21 '5\' 3"'$  (1.6 m)    General appearance: alert, cooperative and appears stated age Head: Normocephalic, without obvious abnormality, atraumatic Neck: no adenopathy, supple, symmetrical, trachea midline and thyroid normal to inspection and palpation Lungs: clear to auscultation bilaterally Cardiovascular: regular rate and rhythm Breasts: normal appearance, no masses or tenderness Abdomen: soft, non-tender; non distended,  no masses,  no organomegaly Extremities: extremities normal, atraumatic, no cyanosis or edema Skin: Skin color, texture, turgor normal. No rashes or lesions Lymph nodes: Cervical, supraclavicular, and axillary nodes normal. No abnormal inguinal nodes palpated Neurologic: Grossly normal   Pelvic: External genitalia:  no lesions              Urethra:  normal appearing urethra with no masses, tenderness or lesions              Bartholins and Skenes: normal                 Vagina: normal appearing vagina with normal color and discharge, no lesions              Cervix: no lesions                Gae Dry chaperoned for the exam.  1. Vulvovaginitis No concerning findings on exam. She points to her left labia minora as the area of most discomfort.  - WET PREP FOR TRICH, YEAST, CLUE - SureSwab Advanced Vaginitis, TMA - betamethasone valerate ointment (VALISONE) 0.1 %; Apply a pea sized amount BID for up to 2 weeks as needed  Dispense: 30 g; Refill: 0 -Discussed vulvar skin care in detail, handout given from up to date. Discussed use of vaseline externally.   2. Vaginal atrophy Discussed replens vaginal moisturizer. If the internal dryness is bothersome, could consider vaginal estrogen  She will schedule a breast and pelvic exam. She is overdue for a pap  Addendum: error above. Her exam only included vitals, general and pelvic exam

## 2021-10-20 ENCOUNTER — Ambulatory Visit (INDEPENDENT_AMBULATORY_CARE_PROVIDER_SITE_OTHER)
Admission: RE | Admit: 2021-10-20 | Discharge: 2021-10-20 | Disposition: A | Discharge status: Home / Self Care | Payer: Medicare Other | Source: Ambulatory Visit | Attending: Internal Medicine | Admitting: Internal Medicine

## 2021-10-20 DIAGNOSIS — M85852 Other specified disorders of bone density and structure, left thigh: Secondary | ICD-10-CM

## 2021-10-20 LAB — SURESWAB® ADVANCED VAGINITIS,TMA
CANDIDA SPECIES: NOT DETECTED
Candida glabrata: NOT DETECTED
SURESWAB(R) ADV BACTERIAL VAGINOSIS(BV),TMA: NEGATIVE
TRICHOMONAS VAGINALIS (TV),TMA: NOT DETECTED

## 2021-11-11 NOTE — Progress Notes (Deleted)
71 y.o. G0P0000 Single Black or African American Not Hispanic or Latino female here for annual exam.      No LMP recorded. Patient is postmenopausal.          Sexually active: {yes no:314532}  The current method of family planning is {contraception:315051}.    Exercising: {yes no:314532}  {types:19826} Smoker:  {YES [id number]  Health Maintenance: Pap:  01/26/10 normal  History of abnormal Pap:  no MMG: 02/20/21 Bi-rads 1 neg  BMD:   07/24/17 osteoporosis  Colonoscopy: 07/29/16 polyps follow up 5 years She has scheduled for July  TDap:  02/15/21 Gardasil: none    reports that she has never smoked. She has never used smokeless tobacco. She reports that she does not drink alcohol and does not use drugs.  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   Heart murmur    Hiatal hernia 2002   EGD   Hypertension    Internal hemorrhoids    Migraines    when takes vitamins    Rectal polyp 01/24/2012   TUBULAR ADENOMA   Reflux esophagitis 2007   EGD   Stricture and stenosis of esophagus 2007   EGD   Tubular adenoma of colon    Ulcer    Vitamin D deficiency     Past Surgical History:  Procedure Laterality Date   COLONOSCOPY     polyps;granular ileitis 2006;2007 normal, Dr Sharlett Iles   HEMORRHOID SURGERY     OPERATIVE HYSTEROSCOPY  08-05-04   Dr Garwin Brothers   OVARIAN CYST REMOVAL     POLYPECTOMY     UPPER GASTROINTESTINAL ENDOSCOPY      Current Outpatient Medications  Medication Sig Dispense Refill   acetaminophen (TYLENOL) 500 MG tablet Take 250-500 mg by mouth every 6 (six) hours as needed for headache (pain).     atenolol (TENORMIN) 25 MG tablet Take 1 tablet (25 mg total) by mouth daily. 90 tablet 1   betamethasone valerate  ointment (VALISONE) 0.1 % Apply a pea sized amount BID for up to 2 weeks as needed 30 g 0   Calcium Carbonate-Vitamin D (CALTRATE 600+D PO) Take 1 tablet by mouth daily.      cholecalciferol (VITAMIN D) 1000 units tablet Take 1,000 Units by mouth daily.     donepezil (ARICEPT) 5 MG tablet Take 1 tablet (5 mg total) by mouth at bedtime. 30 tablet 11   hydrocortisone (ANUSOL-HC) 25 MG suppository Place 25 mg rectally as needed for hemorrhoids or anal itching.     Lactase (LACTAID PO) Take 1 tablet by mouth 2 (two) times daily as needed (stomach upset/ gas).      Multiple Vitamin (MULTIVITAMIN WITH MINERALS) TABS tablet Take 1 tablet by mouth daily.     rosuvastatin (CRESTOR) 5 MG tablet TAKE 1 TABLET BY MOUTH 3 TIMES A WEEK 39 tablet 2   telmisartan (MICARDIS) 80 MG tablet TAKE 1 TABLET BY MOUTH  DAILY 90 tablet 1   No current facility-administered medications for this visit.    Family History  Problem Relation Age of Onset   Lymphoma Father    Diabetes Mother  Hypertension Mother    Heart disease Mother    Penile cancer Paternal Grandfather    Diabetes Brother        CAD; Dialysis   Hypertension Brother    Heart attack Other        1/2 maternal aunts X 2   Rectal cancer Paternal Grandmother    Colon cancer Maternal Grandmother    Cancer Other    COPD Neg Hx    Esophageal cancer Neg Hx    Stomach cancer Neg Hx    Stroke Neg Hx     Review of Systems  Exam:   There were no vitals taken for this visit.  Weight change: '@WEIGHTCHANGE'$ @ Height:      Ht Readings from Last 3 Encounters:  10/19/21 '5\' 3"'$  (1.6 m)  10/07/21 '5\' 3"'$  (1.6 m)  08/31/21 '5\' 3"'$  (1.6 m)    General appearance: alert, cooperative and appears stated age Head: Normocephalic, without obvious abnormality, atraumatic Neck: no adenopathy, supple, symmetrical, trachea midline and thyroid {CHL AMB PHY EX THYROID NORM DEFAULT:[phone]::"normal to inspection and palpation"} Lungs: clear to auscultation  bilaterally Cardiovascular: regular rate and rhythm Breasts: {Exam; breast:13139::"normal appearance, no masses or tenderness"} Abdomen: soft, non-tender; non distended,  no masses,  no organomegaly Extremities: extremities normal, atraumatic, no cyanosis or edema Skin: Skin color, texture, turgor normal. No rashes or lesions Lymph nodes: Cervical, supraclavicular, and axillary nodes normal. No abnormal inguinal nodes palpated Neurologic: Grossly normal   Pelvic: External genitalia:  no lesions              Urethra:  normal appearing urethra with no masses, tenderness or lesions              Bartholins and Skenes: normal                 Vagina: normal appearing vagina with normal color and discharge, no lesions              Cervix: {CHL AMB PHY EX CERVIX NORM [phone]::"no lesions"}               Bimanual Exam:  Uterus:  {CHL AMB PHY EX UTERUS NORM [phone]::"normal size, contour, position, consistency, mobility, non-tender"}              Adnexa: {CHL AMB PHY EX ADNEXA NO MASS DEFAULT:[phone]::"no mass, fullness, tenderness"}               Rectovaginal: Confirms               Anus:  normal sphincter tone, no lesions  *** chaperoned for the exam.  A:  Well Woman with normal exam  P:

## 2021-11-13 ENCOUNTER — Other Ambulatory Visit: Payer: Self-pay | Admitting: Internal Medicine

## 2021-11-19 ENCOUNTER — Ambulatory Visit: Payer: Medicare Other | Admitting: Obstetrics and Gynecology

## 2021-11-24 ENCOUNTER — Telehealth: Payer: Self-pay | Admitting: *Deleted

## 2021-11-24 NOTE — Telephone Encounter (Signed)
Pt RS to 7-14 FRI at 130 pm

## 2021-11-24 NOTE — Telephone Encounter (Signed)
Patient no showed PV today at 1 pm - Called patient and left message to return call by 5 pm today on home and cell number - If no call by 5 pm, PV and procedure will be canceled -

## 2021-11-25 ENCOUNTER — Encounter: Payer: Self-pay | Admitting: Obstetrics and Gynecology

## 2021-11-25 ENCOUNTER — Other Ambulatory Visit (HOSPITAL_COMMUNITY)
Admission: RE | Admit: 2021-11-25 | Discharge: 2021-11-25 | Disposition: A | Discharge status: Home / Self Care | Payer: Medicare Other | Source: Ambulatory Visit | Attending: Obstetrics and Gynecology | Admitting: Obstetrics and Gynecology

## 2021-11-25 ENCOUNTER — Ambulatory Visit (INDEPENDENT_AMBULATORY_CARE_PROVIDER_SITE_OTHER): Payer: Medicare Other | Admitting: Obstetrics and Gynecology

## 2021-11-25 VITALS — BP 124/70 | HR 72 | Ht 63.0 in | Wt 148.0 lb

## 2021-11-25 DIAGNOSIS — Z124 Encounter for screening for malignant neoplasm of cervix: Secondary | ICD-10-CM

## 2021-11-25 DIAGNOSIS — N952 Postmenopausal atrophic vaginitis: Secondary | ICD-10-CM

## 2021-11-25 DIAGNOSIS — Z01419 Encounter for gynecological examination (general) (routine) without abnormal findings: Secondary | ICD-10-CM

## 2021-11-25 NOTE — Patient Instructions (Signed)

## 2021-11-25 NOTE — Progress Notes (Signed)
71 y.o. G0P0000 Single Black or African American Not Hispanic or Latino female here for annual exam.  No vaginal bleeding, not sexually active.   She was seen for a vulvitis last month, negative vaginitis panel. Symptoms have improved with conservative management and short term steroid.    No LMP recorded. Patient is postmenopausal.          Sexually active: No.  The current method of family planning is post menopausal status.    Exercising: Yes.     Stretching walking  Smoker:  no  Health Maintenance: Pap:01/26/10 normal  History of abnormal Pap:  no MMG:  02/20/21 Bi-rads 1 neg  BMD:   07/24/17 osteopenia, FRAX 4.3/0.6% (with primary) Colonoscopy: 07/29/16 polyps follow up 5 years She has scheduled for July, 25,23  TDap:  02/15/21 Gardasil: none    reports that she has never smoked. She has never used smokeless tobacco. She reports that she does not drink alcohol and does not use drugs. Retired.   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   Heart murmur    Hiatal hernia 2002   EGD   Hypertension    Internal hemorrhoids    Migraines    when takes vitamins    Rectal polyp 01/24/2012   TUBULAR ADENOMA   Reflux esophagitis 2007   EGD   Stricture and stenosis of esophagus 2007   EGD   Tubular adenoma of colon    Ulcer    Vitamin D deficiency     Past Surgical History:  Procedure Laterality Date   COLONOSCOPY     polyps;granular ileitis 2006;2007 normal, Dr Sharlett Iles   HEMORRHOID SURGERY     OPERATIVE HYSTEROSCOPY  08-05-04   Dr Garwin Brothers   OVARIAN CYST REMOVAL     POLYPECTOMY     UPPER GASTROINTESTINAL ENDOSCOPY      Current Outpatient Medications  Medication Sig Dispense Refill   acetaminophen (TYLENOL) 500 MG tablet  Take 250-500 mg by mouth every 6 (six) hours as needed for headache (pain).     atenolol (TENORMIN) 25 MG tablet TAKE 1 TABLET BY MOUTH DAILY 90 tablet 3   betamethasone valerate ointment (VALISONE) 0.1 % Apply a pea sized amount BID for up to 2 weeks as needed 30 g 0   Calcium Carbonate-Vitamin D (CALTRATE 600+D PO) Take 1 tablet by mouth daily.      cholecalciferol (VITAMIN D) 1000 units tablet Take 1,000 Units by mouth daily.     donepezil (ARICEPT) 5 MG tablet Take 1 tablet (5 mg total) by mouth at bedtime. 30 tablet 11   fexofenadine (ALLEGRA) 180 MG tablet Take 180 mg by mouth daily.     hydrocortisone (ANUSOL-HC) 25 MG suppository Place 25 mg rectally as needed for hemorrhoids or anal itching.     Lactase (LACTAID PO) Take 1 tablet by mouth 2 (two) times daily as needed (stomach upset/ gas).      Multiple Vitamin (MULTIVITAMIN WITH MINERALS) TABS tablet Take 1 tablet by mouth daily.     rosuvastatin (CRESTOR) 5 MG tablet TAKE 1 TABLET BY MOUTH 3 TIMES A WEEK 39 tablet  2   telmisartan (MICARDIS) 80 MG tablet TAKE 1 TABLET BY MOUTH  DAILY 90 tablet 1   No current facility-administered medications for this visit.    Family History  Problem Relation Age of Onset   Lymphoma Father    Diabetes Mother    Hypertension Mother    Heart disease Mother    Penile cancer Paternal Grandfather    Diabetes Brother        CAD; Dialysis   Hypertension Brother    Heart attack Other        1/2 maternal aunts X 2   Rectal cancer Paternal Grandmother    Colon cancer Maternal Grandmother    Cancer Other    COPD Neg Hx    Esophageal cancer Neg Hx    Stomach cancer Neg Hx    Stroke Neg Hx     Review of Systems  All other systems reviewed and are negative.   Exam:   BP 124/70   Pulse 72   Ht '5\' 3"'$  (1.6 m)   Wt 148 lb (67.1 kg)   SpO2 99%   BMI 26.22 kg/m   Weight change: '@WEIGHTCHANGE'$ @ Height:   Height: '5\' 3"'$  (160 cm)  Ht Readings from Last 3 Encounters:  11/25/21 '5\' 3"'$  (1.6 m)   10/19/21 '5\' 3"'$  (1.6 m)  10/07/21 '5\' 3"'$  (1.6 m)    General appearance: alert, cooperative and appears stated age Head: Normocephalic, without obvious abnormality, atraumatic Neck: no adenopathy, supple, symmetrical, trachea midline and thyroid normal to inspection and palpation Breasts: normal appearance, no masses or tenderness Abdomen: soft, non-tender; non distended,  no masses,  no organomegaly Extremities: extremities normal, atraumatic, no cyanosis or edema Skin: Skin color, texture, turgor normal. No rashes or lesions Lymph nodes: Cervical, supraclavicular, and axillary nodes normal. No abnormal inguinal nodes palpated Neurologic: Grossly normal   Pelvic: External genitalia:  no lesions              Urethra:  normal appearing urethra with no masses, tenderness or lesions              Bartholins and Skenes: normal                 Vagina: atrophic appearing vagina with normal color and discharge, no lesions              Cervix: no lesions               Bimanual Exam:  Uterus:   no masses or tenderness              Adnexa: no mass, fullness, tenderness               Rectovaginal: Confirms               Anus:  normal sphincter tone, no lesions  Gae Dry chaperoned for the exam.  1. Encounter for breast and pelvic examination Mammogram due in 10/23 Colonoscopy is scheduled DEXA UTD with primary  2. Screening for cervical cancer - Cytology - PAP

## 2021-11-27 ENCOUNTER — Ambulatory Visit (AMBULATORY_SURGERY_CENTER): Payer: Self-pay | Admitting: *Deleted

## 2021-11-27 ENCOUNTER — Encounter: Payer: Self-pay | Admitting: Internal Medicine

## 2021-11-27 VITALS — Ht 63.0 in | Wt 148.0 lb

## 2021-11-27 DIAGNOSIS — Z86010 Personal history of colonic polyps: Secondary | ICD-10-CM

## 2021-11-27 MED ORDER — NA SULFATE-K SULFATE-MG SULF 17.5-3.13-1.6 GM/177ML PO SOLN: 1.0000 | ORAL | 0 refills | Status: DC

## 2021-11-27 NOTE — Progress Notes (Signed)
Patient is here in-person for PV. Patient denies any allergies to eggs or soy. Patient denies any problems with anesthesia/sedation. Patient is not on any oxygen at home. Patient is not taking any diet/weight loss medications or blood thinners. Went over procedure prep instructions with the patient. Patient is aware of our care-partner policy. Pt denies constipation.

## 2021-11-30 LAB — CYTOLOGY - PAP: Diagnosis: NEGATIVE

## 2021-12-08 ENCOUNTER — Observation Stay (HOSPITAL_COMMUNITY)
Admission: EM | Admit: 2021-12-08 | Discharge: 2021-12-10 | Disposition: A | Discharge status: Home / Self Care | Payer: Medicare Other | Attending: Internal Medicine | Admitting: Internal Medicine

## 2021-12-08 ENCOUNTER — Encounter (HOSPITAL_COMMUNITY): Payer: Self-pay

## 2021-12-08 ENCOUNTER — Observation Stay (HOSPITAL_COMMUNITY): Payer: Medicare Other

## 2021-12-08 ENCOUNTER — Emergency Department (HOSPITAL_COMMUNITY): Payer: Medicare Other

## 2021-12-08 ENCOUNTER — Other Ambulatory Visit: Payer: Self-pay

## 2021-12-08 ENCOUNTER — Ambulatory Visit: Payer: Medicare Other | Admitting: Internal Medicine

## 2021-12-08 DIAGNOSIS — R7303 Prediabetes: Secondary | ICD-10-CM | POA: Diagnosis present

## 2021-12-08 DIAGNOSIS — R41841 Cognitive communication deficit: Secondary | ICD-10-CM | POA: Diagnosis not present

## 2021-12-08 DIAGNOSIS — G4489 Other headache syndrome: Secondary | ICD-10-CM | POA: Diagnosis not present

## 2021-12-08 DIAGNOSIS — R001 Bradycardia, unspecified: Secondary | ICD-10-CM | POA: Diagnosis not present

## 2021-12-08 DIAGNOSIS — Z8673 Personal history of transient ischemic attack (TIA), and cerebral infarction without residual deficits: Secondary | ICD-10-CM | POA: Diagnosis present

## 2021-12-08 DIAGNOSIS — R2681 Unsteadiness on feet: Secondary | ICD-10-CM | POA: Insufficient documentation

## 2021-12-08 DIAGNOSIS — E785 Hyperlipidemia, unspecified: Secondary | ICD-10-CM | POA: Diagnosis present

## 2021-12-08 DIAGNOSIS — G459 Transient cerebral ischemic attack, unspecified: Principal | ICD-10-CM

## 2021-12-08 DIAGNOSIS — G8190 Hemiplegia, unspecified affecting unspecified side: Secondary | ICD-10-CM | POA: Diagnosis not present

## 2021-12-08 DIAGNOSIS — Z743 Need for continuous supervision: Secondary | ICD-10-CM | POA: Diagnosis not present

## 2021-12-08 DIAGNOSIS — I10 Essential (primary) hypertension: Secondary | ICD-10-CM | POA: Diagnosis not present

## 2021-12-08 DIAGNOSIS — R6889 Other general symptoms and signs: Secondary | ICD-10-CM | POA: Diagnosis not present

## 2021-12-08 DIAGNOSIS — Z20822 Contact with and (suspected) exposure to covid-19: Secondary | ICD-10-CM | POA: Insufficient documentation

## 2021-12-08 DIAGNOSIS — Z86010 Personal history of colonic polyps: Secondary | ICD-10-CM

## 2021-12-08 DIAGNOSIS — R531 Weakness: Secondary | ICD-10-CM | POA: Diagnosis not present

## 2021-12-08 DIAGNOSIS — E7800 Pure hypercholesterolemia, unspecified: Secondary | ICD-10-CM | POA: Diagnosis present

## 2021-12-08 DIAGNOSIS — Z79899 Other long term (current) drug therapy: Secondary | ICD-10-CM | POA: Diagnosis not present

## 2021-12-08 LAB — DIFFERENTIAL
Abs Immature Granulocytes: 0.02 10*3/uL (ref 0.00–0.07)
Basophils Absolute: 0 10*3/uL (ref 0.0–0.1)
Basophils Relative: 1 %
Eosinophils Absolute: 0.1 10*3/uL (ref 0.0–0.5)
Eosinophils Relative: 1 %
Immature Granulocytes: 0 %
Lymphocytes Relative: 22 %
Lymphs Abs: 1.4 10*3/uL (ref 0.7–4.0)
Monocytes Absolute: 0.4 10*3/uL (ref 0.1–1.0)
Monocytes Relative: 7 %
Neutro Abs: 4.3 10*3/uL (ref 1.7–7.7)
Neutrophils Relative %: 69 %

## 2021-12-08 LAB — CBC
HCT: 46.7 % — ABNORMAL HIGH (ref 36.0–46.0)
Hemoglobin: 14.5 g/dL (ref 12.0–15.0)
MCH: 26 pg (ref 26.0–34.0)
MCHC: 31 g/dL (ref 30.0–36.0)
MCV: 83.8 fL (ref 80.0–100.0)
Platelets: 218 10*3/uL (ref 150–400)
RBC: 5.57 MIL/uL — ABNORMAL HIGH (ref 3.87–5.11)
RDW: 13.2 % (ref 11.5–15.5)
WBC: 6.2 10*3/uL (ref 4.0–10.5)
nRBC: 0 % (ref 0.0–0.2)

## 2021-12-08 LAB — COMPREHENSIVE METABOLIC PANEL
ALT: 18 U/L (ref 0–44)
AST: 18 U/L (ref 15–41)
Albumin: 3.9 g/dL (ref 3.5–5.0)
Alkaline Phosphatase: 75 U/L (ref 38–126)
Anion gap: 10 (ref 5–15)
BUN: 6 mg/dL — ABNORMAL LOW (ref 8–23)
CO2: 25 mmol/L (ref 22–32)
Calcium: 10 mg/dL (ref 8.9–10.3)
Chloride: 104 mmol/L (ref 98–111)
Creatinine, Ser: 0.83 mg/dL (ref 0.44–1.00)
GFR, Estimated: >60 mL/min (ref >60)
Glucose, Bld: 90 mg/dL (ref 70–99)
Potassium: 3.9 mmol/L (ref 3.5–5.1)
Sodium: 139 mmol/L (ref 135–145)
Total Bilirubin: 0.7 mg/dL (ref 0.3–1.2)
Total Protein: 7.3 g/dL (ref 6.5–8.1)

## 2021-12-08 LAB — URINALYSIS, ROUTINE W REFLEX MICROSCOPIC
Bilirubin Urine: NEGATIVE
Glucose, UA: NEGATIVE mg/dL
Hgb urine dipstick: NEGATIVE
Ketones, ur: 5 mg/dL — AB
Leukocytes,Ua: NEGATIVE
Nitrite: NEGATIVE
Protein, ur: NEGATIVE mg/dL
Specific Gravity, Urine: 1.012 (ref 1.005–1.030)
pH: 5 (ref 5.0–8.0)

## 2021-12-08 LAB — I-STAT CHEM 8, ED
BUN: 11 mg/dL (ref 8–23)
Calcium, Ion: 1.18 mmol/L (ref 1.15–1.40)
Chloride: 104 mmol/L (ref 98–111)
Creatinine, Ser: 0.8 mg/dL (ref 0.44–1.00)
Glucose, Bld: 91 mg/dL (ref 70–99)
HCT: 46 % (ref 36.0–46.0)
Hemoglobin: 15.6 g/dL — ABNORMAL HIGH (ref 12.0–15.0)
Potassium: 4 mmol/L (ref 3.5–5.1)
Sodium: 138 mmol/L (ref 135–145)
TCO2: 27 mmol/L (ref 22–32)

## 2021-12-08 LAB — RESP PANEL BY RT-PCR (FLU A&B, COVID) ARPGX2
Influenza A by PCR: NEGATIVE
Influenza B by PCR: NEGATIVE
SARS Coronavirus 2 by RT PCR: NEGATIVE

## 2021-12-08 LAB — RAPID URINE DRUG SCREEN, HOSP PERFORMED
Amphetamines: NOT DETECTED
Barbiturates: NOT DETECTED
Benzodiazepines: NOT DETECTED
Cocaine: NOT DETECTED
Opiates: NOT DETECTED
Tetrahydrocannabinol: NOT DETECTED

## 2021-12-08 LAB — TROPONIN I (HIGH SENSITIVITY): Troponin I (High Sensitivity): 7 ng/L (ref <18)

## 2021-12-08 LAB — APTT: aPTT: 26 seconds (ref 24–36)

## 2021-12-08 LAB — PROTIME-INR
INR: 1 (ref 0.8–1.2)
Prothrombin Time: 13.3 seconds (ref 11.4–15.2)

## 2021-12-08 LAB — ETHANOL: Alcohol, Ethyl (B): <10 mg/dL (ref <10)

## 2021-12-08 MED ORDER — ASPIRIN 325 MG PO TABS
325.0000 mg | ORAL_TABLET | Freq: Every day | ORAL | Status: DC
  Administered 2021-12-09 – 2021-12-10 (×2): 325 mg via ORAL
  Filled 2021-12-08 (×3): qty 1

## 2021-12-08 MED ORDER — ROSUVASTATIN CALCIUM 5 MG PO TABS
5.0000 mg | ORAL_TABLET | Freq: Every day | ORAL | Status: DC
  Administered 2021-12-10: 5 mg via ORAL
  Filled 2021-12-08 (×2): qty 1

## 2021-12-08 MED ORDER — ASPIRIN 300 MG RE SUPP
300.0000 mg | Freq: Every day | RECTAL | Status: DC
  Filled 2021-12-08: qty 1

## 2021-12-08 MED ORDER — STROKE: EARLY STAGES OF RECOVERY BOOK
Freq: Once | Status: AC
  Filled 2021-12-08: qty 1

## 2021-12-08 MED ORDER — ACETAMINOPHEN 325 MG PO TABS: 650.0000 mg | ORAL_TABLET | ORAL | Status: DC | PRN

## 2021-12-08 MED ORDER — ENOXAPARIN SODIUM 40 MG/0.4ML IJ SOSY
40.0000 mg | PREFILLED_SYRINGE | INTRAMUSCULAR | Status: DC
  Administered 2021-12-08 – 2021-12-09 (×2): 40 mg via SUBCUTANEOUS
  Filled 2021-12-08 (×2): qty 0.4

## 2021-12-08 MED ORDER — ACETAMINOPHEN 650 MG RE SUPP: 650.0000 mg | RECTAL | Status: DC | PRN

## 2021-12-08 MED ORDER — ACETAMINOPHEN 160 MG/5ML PO SOLN: 650.0000 mg | ORAL | Status: DC | PRN

## 2021-12-08 MED ORDER — LORAZEPAM 2 MG/ML IJ SOLN: 0.5000 mg | Freq: Once | INTRAMUSCULAR | Status: DC | PRN

## 2021-12-08 NOTE — H&P (Signed)
History and Physical    Patient: Karen Evans:[id number] DOB: 09-16-1950 DOA: 12/08/2021 DOS: the patient was seen and examined on 12/08/2021 PCP: Binnie Rail, MD  Patient coming from: Home - lives alone; NOK: Arvella Merles, [place]   Chief Complaint: strokelike symptoms  HPI: Karen Evans is a 71 y.o. female with medical history significant of colon polyps, HLD, and HTN presenting with stroke like symptoms that she reported when she went for colonoscopy today.  She reports that she was fatigued this AM but otherwise felt well.  Shortly after arriving at the colonoscopy suite, she noticed L facial droop following by L arm and leg tingling/numbness.  Symptoms lasted about 35 minutes total and resolved completely.  Her BP was normal on arrival but rose to >956 systolic during symptoms.  She denies dysphagia or dysarthria but does exhibit mildly stuttering speech at this time.    ER Course:  TIA.  Went for colonoscopy today with report of symptoms.  Headache, diplopia, then UL weakness, numbness. Has h/o atypical migraine but neurology recommends overnight evaluation.  Negative head CT.     Review of Systems: As mentioned in the history of present illness. All other systems reviewed and are negative. 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   Heart murmur    Hiatal hernia 2002   EGD   Hypertension    Internal hemorrhoids    Migraines    when takes vitamins    Rectal polyp 01/24/2012   TUBULAR ADENOMA   Reflux esophagitis 2007   EGD   Stricture and stenosis of esophagus 2007   EGD   Tubular adenoma of colon    Ulcer    Vitamin D deficiency    Past Surgical History:  Procedure Laterality Date    COLONOSCOPY     polyps;granular ileitis 2006;2007 normal, Dr Sharlett Iles   COLONOSCOPY WITH PROPOFOL  07/2016   Dr.Pyrtle   HEMORRHOID SURGERY     OPERATIVE HYSTEROSCOPY  08/05/2004   Dr Garwin Brothers   OVARIAN CYST REMOVAL     POLYPECTOMY     UPPER GASTROINTESTINAL ENDOSCOPY     Social History:  reports that she has never smoked. She has never used smokeless tobacco. She reports that she does not drink alcohol and does not use drugs.  Allergies  Allergen Reactions   Cefprozil Hives   Erythromycin Hives   Esomeprazole Magnesium Hives   Penicillins Hives and Rash    Has patient had a PCN reaction causing immediate rash, facial/tongue/throat swelling, SOB or lightheadedness with hypotension: Yes Has patient had a PCN reaction causing severe rash involving mucus membranes or skin necrosis: No Has patient had a PCN reaction that required hospitalization: No Has patient had a PCN reaction occurring within the last 10 years: No If all of the above answers are "NO", then may proceed with Cephalosporin use.   Sulfonamide Derivatives     hives   Amlodipine Other (See Comments)    gerd   Crestor [Rosuvastatin Calcium]     itching   Crestor [Rosuvastatin]     Lightheadedness, itchy, prickly all over  Erythromycin Base Hives   Hydrochlorothiazide Other (See Comments)    lightheadedness   Pravastatin     Dizzy, muscle soreness   Sulfa Antibiotics Hives   Zetia [Ezetimibe]     Caused drowsiness, rash   Atorvastatin Other (See Comments)    Eyes itch   Doxycycline Nausea And Vomiting    Family History  Problem Relation Age of Onset   Lymphoma Father    Diabetes Mother    Hypertension Mother    Heart disease Mother    Penile cancer Paternal Grandfather    Diabetes Brother        CAD; Dialysis   Hypertension Brother    Heart attack Other        1/2 maternal aunts X 2   Rectal cancer Paternal Grandmother    Colon cancer Maternal Grandmother    Cancer Other    COPD Neg Hx     Esophageal cancer Neg Hx    Stomach cancer Neg Hx    Stroke Neg Hx     Prior to Admission medications   Medication Sig Start Date End Date Taking? Authorizing Provider  acetaminophen (TYLENOL) 500 MG tablet Take 250-500 mg by mouth every 6 (six) hours as needed for headache (pain).    [provider]  atenolol (TENORMIN) 25 MG tablet TAKE 1 TABLET BY MOUTH DAILY 11/13/21   Binnie Rail, MD  betamethasone valerate ointment (VALISONE) 0.1 % Apply a pea sized amount BID for up to 2 weeks as needed 10/19/21   Salvadore Dom, MD  Calcium Carbonate-Vitamin D (CALTRATE 600+D PO) Take 1 tablet by mouth daily.     [provider]  cholecalciferol (VITAMIN D) 1000 units tablet Take 1,000 Units by mouth daily.    [provider]  donepezil (ARICEPT) 5 MG tablet Take 1 tablet (5 mg total) by mouth at bedtime. Patient not taking: Reported on 11/27/2021 08/26/20   Rondel Jumbo, PA-C  fexofenadine (ALLEGRA) 180 MG tablet Take 180 mg by mouth daily.    [provider]  hydrocortisone (ANUSOL-HC) 25 MG suppository Place 25 mg rectally as needed for hemorrhoids or anal itching.    [provider]  Lactase (LACTAID PO) Take 1 tablet by mouth 2 (two) times daily as needed (stomach upset/ gas).     [provider]  Multiple Vitamin (MULTIVITAMIN WITH MINERALS) TABS tablet Take 1 tablet by mouth daily.    [provider]  Na Sulfate-K Sulfate-Mg Sulf 17.5-3.13-1.6 GM/177ML SOLN Take 1 kit by mouth as directed. May use generic SUPREP;NO prior authorizations will be done.Please use [employer] or [employer]RX coupon. 11/27/21   Pyrtle, Lajuan Lines, MD  rosuvastatin (CRESTOR) 5 MG tablet TAKE 1 TABLET BY MOUTH 3 TIMES A WEEK 08/06/21   Binnie Rail, MD  telmisartan (MICARDIS) 80 MG tablet TAKE 1 TABLET BY MOUTH  DAILY 08/24/21   Binnie Rail, MD    Physical Exam: Vitals:   12/08/21 0909 12/08/21 0924 12/08/21 0925 12/08/21 1154  BP: (!) 141/99   (!) 164/77   Pulse: 60   61  Resp: 16   18  Temp: 98.5 F (36.9 C)   98.1 F (36.7 C)  TempSrc: Oral   Oral  SpO2: 100% 99%  100%  Weight:   65.8 kg   Height:   5' 3"  (1.6 m)    General:  Appears calm and comfortable and is in NAD Eyes:   EOMI, normal lids, iris ENT:  grossly normal hearing, lips &  tongue, mmm; appropriate dentition Neck:  no LAD, masses or thyromegaly Cardiovascular:  RRR, no m/r/g. No LE edema.  Respiratory:   CTA bilaterally with no wheezes/rales/rhonchi.  Normal respiratory effort. Abdomen:  soft, NT, ND Skin:  no rash or induration seen on limited exam Musculoskeletal:  grossly normal tone BUE/BLE, good ROM, no bony abnormality Psychiatric:  grossly normal mood and affect, speech fluent and appropriate but with periodic stuttering, AOx3 Neurologic:  CN 2-12 grossly intact with ?subtle L mouth droop, moves all extremities in coordinated fashion   Radiological Exams on Admission: Independently reviewed - see discussion in A/P where applicable  CT HEAD WO CONTRAST  Result Date: 12/08/2021 CLINICAL DATA:  Transient ischemic attack (TIA) EXAM: CT HEAD WITHOUT CONTRAST TECHNIQUE: Contiguous axial images were obtained from the base of the skull through the vertex without intravenous contrast. RADIATION DOSE REDUCTION: This exam was performed according to the departmental dose-optimization program which includes automated exposure control, adjustment of the mA and/or kV according to patient size and/or use of iterative reconstruction technique. COMPARISON:  07/25/2018 FINDINGS: Brain: No evidence of acute infarction, hemorrhage, hydrocephalus, extra-axial collection or mass lesion/mass effect. Scattered low-density changes within the periventricular and subcortical white matter compatible with chronic microvascular ischemic change. Vascular: No hyperdense vessel or unexpected calcification. Skull: Normal. Negative for fracture or focal lesion. Sinuses/Orbits: No acute finding. Other:  None. IMPRESSION: No acute intracranial findings. Electronically Signed   By: Davina Poke D.O.   On: 12/08/2021 09:46    EKG: Independently reviewed.  Sinus bradycardia with rate 57; no evidence of acute ischemia   Labs on Admission: I have personally reviewed the available labs and imaging studies at the time of the admission.  Pertinent labs:    Unremarkable CMP Unremarkable CBC A1c 6.2 on 3/28 UA: 5 ketones ETOH <10 UDS negative   Assessment and Plan: Principal Problem:   TIA (transient ischemic attack) Active Problems:   Essential hypertension   Hyperlipidemia   Prediabetes    TIA -Patient did bowel prep in anticipation of C-scope today and developed L facial droop and LUE/LLE numbness/weakness/tingling; symptoms lasted about 35 minutes and resolved -Concerning for TIA/CVA -Aspirin has been given to reduce stroke mortality and decrease morbidity -Will place in observation status for CVA/TIA evaluation -Telemetry monitoring -MRI/MRA -Echo -Risk stratification with FLP -Recent A1c was 6.2, borderline DM -Neurology consult -PT/OT/ST/Nutrition Consults  HTN -Allow permissive HTN for now -Treat BP only if >220/120, and then with goal of 15% reduction -Hold atenolol, telmisartan and plan to restart in 48-72 hours   HLD -Check FLP - LDL was 81 in January -Resume statin but will change Crestor from 3x/week to daily       Advance Care Planning:   Code Status: Full Code   Consults: Neurology; PT/OT/ST; nutrition; TOC team  DVT Prophylaxis: Lovenox  Family Communication: Friend was present throughout evaluation  Severity of Illness: The appropriate patient status for this patient is OBSERVATION. Observation status is judged to be reasonable and necessary in order to provide the required intensity of service to ensure the patient's safety. The patient's presenting symptoms, physical exam findings, and initial radiographic and laboratory data in the context of  their medical condition is felt to place them at decreased risk for further clinical deterioration. Furthermore, it is anticipated that the patient will be medically stable for discharge from the hospital within 2 midnights of admission.   Author: Karmen Bongo, MD 12/08/2021 5:43 PM  For on call review [email or web address].

## 2021-12-08 NOTE — ED Notes (Signed)
Walked into room, patient had taken off her gown and was fully dressed. Patient stated she was going to the bathroom. Assisted patient to the bathroom.

## 2021-12-08 NOTE — ED Notes (Signed)
[facility] called for transport.

## 2021-12-08 NOTE — Progress Notes (Signed)
Pt presented today for colonoscopy. Upon check-in pt reported to [place] that she was having tingling and numbness in her left jaw and left arm. First BP check: 158/96, pulse 67. BP elevated with second check to 201/98, pulse 70. Also reported she felt a little cross eyed. MD made aware and he evaluated pt. Order received to call EMS to transport pt to hospital for possible stroke symptoms. Pt's care partner was updated on plan of care and she is to meet pt at [facility] ED. Pt had her belongings including glasses, glasses case and purse with her on the EMS stretcher.

## 2021-12-08 NOTE — ED Provider Triage Note (Signed)
Emergency Medicine Provider Triage Evaluation Note  Karen Evans , a 71 y.o. female  was evaluated in triage.  Pt complains of left-sided facial numbness and left upper extremity numbness.  Last known well 8 AM, happened at gastro office prior to colonoscopy.  Associated with headache and diplopia, symptoms have resolved upon arrival.  Denies any history of TIA stroke, not on blood thinners.  Review of Systems  Per HPI  Physical Exam  BP (!) 141/99 (BP Location: Right Arm)   Pulse 60   Temp 98.5 F (36.9 C) (Oral)   Resp 16   SpO2 100%  Gen:   Awake, no distress   Resp:  Normal effort  MSK:   Moves extremities without difficulty  Other:  Cranial nerves III through XII are grossly intact.  Visual fields roughly intact.  Grip strength is equal bilaterally, no pronator drift and normal finger-nose.  Dorsiflexion and plantarflexion are 5/5 against resistance.  There is no dysarthria on exam.  Medical Decision Making  Medically screening exam initiated at 9:21 AM.  Appropriate orders placed.  Karen Evans was informed that the remainder of the evaluation will be completed by another provider, this initial triage assessment does not replace that evaluation, and the importance of remaining in the ED until their evaluation is complete.  No current focal deficits, not a code stroke.  We will work-up for TIA, my attending Dr. Eulis Foster evaluated patient with me and agrees not a code stroke.   Sherrill Raring, [place] 12/08/21 [phone]

## 2021-12-08 NOTE — Progress Notes (Signed)
Patient presenting to preprocedure evaluation and triage for planned colonoscopy at 9 AM On questioning from medical assistant and nursing staff she reports developing left facial numbness around her mouth as well as some left arm numbness.  Symptoms seems to be coming and going but present currently.  Also reports feeling disoriented this morning. Her care partner, Vaughan Basta, reports the patient did not mention symptoms on the way here.  Vital signs are stable Decision made to alert 911 for evaluation to rule out stroke and emergency department. I spoke directly to EMS and they will send emergency transport immediately  Procedure canceled for today

## 2021-12-08 NOTE — ED Provider Notes (Signed)
[facility] EMERGENCY DEPARTMENT Provider Note   CSN: [id number] Arrival date & time: 12/08/21  0907     History  Chief Complaint  Patient presents with   Headache   Weakness    Karen Evans is a 71 y.o. female.   Headache Associated symptoms: weakness   Weakness Associated symptoms: headaches   Patient sent in from endoscopy.  This morning had a headache and double vision.  This is not unusual for her.  May have had complicated migraines in the past.  However states went to the [facility] office and had numbness and weakness on the left face with some twitching and weakness and numbness in the left arm and leg.  States last around 35 minutes.  Headache had resolved before the numbness developed.  No chest pain.  No trouble breathing.  Has had some numbness in the hands before with carpal tunnel but never numbness like this.    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   Heart murmur    Hiatal hernia 2002   EGD   Hypertension    Internal hemorrhoids    Migraines    when takes vitamins    Rectal polyp 01/24/2012   TUBULAR ADENOMA   Reflux esophagitis 2007   EGD   Stricture and stenosis of esophagus 2007   EGD   Tubular adenoma of colon    Ulcer    Vitamin D deficiency     Home Medications Prior to Admission medications   Medication Sig Start Date End Date Taking? Authorizing Provider  acetaminophen (TYLENOL) 500 MG tablet Take 250-500 mg by mouth every 6 (six) hours as needed for headache (pain).    [provider]  atenolol (TENORMIN) 25 MG tablet TAKE 1 TABLET BY MOUTH DAILY 11/13/21   Binnie Rail, MD  betamethasone valerate ointment (VALISONE) 0.1 % Apply a pea sized amount BID for up  to 2 weeks as needed 10/19/21   Salvadore Dom, MD  Calcium Carbonate-Vitamin D (CALTRATE 600+D PO) Take 1 tablet by mouth daily.     [provider]  cholecalciferol (VITAMIN D) 1000 units tablet Take 1,000 Units by mouth daily.    [provider]  donepezil (ARICEPT) 5 MG tablet Take 1 tablet (5 mg total) by mouth at bedtime. Patient not taking: Reported on 11/27/2021 08/26/20   Rondel Jumbo, PA-C  fexofenadine (ALLEGRA) 180 MG tablet Take 180 mg by mouth daily.    [provider]  hydrocortisone (ANUSOL-HC) 25 MG suppository Place 25 mg rectally as needed for hemorrhoids or anal itching.    [provider]  Lactase (LACTAID PO) Take 1 tablet by mouth 2 (two) times daily as needed (stomach upset/ gas).     [provider]  Multiple Vitamin (MULTIVITAMIN WITH MINERALS) TABS tablet Take 1 tablet by mouth daily.    [provider]  Na Sulfate-K Sulfate-Mg Sulf 17.5-3.13-1.6 GM/177ML SOLN Take 1 kit by mouth as directed. May use generic SUPREP;NO prior authorizations will be done.Please use Singlecare or GOOD-RX coupon. 11/27/21   Pyrtle, Lajuan Lines, MD  rosuvastatin ([place])  5 MG tablet TAKE 1 TABLET BY MOUTH 3 TIMES A WEEK 08/06/21   Burns, Claudina Lick, MD  telmisartan (MICARDIS) 80 MG tablet TAKE 1 TABLET BY MOUTH  DAILY 08/24/21   Binnie Rail, MD      Allergies    Cefprozil, Erythromycin, Esomeprazole magnesium, Penicillins, Sulfonamide derivatives, Amlodipine, Crestor [rosuvastatin calcium], Crestor [rosuvastatin], Erythromycin base, Hydrochlorothiazide, Pravastatin, Sulfa antibiotics, Zetia [ezetimibe], Atorvastatin, and Doxycycline    Review of Systems   Review of Systems  Neurological:  Positive for weakness and headaches.    Physical Exam Updated Vital Signs BP (!) 164/77 (BP Location: Right Arm)   Pulse 61   Temp 98.1 F (36.7 C) (Oral)   Resp 18   Ht 5' 3"  (1.6 m)   Wt 65.8 kg   SpO2 100%   BMI 25.69 kg/m  Physical  Exam Vitals and nursing note reviewed.  HENT:     Head: Normocephalic.  Cardiovascular:     Rate and Rhythm: Regular rhythm.  Pulmonary:     Effort: Pulmonary effort is normal.  Abdominal:     Tenderness: There is no abdominal tenderness. There is no guarding.  Skin:    General: Skin is warm.  Neurological:     Mental Status: She is alert and oriented to person, place, and time.     Comments: Eye movement intact with conjugate gaze.  Sensation intact bilaterally.  Equal smile.  Good grip strength bilaterally.  Sensation intact bilateral upper extremities.     ED Results / Procedures / Treatments   Labs (all labs ordered are listed, but only abnormal results are displayed) Labs Reviewed  CBC - Abnormal; Notable for the following components:      Result Value   RBC 5.57 (*)    HCT 46.7 (*)    All other components within normal limits  COMPREHENSIVE METABOLIC PANEL - Abnormal; Notable for the following components:   BUN 6 (*)    All other components within normal limits  URINALYSIS, ROUTINE W REFLEX MICROSCOPIC - Abnormal; Notable for the following components:   Ketones, ur 5 (*)    All other components within normal limits  I-STAT CHEM 8, ED - Abnormal; Notable for the following components:   Hemoglobin 15.6 (*)    All other components within normal limits  RESP PANEL BY RT-PCR (FLU A&B, COVID) ARPGX2  ETHANOL  PROTIME-INR  APTT  DIFFERENTIAL  RAPID URINE DRUG SCREEN, HOSP PERFORMED  TROPONIN I (HIGH SENSITIVITY)    EKG EKG Interpretation  Date/Time:  Tuesday December 08 2021 09:09:37 EDT Ventricular Rate:  57 PR Interval:  136 QRS Duration: 74 QT Interval:  458 QTC Calculation: 445 R Axis:   15 Text Interpretation: Sinus bradycardia Otherwise normal ECG No previous ECGs available Confirmed by Davonna Belling [phone]) on 12/08/2021 1:53:25 PM  Radiology CT HEAD WO CONTRAST  Result Date: 12/08/2021 CLINICAL DATA:  Transient ischemic attack (TIA) EXAM: CT HEAD  WITHOUT CONTRAST TECHNIQUE: Contiguous axial images were obtained from the base of the skull through the vertex without intravenous contrast. RADIATION DOSE REDUCTION: This exam was performed according to the departmental dose-optimization program which includes automated exposure control, adjustment of the mA and/or kV according to patient size and/or use of iterative reconstruction technique. COMPARISON:  07/25/2018 FINDINGS: Brain: No evidence of acute infarction, hemorrhage, hydrocephalus, extra-axial collection or mass lesion/mass effect. Scattered low-density changes within the periventricular and subcortical white matter compatible with chronic microvascular ischemic change. Vascular: No hyperdense vessel or unexpected calcification. Skull: Normal.  Negative for fracture or focal lesion. Sinuses/Orbits: No acute finding. Other: None. IMPRESSION: No acute intracranial findings. Electronically Signed   By: Davina Poke D.O.   On: 12/08/2021 09:46    Procedures Procedures    Medications Ordered in ED Medications - No data to display  ED Course/ Medical Decision Making/ A&P                           Medical Decision Making  Patient presents with focal neurodeficits.  Started with a headache but then that resolved.  Has had double vision with that in the past that also resolved.  Did have numbness weakness and reported twitching in the left face.  Also with symptoms of left upper and lower extremities.  Last around 35 minutes.  Differential diagnosis includes stroke, complicated migraine, TIA. Head CT done and reassuring.  No intracranial hemorrhage  Symptoms have resolved.  However with her ABCD2 score of 5 makes her moderate risk.  Discussed with Dr. Leonel Ramsay from neurology as a curbside consult.  Since the symptoms were unlike anything she has had before with the migraines but feels that this potentially could be a TIA.  Her risk factors are high enough she would require admission to the  hospital for further work-up.  Head CT reassuring.  Will discuss with hospitalist for admission. Not a tPA candidate since symptoms have improved.  EKG reassuring.  Lab work also reviewed and overall reassuring. I reviewed previous neurology note        Final Clinical Impression(s) / ED Diagnoses Final diagnoses:  TIA (transient ischemic attack)    Rx / DC Orders ED Discharge Orders     None         Davonna Belling, MD 12/08/21 1510

## 2021-12-08 NOTE — ED Triage Notes (Signed)
Pt BIB [facility] from the drs office. Pt was scheduled for a colonoscopy and once she arrived she had a sudden onset of a headache and weakness. EMS stated headache and weakness resolved once they arrived.

## 2021-12-09 ENCOUNTER — Observation Stay (HOSPITAL_BASED_OUTPATIENT_CLINIC_OR_DEPARTMENT_OTHER): Payer: Medicare Other

## 2021-12-09 DIAGNOSIS — G459 Transient cerebral ischemic attack, unspecified: Secondary | ICD-10-CM

## 2021-12-09 LAB — LIPID PANEL
Cholesterol: 208 mg/dL — ABNORMAL HIGH (ref 0–200)
HDL: 70 mg/dL (ref >40)
LDL Cholesterol: 111 mg/dL — ABNORMAL HIGH (ref 0–99)
Total CHOL/HDL Ratio: 3 RATIO
Triglycerides: 134 mg/dL (ref <150)
VLDL: 27 mg/dL (ref 0–40)

## 2021-12-09 LAB — ECHOCARDIOGRAM COMPLETE
AR max vel: 2.66 cm2
AV Peak grad: 5.3 mmHg
Ao pk vel: 1.15 m/s
Area-P 1/2: 3.63 cm2
Calc EF: 56 %
Height: 63 in
P 1/2 time: 441 msec
S' Lateral: 3.3 cm
Single Plane A2C EF: 55.8 %
Single Plane A4C EF: 56.3 %
Weight: 2320 oz

## 2021-12-09 LAB — VITAMIN B12: Vitamin B-12: 793 pg/mL (ref 180–914)

## 2021-12-09 LAB — HEMOGLOBIN A1C
Hgb A1c MFr Bld: 5.7 % — ABNORMAL HIGH (ref 4.8–5.6)
Mean Plasma Glucose: 116.89 mg/dL

## 2021-12-09 LAB — HIV ANTIBODY (ROUTINE TESTING W REFLEX): HIV Screen 4th Generation wRfx: NONREACTIVE

## 2021-12-09 LAB — TSH: TSH: 0.769 u[IU]/mL (ref 0.350–4.500)

## 2021-12-09 MED ORDER — ATENOLOL 50 MG PO TABS
25.0000 mg | ORAL_TABLET | Freq: Every day | ORAL | Status: DC
Start: 2021-12-09 — End: 2021-12-10
  Administered 2021-12-09 – 2021-12-10 (×2): 25 mg via ORAL
  Filled 2021-12-09 (×2): qty 1

## 2021-12-09 MED ORDER — ROSUVASTATIN CALCIUM 5 MG PO TABS: 5.0000 mg | ORAL_TABLET | Freq: Every day | ORAL | Status: DC

## 2021-12-09 MED ORDER — ASPIRIN 81 MG PO TBEC: 81.0000 mg | DELAYED_RELEASE_TABLET | Freq: Every day | ORAL | 12 refills | Status: AC

## 2021-12-09 NOTE — Progress Notes (Signed)
Carotid artery duplex has been completed. Preliminary results can be found in CV Proc through chart review.   12/09/21 4:58 PM Carlos Levering RVT

## 2021-12-09 NOTE — Progress Notes (Signed)
Pt states she only takes her crestor 3 times a week per her doctor due to her allergy reaction to crestor----itching.  Pt takes the med on FRIDAY, [place] NIGHTS she states.    Please advise and discuss with pt,   ordered daily here.   Did not give to pt the morning.

## 2021-12-09 NOTE — Progress Notes (Addendum)
PROGRESS NOTE    Karen Evans  BPZ:[id number] DOB: [birth year] DOA: 12/08/2021 PCP: Binnie Rail, MD  71/F with history of hypertension, dyslipidemia, colon polyps, mild memory loss presented to [facility] for colonoscopy yesterday, upon arriving in the colonoscopy suite she was noticed to have a left facial droop followed by left arm and left leg tingling, symptoms lasted around 30 minutes, blood pressure trended up to 242 systolic during this time.  Colonoscopy was canceled she was transferred to [facility] for TIA/stroke work-up   Subjective: Feels well, denies any complaints, is a poor historian  Assessment and Plan:  Transient left facial droop, left arm and leg tingling -Suspect complex migraine versus TIA, patient reports having a headache prior to the onset of symptoms -MRI/MRA-negative for acute stroke, no large vessel occlusions -Started on aspirin yesterday, will continue -Echo and carotid duplex pending -LDL is 111, continue Crestor, increase to daily, A1c is pending was 6.2, 4 months ago  Hypertension -Antihypertensives held yesterday for permissive hypertension, will restart atenolol today  Dyslipidemia -Continue Crestor, increase to daily from 3 days a week  Mild memory, cognitive deficits -Check B12 and TSH -OT eval, will need supervision, will request home health RN  DVT prophylaxis: Lovenox Code Status: Full code Family Communication: Discussed with patient in detail, no family at bedside Disposition Plan: Home pending TIA work-up, echo and carotid duplex  Consultants:    Procedures:   Antimicrobials:    Objective: Vitals:   12/08/21 2201 12/08/21 2233 12/09/21 0800 12/09/21 1249  BP:  (!) 176/75  (!) 132/96  Pulse:   62   Resp:  18    Temp: 98.2 F (36.8 C) 98.4 F (36.9 C) 98.3 F (36.8 C) 98.9 F (37.2 C)  TempSrc: Oral Oral Oral Oral  SpO2:  100%    Weight:      Height:       No intake or output data in the 24 hours ending 12/09/21  1311 Filed Weights   12/08/21 0925  Weight: 65.8 kg    Examination:  General exam: Appears calm and comfortable, AAO x2, mild cognitive deficits Respiratory system: Clear to auscultation Cardiovascular system: S1 & S2 heard, RRR.  Abd: nondistended, soft and nontender.Normal bowel sounds heard. Central nervous system: AAO x2, moves all extremities, no localizing signs, cranial nerves are intact Extremities: no edema Skin: No rashes Psychiatry:  Mood & affect appropriate.     Data Reviewed:   CBC: Recent Labs  Lab 12/08/21 0918 12/08/21 0930  WBC 6.2  --   NEUTROABS 4.3  --   HGB 14.5 15.6*  HCT 46.7* 46.0  MCV 83.8  --   PLT 218  --    Basic Metabolic Panel: Recent Labs  Lab 12/08/21 0918 12/08/21 0930  NA 139 138  K 3.9 4.0  CL 104 104  CO2 25  --   GLUCOSE 90 91  BUN 6* 11  CREATININE 0.83 0.80  CALCIUM 10.0  --    GFR: Estimated Creatinine Clearance: 60.6 mL/min (by C-G formula based on SCr of 0.8 mg/dL). Liver Function Tests: Recent Labs  Lab 12/08/21 0918  AST 18  ALT 18  ALKPHOS 75  BILITOT 0.7  PROT 7.3  ALBUMIN 3.9   No results for input(s): "LIPASE", "AMYLASE" in the last 168 hours. No results for input(s): "AMMONIA" in the last 168 hours. Coagulation Profile: Recent Labs  Lab 12/08/21 0918  INR 1.0   Cardiac Enzymes: No results for input(s): "CKTOTAL", "CKMB", "CKMBINDEX", "TROPONINI"  in the last 168 hours. BNP (last 3 results) No results for input(s): "PROBNP" in the last 8760 hours. HbA1C: No results for input(s): "HGBA1C" in the last 72 hours. CBG: No results for input(s): "GLUCAP" in the last 168 hours. Lipid Profile: Recent Labs    12/09/21 0320  CHOL 208*  HDL 70  LDLCALC 111*  TRIG 134  CHOLHDL 3.0   Thyroid Function Tests: No results for input(s): "TSH", "T4TOTAL", "FREET4", "T3FREE", "THYROIDAB" in the last 72 hours. Anemia Panel: No results for input(s): "VITAMINB12", "FOLATE", "FERRITIN", "TIBC", "IRON",  "RETICCTPCT" in the last 72 hours. Urine analysis:    Component Value Date/Time   COLORURINE YELLOW 12/08/2021 [place] 12/08/2021 1049   LABSPEC 1.012 12/08/2021 1049   PHURINE 5.0 12/08/2021 1049   GLUCOSEU NEGATIVE 12/08/2021 1049   GLUCOSEU NEGATIVE 02/25/2016 1141   HGBUR NEGATIVE 12/08/2021 1049   BILIRUBINUR NEGATIVE 12/08/2021 1049   BILIRUBINUR Neg 03/02/2013 1500   KETONESUR 5 (A) 12/08/2021 1049   PROTEINUR NEGATIVE 12/08/2021 1049   UROBILINOGEN 0.2 02/25/2016 1141   NITRITE NEGATIVE 12/08/2021 1049   LEUKOCYTESUR NEGATIVE 12/08/2021 1049   Sepsis Labs: '@LABRCNTIP'$ (procalcitonin:4,lacticidven:4)  ) Recent Results (from the past 240 hour(s))  Resp Panel by RT-PCR (Flu A&B, Covid) Anterior Nasal Swab     Status: None   Collection Time: 12/08/21  8:18 PM   Specimen: Anterior Nasal Swab  Result Value Ref Range Status   SARS Coronavirus 2 by RT PCR NEGATIVE NEGATIVE Final    Comment: (NOTE) SARS-CoV-2 target nucleic acids are NOT DETECTED.  The SARS-CoV-2 RNA is generally detectable in upper respiratory specimens during the acute phase of infection. The lowest concentration of SARS-CoV-2 viral copies this assay can detect is 138 copies/mL. A negative result does not preclude SARS-Cov-2 infection and should not be used as the sole basis for treatment or other patient management decisions. A negative result may occur with  improper specimen collection/handling, submission of specimen other than nasopharyngeal swab, presence of viral mutation(s) within the areas targeted by this assay, and inadequate number of viral copies(<138 copies/mL). A negative result must be combined with clinical observations, patient history, and epidemiological information. The expected result is Negative.  Fact Sheet for Patients:  EntrepreneurPulse.com.au  Fact Sheet for Healthcare Providers:  IncredibleEmployment.be  This test is no  t yet approved or cleared by the [place] FDA and  has been authorized for detection and/or diagnosis of SARS-CoV-2 by FDA under an Emergency Use Authorization (EUA). This EUA will remain  in effect (meaning this test can be used) for the duration of the COVID-19 declaration under Section 564(b)(1) of the Act, 21 U.S.C.section 360bbb-3(b)(1), unless the authorization is terminated  or revoked sooner.       Influenza A by PCR NEGATIVE NEGATIVE Final   Influenza B by PCR NEGATIVE NEGATIVE Final    Comment: (NOTE) The Xpert Xpress SARS-CoV-2/FLU/RSV plus assay is intended as an aid in the diagnosis of influenza from Nasopharyngeal swab specimens and should not be used as a sole basis for treatment. Nasal washings and aspirates are unacceptable for Xpert Xpress SARS-CoV-2/FLU/RSV testing.  Fact Sheet for Patients: EntrepreneurPulse.com.au  Fact Sheet for Healthcare Providers: IncredibleEmployment.be  This test is not yet approved or cleared by the [place] FDA and has been authorized for detection and/or diagnosis of SARS-CoV-2 by FDA under an Emergency Use Authorization (EUA). This EUA will remain in effect (meaning this test can be used) for the duration of the COVID-19 declaration under  Section 564(b)(1) of the Act, 21 U.S.C. section 360bbb-3(b)(1), unless the authorization is terminated or revoked.  Performed at [facility], [place] [address] [place], [place] [address]      Radiology Studies: MR BRAIN WO CONTRAST  Result Date: 12/08/2021 CLINICAL DATA:  TIA EXAM: MRI HEAD WITHOUT CONTRAST MRA HEAD WITHOUT CONTRAST TECHNIQUE: Multiplanar, multi-echo pulse sequences of the brain and surrounding structures were acquired without intravenous contrast. Angiographic images of the Circle of Willis were acquired using MRA technique without intravenous contrast. COMPARISON:  MRI head 07/25/2018; no prior MRA; correlation is also made  with CT head 12/08/2021 FINDINGS: MRI HEAD FINDINGS Brain: No restricted diffusion to suggest acute or subacute infarct. No acute hemorrhage, mass, mass effect, or midline shift. No hemosiderin deposition to suggest remote hemorrhage. No hydrocephalus or extra-axial collection. Normal pituitary and craniocervical junction. T2 hyperintense signal in the periventricular white matter, likely the sequela of moderate chronic small vessel ischemic disease. Vascular: Please see MRA findings below. Skull and upper cervical spine: Normal marrow signal. Sinuses/Orbits: Mucous retention cysts in the right maxillary sinus. The orbits are unremarkable. Other: The mastoids are well aerated. MRA HEAD FINDINGS Anterior circulation: Both internal carotid arteries are patent to the termini, without significant stenosis. A1 segments patent, diminutive on the left normal anterior communicating artery. Anterior cerebral arteries are patent to their distal aspects. No M1 stenosis or occlusion. Normal MCA bifurcations. Distal MCA branches perfused and symmetric. Posterior circulation: Vertebral arteries patent to the vertebrobasilar junction without stenosis. Posterior inferior cerebral artery patent on the left. Prominent right AICA. Basilar patent to its distal aspect. Superior cerebellar arteries patent bilaterally. Patent P1 segments. PCAs perfused to their distal aspects without stenosis. The bilateral posterior communicating arteries are not visualized. Anatomic variants: None significant IMPRESSION: 1. No acute intracranial process. No evidence of acute or subacute infarct. 2.  No intracranial large vessel occlusion or significant stenosis. Electronically Signed   By: Merilyn Baba M.D.   On: 12/08/2021 19:21   MR ANGIO HEAD WO CONTRAST  Result Date: 12/08/2021 CLINICAL DATA:  TIA EXAM: MRI HEAD WITHOUT CONTRAST MRA HEAD WITHOUT CONTRAST TECHNIQUE: Multiplanar, multi-echo pulse sequences of the brain and surrounding structures  were acquired without intravenous contrast. Angiographic images of the Circle of Willis were acquired using MRA technique without intravenous contrast. COMPARISON:  MRI head 07/25/2018; no prior MRA; correlation is also made with CT head 12/08/2021 FINDINGS: MRI HEAD FINDINGS Brain: No restricted diffusion to suggest acute or subacute infarct. No acute hemorrhage, mass, mass effect, or midline shift. No hemosiderin deposition to suggest remote hemorrhage. No hydrocephalus or extra-axial collection. Normal pituitary and craniocervical junction. T2 hyperintense signal in the periventricular white matter, likely the sequela of moderate chronic small vessel ischemic disease. Vascular: Please see MRA findings below. Skull and upper cervical spine: Normal marrow signal. Sinuses/Orbits: Mucous retention cysts in the right maxillary sinus. The orbits are unremarkable. Other: The mastoids are well aerated. MRA HEAD FINDINGS Anterior circulation: Both internal carotid arteries are patent to the termini, without significant stenosis. A1 segments patent, diminutive on the left normal anterior communicating artery. Anterior cerebral arteries are patent to their distal aspects. No M1 stenosis or occlusion. Normal MCA bifurcations. Distal MCA branches perfused and symmetric. Posterior circulation: Vertebral arteries patent to the vertebrobasilar junction without stenosis. Posterior inferior cerebral artery patent on the left. Prominent right AICA. Basilar patent to its distal aspect. Superior cerebellar arteries patent bilaterally. Patent P1 segments. PCAs perfused to their distal aspects without stenosis. The  bilateral posterior communicating arteries are not visualized. Anatomic variants: None significant IMPRESSION: 1. No acute intracranial process. No evidence of acute or subacute infarct. 2.  No intracranial large vessel occlusion or significant stenosis. Electronically Signed   By: Merilyn Baba M.D.   On: 12/08/2021 19:21    CT HEAD WO CONTRAST  Result Date: 12/08/2021 CLINICAL DATA:  Transient ischemic attack (TIA) EXAM: CT HEAD WITHOUT CONTRAST TECHNIQUE: Contiguous axial images were obtained from the base of the skull through the vertex without intravenous contrast. RADIATION DOSE REDUCTION: This exam was performed according to the departmental dose-optimization program which includes automated exposure control, adjustment of the mA and/or kV according to patient size and/or use of iterative reconstruction technique. COMPARISON:  07/25/2018 FINDINGS: Brain: No evidence of acute infarction, hemorrhage, hydrocephalus, extra-axial collection or mass lesion/mass effect. Scattered low-density changes within the periventricular and subcortical white matter compatible with chronic microvascular ischemic change. Vascular: No hyperdense vessel or unexpected calcification. Skull: Normal. Negative for fracture or focal lesion. Sinuses/Orbits: No acute finding. Other: None. IMPRESSION: No acute intracranial findings. Electronically Signed   By: Davina Poke D.O.   On: 12/08/2021 09:46     Scheduled Meds:  aspirin  300 mg Rectal Daily   Or   aspirin  325 mg Oral Daily   enoxaparin (LOVENOX) injection  40 mg Subcutaneous Q24H   rosuvastatin  5 mg Oral Daily   Continuous Infusions:   LOS: 0 days    Time spent: 61mn    PDomenic Polite MD [facility]   12/09/2021, 1:11 PM

## 2021-12-09 NOTE — Progress Notes (Signed)
  Transition of Care (TOC) Screening Note   Patient Details  Name: Karen Evans Date of Birth: March 08, 1951   Transition of Care [facility]) CM/SW Contact:    Cyndi Bender, RN Phone Number: 12/09/2021, 9:01 AM    Transition of Care Department [facility]) has reviewed patient and no TOC needs have been identified at this time. We will continue to monitor patient advancement through interdisciplinary progression rounds. If new patient transition needs arise, please place a TOC consult.

## 2021-12-09 NOTE — Evaluation (Signed)
Occupational Therapy Evaluation Patient Details Name: Karen Evans MRN: [id number] DOB: [birth year] Today's Date: 12/09/2021   History of Present Illness Pt is a 71 year old woman admitted on 12/08/21 prior to scheduled colonoscopy with L facial droop and L side weakness and numbness. Brain MRI negative for acute changes. PMH: colon polyps, HLD, HTN, atypical migraine.   Clinical Impression   Pt supervised for lines with mobility, did not demonstrate any LOB. She is functioning independently in basic ADLs. Pt demonstrates impairment in cognition, specifically attention, memory and problem solving. Recommending HHOT to address IADLs and safety in her home. Pt has minimal support and lives alone. Will follow acutely.      Recommendations for follow up therapy are one component of a multi-disciplinary discharge planning process, led by the attending physician.  Recommendations may be updated based on patient status, additional functional criteria and insurance authorization.   Follow Up Recommendations  Home health OT    Assistance Recommended at Discharge PRN  Patient can return home with the following Direct supervision/assist for medications management;Direct supervision/assist for financial management    Functional Status Assessment  Patient has had a recent decline in their functional status and demonstrates the ability to make significant improvements in function in a reasonable and predictable amount of time.  Equipment Recommendations  None recommended by OT    Recommendations for Other Services       Precautions / Restrictions Precautions Precautions: None Precaution Comments: pt does not attend to lines      Mobility Bed Mobility Overal bed mobility: Modified Independent             General bed mobility comments: HOB up    Transfers Overall transfer level: Modified independent Equipment used: None                      Balance Overall balance  assessment: Needs assistance   Sitting balance-Leahy Scale: Normal       Standing balance-Leahy Scale: Good                             ADL either performed or assessed with clinical judgement   ADL Overall ADL's : Independent                         Toilet Transfer: Independent   Toileting- Clothing Manipulation and Hygiene: Independent       Functional mobility during ADLs: Supervision/safety (supervised for lines/safety) General ADL Comments: Pt is doing well with ADLs, but concerns with her ability to manage IADLs due to cognitive impairment.     Vision Baseline Vision/History: 1 Wears glasses Ability to See in Adequate Light: 0 Adequate Patient Visual Report: No change from baseline       Perception     Praxis      Pertinent Vitals/Pain Pain Assessment Pain Assessment: No/denies pain     Hand Dominance Right   Extremity/Trunk Assessment Upper Extremity Assessment Upper Extremity Assessment: Overall WFL for tasks assessed   Lower Extremity Assessment Lower Extremity Assessment: Defer to PT evaluation   Cervical / Trunk Assessment Cervical / Trunk Assessment: Normal   Communication Communication Communication: No difficulties   Cognition Arousal/Alertness: Awake/alert Behavior During Therapy: WFL for tasks assessed/performed Overall Cognitive Status: Impaired/Different from baseline Area of Impairment: Attention, Memory, Safety/judgement, Problem solving, Following commands  Current Attention Level: Sustained Memory: Decreased short-term memory (reports baseline memory deficits) Following Commands: Follows one step commands with increased time Safety/Judgement: Decreased awareness of deficits   Problem Solving: Slow processing General Comments: pt very good at recalling names of staff, gets tired with a lot of questions, may be baseline     General Comments       Exercises     Shoulder  Instructions      Home Living Family/patient expects to be discharged to:: Private residence Living Arrangements: Alone Available Help at Discharge: Available PRN/intermittently;Friend(s) Type of Home: House Home Access: Level entry     Home Layout: One level     Bathroom Shower/Tub: Occupational psychologist: Handicapped height     Home Equipment: Shower seat   Additional Comments: pt is a difficult interview  Lives With: Alone    Prior Functioning/Environment Prior Level of Function : Independent/Modified Independent;Driving                        OT Problem List: Decreased cognition      OT Treatment/Interventions: Cognitive remediation/compensation    OT Goals(Current goals can be found in the care plan section) Acute Rehab OT Goals OT Goal Formulation: With patient Time For Goal Achievement: 12/23/21 Potential to Achieve Goals: Good ADL Goals Additional ADL Goal #1: Pt will participate in formal cognitive assessment using pill box test. Additional ADL Goal #2: Pt will be aware of cognitive deficits and compensatory strategies.  OT Frequency: Min 2X/week    Co-evaluation              AM-PAC OT "6 Clicks" Daily Activity     Outcome Measure Help from another person eating meals?: None Help from another person taking care of personal grooming?: None Help from another person toileting, which includes using toliet, bedpan, or urinal?: None Help from another person bathing (including washing, rinsing, drying)?: None Help from another person to put on and taking off regular upper body clothing?: None Help from another person to put on and taking off regular lower body clothing?: None 6 Click Score: 24   End of Session    Activity Tolerance: Patient tolerated treatment well Patient left: in chair;with call bell/phone within reach;with chair alarm set  OT Visit Diagnosis: Other symptoms and signs involving cognitive function                 Time: 7673-4193 OT Time Calculation (min): 38 min Charges:  OT General Charges $OT Visit: 1 Visit OT Evaluation $OT Eval Low Complexity: 1 Low OT Treatments $Self Care/Home Management : 8-22 mins  Cleta Alberts, OTR/L Acute Rehabilitation Services Office: [phone]  Malka So 12/09/2021, 10:02 AM

## 2021-12-09 NOTE — TOC Initial Note (Signed)
Transition of Care [facility]) - Initial/Assessment Note    Patient Details  Name: Karen Evans MRN: [id number] Date of Birth: 1950/10/19  Transition of Care [facility] CM/SW Contact:    Cyndi Bender, RN Phone Number: 12/09/2021, 4:03 PM  Clinical Narrative:                  Spoke to patient regarding transition needs. Orders for home health. Offered Choice. Patient deferred to [facility] to find highly rated agency. Anderson Malta with [facility] accepted referral. Friend at bedside to transport home if discharged today.  TOC will continue to follow for needs.   Barriers to Discharge: Barriers Resolved   Patient Goals and CMS Choice Patient states their goals for this hospitalization and ongoing recovery are:: return home CMS Medicare.gov Compare Post Acute Care list provided to:: Patient Choice offered to / list presented to : Patient  Expected Discharge Plan and Services                                     HH Arranged: RN, PT, OT, Speech Therapy HH Agency: [facility] Date [facility] Agency Contacted: 12/09/21 Time [place]: 1603 Representative spoke with at [place]: Anderson Malta  Prior Living Arrangements/Services                       Activities of Daily Living Home Assistive Devices/Equipment: None ADL Screening (condition at time of admission) Patient's cognitive ability adequate to safely complete daily activities?: Yes Is the patient deaf or have difficulty hearing?: Yes Does the patient have difficulty seeing, even when wearing glasses/contacts?: No Does the patient have difficulty concentrating, remembering, or making decisions?: No Patient able to express need for assistance with ADLs?: Yes Does the patient have difficulty dressing or bathing?: No Independently performs ADLs?: Yes (appropriate for developmental age) Communication: Independent Dressing (OT): Independent Grooming: Independent Feeding: Independent Bathing: Independent Toileting:  Independent In/Out Bed: Independent Walks in Home: Independent Does the patient have difficulty walking or climbing stairs?: No Weakness of Legs: None Weakness of Arms/Hands: None  Permission Sought/Granted                  Emotional Assessment              Admission diagnosis:  TIA (transient ischemic attack) [G45.9] Patient Active Problem List   Diagnosis Date Noted   TIA (transient ischemic attack) 12/08/2021   Urticaria 03/09/2021   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 - [address] [place] [address] Phone: [phone] Fax: [phone]  [facility] ([place]) - [place], [place] [facility] [place] [address] [address] Phone: [phone] Fax: [phone]  [facility] ([facility] ) - [place], [place]  [address] [place] [address] Phone: [phone] Fax: [phone]     Social Determinants of Health (SDOH) Interventions    Readmission Risk Interventions     No data to display

## 2021-12-09 NOTE — Progress Notes (Signed)
Physical Therapy Evaluation Patient Details Name: Karen Evans MRN: [id number] DOB: 1951/03/29 Today's Date: 12/09/2021  History of Present Illness  Pt is a 71 year old woman admitted on 12/08/21 prior to scheduled colonoscopy with L facial droop and L side weakness and numbness. Brain MRI negative for acute changes. PMH: colon polyps, HLD, HTN, atypical migraine.  Clinical Impression  Pt was seen for progression of mobility from chair to walk, and then work on balance skills. Pt is losing balance with high level skills, resulting in observation that pt cannot steady herself for narrow BOS nor can she self monitor safety with close obstacles.  Pt is managing her balance with slow pace and reaching out, and may benefit from a rollator vs RW.  Follow along with her to determine if AD needed, to work on safety of gait in room and in less confined spaces, and ask for HHPT for follow up of all needs.  Pt is motivated to go home but with NO FAMILY IDENTIFIED to assist, may also need CNA for assisting her home needs.  See acutely for goals of PT as are outlined in POC.     Recommendations for follow up therapy are one component of a multi-disciplinary discharge planning process, led by the attending physician.  Recommendations may be updated based on patient status, additional functional criteria and insurance authorization.  Follow Up Recommendations Home health PT      Assistance Recommended at Discharge Intermittent Supervision/Assistance  Patient can return home with the following  A little help with walking and/or transfers;A little help with bathing/dressing/bathroom;Assistance with cooking/housework;Assist for transportation;Help with stairs or ramp for entrance    Equipment Recommendations None recommended by PT (Assessing her need currently)  Recommendations for Other Services       Functional Status Assessment Patient has had a recent decline in their functional status and demonstrates  the ability to make significant improvements in function in a reasonable and predictable amount of time.     Precautions / Restrictions Precautions Precautions: Fall Precaution Comments: monitor lines Restrictions Weight Bearing Restrictions: No      Mobility  Bed Mobility               General bed mobility comments: up in chair    Transfers Overall transfer level: Needs assistance Equipment used: None Transfers: Sit to/from Stand Sit to Stand: Supervision (for lines and safety)                Ambulation/Gait Ambulation/Gait assistance: Min guard Gait Distance (Feet): 45 Feet Assistive device: 1 person hand held assist Gait Pattern/deviations: Step-to pattern, Step-through pattern, Decreased stride length, Wide base of support (wide turns) Gait velocity: reduced Gait velocity interpretation: <1.31 ft/sec, indicative of household ambulator   General Gait Details: min guard with extra time to maneuver with turns, to get around obstacles  Stairs            Wheelchair Mobility    Modified Rankin (Stroke Patients Only)       Balance Overall balance assessment: Needs assistance   Sitting balance-Leahy Scale: Good       Standing balance-Leahy Scale: Good Standing balance comment: dynamic balance fair                             Pertinent Vitals/Pain Pain Assessment Pain Assessment: No/denies pain    Home Living Family/patient expects to be discharged to:: Private residence Living Arrangements: Alone Available Help at  Discharge: Available PRN/intermittently;Friend(s) Type of Home: House Home Access: Level entry       Home Layout: One level Home Equipment: Shower seat      Prior Function Prior Level of Function : Independent/Modified Independent;Driving             Mobility Comments: no recent falls       Hand Dominance   Dominant Hand: Right    Extremity/Trunk Assessment   Upper Extremity Assessment Upper  Extremity Assessment: Defer to OT evaluation    Lower Extremity Assessment Lower Extremity Assessment: Overall WFL for tasks assessed    Cervical / Trunk Assessment Cervical / Trunk Assessment: Normal  Communication   Communication: No difficulties  Cognition Arousal/Alertness: Awake/alert Behavior During Therapy: WFL for tasks assessed/performed Overall Cognitive Status: No family/caregiver present to determine baseline cognitive functioning Area of Impairment: Attention, Memory, Safety/judgement, Awareness, Problem solving                   Current Attention Level: Selective, Sustained Memory: Decreased short-term memory, Decreased recall of precautions Following Commands: Follows one step commands with increased time Safety/Judgement: Decreased awareness of deficits, Decreased awareness of safety Awareness: Intellectual Problem Solving: Slow processing General Comments: Pt reports no family can be helpful        General Comments General comments (skin integrity, edema, etc.): Pt demonstrates LOB with high level balance skills, extra time for turning and supervision to reach to the floor, cannot do tandem and cannot stand on one foot more than 3 seconds    Exercises     Assessment/Plan    PT Assessment Patient needs continued PT services  PT Problem List Decreased strength;Decreased balance;Decreased coordination;Decreased safety awareness;Decreased mobility;Decreased cognition       PT Treatment Interventions DME instruction;Gait training;Functional mobility training;Therapeutic activities;Balance training;Therapeutic exercise;Neuromuscular re-education;Patient/family education    PT Goals (Current goals can be found in the Care Plan section)  Acute Rehab PT Goals Patient Stated Goal: to get home again PT Goal Formulation: With patient Time For Goal Achievement: 12/23/21 Potential to Achieve Goals: Good    Frequency Min 4X/week     Co-evaluation                AM-PAC PT "6 Clicks" Mobility  Outcome Measure Help needed turning from your back to your side while in a flat bed without using bedrails?: None Help needed moving from lying on your back to sitting on the side of a flat bed without using bedrails?: A Little Help needed moving to and from a bed to a chair (including a wheelchair)?: A Little Help needed standing up from a chair using your arms (e.g., wheelchair or bedside chair)?: A Little Help needed to walk in hospital room?: A Little Help needed climbing 3-5 steps with a railing? : A Lot 6 Click Score: 18    End of Session   Activity Tolerance: Patient tolerated treatment well;Other (comment) (weakness generally) Patient left: in chair;with call bell/phone within reach;with chair alarm set;with family/visitor present Nurse Communication: Mobility status PT Visit Diagnosis: Unsteadiness on feet (R26.81);Difficulty in walking, not elsewhere classified (R26.2)    Time: 9741-6384 PT Time Calculation (min) (ACUTE ONLY): 27 min   Charges:   PT Evaluation $PT Eval Moderate Complexity: 1 Mod PT Treatments $Neuromuscular Re-education: 8-22 mins       Ramond Dial 12/09/2021, 2:58 PM  Mee Hives, PT PhD Acute Rehab Dept. Number: [place]

## 2021-12-09 NOTE — Evaluation (Signed)
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   Vitamin D deficiency 03/26/2010   IRRITABLE BOWEL SYNDROME 04/25/2008   HIATAL HERNIA 04/24/2008   RHINITIS 03/14/2008   History of uterine fibroid 12/29/2007   Goiter 12/29/2007   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   Essential hypertension 12/29/2007   Past Medical History:  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   Heart murmur    Hiatal hernia 2002   EGD   Hypertension    Internal hemorrhoids    Migraines    when takes vitamins    Rectal polyp 01/24/2012   TUBULAR ADENOMA   Reflux esophagitis 2007   EGD   Stricture and stenosis of esophagus 2007   EGD   Tubular adenoma of colon    Ulcer    Vitamin D deficiency    Past Surgical History:  Past Surgical History:  Procedure Laterality Date   COLONOSCOPY     polyps;granular ileitis 2006;2007 normal, Dr Sharlett Iles   COLONOSCOPY WITH PROPOFOL  07/2016   Dr.Pyrtle   HEMORRHOID SURGERY     OPERATIVE HYSTEROSCOPY  08/05/2004   Dr Garwin Brothers   OVARIAN CYST REMOVAL     POLYPECTOMY     UPPER GASTROINTESTINAL ENDOSCOPY      HPI:  Karen Evans is a 71 y.o. female with medical history significant of colon polyps, HLD, and HTN presenting with stroke like symptoms that she reported when she went for colonoscopy (L facial droop following by L arm and leg tingling/numbness.  Per chart symptoms lasted about 35 minutes total and resolved completely). Per MD she does exhibit mildly stuttering speech at this time   Assessment / Plan / Recommendation Clinical Impression  Pt's baseline cognitive status is unknown to this examiner. Pt states she has trouble with her memory at times, has forgotten appointments, lives alone and is responsible for finances and medical management. She was unable to state "911" when given hypothetical situation. Scored 21/30 on SLUMS with impairments in memory, attention, mental math. She did recall her nurse and tech's name from memory. Therapist educated pt re: strategies to assist with cognitive organization. She would benefit from [facility] while here in acute care and home health at discharge.    SLP Assessment  SLP Recommendation/Assessment: Patient needs continued Speech [place] Pathology Services SLP Visit Diagnosis: Cognitive communication deficit (R41.841)    Recommendations for follow up therapy are one component of a multi-disciplinary discharge planning process, led by the attending physician.  Recommendations may be updated based on patient status, additional functional criteria and insurance authorization.    Follow Up  Recommendations  Home health SLP    Assistance Recommended at Discharge     Functional Status Assessment Patient has had a recent decline in their functional status and demonstrates the ability to make significant improvements in function in a reasonable and predictable amount of time.  Frequency and Duration min 1 x/week  1 week      SLP Evaluation Cognition  Overall Cognitive Status: No family/caregiver present to determine baseline cognitive functioning  (suspect some degree of cognitive deficits may be baseline) Arousal/Alertness: Awake/alert Orientation Level: Oriented X4 Year: 2023 Day of Week: Correct Attention: Sustained Sustained Attention: Appears intact Memory: Impaired Memory Impairment: Retrieval deficit Awareness: Impaired Awareness Impairment: Anticipatory impairment Problem Solving: Impaired Problem Solving Impairment: Verbal basic Safety/Judgment: Impaired       Comprehension  Auditory Comprehension Overall Auditory Comprehension: Appears within functional limits for tasks assessed Reading Comprehension Reading Status: Not tested    Expression Expression Primary Mode of Expression: Verbal Verbal Expression Overall Verbal Expression: Appears within functional limits for tasks assessed Repetition:  (NT) Naming: No impairment Pragmatics: No impairment Written Expression Dominant Hand: Right Written Expression: Not tested   Oral / Motor  Oral Motor/Sensory Function Overall Oral Motor/Sensory Function: Within functional limits Motor Speech Overall Motor Speech: Appears within functional limits for tasks assessed Motor Planning: Witnin functional limits            Houston Siren 12/09/2021, 9:26 AM

## 2021-12-10 ENCOUNTER — Telehealth: Payer: Self-pay | Admitting: Internal Medicine

## 2021-12-10 DIAGNOSIS — G459 Transient cerebral ischemic attack, unspecified: Secondary | ICD-10-CM | POA: Diagnosis not present

## 2021-12-10 NOTE — Discharge Summary (Signed)
Physician Discharge Summary  Karen Evans HUD:[id number] DOB: 11/02/1950 DOA: 12/08/2021  PCP: Binnie Rail, MD  Admit date: 12/08/2021 Discharge date: 12/10/2021  Time spent: 35 minutes  Recommendations for Outpatient Follow-up:  PCP Dr. Quay Burow in 1 week, patient has cognitive deficits Home health PT OT and RN   Discharge Diagnoses:  Principal Problem: TIA versus complex migraine Mild cognitive deficits   Essential hypertension   Hyperlipidemia   Prediabetes   Discharge Condition: Stable  Diet recommendation: Low-sodium, diabetic  Filed Weights   12/08/21 0925  Weight: 65.8 kg    History of present illness:  71/F with history of hypertension, dyslipidemia, colon polyps, mild memory loss presented to [facility] for colonoscopy yesterday, upon arriving in the colonoscopy suite she was noticed to have a left facial droop followed by left arm and left leg tingling, symptoms lasted around 30 minutes, blood pressure trended up to 858 systolic during this time.  Colonoscopy was canceled she was transferred to [facility] for TIA/stroke work-up    Hospital Course:   Transient left facial droop, left arm and leg tingling -Suspect complex migraine versus TIA, patient reports having a headache prior to the onset of symptoms -MRI/MRA-negative for acute stroke, no large vessel occlusions -Started on aspirin yesterday, will continue 81 Mg daily at baseline -Echo-with preserved EF, no cardiac source of embolus and carotid duplex unremarkable -LDL is 111, continue Crestor, increase to daily, A1c is pending was 6.2, 4 months ago -PT OT eval completed, home health services recommended -Follow-up with PCP in 2 weeks   Hypertension -antihypertensives held yesterday for permissive hypertension, stroke work-up negative, restarted atenolol and Micardis   Dyslipidemia -Continue Crestor, increase to daily from 3 days a week   Mild memory, cognitive deficits -B12 and TSH were normal -OT eval,  completed, home health OT recommended - will need intermittent supervision, will request home health RN  Discharge Exam: Vitals:   12/10/21 0426 12/10/21 0830  BP: 124/87 114/89  Pulse: 60 63  Resp: 19 15  Temp: 97.9 F (36.6 C)   SpO2: 97% 99%   General exam: Appears calm and comfortable, AAO x2, mild cognitive deficits Respiratory system: Clear to auscultation Cardiovascular system: S1 & S2 heard, RRR.  Abd: nondistended, soft and nontender.Normal bowel sounds heard. Central nervous system: AAO x2, moves all extremities, no localizing signs, cranial nerves are intact Extremities: no edema Skin: No rashes Psychiatry:  Mood & affect appropriate.   Discharge Instructions   Discharge Instructions     Diet - low sodium heart healthy   Complete by: As directed    Diet - low sodium heart healthy   Complete by: As directed    Increase activity slowly   Complete by: As directed    Increase activity slowly   Complete by: As directed       Allergies as of 12/10/2021       Reactions   Cefprozil Hives   Erythromycin Hives   Esomeprazole Magnesium Hives   Penicillins Hives, Rash   Has patient had a PCN reaction causing immediate rash, facial/tongue/throat swelling, SOB or lightheadedness with hypotension: Yes Has patient had a PCN reaction causing severe rash involving mucus membranes or skin necrosis: No Has patient had a PCN reaction that required hospitalization: No Has patient had a PCN reaction occurring within the last 10 years: No If all of the above answers are "NO", then may proceed with Cephalosporin use.   Sulfonamide Derivatives Hives   Amlodipine Other (See Comments)  gerd   Crestor [rosuvastatin Calcium] Itching   Crestor [rosuvastatin] Itching, Other (See Comments)   Lightheadedness, prickly all over   Erythromycin Base Hives   Hydrochlorothiazide Other (See Comments)   lightheadedness   Pravastatin Other (See Comments)   Dizzy, muscle soreness   Sulfa  Antibiotics Hives   Atorvastatin Other (See Comments)   Eyes itch   Doxycycline Nausea And Vomiting   Zetia [ezetimibe] Rash   Caused drowsiness        Medication List     TAKE these medications    acetaminophen 500 MG tablet Commonly known as: TYLENOL Take 250-500 mg by mouth every 6 (six) hours as needed for headache (pain).   aspirin EC 81 MG tablet Take 1 tablet (81 mg total) by mouth daily. Swallow whole.   atenolol 25 MG tablet Commonly known as: TENORMIN TAKE 1 TABLET BY MOUTH DAILY   betamethasone valerate ointment 0.1 % Commonly known as: VALISONE Apply a pea sized amount BID for up to 2 weeks as needed What changed:  how much to take how to take this when to take this reasons to take this additional instructions   CALTRATE 600+D PO Take 1 tablet by mouth daily.   cholecalciferol 1000 units tablet Commonly known as: VITAMIN D Take 1,000 Units by mouth daily.   fexofenadine 180 MG tablet Commonly known as: ALLEGRA Take 180 mg by mouth daily.   FLONASE NA Place 1 spray into the nose daily as needed (congestion).   hydrocortisone 25 MG suppository Commonly known as: ANUSOL-HC Place 25 mg rectally as needed for hemorrhoids or anal itching.   LACTAID PO Take 1 tablet by mouth 2 (two) times daily as needed (stomach upset/ gas).   multivitamin with minerals Tabs tablet Take 1 tablet by mouth daily.   Na Sulfate-K Sulfate-Mg Sulf 17.5-3.13-1.6 GM/177ML Soln Take 1 kit by mouth as directed. May use generic SUPREP;NO prior authorizations will be done.Please use Singlecare or GOOD-RX coupon.   rosuvastatin 5 MG tablet Commonly known as: CRESTOR Take 1 tablet (5 mg total) by mouth daily. What changed:  how much to take how to take this when to take this additional instructions   SYSTANE COMPLETE OP Place 1 drop into both eyes at bedtime.   telmisartan 80 MG tablet Commonly known as: MICARDIS TAKE 1 TABLET BY MOUTH  DAILY       Allergies   Allergen Reactions   Cefprozil Hives   Erythromycin Hives   Esomeprazole Magnesium Hives   Penicillins Hives and Rash    Has patient had a PCN reaction causing immediate rash, facial/tongue/throat swelling, SOB or lightheadedness with hypotension: Yes Has patient had a PCN reaction causing severe rash involving mucus membranes or skin necrosis: No Has patient had a PCN reaction that required hospitalization: No Has patient had a PCN reaction occurring within the last 10 years: No If all of the above answers are "NO", then may proceed with Cephalosporin use.   Sulfonamide Derivatives Hives   Amlodipine Other (See Comments)    gerd   Crestor [Rosuvastatin Calcium] Itching   Crestor [Rosuvastatin] Itching and Other (See Comments)    Lightheadedness, prickly all over   Erythromycin Base Hives   Hydrochlorothiazide Other (See Comments)    lightheadedness   Pravastatin Other (See Comments)    Dizzy, muscle soreness   Sulfa Antibiotics Hives   Atorvastatin Other (See Comments)    Eyes itch   Doxycycline Nausea And Vomiting   Zetia [Ezetimibe] Rash  Caused drowsiness    Follow-up Information     Binnie Rail, MD. Schedule an appointment as soon as possible for a visit in 1 week(s).   Specialty: Internal Medicine Contact information: [place] [address] [place], [facility] Follow up.   Specialty: Home Health Services Why: Home Health has been arragned. They will contact you to schedule apt Contact information: [address] [place] [address] [phone]                  The results of significant diagnostics from this hospitalization (including imaging, microbiology, ancillary and laboratory) are listed below for reference.    Significant Diagnostic Studies: ECHOCARDIOGRAM COMPLETE  Result Date: 12/09/2021    ECHOCARDIOGRAM REPORT   Patient Name:   [facility] Date of Exam:  12/09/2021 Medical Rec #:  [id number]       Height:       63.0 in Accession #:    [id number]      Weight:       145.0 lb Date of Birth:  07-Nov-1950       BSA:          1.687 m Patient Age:    59 years        BP:           176/75 mmHg Patient Gender: F               HR:           66 bpm. Exam Location:  Inpatient Procedure: 2D Echo, Cardiac Doppler and Color Doppler Indications:    TIA  History:        Patient has no prior history of Echocardiogram examinations.                 Risk Factors:Hypertension.  Sonographer:    Jyl Heinz Referring Phys: Evans  1. Left ventricular ejection fraction, by estimation, is 55 to 60%. The left ventricle has normal function. The left ventricle has no regional wall motion abnormalities. There is mild left ventricular hypertrophy. Left ventricular diastolic parameters are consistent with Grade I diastolic dysfunction (impaired relaxation).  2. Right ventricular systolic function is normal. The right ventricular size is normal. Tricuspid regurgitation signal is inadequate for assessing PA pressure.  3. The mitral valve is normal in structure. No evidence of mitral valve regurgitation. No evidence of mitral stenosis.  4. The aortic valve is grossly normal. There is mild calcification of the aortic valve. Aortic valve regurgitation is trivial. No aortic stenosis is present.  5. The inferior vena cava is normal in size with greater than 50% respiratory variability, suggesting right atrial pressure of 3 mmHg. Conclusion(s)/Recommendation(s): No intracardiac source of embolism detected on this transthoracic study. Consider a transesophageal echocardiogram to exclude cardiac source of embolism if clinically indicated. FINDINGS  Left Ventricle: Left ventricular ejection fraction, by estimation, is 55 to 60%. The left ventricle has normal function. The left ventricle has no regional wall motion abnormalities. The left ventricular internal cavity size was normal in  size. There is  mild left ventricular hypertrophy. Left ventricular diastolic parameters are consistent with Grade I diastolic dysfunction (impaired relaxation). Right Ventricle: The right ventricular size is normal. No increase in right ventricular wall thickness. Right ventricular systolic function is normal. Tricuspid regurgitation signal is inadequate for assessing PA pressure. Left Atrium: Left atrial size was  normal in size. Right Atrium: Right atrial size was normal in size. Pericardium: Trivial pericardial effusion is present. Mitral Valve: The mitral valve is normal in structure. No evidence of mitral valve regurgitation. No evidence of mitral valve stenosis. Tricuspid Valve: The tricuspid valve is normal in structure. Tricuspid valve regurgitation is trivial. No evidence of tricuspid stenosis. Aortic Valve: The aortic valve is grossly normal. There is mild calcification of the aortic valve. Aortic valve regurgitation is trivial. Aortic regurgitation PHT measures 441 msec. No aortic stenosis is present. Aortic valve peak gradient measures 5.3 mmHg. Pulmonic Valve: The pulmonic valve was normal in structure. Pulmonic valve regurgitation is trivial. No evidence of pulmonic stenosis. Aorta: The aortic root is normal in size and structure. Venous: The inferior vena cava is normal in size with greater than 50% respiratory variability, suggesting right atrial pressure of 3 mmHg. IAS/Shunts: No atrial level shunt detected by color flow Doppler.  LEFT VENTRICLE PLAX 2D LVIDd:         4.50 cm     Diastology LVIDs:         3.30 cm     LV e' medial:    5.00 cm/s LV PW:         1.10 cm     LV E/e' medial:  8.8 LV IVS:        0.90 cm     LV e' lateral:   9.03 cm/s LVOT diam:     2.00 cm     LV E/e' lateral: 4.9 LV SV:         57 LV SV Index:   34 LVOT Area:     3.14 cm  LV Volumes (MOD) LV vol d, MOD A2C: 59.7 ml LV vol d, MOD A4C: 71.8 ml LV vol s, MOD A2C: 26.4 ml LV vol s, MOD A4C: 31.4 ml LV SV MOD A2C:     33.3 ml  LV SV MOD A4C:     71.8 ml LV SV MOD BP:      37.0 ml RIGHT VENTRICLE             IVC RV Basal diam:  2.40 cm     IVC diam: 1.50 cm RV Mid diam:    2.00 cm RV S prime:     13.80 cm/s TAPSE (M-mode): 1.8 cm LEFT ATRIUM             Index        RIGHT ATRIUM          Index LA diam:        3.90 cm 2.31 cm/m   RA Area:     8.80 cm LA Vol (A2C):   30.2 ml 17.91 ml/m  RA Volume:   14.70 ml 8.72 ml/m LA Vol (A4C):   30.5 ml 18.08 ml/m LA Biplane Vol: 31.7 ml 18.79 ml/m  AORTIC VALVE AV Area (Vmax): 2.66 cm AV Vmax:        115.00 cm/s AV Peak Grad:   5.3 mmHg LVOT Vmax:      97.40 cm/s LVOT Vmean:     71.000 cm/s LVOT VTI:       0.183 m AI PHT:         441 msec  AORTA Ao Root diam: 3.00 cm MITRAL VALVE MV Area (PHT): 3.63 cm    SHUNTS MV Decel Time: 209 msec    Systemic VTI:  0.18 m MV E velocity: 44.00 cm/s  Systemic Diam: 2.00 cm MV A velocity:  76.20 cm/s MV E/A ratio:  0.58 Cherlynn Kaiser MD Electronically signed by Cherlynn Kaiser MD Signature Date/Time: 12/09/2021/8:13:19 PM    Final    VAS US CAROTID  Result Date: 12/09/2021 Carotid Arterial Duplex Study Patient Name:  Landri P Gambale  Date of Exam:   12/09/2021 Medical Rec #: [id number]        Accession #:    [id number] Date of Birth: [birth year]        Patient Gender: F Patient Age:   74 years Exam Location:  [facility] Procedure:      VAS US CAROTID Referring Phys: Domenic Polite --------------------------------------------------------------------------------  Indications:       TIA. Risk Factors:      Hypertension, hyperlipidemia. Limitations        Today's exam was limited due to the patient's respiratory                    variation and patient movement, patient talking. Comparison Study:  No prior studies. Performing Technologist: Oliver Hum RVT  Examination Guidelines: A complete evaluation includes B-mode imaging, spectral Doppler, color Doppler, and power Doppler as needed of all accessible portions of each vessel. Bilateral testing is  considered an integral part of a complete examination. Limited examinations for reoccurring indications may be performed as noted.  Right Carotid Findings: +----------+--------+--------+--------+-----------------------+--------+           PSV cm/sEDV cm/sStenosisPlaque Description     Comments +----------+--------+--------+--------+-----------------------+--------+ CCA Prox  88      16              smooth and heterogenous         +----------+--------+--------+--------+-----------------------+--------+ CCA Distal94      19              smooth and heterogenous         +----------+--------+--------+--------+-----------------------+--------+ ICA Prox  67      25              smooth and heterogenous         +----------+--------+--------+--------+-----------------------+--------+ ICA Distal57      15                                     tortuous +----------+--------+--------+--------+-----------------------+--------+ ECA       69      12                                              +----------+--------+--------+--------+-----------------------+--------+ +----------+--------+-------+--------+-------------------+           PSV cm/sEDV cmsDescribeArm Pressure (mmHG) +----------+--------+-------+--------+-------------------+ Subclavian162                                        +----------+--------+-------+--------+-------------------+ +---------+--------+--+--------+--+---------+ VertebralPSV cm/s54EDV cm/s14Antegrade +---------+--------+--+--------+--+---------+  Left Carotid Findings: +----------+--------+--------+--------+-----------------------+--------+           PSV cm/sEDV cm/sStenosisPlaque Description     Comments +----------+--------+--------+--------+-----------------------+--------+ CCA Prox  107     19              smooth and heterogenous         +----------+--------+--------+--------+-----------------------+--------+ CCA Distal89       22  smooth and heterogenous         +----------+--------+--------+--------+-----------------------+--------+ ICA Prox  46      10              smooth and heterogenous         +----------+--------+--------+--------+-----------------------+--------+ ICA Distal102     28                                              +----------+--------+--------+--------+-----------------------+--------+ ECA       56      13                                              +----------+--------+--------+--------+-----------------------+--------+ +----------+--------+--------+--------+-------------------+           PSV cm/sEDV cm/sDescribeArm Pressure (mmHG) +----------+--------+--------+--------+-------------------+ [id number]                                         +----------+--------+--------+--------+-------------------+ +---------+--------+--+--------+--+---------+ VertebralPSV cm/s75EDV cm/s15Antegrade +---------+--------+--+--------+--+---------+   Summary: Right Carotid: Velocities in the right ICA are consistent with a 1-39% stenosis. Left Carotid: Velocities in the left ICA are consistent with a 1-39% stenosis. Vertebrals: Bilateral vertebral arteries demonstrate antegrade flow. *See table(s) above for measurements and observations.  Electronically signed by Monica Martinez MD on 12/09/2021 at 6:18:15 PM.    Final    MR BRAIN WO CONTRAST  Result Date: 12/08/2021 CLINICAL DATA:  TIA EXAM: MRI HEAD WITHOUT CONTRAST MRA HEAD WITHOUT CONTRAST TECHNIQUE: Multiplanar, multi-echo pulse sequences of the brain and surrounding structures were acquired without intravenous contrast. Angiographic images of the Circle of Willis were acquired using MRA technique without intravenous contrast. COMPARISON:  MRI head 07/25/2018; no prior MRA; correlation is also made with CT head 12/08/2021 FINDINGS: MRI HEAD FINDINGS Brain: No restricted diffusion to suggest acute or subacute infarct.  No acute hemorrhage, mass, mass effect, or midline shift. No hemosiderin deposition to suggest remote hemorrhage. No hydrocephalus or extra-axial collection. Normal pituitary and craniocervical junction. T2 hyperintense signal in the periventricular white matter, likely the sequela of moderate chronic small vessel ischemic disease. Vascular: Please see MRA findings below. Skull and upper cervical spine: Normal marrow signal. Sinuses/Orbits: Mucous retention cysts in the right maxillary sinus. The orbits are unremarkable. Other: The mastoids are well aerated. MRA HEAD FINDINGS Anterior circulation: Both internal carotid arteries are patent to the termini, without significant stenosis. A1 segments patent, diminutive on the left normal anterior communicating artery. Anterior cerebral arteries are patent to their distal aspects. No M1 stenosis or occlusion. Normal MCA bifurcations. Distal MCA branches perfused and symmetric. Posterior circulation: Vertebral arteries patent to the vertebrobasilar junction without stenosis. Posterior inferior cerebral artery patent on the left. Prominent right AICA. Basilar patent to its distal aspect. Superior cerebellar arteries patent bilaterally. Patent P1 segments. PCAs perfused to their distal aspects without stenosis. The bilateral posterior communicating arteries are not visualized. Anatomic variants: None significant IMPRESSION: 1. No acute intracranial process. No evidence of acute or subacute infarct. 2.  No intracranial large vessel occlusion or significant stenosis. Electronically Signed   By: Merilyn Baba M.D.   On: 12/08/2021 19:21   MR ANGIO HEAD WO CONTRAST  Result Date:  12/08/2021 CLINICAL DATA:  TIA EXAM: MRI HEAD WITHOUT CONTRAST MRA HEAD WITHOUT CONTRAST TECHNIQUE: Multiplanar, multi-echo pulse sequences of the brain and surrounding structures were acquired without intravenous contrast. Angiographic images of the Circle of Willis were acquired using MRA technique  without intravenous contrast. COMPARISON:  MRI head 07/25/2018; no prior MRA; correlation is also made with CT head 12/08/2021 FINDINGS: MRI HEAD FINDINGS Brain: No restricted diffusion to suggest acute or subacute infarct. No acute hemorrhage, mass, mass effect, or midline shift. No hemosiderin deposition to suggest remote hemorrhage. No hydrocephalus or extra-axial collection. Normal pituitary and craniocervical junction. T2 hyperintense signal in the periventricular white matter, likely the sequela of moderate chronic small vessel ischemic disease. Vascular: Please see MRA findings below. Skull and upper cervical spine: Normal marrow signal. Sinuses/Orbits: Mucous retention cysts in the right maxillary sinus. The orbits are unremarkable. Other: The mastoids are well aerated. MRA HEAD FINDINGS Anterior circulation: Both internal carotid arteries are patent to the termini, without significant stenosis. A1 segments patent, diminutive on the left normal anterior communicating artery. Anterior cerebral arteries are patent to their distal aspects. No M1 stenosis or occlusion. Normal MCA bifurcations. Distal MCA branches perfused and symmetric. Posterior circulation: Vertebral arteries patent to the vertebrobasilar junction without stenosis. Posterior inferior cerebral artery patent on the left. Prominent right AICA. Basilar patent to its distal aspect. Superior cerebellar arteries patent bilaterally. Patent P1 segments. PCAs perfused to their distal aspects without stenosis. The bilateral posterior communicating arteries are not visualized. Anatomic variants: None significant IMPRESSION: 1. No acute intracranial process. No evidence of acute or subacute infarct. 2.  No intracranial large vessel occlusion or significant stenosis. Electronically Signed   By: Merilyn Baba M.D.   On: 12/08/2021 19:21   CT HEAD WO CONTRAST  Result Date: 12/08/2021 CLINICAL DATA:  Transient ischemic attack (TIA) EXAM: CT HEAD WITHOUT  CONTRAST TECHNIQUE: Contiguous axial images were obtained from the base of the skull through the vertex without intravenous contrast. RADIATION DOSE REDUCTION: This exam was performed according to the departmental dose-optimization program which includes automated exposure control, adjustment of the mA and/or kV according to patient size and/or use of iterative reconstruction technique. COMPARISON:  07/25/2018 FINDINGS: Brain: No evidence of acute infarction, hemorrhage, hydrocephalus, extra-axial collection or mass lesion/mass effect. Scattered low-density changes within the periventricular and subcortical white matter compatible with chronic microvascular ischemic change. Vascular: No hyperdense vessel or unexpected calcification. Skull: Normal. Negative for fracture or focal lesion. Sinuses/Orbits: No acute finding. Other: None. IMPRESSION: No acute intracranial findings. Electronically Signed   By: Davina Poke D.O.   On: 12/08/2021 09:46    Microbiology: Recent Results (from the past 240 hour(s))  Resp Panel by RT-PCR (Flu A&B, Covid) Anterior Nasal Swab     Status: None   Collection Time: 12/08/21  8:18 PM   Specimen: Anterior Nasal Swab  Result Value Ref Range Status   SARS Coronavirus 2 by RT PCR NEGATIVE NEGATIVE Final    Comment: (NOTE) SARS-CoV-2 target nucleic acids are NOT DETECTED.  The SARS-CoV-2 RNA is generally detectable in upper respiratory specimens during the acute phase of infection. The lowest concentration of SARS-CoV-2 viral copies this assay can detect is 138 copies/mL. A negative result does not preclude SARS-Cov-2 infection and should not be used as the sole basis for treatment or other patient management decisions. A negative result may occur with  improper specimen collection/handling, submission of specimen other than nasopharyngeal swab, presence of viral mutation(s) within the areas targeted by this assay,  and inadequate number of viral copies(<138  copies/mL). A negative result must be combined with clinical observations, patient history, and epidemiological information. The expected result is Negative.  Fact Sheet for Patients:  EntrepreneurPulse.com.au  Fact Sheet for Healthcare Providers:  IncredibleEmployment.be  This test is no t yet approved or cleared by the [place] FDA and  has been authorized for detection and/or diagnosis of SARS-CoV-2 by FDA under an Emergency Use Authorization (EUA). This EUA will remain  in effect (meaning this test can be used) for the duration of the COVID-19 declaration under Section 564(b)(1) of the Act, 21 U.S.C.section 360bbb-3(b)(1), unless the authorization is terminated  or revoked sooner.       Influenza A by PCR NEGATIVE NEGATIVE Final   Influenza B by PCR NEGATIVE NEGATIVE Final    Comment: (NOTE) The Xpert Xpress SARS-CoV-2/FLU/RSV plus assay is intended as an aid in the diagnosis of influenza from Nasopharyngeal swab specimens and should not be used as a sole basis for treatment. Nasal washings and aspirates are unacceptable for Xpert [facility] SARS-CoV-2/FLU/RSV testing.  Fact Sheet for Patients: EntrepreneurPulse.com.au  Fact Sheet for Healthcare Providers: IncredibleEmployment.be  This test is not yet approved or cleared by the [place] FDA and has been authorized for detection and/or diagnosis of SARS-CoV-2 by FDA under an Emergency Use Authorization (EUA). This EUA will remain in effect (meaning this test can be used) for the duration of the COVID-19 declaration under Section 564(b)(1) of the Act, 21 U.S.C. section 360bbb-3(b)(1), unless the authorization is terminated or revoked.  Performed at [facility], [place] [address], [place],  [address]      Labs: Basic Metabolic Panel: Recent Labs  Lab 12/08/21 0918 12/08/21 0930  NA 139 138  K 3.9 4.0  CL 104 104  CO2 25  --    GLUCOSE 90 91  BUN 6* 11  CREATININE 0.83 0.80  CALCIUM 10.0  --    Liver Function Tests: Recent Labs  Lab 12/08/21 0918  AST 18  ALT 18  ALKPHOS 75  BILITOT 0.7  PROT 7.3  ALBUMIN 3.9   No results for input(s): "LIPASE", "AMYLASE" in the last 168 hours. No results for input(s): "AMMONIA" in the last 168 hours. CBC: Recent Labs  Lab 12/08/21 0918 12/08/21 0930  WBC 6.2  --   NEUTROABS 4.3  --   HGB 14.5 15.6*  HCT 46.7* 46.0  MCV 83.8  --   PLT 218  --    Cardiac Enzymes: No results for input(s): "CKTOTAL", "CKMB", "CKMBINDEX", "TROPONINI" in the last 168 hours. BNP: BNP (last 3 results) No results for input(s): "BNP" in the last 8760 hours.  ProBNP (last 3 results) No results for input(s): "PROBNP" in the last 8760 hours.  CBG: No results for input(s): "GLUCAP" in the last 168 hours.     Signed:  Domenic Polite MD.  [facility] 12/10/2021, 11:28 AM

## 2021-12-10 NOTE — Telephone Encounter (Signed)
Patient called back stating she was ready to reschedule her colonoscopy.  She said when she went to the ER that her blood pressure was normal and they put her back on 81 mg of aspirin to help out.  She said she can't take the aspirin because it agitates her reflux and she started having that burning sensation again.  With all of her past issues, I felt it would be best to get your advice as to how you wanted to reschedule her for her procedure.  Please call patient and advise.  Thank you.

## 2021-12-10 NOTE — Telephone Encounter (Signed)
Patient called back to see if anyone had tried to call her back about the previous note.  She also wanted to add that she has not had a bowel movement since the date of her colonoscopy and would like some advice as to what she needs to do about that.  Please call and advise.  Thank you.

## 2021-12-10 NOTE — Progress Notes (Signed)
Occupational Therapy Treatment Patient Details Name: Karen Evans MRN: [id number] DOB: 27-Dec-1950 Today's Date: 12/10/2021   History of present illness Pt is a 71 year old woman admitted on 12/08/21 prior to scheduled colonoscopy with L facial droop and L side weakness and numbness. Brain MRI negative for acute changes. PMH: colon polyps, HLD, HTN, atypical migraine.   OT comments  Pt continues to demonstrate impaired attention, memory and problem solving. Educated in compensatory strategies for memory deficits and importance of slowing pace and attending to tasks. Pt failed pill box test due to time constraints and difficulty attending, but accurate with set up. Pt able to recall 911 for emergencies, recommended pt consider medical alert system as she lives alone, pt stated she had been told that in the past.    Recommendations for follow up therapy are one component of a multi-disciplinary discharge planning process, led by the attending physician.  Recommendations may be updated based on patient status, additional functional criteria and insurance authorization.    Follow Up Recommendations  Home health OT    Assistance Recommended at Discharge PRN  Patient can return home with the following  Direct supervision/assist for medications management;Direct supervision/assist for financial management   Equipment Recommendations  None recommended by OT    Recommendations for Other Services      Precautions / Restrictions Precautions Precautions: None       Mobility Bed Mobility Overal bed mobility: Independent                  Transfers Overall transfer level: Independent Equipment used: None                     Balance Overall balance assessment: Needs assistance   Sitting balance-Leahy Scale: Good       Standing balance-Leahy Scale: Good                             ADL either performed or assessed with clinical judgement   ADL                            Toilet Transfer: Independent   Toileting- Clothing Manipulation and Hygiene: Independent       Functional mobility during ADLs: Independent General ADL Comments: Educated in compensatory strategies for memory deficits: use of calendar, post it notes/lists, pill box.    Extremity/Trunk Assessment              Vision       Perception     Praxis      Cognition Arousal/Alertness: Awake/alert Behavior During Therapy: WFL for tasks assessed/performed Overall Cognitive Status: Impaired/Different from baseline Area of Impairment: Attention, Memory, Problem solving, Safety/judgement                   Current Attention Level: Sustained Memory: Decreased short-term memory   Safety/Judgement: Decreased awareness of deficits     General Comments: Administered pill box test, pt with accurate set up of box, but required increased time, internally distracted.        Exercises      Shoulder Instructions       General Comments      Pertinent Vitals/ Pain       Pain Assessment Pain Assessment: No/denies pain  Home Living  Prior Functioning/Environment              Frequency  Min 2X/week        Progress Toward Goals  OT Goals(current goals can now be found in the care plan section)  Progress towards OT goals: Progressing toward goals  Acute Rehab OT Goals OT Goal Formulation: With patient Time For Goal Achievement: 12/23/21 Potential to Achieve Goals: Good  Plan Discharge plan remains appropriate    Co-evaluation                 AM-PAC OT "6 Clicks" Daily Activity     Outcome Measure   Help from another person eating meals?: None Help from another person taking care of personal grooming?: None Help from another person toileting, which includes using toliet, bedpan, or urinal?: None Help from another person bathing (including washing, rinsing,  drying)?: None Help from another person to put on and taking off regular upper body clothing?: None Help from another person to put on and taking off regular lower body clothing?: None 6 Click Score: 24    End of Session    OT Visit Diagnosis: Other symptoms and signs involving cognitive function   Activity Tolerance Patient tolerated treatment well   Patient Left in chair;with call bell/phone within reach   Nurse Communication          Time: 1696-7893 OT Time Calculation (min): 28 min  Charges: OT General Charges $OT Visit: 1 Visit OT Treatments $Self Care/Home Management : 8-22 mins $Cognitive Funtion inital: Initial 15 mins  Cleta Alberts, OTR/L Acute Rehabilitation Services Office: [phone]   Karen Evans 12/10/2021, 9:15 AM

## 2021-12-14 ENCOUNTER — Telehealth: Payer: Self-pay | Admitting: Internal Medicine

## 2021-12-14 NOTE — Telephone Encounter (Signed)
Pt has had a bowel movement and is fine with that issue.  Pt wanting to reschedule her Colon. She was seen in the ER for possible TIA. Please advise if you want pt to be seen prior to scheduling procedure.l

## 2021-12-14 NOTE — Telephone Encounter (Signed)
Discharge summary reviewed, felt to be more likely complex migraine versus TIA, with the former being felt more likely MRI MRA negative Risk factor modification Started on baby aspirin  Okay to reschedule colonoscopy as an outpatient in the [facility]

## 2021-12-14 NOTE — Telephone Encounter (Signed)
See note below from Dr. Hilarie Fredrickson, ok to reschedule pt for colon and previsit in the [facility].

## 2021-12-14 NOTE — Telephone Encounter (Signed)
Called patient to schedule PV and colonoscopy.  She said the doctor at the hospital put her on aspirin.  She is also taking OTC Allegra and said she didn't think she was supposed to take the two of them together.  Please call patient and advise.  Thank you.

## 2021-12-14 NOTE — Telephone Encounter (Signed)
Discussed with pt that allegra and baby aspirin should not be a problem but she should contact her PCP to confirm. Pt verbalized understanding.

## 2021-12-17 ENCOUNTER — Encounter: Payer: Self-pay | Admitting: Internal Medicine

## 2021-12-17 NOTE — Progress Notes (Signed)
Subjective:    Patient ID: Karen Evans, female    DOB: 06-29-1950, 71 y.o.   MRN: [id number]     HPI Karen Evans is here for follow up from the hospital.    Admitted 7/25 - 7/27 for stroke like symptoms  She went for her colonoscopy that day.  She was fatigued that day, but felt fine.  Shortly after arriving she noticed L facial droop and then L arm and leg tingling/numbness.  Symptoms lasted 35 min and then resolved completely.  BP was normal initially in ED, then rose to > 200.  No denies dysphagia, dysarthria, but had mildly stuttering speech ( but that is her baseline).    Triansient left facial droop, left arm and leg tingling: Suspected complex migraine vs TIA, she had a HA prior to onset of symptoms MRI/MRA neg for acute stroke, no large vessel occlusions Started on ASA 81 mg daily Echo - nml EF, no cardiac source of embolus and carotid duplex unremarkable LDL 111, crestor increased to daily A1c 5.7% Neuro consulted  PT/OT/ST/Nutrition - home PT/OT   Htn: Allowed permissive htn  Atenolol, micardis restarted  Dyslipidemia: Continue crestor - increase to daily ( was TIW)-still taking it 3 times a week  Mild memory, cognitive deficits B12, tsh normal OT eval, completed, home health OT recommended Needs intermittent supervision - home health RN requested   She denies any recurrence of any numbness, tingling or weakness.  She thinks it was all probably related to her migraine which she did have prior to going to the colonoscopy appointment.  She denies ever having any symptoms like this in the past.   Medications and allergies reviewed with patient and updated if appropriate.  Current Outpatient Medications on File Prior to Visit  Medication Sig Dispense Refill   acetaminophen (TYLENOL) 500 MG tablet Take 250-500 mg by mouth every 6 (six) hours as needed for headache (pain).     aspirin EC 81 MG tablet Take 1 tablet (81 mg total) by mouth daily. Swallow whole. 30  tablet 12   atenolol (TENORMIN) 25 MG tablet TAKE 1 TABLET BY MOUTH DAILY (Patient taking differently: Take 25 mg by mouth daily.) 90 tablet 3   betamethasone valerate ointment (VALISONE) 0.1 % Apply a pea sized amount BID for up to 2 weeks as needed (Patient taking differently: Apply 1 Application topically 2 (two) times daily as needed (irritation).) 30 g 0   Calcium Carbonate-Vitamin D (CALTRATE 600+D PO) Take 1 tablet by mouth daily.      cholecalciferol (VITAMIN D) 1000 units tablet Take 1,000 Units by mouth daily.     fexofenadine (ALLEGRA) 180 MG tablet Take 180 mg by mouth daily.     Fluticasone Propionate (FLONASE NA) Place 1 spray into the nose daily as needed (congestion).     hydrocortisone (ANUSOL-HC) 25 MG suppository Place 25 mg rectally as needed for hemorrhoids or anal itching.     Lactase (LACTAID PO) Take 1 tablet by mouth 2 (two) times daily as needed (stomach upset/ gas).      Multiple Vitamin (MULTIVITAMIN WITH MINERALS) TABS tablet Take 1 tablet by mouth daily.     Na Sulfate-K Sulfate-Mg Sulf 17.5-3.13-1.6 GM/177ML SOLN Take 1 kit by mouth as directed. May use generic SUPREP;NO prior authorizations will be done.Please use Singlecare or GOOD-RX coupon. (Patient not taking: Reported on 12/08/2021) 354 mL 0   Propylene Glycol (SYSTANE COMPLETE OP) Place 1 drop into both eyes at bedtime.  rosuvastatin (CRESTOR) 5 MG tablet Take 1 tablet (5 mg total) by mouth daily.     telmisartan (MICARDIS) 80 MG tablet TAKE 1 TABLET BY MOUTH  DAILY (Patient taking differently: Take 80 mg by mouth daily.) 90 tablet 1   No current facility-administered medications on file prior to visit.     Review of Systems  Constitutional:  Negative for chills and fever.  Respiratory:  Negative for cough, shortness of breath and wheezing.   Cardiovascular:  Positive for palpitations. Negative for chest pain and leg swelling.  Neurological:  Positive for light-headedness and headaches.        Objective:   Vitals:   12/18/21 1507  BP: 130/80  Pulse: 60  Temp: 98.1 F (36.7 C)  SpO2: 99%   BP Readings from Last 3 Encounters:  12/18/21 130/80  12/10/21 114/89  11/25/21 124/70   Wt Readings from Last 3 Encounters:  12/18/21 149 lb (67.6 kg)  12/08/21 145 lb (65.8 kg)  11/27/21 148 lb (67.1 kg)   Body mass index is 26.39 kg/m.    Physical Exam Constitutional:      General: She is not in acute distress.    Appearance: Normal appearance.  HENT:     Head: Normocephalic and atraumatic.  Eyes:     Conjunctiva/sclera: Conjunctivae normal.  Cardiovascular:     Rate and Rhythm: Normal rate and regular rhythm.     Heart sounds: Normal heart sounds. No murmur heard. Pulmonary:     Effort: Pulmonary effort is normal. No respiratory distress.     Breath sounds: Normal breath sounds. No wheezing.  Musculoskeletal:     Cervical back: Neck supple.     Right lower leg: No edema.     Left lower leg: No edema.  Lymphadenopathy:     Cervical: No cervical adenopathy.  Skin:    General: Skin is warm and dry.     Findings: No rash.  Neurological:     Mental Status: She is alert. Mental status is at baseline.     Sensory: No sensory deficit.     Motor: No weakness.  Psychiatric:        Mood and Affect: Mood normal.        Behavior: Behavior normal.        Lab Results  Component Value Date   WBC 6.2 12/08/2021   HGB 15.6 (H) 12/08/2021   HCT 46.0 12/08/2021   PLT 218 12/08/2021   GLUCOSE 91 12/08/2021   CHOL 208 (H) 12/09/2021   TRIG 134 12/09/2021   HDL 70 12/09/2021   LDLDIRECT 139.0 02/09/2021   LDLCALC 111 (H) 12/09/2021   ALT 18 12/08/2021   AST 18 12/08/2021   NA 138 12/08/2021   K 4.0 12/08/2021   CL 104 12/08/2021   CREATININE 0.80 12/08/2021   BUN 11 12/08/2021   CO2 25 12/08/2021   TSH 0.769 12/09/2021   INR 1.0 12/08/2021   HGBA1C 5.7 (H) 12/09/2021   ECHOCARDIOGRAM COMPLETE    ECHOCARDIOGRAM REPORT       Patient Name:   [place] Date of Exam: 12/09/2021 Medical Rec #:  [id number]       Height:       63.0 in Accession #:    [id number]      Weight:       145.0 lb Date of Birth:  1950/10/11       BSA:          1.687 m Patient Age:  71 years        BP:           176/75 mmHg Patient Gender: F               HR:           66 bpm. Exam Location:  Inpatient  Procedure: 2D Echo, Cardiac Doppler and Color Doppler  Indications:    TIA   History:        Patient has no prior history of Echocardiogram examinations.                 Risk Factors:Hypertension.   Sonographer:    Jyl Heinz Referring Phys: [place]   1. Left ventricular ejection fraction, by estimation, is 55 to 60%. The left ventricle has normal function. The left ventricle has no regional wall motion abnormalities. There is mild left ventricular hypertrophy. Left ventricular diastolic parameters  are consistent with Grade I diastolic dysfunction (impaired relaxation).  2. Right ventricular systolic function is normal. The right ventricular size is normal. Tricuspid regurgitation signal is inadequate for assessing PA pressure.  3. The mitral valve is normal in structure. No evidence of mitral valve regurgitation. No evidence of mitral stenosis.  4. The aortic valve is grossly normal. There is mild calcification of the aortic valve. Aortic valve regurgitation is trivial. No aortic stenosis is present.  5. The inferior vena cava is normal in size with greater than 50% respiratory variability, suggesting right atrial pressure of 3 mmHg.  Conclusion(s)/Recommendation(s): No intracardiac source of embolism detected on this transthoracic study. Consider a transesophageal echocardiogram to exclude cardiac source of embolism if clinically indicated.  FINDINGS  Left Ventricle: Left ventricular ejection fraction, by estimation, is 55 to 60%. The left ventricle has normal function. The left ventricle has no regional wall motion  abnormalities. The left ventricular internal cavity size was normal in size. There is  mild left ventricular hypertrophy. Left ventricular diastolic parameters are consistent with Grade I diastolic dysfunction (impaired relaxation).  Right Ventricle: The right ventricular size is normal. No increase in right ventricular wall thickness. Right ventricular systolic function is normal. Tricuspid regurgitation signal is inadequate for assessing PA pressure.  Left Atrium: Left atrial size was normal in size.  Right Atrium: Right atrial size was normal in size.  Pericardium: Trivial pericardial effusion is present.  Mitral Valve: The mitral valve is normal in structure. No evidence of mitral valve regurgitation. No evidence of mitral valve stenosis.  Tricuspid Valve: The tricuspid valve is normal in structure. Tricuspid valve regurgitation is trivial. No evidence of tricuspid stenosis.  Aortic Valve: The aortic valve is grossly normal. There is mild calcification of the aortic valve. Aortic valve regurgitation is trivial. Aortic regurgitation PHT measures 441 msec. No aortic stenosis is present. Aortic valve peak gradient measures 5.3  mmHg.  Pulmonic Valve: The pulmonic valve was normal in structure. Pulmonic valve regurgitation is trivial. No evidence of pulmonic stenosis.  Aorta: The aortic root is normal in size and structure.  Venous: The inferior vena cava is normal in size with greater than 50% respiratory variability, suggesting right atrial pressure of 3 mmHg.  IAS/Shunts: No atrial level shunt detected by color flow Doppler.    LEFT VENTRICLE PLAX 2D LVIDd:         4.50 cm     Diastology LVIDs:         3.30 cm     LV e' medial:    5.00 cm/s  LV PW:         1.10 cm     LV E/e' medial:  8.8 LV IVS:        0.90 cm     LV e' lateral:   9.03 cm/s LVOT diam:     2.00 cm     LV E/e' lateral: 4.9 LV SV:         57 LV SV Index:   34 LVOT Area:     3.14 cm   LV Volumes (MOD) LV vol d,  MOD A2C: 59.7 ml LV vol d, MOD A4C: 71.8 ml LV vol s, MOD A2C: 26.4 ml LV vol s, MOD A4C: 31.4 ml LV SV MOD A2C:     33.3 ml LV SV MOD A4C:     71.8 ml LV SV MOD BP:      37.0 ml  RIGHT VENTRICLE             IVC RV Basal diam:  2.40 cm     IVC diam: 1.50 cm RV Mid diam:    2.00 cm RV S prime:     13.80 cm/s TAPSE (M-mode): 1.8 cm  LEFT ATRIUM             Index        RIGHT ATRIUM          Index LA diam:        3.90 cm 2.31 cm/m   RA Area:     8.80 cm LA Vol (A2C):   30.2 ml 17.91 ml/m  RA Volume:   14.70 ml 8.72 ml/m LA Vol (A4C):   30.5 ml 18.08 ml/m LA Biplane Vol: 31.7 ml 18.79 ml/m  AORTIC VALVE AV Area (Vmax): 2.66 cm AV Vmax:        115.00 cm/s AV Peak Grad:   5.3 mmHg LVOT Vmax:      97.40 cm/s LVOT Vmean:     71.000 cm/s LVOT VTI:       0.183 m AI PHT:         441 msec   AORTA Ao Root diam: 3.00 cm  MITRAL VALVE MV Area (PHT): 3.63 cm    SHUNTS MV Decel Time: 209 msec    Systemic VTI:  0.18 m MV E velocity: 44.00 cm/s  Systemic Diam: 2.00 cm MV A velocity: 76.20 cm/s MV E/A ratio:  0.58  Cherlynn Kaiser MD Electronically signed by Cherlynn Kaiser MD Signature Date/Time: 12/09/2021/8:13:19 PM      Final   VAS US CAROTID Carotid Arterial Duplex Study  Patient Name:  [place]  Date of Exam:   12/09/2021 Medical Rec #: [id number]        Accession #:    [id number] Date of Birth: 09/10/1950        Patient Gender: F Patient Age:   74 years Exam Location:  [facility] Procedure:      VAS US CAROTID Referring Phys: Domenic Polite  --------------------------------------------------------------------------------   Indications:       TIA. Risk Factors:      Hypertension, hyperlipidemia. Limitations        Today's exam was limited due to the patient's respiratory                    variation and patient movement, patient talking. Comparison Study:  No prior studies.  Performing Technologist: Oliver Hum RVT    Examination Guidelines:  A complete evaluation includes B-mode imaging, spectral Doppler, color Doppler,  and power Doppler as needed of all accessible portions of each vessel. Bilateral testing is considered an integral part of a complete examination. Limited examinations for reoccurring indications may be performed as noted.    Right Carotid Findings: +----------+--------+--------+--------+-----------------------+--------+           PSV cm/sEDV cm/sStenosisPlaque Description     Comments +----------+--------+--------+--------+-----------------------+--------+ CCA Prox  88      16              smooth and heterogenous         +----------+--------+--------+--------+-----------------------+--------+ CCA Distal94      19              smooth and heterogenous         +----------+--------+--------+--------+-----------------------+--------+ ICA Prox  67      25              smooth and heterogenous         +----------+--------+--------+--------+-----------------------+--------+ ICA Distal57      15                                     tortuous +----------+--------+--------+--------+-----------------------+--------+ ECA       69      12                                              +----------+--------+--------+--------+-----------------------+--------+  +----------+--------+-------+--------+-------------------+           PSV cm/sEDV cmsDescribeArm Pressure (mmHG) +----------+--------+-------+--------+-------------------+ Subclavian162                                        +----------+--------+-------+--------+-------------------+  +---------+--------+--+--------+--+---------+ VertebralPSV cm/s54EDV cm/s14Antegrade +---------+--------+--+--------+--+---------+     Left Carotid Findings: +----------+--------+--------+--------+-----------------------+--------+           PSV cm/sEDV cm/sStenosisPlaque Description      Comments +----------+--------+--------+--------+-----------------------+--------+ CCA Prox  107     19              smooth and heterogenous         +----------+--------+--------+--------+-----------------------+--------+ CCA Distal89      22              smooth and heterogenous         +----------+--------+--------+--------+-----------------------+--------+ ICA Prox  46      10              smooth and heterogenous         +----------+--------+--------+--------+-----------------------+--------+ ICA Distal102     28                                              +----------+--------+--------+--------+-----------------------+--------+ ECA       56      13                                              +----------+--------+--------+--------+-----------------------+--------+  +----------+--------+--------+--------+-------------------+           PSV cm/sEDV cm/sDescribeArm Pressure (mmHG) +----------+--------+--------+--------+-------------------+ [id number]                                         +----------+--------+--------+--------+-------------------+  +---------+--------+--+--------+--+---------+  VertebralPSV cm/s75EDV cm/s15Antegrade +---------+--------+--+--------+--+---------+        Summary: Right Carotid: Velocities in the right ICA are consistent with a 1-39% stenosis.  Left Carotid: Velocities in the left ICA are consistent with a 1-39% stenosis.  Vertebrals: Bilateral vertebral arteries demonstrate antegrade flow.  *See table(s) above for measurements and observations.    Electronically signed by Monica Martinez MD on 12/09/2021 at 6:18:15 PM.      Final      Assessment & Plan:    See Problem List for Assessment and Plan of chronic medical problems.

## 2021-12-17 NOTE — Patient Instructions (Addendum)
     Medications changes include :   none      Return for follow up as scheduled in November.

## 2021-12-18 ENCOUNTER — Ambulatory Visit (INDEPENDENT_AMBULATORY_CARE_PROVIDER_SITE_OTHER): Payer: Medicare Other | Admitting: Internal Medicine

## 2021-12-18 VITALS — BP 130/80 | HR 60 | Temp 98.1°F | Ht 63.0 in | Wt 149.0 lb

## 2021-12-18 DIAGNOSIS — R7303 Prediabetes: Secondary | ICD-10-CM

## 2021-12-18 DIAGNOSIS — E7849 Other hyperlipidemia: Secondary | ICD-10-CM | POA: Diagnosis not present

## 2021-12-18 DIAGNOSIS — I10 Essential (primary) hypertension: Secondary | ICD-10-CM | POA: Diagnosis not present

## 2021-12-18 DIAGNOSIS — G459 Transient cerebral ischemic attack, unspecified: Secondary | ICD-10-CM | POA: Diagnosis not present

## 2021-12-18 MED ORDER — ROSUVASTATIN CALCIUM 5 MG PO TABS: 5.0000 mg | ORAL_TABLET | Freq: Every day | ORAL | 1 refills | Status: DC

## 2021-12-18 NOTE — Assessment & Plan Note (Addendum)
Chronic Taking crestor 5 mg TIW - reluctant to take it daily - advised to take 4 times a week

## 2021-12-18 NOTE — Assessment & Plan Note (Signed)
Recent episode was TIA vs complex migraine Work up reassuring Continue ASA 81 g daily Taking crestor 5 mg TIW - reluctant to take it daily - advised to take 4 times a week BP controlled.

## 2021-12-18 NOTE — Assessment & Plan Note (Signed)
Chronic BP well controlled Continue atenolol 25 mg daily, telmisartan 80 mg daily

## 2021-12-18 NOTE — Assessment & Plan Note (Signed)
Chronic Lab Results  Component Value Date   HGBA1C 5.7 (H) 12/09/2021   Will work in increasing exercise and diet

## 2021-12-23 ENCOUNTER — Ambulatory Visit (AMBULATORY_SURGERY_CENTER): Payer: Self-pay

## 2021-12-23 VITALS — Ht 63.0 in | Wt 149.0 lb

## 2021-12-23 DIAGNOSIS — Z86010 Personal history of colonic polyps: Secondary | ICD-10-CM

## 2021-12-23 MED ORDER — NA SULFATE-K SULFATE-MG SULF 17.5-3.13-1.6 GM/177ML PO SOLN: 1.0000 | ORAL | 0 refills | Status: DC

## 2021-12-23 NOTE — Progress Notes (Signed)
No egg or soy allergy known to patient  No issues known to pt with past sedation with any surgeries or procedures Patient denies ever being told they had issues or difficulty with intubation  No FH of Malignant Hyperthermia Pt is not on diet pills Pt is not on  home 02  Pt is not on blood thinners  Pt denies issues with constipation  No A fib or A flutter Have any cardiac testing pending--denied Pt instructed to use Singlecare.com or GoodRx for a price reduction on prep   

## 2022-01-05 ENCOUNTER — Encounter: Payer: Self-pay | Admitting: Internal Medicine

## 2022-01-13 ENCOUNTER — Ambulatory Visit (AMBULATORY_SURGERY_CENTER): Payer: Medicare Other | Admitting: Internal Medicine

## 2022-01-13 ENCOUNTER — Encounter: Payer: Self-pay | Admitting: Internal Medicine

## 2022-01-13 VITALS — BP 112/54 | HR 59 | Temp 96.9°F | Resp 12 | Ht 63.0 in | Wt 149.0 lb

## 2022-01-13 DIAGNOSIS — K621 Rectal polyp: Secondary | ICD-10-CM | POA: Diagnosis not present

## 2022-01-13 DIAGNOSIS — Z86010 Personal history of colonic polyps: Secondary | ICD-10-CM

## 2022-01-13 DIAGNOSIS — K639 Disease of intestine, unspecified: Secondary | ICD-10-CM | POA: Diagnosis not present

## 2022-01-13 DIAGNOSIS — K623 Rectal prolapse: Secondary | ICD-10-CM

## 2022-01-13 DIAGNOSIS — Z09 Encounter for follow-up examination after completed treatment for conditions other than malignant neoplasm: Secondary | ICD-10-CM

## 2022-01-13 MED ORDER — SODIUM CHLORIDE 0.9 % IV SOLN: 500.0000 mL | Freq: Once | INTRAVENOUS | Status: DC

## 2022-01-13 NOTE — Progress Notes (Signed)
Called to room to assist during endoscopic procedure.  Patient ID and intended procedure confirmed with present staff. Received instructions for my participation in the procedure from the performing physician.  

## 2022-01-13 NOTE — Progress Notes (Signed)
To pacu, VSS. Report to [email or web address].tb 

## 2022-01-13 NOTE — Patient Instructions (Signed)
Resume previous diet and medications. Awaiting pathology results. Repeat Colonoscopy date to be determined based on pathology results.  YOU HAD AN ENDOSCOPIC PROCEDURE TODAY AT [facility]:   Refer to the procedure report that was given to you for any specific questions about what was found during the examination.  If the procedure report does not answer your questions, please call your gastroenterologist to clarify.  If you requested that your care partner not be given the details of your procedure findings, then the procedure report has been included in a sealed envelope for you to review at your convenience later.  YOU SHOULD EXPECT: Some feelings of bloating in the abdomen. Passage of more gas than usual.  Walking can help get rid of the air that was put into your GI tract during the procedure and reduce the bloating. If you had a lower endoscopy (such as a colonoscopy or flexible sigmoidoscopy) you may notice spotting of blood in your stool or on the toilet paper. If you underwent a bowel prep for your procedure, you may not have a normal bowel movement for a few days.  Please Note:  You might notice some irritation and congestion in your nose or some drainage.  This is from the oxygen used during your procedure.  There is no need for concern and it should clear up in a day or so.  SYMPTOMS TO REPORT IMMEDIATELY:  Following lower endoscopy (colonoscopy or flexible sigmoidoscopy):  Excessive amounts of blood in the stool  Significant tenderness or worsening of abdominal pains  Swelling of the abdomen that is new, acute  Fever of 100F or higher  For urgent or emergent issues, a gastroenterologist can be reached at any hour by calling [phone]. Do not use MyChart messaging for urgent concerns.    DIET:  We do recommend a small meal at first, but then you may proceed to your regular diet.  Drink plenty of fluids but you should avoid alcoholic beverages for 24  hours.  ACTIVITY:  You should plan to take it easy for the rest of today and you should NOT DRIVE or use heavy machinery until tomorrow (because of the sedation medicines used during the test).    FOLLOW UP: Our staff will call the number listed on your records the next business day following your procedure.  We will call around 7:15- 8:00 am to check on you and address any questions or concerns that you may have regarding the information given to you following your procedure. If we do not reach you, we will leave a message.  If you develop any symptoms (ie: fever, flu-like symptoms, shortness of breath, cough etc.) before then, please call [phone].  If you test positive for Covid 19 in the 2 weeks post procedure, please call and report this information to [place].    If any biopsies were taken you will be contacted by phone or by letter within the next 1-3 weeks.  Please call us at [phone] if you have not heard about the biopsies in 3 weeks.    SIGNATURES/CONFIDENTIALITY: You and/or your care partner have signed paperwork which will be entered into your electronic medical record.  These signatures attest to the fact that that the information above on your After Visit Summary has been reviewed and is understood.  Full responsibility of the confidentiality of this discharge information lies with you and/or your care-partner.

## 2022-01-13 NOTE — Op Note (Signed)
[facility] Patient Name: Karen Evans Procedure Date: 01/13/2022 9:36 AM MRN: [id number] Endoscopist: Jerene Bears , MD Age: 71 Referring MD:  Date of Birth: 09/04/1950 Gender: Female Account #: [id number] Procedure:                Colonoscopy Indications:              High risk colon cancer surveillance: Personal                            history of non-advanced adenoma, Last colonoscopy:                            March 2018 Medicines:                Monitored Anesthesia Care Procedure:                Pre-Anesthesia Assessment:                           - Prior to the procedure, a History and Physical                            was performed, and patient medications and                            allergies were reviewed. The patient's tolerance of                            previous anesthesia was also reviewed. The risks                            and benefits of the procedure and the sedation                            options and risks were discussed with the patient.                            All questions were answered, and informed consent                            was obtained. Prior Anticoagulants: The patient has                            taken no previous anticoagulant or antiplatelet                            agents. ASA Grade Assessment: II - A patient with                            mild systemic disease. After reviewing the risks                            and benefits, the patient was deemed in  satisfactory condition to undergo the procedure.                           After obtaining informed consent, the colonoscope                            was passed under direct vision. Throughout the                            procedure, the patient's blood pressure, pulse, and                            oxygen saturations were monitored continuously. The                            Olympus PCF-H190DL ([id number]) Colonoscope was                             introduced through the anus and advanced to the                            cecum, identified by appendiceal orifice and                            ileocecal valve. The colonoscopy was performed                            without difficulty. The patient tolerated the                            procedure well. The quality of the bowel                            preparation was good. The ileocecal valve,                            appendiceal orifice, and rectum were photographed. Scope In: 9:59:55 AM Scope Out: 10:11:23 AM Scope Withdrawal Time: 0 hours 9 minutes 50 seconds  Total Procedure Duration: 0 hours 11 minutes 28 seconds  Findings:                 The digital rectal exam was normal.                           An area of nodular mucosa was found in the distal                            rectum. Biopsies were taken with a cold forceps for                            histology to exclude adenomatous change (though                            likely prolapse related).  The exam was otherwise without abnormality on                            direct and retroflexion views. Complications:            No immediate complications. Estimated Blood Loss:     Estimated blood loss was minimal. Impression:               - Nodular mucosa in the distal rectum. Biopsied.                           - The examination was otherwise normal on direct                            and retroflexion views. Recommendation:           - Patient has a contact number available for                            emergencies. The signs and symptoms of potential                            delayed complications were discussed with the                            patient. Return to normal activities tomorrow.                            Written discharge instructions were provided to the                            patient.                           - Resume previous diet.                            - Continue present medications.                           - Await pathology results.                           - Repeat colonoscopy may be recommended for                            surveillance. The colonoscopy date will be                            determined after pathology results from today's                            exam become available for review. Jerene Bears, MD 01/13/2022 10:15:18 AM This report has been signed electronically.

## 2022-01-13 NOTE — Progress Notes (Signed)
Patient ID: Karen Evans, female   DOB: [birth year], 71 y.o.   MRN: [id number]    GASTROENTEROLOGY PROCEDURE H&P NOTE   Primary Care Physician: Binnie Rail, MD    Reason for Procedure:  History of adenomatous colon polyps  Plan:    Colonoscopy  Patient is appropriate for endoscopic procedure(s) in the ambulatory ([facility]) setting.  The nature of the procedure, as well as the risks, benefits, and alternatives were carefully and thoroughly reviewed with the patient. Ample time for discussion and questions allowed. The patient understood, was satisfied, and agreed to proceed.     HPI: Karen Evans is a 71 y.o. female who presents for surveillance colonoscopy.  Medical history as below.  Tolerated the prep.  No recent chest pain or shortness of breath.  No abdominal pain today.  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   Heart murmur    Hiatal hernia 2002   EGD   Hypertension    Internal hemorrhoids    Migraines    when takes vitamins    Rectal polyp 01/24/2012   TUBULAR ADENOMA   Reflux esophagitis 2007   EGD   Stricture and stenosis of esophagus 2007   EGD   Tubular adenoma of colon    Ulcer    Vitamin D deficiency     Past Surgical History:  Procedure Laterality Date   COLONOSCOPY     polyps;granular ileitis 2006;2007 normal, Dr Sharlett Iles   COLONOSCOPY WITH PROPOFOL  07/2016   Dr.Deval Mroczka   HEMORRHOID SURGERY     OPERATIVE HYSTEROSCOPY  08/05/2004   Dr Garwin Brothers   OVARIAN CYST REMOVAL     POLYPECTOMY     UPPER GASTROINTESTINAL ENDOSCOPY      Prior to Admission medications   Medication Sig Start Date End Date Taking? Authorizing Provider  acetaminophen (TYLENOL) 500 MG tablet Take 250-500 mg by mouth every  6 (six) hours as needed for headache (pain).   Yes [provider]  aspirin EC 81 MG tablet Take 1 tablet (81 mg total) by mouth daily. Swallow whole. 12/09/21  Yes Domenic Polite, MD  atenolol (TENORMIN) 25 MG tablet TAKE 1 TABLET BY MOUTH DAILY Patient taking differently: Take 25 mg by mouth daily. 11/13/21  Yes Burns, Claudina Lick, MD  Calcium Carbonate-Vitamin D (CALTRATE 600+D PO) Take 1 tablet by mouth daily.    Yes [provider]  cholecalciferol (VITAMIN D) 1000 units tablet Take 1,000 Units by mouth daily.   Yes [provider]  fexofenadine (ALLEGRA) 180 MG tablet Take 180 mg by mouth daily.   Yes [provider]  Lactase (LACTAID PO) Take 1 tablet by mouth 2 (two) times daily as needed (stomach upset/ gas).    Yes [provider]  Multiple Vitamin (MULTIVITAMIN WITH MINERALS) TABS tablet Take 1 tablet by mouth daily.   Yes [provider]  Na Sulfate-K Sulfate-Mg Sulf 17.5-3.13-1.6 GM/177ML SOLN Take 1 kit by mouth as directed. May use generic Suprep, no prior authorization. Take as directed. 12/23/21  Yes Dayne Chait, Lajuan Lines, MD  Propylene Glycol (SYSTANE COMPLETE OP) Place 1 drop into  both eyes at bedtime.   Yes [provider]  rosuvastatin (CRESTOR) 5 MG tablet Take 1 tablet (5 mg total) by mouth daily. 12/18/21  Yes Burns, Claudina Lick, MD  telmisartan (MICARDIS) 80 MG tablet TAKE 1 TABLET BY MOUTH  DAILY Patient taking differently: Take 80 mg by mouth daily. 08/24/21  Yes Burns, Claudina Lick, MD  betamethasone valerate ointment (VALISONE) 0.1 % Apply a pea sized amount BID for up to 2 weeks as needed Patient taking differently: Apply 1 Application topically 2 (two) times daily as needed (irritation). 10/19/21   Salvadore Dom, MD  Fluticasone Propionate (FLONASE NA) Place 1 spray into the nose daily as needed (congestion).    [provider]  hydrocortisone (ANUSOL-HC) 25 MG suppository Place 25 mg rectally as needed for hemorrhoids  or anal itching.    [provider]    Current Outpatient Medications  Medication Sig Dispense Refill   acetaminophen (TYLENOL) 500 MG tablet Take 250-500 mg by mouth every 6 (six) hours as needed for headache (pain).     aspirin EC 81 MG tablet Take 1 tablet (81 mg total) by mouth daily. Swallow whole. 30 tablet 12   atenolol (TENORMIN) 25 MG tablet TAKE 1 TABLET BY MOUTH DAILY (Patient taking differently: Take 25 mg by mouth daily.) 90 tablet 3   Calcium Carbonate-Vitamin D (CALTRATE 600+D PO) Take 1 tablet by mouth daily.      cholecalciferol (VITAMIN D) 1000 units tablet Take 1,000 Units by mouth daily.     fexofenadine (ALLEGRA) 180 MG tablet Take 180 mg by mouth daily.     Lactase (LACTAID PO) Take 1 tablet by mouth 2 (two) times daily as needed (stomach upset/ gas).      Multiple Vitamin (MULTIVITAMIN WITH MINERALS) TABS tablet Take 1 tablet by mouth daily.     Na Sulfate-K Sulfate-Mg Sulf 17.5-3.13-1.6 GM/177ML SOLN Take 1 kit by mouth as directed. May use generic Suprep, no prior authorization. Take as directed. 354 mL 0   Propylene Glycol (SYSTANE COMPLETE OP) Place 1 drop into both eyes at bedtime.     rosuvastatin (CRESTOR) 5 MG tablet Take 1 tablet (5 mg total) by mouth daily. 90 tablet 1   telmisartan (MICARDIS) 80 MG tablet TAKE 1 TABLET BY MOUTH  DAILY (Patient taking differently: Take 80 mg by mouth daily.) 90 tablet 1   betamethasone valerate ointment (VALISONE) 0.1 % Apply a pea sized amount BID for up to 2 weeks as needed (Patient taking differently: Apply 1 Application topically 2 (two) times daily as needed (irritation).) 30 g 0   Fluticasone Propionate (FLONASE NA) Place 1 spray into the nose daily as needed (congestion).     hydrocortisone (ANUSOL-HC) 25 MG suppository Place 25 mg rectally as needed for hemorrhoids or anal itching.     Current Facility-Administered Medications  Medication Dose Route Frequency Provider Last Rate Last Admin   0.9 %  sodium  chloride infusion  500 mL Intravenous Once Dijon Kohlman, Lajuan Lines, MD        Allergies as of 01/13/2022 - Review Complete 01/13/2022  Allergen Reaction Noted   Cefprozil Hives    Erythromycin Hives 11/13/2007   Esomeprazole magnesium Hives    Penicillins Hives and Rash 11/13/2007   Sulfonamide derivatives Hives 11/13/2007   Amlodipine Other (See Comments) 03/17/2016   Crestor [rosuvastatin calcium] Itching 01/12/2017   Crestor [rosuvastatin] Itching and Other (See Comments) 10/06/2018   Erythromycin base Hives 03/20/2018   Hydrochlorothiazide Other (See Comments) 02/26/2016  Pravastatin Other (See Comments) 01/21/2017   Sulfa antibiotics Hives 03/20/2018   Atorvastatin Other (See Comments) 12/08/2016   Doxycycline Nausea And Vomiting 01/15/2011   Zetia [ezetimibe] Rash 10/31/2019    Family History  Problem Relation Age of Onset   Diabetes Mother    Hypertension Mother    Heart disease Mother    Lymphoma Father    Diabetes Brother        CAD; Dialysis   Hypertension Brother    Colon cancer Maternal Grandmother    Rectal cancer Paternal Grandmother    Penile cancer Paternal Grandfather    Heart attack Other        1/2 maternal aunts X 2   Cancer Other    COPD Neg Hx    Esophageal cancer Neg Hx    Stomach cancer Neg Hx    Stroke Neg Hx    Colon polyps Neg Hx     Social History   Socioeconomic History   Marital status: Single    Spouse name: Not on file   Number of children: 0   Years of education: Not on file   Highest education level: Not on file  Occupational History   Occupation: TESTER    Employer: [employer]  Tobacco Use   Smoking status: Never   Smokeless tobacco: Never  Vaping Use   Vaping Use: Never used  Substance and Sexual Activity   Alcohol use: No   Drug use: No   Sexual activity: Not Currently    Birth control/protection: Post-menopausal  Other Topics Concern   Not on file  Social History Narrative   Right handed      Some College    Social Determinants of Health   Financial Resource Strain: Low Risk  (02/08/2020)   Overall Financial Resource Strain (CARDIA)    Difficulty of Paying Living Expenses: Not hard at all  Food Insecurity: No Food Insecurity (02/08/2020)   Hunger Vital Sign    Worried About Running Out of Food in the Last Year: Never true    Ran Out of Food in the Last Year: Never true  Transportation Needs: No Transportation Needs (02/08/2020)   PRAPARE - Hydrologist (Medical): No    Lack of Transportation (Non-Medical): No  Physical Activity: Insufficiently Active (02/08/2020)   Exercise Vital Sign    Days of Exercise per Week: 3 days    Minutes of Exercise per Session: 30 min  Stress: Stress Concern Present (02/08/2020)   [place]    Feeling of Stress : To some extent  Social Connections: Socially Isolated (02/08/2020)   Social Connection and Isolation Panel [NHANES]    Frequency of Communication with Friends and Family: More than three times a week    Frequency of Social Gatherings with Friends and Family: Never    Attends Religious Services: Never    Marine scientist or Organizations: No    Attends Music therapist: Never    Marital Status: Never married  Human resources officer Violence: Not on file    Physical Exam: Vital signs in last 24 hours: @BP  (!) 170/68   Pulse 60   Temp (!) 96.9 F (36.1 C) (Temporal)   Ht 5' 3"  (1.6 m)   Wt 149 lb (67.6 kg)   SpO2 100%   BMI 26.39 kg/m  GEN: NAD EYE: Sclerae anicteric ENT: MMM CV: Non-tachycardic Pulm: CTA b/l GI: Soft, NT/ND NEURO:  Alert &  Oriented x 3   Zenovia Jarred, MD [facility]  01/13/2022 9:41 AM

## 2022-01-14 ENCOUNTER — Telehealth: Payer: Self-pay

## 2022-01-14 NOTE — Telephone Encounter (Signed)
  Follow up Call-     01/13/2022    8:58 AM  Call back number  Post procedure Call Back phone  # [phone]  Permission to leave phone message Yes    Post op call attempted, no answer, left WM.

## 2022-01-25 ENCOUNTER — Encounter: Payer: Self-pay | Admitting: Internal Medicine

## 2022-01-25 ENCOUNTER — Other Ambulatory Visit: Payer: Self-pay | Admitting: Internal Medicine

## 2022-01-25 DIAGNOSIS — Z1231 Encounter for screening mammogram for malignant neoplasm of breast: Secondary | ICD-10-CM

## 2022-01-27 ENCOUNTER — Telehealth: Payer: Self-pay | Admitting: Internal Medicine

## 2022-01-27 NOTE — Telephone Encounter (Signed)
Pt is requesting a referral to have her mammogram done somewhere other than [facility]. She did not like the facility or services.   Please advise on where else the pt can have her mammogram done.

## 2022-02-01 NOTE — Telephone Encounter (Signed)
Patient called back and is still asking about her mammogram and her referral to a cardiologist.  Please advise.

## 2022-02-02 NOTE — Telephone Encounter (Signed)
I do not recall discussing her seeing a cardiologist - ???

## 2022-02-02 NOTE — Telephone Encounter (Signed)
Message left for patient today with needing reason for cardiologist since this hasn't been discussed.

## 2022-02-25 ENCOUNTER — Ambulatory Visit
Admission: RE | Admit: 2022-02-25 | Discharge: 2022-02-25 | Disposition: A | Discharge status: Home / Self Care | Payer: Medicare Other | Source: Ambulatory Visit | Attending: Internal Medicine | Admitting: Internal Medicine

## 2022-02-25 DIAGNOSIS — Z1231 Encounter for screening mammogram for malignant neoplasm of breast: Secondary | ICD-10-CM | POA: Diagnosis not present

## 2022-03-02 ENCOUNTER — Ambulatory Visit: Payer: Medicare Other | Admitting: Physician Assistant

## 2022-03-02 NOTE — Progress Notes (Deleted)
Assessment/Plan:   Mild Cognitive Impairment   Karen Evans is a very pleasant 71 y.o. RH female presenting today in follow-up for evaluation of memory loss. Patient is on *** MMSE today is   /30  with delayed recall  /3     Recommendations:   Follow up in   months.    Subjective:   This patient is accompanied in the office by ***  who supplements the history. Previous records as well as any outside records available were reviewed prior to todays visit.  ***    Any changes in memory since last visit? repeats oneself?  Endorsed Disoriented when walking into a room?  Patient denies   Leaving objects in unusual places?  Patient denies   Ambulates  with difficulty?   Patient denies   Recent falls?  Patient denies   Any head injuries?  Patient denies   History of seizures?   Patient denies   Wandering behavior?  Patient denies   Patient drives?  *** Any mood changes since last visit?  Patient denies   Any worsening depression?:  Patient denies   Hallucinations?  Patient denies   Paranoia?  Patient denies   Patient reports that sleeps well without vivid dreams, REM behavior or sleepwalking   History of sleep apnea?  Patient denies   Any hygiene concerns?  Patient denies   Independent of bathing and dressing?  Endorsed  Does the patient needs help with medications?  In charge *** Who is in charge of the finances?   is in charge   *** Any changes in appetite?  Patient denies ***   Patient have trouble swallowing? Patient denies   Does the patient cook?  Patient denies   Any kitchen accidents such as leaving the stove on? Patient denies   Any headaches?  Patient denies   Double vision? Patient denies   Any focal numbness or tingling?  Patient denies   Chronic back pain Patient denies   Unilateral weakness?  Patient denies   Any tremors?  Patient denies   Any history of anosmia?  Patient denies   Any incontinence of urine?  Patient denies   Any bowel dysfunction?    Patient denies      Patient lives  ***  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   Heart murmur    Hiatal hernia 2002   EGD   Hypertension    Internal hemorrhoids    Migraines    when takes vitamins    Rectal polyp 01/24/2012   TUBULAR ADENOMA   Reflux esophagitis 2007   EGD   Stricture and stenosis of esophagus 2007   EGD   Tubular adenoma of colon    Ulcer    Vitamin D deficiency      Past Surgical History:  Procedure Laterality Date   COLONOSCOPY     polyps;granular ileitis 2006;2007 normal, Dr Sharlett Iles   COLONOSCOPY WITH PROPOFOL  07/2016   Dr.Pyrtle   HEMORRHOID SURGERY     OPERATIVE HYSTEROSCOPY  08/05/2004   Dr Garwin Brothers   OVARIAN CYST REMOVAL  POLYPECTOMY     UPPER GASTROINTESTINAL ENDOSCOPY       PREVIOUS MEDICATIONS:   CURRENT MEDICATIONS:  Outpatient Encounter Medications as of 03/02/2022  Medication Sig   acetaminophen (TYLENOL) 500 MG tablet Take 250-500 mg by mouth every 6 (six) hours as needed for headache (pain).   aspirin EC 81 MG tablet Take 1 tablet (81 mg total) by mouth daily. Swallow whole.   atenolol (TENORMIN) 25 MG tablet TAKE 1 TABLET BY MOUTH DAILY (Patient taking differently: Take 25 mg by mouth daily.)   betamethasone valerate ointment (VALISONE) 0.1 % Apply a pea sized amount BID for up to 2 weeks as needed (Patient taking differently: Apply 1 Application topically 2 (two) times daily as needed (irritation).)   Calcium Carbonate-Vitamin D (CALTRATE 600+D PO) Take 1 tablet by mouth daily.    cholecalciferol (VITAMIN D) 1000 units tablet Take 1,000 Units by mouth daily.   fexofenadine (ALLEGRA) 180 MG tablet Take 180 mg by mouth daily.   Fluticasone Propionate (FLONASE NA) Place 1  spray into the nose daily as needed (congestion).   hydrocortisone (ANUSOL-HC) 25 MG suppository Place 25 mg rectally as needed for hemorrhoids or anal itching.   Lactase (LACTAID PO) Take 1 tablet by mouth 2 (two) times daily as needed (stomach upset/ gas).    Multiple Vitamin (MULTIVITAMIN WITH MINERALS) TABS tablet Take 1 tablet by mouth daily.   Na Sulfate-K Sulfate-Mg Sulf 17.5-3.13-1.6 GM/177ML SOLN Take 1 kit by mouth as directed. May use generic Suprep, no prior authorization. Take as directed.   Propylene Glycol (SYSTANE COMPLETE OP) Place 1 drop into both eyes at bedtime.   rosuvastatin (CRESTOR) 5 MG tablet Take 1 tablet (5 mg total) by mouth daily.   telmisartan (MICARDIS) 80 MG tablet TAKE 1 TABLET BY MOUTH  DAILY (Patient taking differently: Take 80 mg by mouth daily.)   No facility-administered encounter medications on file as of 03/02/2022.     Objective:     PHYSICAL EXAMINATION:    VITALS:  There were no vitals filed for this visit.  GEN:  The patient appears stated age and is in NAD. HEENT:  Normocephalic, atraumatic.   Neurological examination:  General: NAD, well-groomed, appears stated age. Orientation: The patient is alert. Oriented to person, place and date Cranial nerves: There is good facial symmetry.The speech is fluent and clear. No aphasia or dysarthria. Fund of knowledge is appropriate. Recent memory impaired and remote memory is normal.  Attention and concentration are normal.  Able to name objects and repeat phrases.  Hearing is intact to conversational tone.    Sensation: Sensation is intact to light touch throughout Motor: Strength is at least antigravity x4. Tremors: none  DTR's 2/4 in UE/LE      08/13/2019    2:00 PM 12/20/2018    3:00 PM 06/07/2018    9:00 AM  Montreal Cognitive Assessment   Visuospatial/ Executive (0/5) 2 4 4   Naming (0/3) 3 3 3   Attention: Read list of digits (0/2) 0 1 2  Attention: Read list of letters (0/1) 0 1 1   Attention: Serial 7 subtraction starting at 100 (0/3) 0 1 0  Language: Repeat phrase (0/2) 0 2 2  Language : Fluency (0/1) 0 0 0  Abstraction (0/2) 1 2 0  Delayed Recall (0/5) 4 0 3  Orientation (0/6) 6 6 6   Total 16 20 21   Adjusted Score (based on education) 16         08/31/2021    3:00  PM 08/26/2020   11:00 AM  MMSE - Mini Mental State Exam  Orientation to time 5 5  Orientation to Place 5 5  Registration 3 3  Attention/ Calculation 5 0  Recall 3 3  Language- name 2 objects 2 2  Language- repeat 1 1  Language- follow 3 step command 3 3  Language- read & follow direction 1 1  Write a sentence 1 1  Copy design 1 1  Total score 30 25       Movement examination: Tone: There is normal tone in the UE/LE Abnormal movements:  no tremor.  No myoclonus.  No asterixis.   Coordination:  There is no decremation with RAM's. Normal finger to nose  Gait and Station: The patient has no difficulty arising out of a deep-seated chair without the use of the hands. The patient's stride length is good.  Gait is cautious and narrow.   Thank you for allowing [place] the opportunity to participate in the care of this nice patient. Please do not hesitate to contact us for any questions or concerns.   Total time spent on today's visit was *** minutes dedicated to this patient today, preparing to see patient, examining the patient, ordering tests and/or medications and counseling the patient, documenting clinical information in the EHR or other health record, independently interpreting results and communicating results to the patient/family, discussing treatment and goals, answering patient's questions and coordinating care.  Cc:  Binnie Rail, MD  Sharene Butters 03/02/2022 3:02 PM

## 2022-03-11 ENCOUNTER — Other Ambulatory Visit: Payer: Self-pay | Admitting: Internal Medicine

## 2022-04-05 ENCOUNTER — Telehealth: Payer: Self-pay | Admitting: Internal Medicine

## 2022-04-05 ENCOUNTER — Other Ambulatory Visit: Payer: Self-pay

## 2022-04-05 MED ORDER — TELMISARTAN 80 MG PO TABS: 80.0000 mg | ORAL_TABLET | Freq: Every day | ORAL | 0 refills | Status: DC

## 2022-04-05 NOTE — Telephone Encounter (Signed)
Patient called back

## 2022-04-05 NOTE — Telephone Encounter (Signed)
Patient called and said that she had went out of [place] to [facility] and she forgot her telmisartan (MICARDIS) 80 MG tablet and wanted to know if a 10d/s could be sent to [address] Phone:[phone]. Call back for patient is [phone]

## 2022-04-05 NOTE — Telephone Encounter (Signed)
Sent!

## 2022-04-12 ENCOUNTER — Encounter: Payer: Self-pay | Admitting: Internal Medicine

## 2022-04-12 NOTE — Progress Notes (Deleted)
Subjective:    Patient ID: Karen Evans, female    DOB: 1951-02-01, 71 y.o.   MRN: [id number]     HPI Arelene is here for follow up of her chronic medical problems, including htn, hld, prediabetes, mild vasc neurocognitive d/o    Medications and allergies reviewed with patient and updated if appropriate.  Current Outpatient Medications on File Prior to Visit  Medication Sig Dispense Refill   acetaminophen (TYLENOL) 500 MG tablet Take 250-500 mg by mouth every 6 (six) hours as needed for headache (pain).     aspirin EC 81 MG tablet Take 1 tablet (81 mg total) by mouth daily. Swallow whole. 30 tablet 12   atenolol (TENORMIN) 25 MG tablet TAKE 1 TABLET BY MOUTH DAILY (Patient taking differently: Take 25 mg by mouth daily.) 90 tablet 3   betamethasone valerate ointment (VALISONE) 0.1 % Apply a pea sized amount BID for up to 2 weeks as needed (Patient taking differently: Apply 1 Application topically 2 (two) times daily as needed (irritation).) 30 g 0   Calcium Carbonate-Vitamin D (CALTRATE 600+D PO) Take 1 tablet by mouth daily.      cholecalciferol (VITAMIN D) 1000 units tablet Take 1,000 Units by mouth daily.     fexofenadine (ALLEGRA) 180 MG tablet Take 180 mg by mouth daily.     Fluticasone Propionate (FLONASE NA) Place 1 spray into the nose daily as needed (congestion).     hydrocortisone (ANUSOL-HC) 25 MG suppository Place 25 mg rectally as needed for hemorrhoids or anal itching.     Lactase (LACTAID PO) Take 1 tablet by mouth 2 (two) times daily as needed (stomach upset/ gas).      Multiple Vitamin (MULTIVITAMIN WITH MINERALS) TABS tablet Take 1 tablet by mouth daily.     Na Sulfate-K Sulfate-Mg Sulf 17.5-3.13-1.6 GM/177ML SOLN Take 1 kit by mouth as directed. May use generic Suprep, no prior authorization. Take as directed. 354 mL 0   Propylene Glycol (SYSTANE COMPLETE OP) Place 1 drop into both eyes at bedtime.     rosuvastatin (CRESTOR) 5 MG tablet Take 1 tablet (5 mg  total) by mouth daily. 90 tablet 1   telmisartan (MICARDIS) 80 MG tablet Take 1 tablet (80 mg total) by mouth daily. 30 tablet 0   No current facility-administered medications on file prior to visit.     Review of Systems     Objective:  There were no vitals filed for this visit. BP Readings from Last 3 Encounters:  01/13/22 (!) 112/54  12/18/21 130/80  12/10/21 114/89   Wt Readings from Last 3 Encounters:  01/13/22 149 lb (67.6 kg)  12/23/21 149 lb (67.6 kg)  12/18/21 149 lb (67.6 kg)   There is no height or weight on file to calculate BMI.    Physical Exam     Lab Results  Component Value Date   WBC 6.2 12/08/2021   HGB 15.6 (H) 12/08/2021   HCT 46.0 12/08/2021   PLT 218 12/08/2021   GLUCOSE 91 12/08/2021   CHOL 208 (H) 12/09/2021   TRIG 134 12/09/2021   HDL 70 12/09/2021   LDLDIRECT 139.0 02/09/2021   LDLCALC 111 (H) 12/09/2021   ALT 18 12/08/2021   AST 18 12/08/2021   NA 138 12/08/2021   K 4.0 12/08/2021   CL 104 12/08/2021   CREATININE 0.80 12/08/2021   BUN 11 12/08/2021   CO2 25 12/08/2021   TSH 0.769 12/09/2021   INR 1.0 12/08/2021  HGBA1C 5.7 (H) 12/09/2021     Assessment & Plan:    See Problem List for Assessment and Plan of chronic medical problems.

## 2022-04-13 ENCOUNTER — Ambulatory Visit: Payer: Medicare Other | Admitting: Internal Medicine

## 2022-04-13 DIAGNOSIS — E7849 Other hyperlipidemia: Secondary | ICD-10-CM

## 2022-04-13 DIAGNOSIS — I999 Unspecified disorder of circulatory system: Secondary | ICD-10-CM

## 2022-04-13 DIAGNOSIS — R7303 Prediabetes: Secondary | ICD-10-CM

## 2022-04-13 DIAGNOSIS — I10 Essential (primary) hypertension: Secondary | ICD-10-CM

## 2022-04-13 NOTE — Assessment & Plan Note (Deleted)
Chronic Regular exercise and healthy diet encouraged Check lipid panel  Continue Crestor 5 mg daily 

## 2022-04-13 NOTE — Assessment & Plan Note (Deleted)
Chronic Blood pressure well controlled CMP Continue atenolol 25 mg daily, telmisartan 80 mg daily

## 2022-04-13 NOTE — Assessment & Plan Note (Deleted)
Chronic Check a1c Low sugar / carb diet Stressed regular exercise  

## 2022-04-13 NOTE — Assessment & Plan Note (Deleted)
Chronic Following with Dr. Delice Lesch Was prescribed Aricept, but stopped taking it secondary to vivid dreams Encouraged regular exercise, healthy diet, doing brain exercises such as crossword's

## 2022-04-15 ENCOUNTER — Other Ambulatory Visit: Payer: Self-pay | Admitting: Internal Medicine

## 2022-04-22 ENCOUNTER — Ambulatory Visit: Payer: Medicare Other | Admitting: Podiatry

## 2022-04-22 ENCOUNTER — Encounter: Payer: Self-pay | Admitting: Podiatry

## 2022-04-22 DIAGNOSIS — B351 Tinea unguium: Secondary | ICD-10-CM | POA: Diagnosis not present

## 2022-04-22 DIAGNOSIS — M79674 Pain in right toe(s): Secondary | ICD-10-CM

## 2022-04-22 DIAGNOSIS — M79675 Pain in left toe(s): Secondary | ICD-10-CM

## 2022-04-23 NOTE — Progress Notes (Signed)
Subjective:   Patient ID: Karen Evans, female   DOB: 71 y.o.   MRN: [id number]   HPI Patient presents with severely elongated nailbeds 1-5 both feet that she has not had cut for a long time.  States they do get sore due to the length and thickness and patient does not smoke tries to be active   Review of Systems  All other systems reviewed and are negative.       Objective:  Physical Exam Vitals and nursing note reviewed.  Constitutional:      Appearance: She is well-developed.  Pulmonary:     Effort: Pulmonary effort is normal.  Musculoskeletal:        General: Normal range of motion.  Skin:    General: Skin is warm.  Neurological:     Mental Status: She is alert.     Neurovascular status intact muscle strength was found to be adequate range of motion adequate with elongated thickened nailbeds 1-5 both feet that are dystrophic painful when pressed.  Patient is found to have good digital perfusion well-oriented     Assessment:  Chronic mycotic nail infection with pain 1-5 both feet painful     Plan:  H&P done education rendered concerning condition deep debridement with sterile instrumentation 1-5 both feet no iatrogenic bleeding reappoint for routine care as needed and she also can get pedicures

## 2022-04-29 DIAGNOSIS — D225 Melanocytic nevi of trunk: Secondary | ICD-10-CM | POA: Diagnosis not present

## 2022-04-29 DIAGNOSIS — L814 Other melanin hyperpigmentation: Secondary | ICD-10-CM | POA: Diagnosis not present

## 2022-04-29 DIAGNOSIS — L853 Xerosis cutis: Secondary | ICD-10-CM | POA: Diagnosis not present

## 2022-04-29 DIAGNOSIS — L821 Other seborrheic keratosis: Secondary | ICD-10-CM | POA: Diagnosis not present

## 2022-05-30 ENCOUNTER — Encounter: Payer: Self-pay | Admitting: Internal Medicine

## 2022-05-30 NOTE — Progress Notes (Unsigned)
Subjective:    Patient ID: Karen Evans, female    DOB: [birth year], 72 y.o.   MRN: [id number]     HPI Karen Evans is here for follow up of her chronic medical problems, including htn, hld, prediabetes, mild vascular neurocognitive d/o  She does not always take her statin daily or the ASA.  She thought the statin cause hives at one point.   No concerns.  Medications and allergies reviewed with patient and updated if appropriate.  Current Outpatient Medications on File Prior to Visit  Medication Sig Dispense Refill   acetaminophen (TYLENOL) 500 MG tablet Take 250-500 mg by mouth every 6 (six) hours as needed for headache (pain).     aspirin EC 81 MG tablet Take 1 tablet (81 mg total) by mouth daily. Swallow whole. 30 tablet 12   atenolol (TENORMIN) 25 MG tablet TAKE 1 TABLET BY MOUTH DAILY (Patient taking differently: Take 25 mg by mouth daily.) 90 tablet 3   betamethasone valerate ointment (VALISONE) 0.1 % Apply a pea sized amount BID for up to 2 weeks as needed (Patient taking differently: Apply 1 Application topically 2 (two) times daily as needed (irritation).) 30 g 0   Calcium Carbonate-Vitamin D (CALTRATE 600+D PO) Take 1 tablet by mouth daily.      cholecalciferol (VITAMIN D) 1000 units tablet Take 1,000 Units by mouth daily.     fexofenadine (ALLEGRA) 180 MG tablet Take 180 mg by mouth daily.     Fluticasone Propionate (FLONASE NA) Place 1 spray into the nose daily as needed (congestion).     hydrocortisone (ANUSOL-HC) 25 MG suppository Place 25 mg rectally as needed for hemorrhoids or anal itching.     Lactase (LACTAID PO) Take 1 tablet by mouth 2 (two) times daily as needed (stomach upset/ gas).      Multiple Vitamin (MULTIVITAMIN WITH MINERALS) TABS tablet Take 1 tablet by mouth daily.     Na Sulfate-K Sulfate-Mg Sulf 17.5-3.13-1.6 GM/177ML SOLN Take 1 kit by mouth as directed. May use generic Suprep, no prior authorization. Take as directed. 354 mL 0   Propylene Glycol  (SYSTANE COMPLETE OP) Place 1 drop into both eyes at bedtime.     rosuvastatin (CRESTOR) 5 MG tablet TAKE 1 TABLET BY MOUTH 3 TIMES  WEEKLY 39 tablet 3   telmisartan (MICARDIS) 80 MG tablet Take 1 tablet (80 mg total) by mouth daily. 30 tablet 0   No current facility-administered medications on file prior to visit.     Review of Systems  Constitutional:  Negative for chills and fever.  Respiratory:  Positive for cough (mild). Negative for shortness of breath and wheezing.   Cardiovascular:  Negative for chest pain, palpitations and leg swelling.  Neurological:  Negative for light-headedness and headaches.       Objective:   Vitals:   05/31/22 1429  BP: 138/84  Pulse: (!) 55  Temp: 98.7 F (37.1 C)  SpO2: 99%   BP Readings from Last 3 Encounters:  05/31/22 138/84  01/13/22 (!) 112/54  12/18/21 130/80   Wt Readings from Last 3 Encounters:  05/31/22 153 lb (69.4 kg)  01/13/22 149 lb (67.6 kg)  12/23/21 149 lb (67.6 kg)   Body mass index is 27.1 kg/m.    Physical Exam Constitutional:      General: She is not in acute distress.    Appearance: Normal appearance.  HENT:     Head: Normocephalic and atraumatic.  Eyes:     Conjunctiva/sclera:  Conjunctivae normal.  Cardiovascular:     Rate and Rhythm: Normal rate and regular rhythm.     Heart sounds: Normal heart sounds. No murmur heard. Pulmonary:     Effort: Pulmonary effort is normal. No respiratory distress.     Breath sounds: Normal breath sounds. No wheezing.  Musculoskeletal:     Cervical back: Neck supple.     Right lower leg: No edema.     Left lower leg: No edema.  Lymphadenopathy:     Cervical: No cervical adenopathy.  Skin:    General: Skin is warm and dry.     Findings: No rash.  Neurological:     Mental Status: She is alert. Mental status is at baseline.  Psychiatric:        Mood and Affect: Mood normal.        Behavior: Behavior normal.        Lab Results  Component Value Date   WBC 6.2  12/08/2021   HGB 15.6 (H) 12/08/2021   HCT 46.0 12/08/2021   PLT 218 12/08/2021   GLUCOSE 91 12/08/2021   CHOL 208 (H) 12/09/2021   TRIG 134 12/09/2021   HDL 70 12/09/2021   LDLDIRECT 139.0 02/09/2021   LDLCALC 111 (H) 12/09/2021   ALT 18 12/08/2021   AST 18 12/08/2021   NA 138 12/08/2021   K 4.0 12/08/2021   CL 104 12/08/2021   CREATININE 0.80 12/08/2021   BUN 11 12/08/2021   CO2 25 12/08/2021   TSH 0.769 12/09/2021   INR 1.0 12/08/2021   HGBA1C 5.7 (H) 12/09/2021     Assessment & Plan:    See Problem List for Assessment and Plan of chronic medical problems.

## 2022-05-30 NOTE — Patient Instructions (Signed)
      Blood work was ordered.   The lab is on the first floor.    Medications changes include :   none      Return in about 6 months (around 11/29/2022) for follow up.

## 2022-05-31 ENCOUNTER — Ambulatory Visit (INDEPENDENT_AMBULATORY_CARE_PROVIDER_SITE_OTHER): Payer: Medicare Other | Admitting: Internal Medicine

## 2022-05-31 ENCOUNTER — Encounter: Payer: Self-pay | Admitting: Internal Medicine

## 2022-05-31 VITALS — BP 138/84 | HR 55 | Temp 98.7°F | Ht 63.0 in | Wt 153.0 lb

## 2022-05-31 DIAGNOSIS — G459 Transient cerebral ischemic attack, unspecified: Secondary | ICD-10-CM | POA: Diagnosis not present

## 2022-05-31 DIAGNOSIS — R7303 Prediabetes: Secondary | ICD-10-CM

## 2022-05-31 DIAGNOSIS — E7849 Other hyperlipidemia: Secondary | ICD-10-CM | POA: Diagnosis not present

## 2022-05-31 DIAGNOSIS — I999 Unspecified disorder of circulatory system: Secondary | ICD-10-CM | POA: Diagnosis not present

## 2022-05-31 DIAGNOSIS — I10 Essential (primary) hypertension: Secondary | ICD-10-CM | POA: Diagnosis not present

## 2022-05-31 DIAGNOSIS — F0670 Mild neurocognitive disorder due to known physiological condition without behavioral disturbance: Secondary | ICD-10-CM

## 2022-05-31 LAB — COMPREHENSIVE METABOLIC PANEL
ALT: 31 U/L (ref 0–35)
AST: 23 U/L (ref 0–37)
Albumin: 4 g/dL (ref 3.5–5.2)
Alkaline Phosphatase: 72 U/L (ref 39–117)
BUN: 12 mg/dL (ref 6–23)
CO2: 30 mEq/L (ref 19–32)
Calcium: 10.8 mg/dL — ABNORMAL HIGH (ref 8.4–10.5)
Chloride: 102 mEq/L (ref 96–112)
Creatinine, Ser: 0.75 mg/dL (ref 0.40–1.20)
GFR: 81.18 mL/min (ref >60.00)
Glucose, Bld: 87 mg/dL (ref 70–99)
Potassium: 4.1 mEq/L (ref 3.5–5.1)
Sodium: 139 mEq/L (ref 135–145)
Total Bilirubin: 0.4 mg/dL (ref 0.2–1.2)
Total Protein: 7.5 g/dL (ref 6.0–8.3)

## 2022-05-31 LAB — LIPID PANEL
Cholesterol: 206 mg/dL — ABNORMAL HIGH (ref 0–200)
HDL: 68.6 mg/dL (ref >39.00)
LDL Cholesterol: 109 mg/dL — ABNORMAL HIGH (ref 0–99)
NonHDL: 137.89
Total CHOL/HDL Ratio: 3
Triglycerides: 145 mg/dL (ref 0.0–149.0)
VLDL: 29 mg/dL (ref 0.0–40.0)

## 2022-05-31 LAB — HEMOGLOBIN A1C: Hgb A1c MFr Bld: 6.4 % (ref 4.6–6.5)

## 2022-05-31 NOTE — Assessment & Plan Note (Signed)
Chronic Check lipid panel  Continue crestor 5 mg three times a week - ideally daily Regular exercise and healthy diet encouraged

## 2022-05-31 NOTE — Assessment & Plan Note (Signed)
Chronic Following with neurology Not currently on any medication Encouraged regular exercise

## 2022-05-31 NOTE — Assessment & Plan Note (Signed)
Chronic A little higher than ideal Encouraged her to monitor Bp at home Cmp Continue atenolol 25 mg daily, telmisartan 80 mg daily

## 2022-05-31 NOTE — Assessment & Plan Note (Signed)
Possible TIA in the past Stressed taking ASA 81 mg daily and crestor at least three times a day

## 2022-05-31 NOTE — Assessment & Plan Note (Signed)
Chronic Check a1c Low sugar / carb diet Stressed regular exercise   

## 2022-06-01 ENCOUNTER — Telehealth: Payer: Self-pay | Admitting: Internal Medicine

## 2022-06-01 NOTE — Telephone Encounter (Signed)
Results given to patient today and letter mailed out with results.

## 2022-06-01 NOTE — Telephone Encounter (Signed)
Patient called requesting a callback from Dr. Jenness Corner nurse. Patient wants to know her results from the blood work she had yesterday. Best callback number for patient is [phone].

## 2022-06-07 NOTE — Progress Notes (Incomplete)
Virtual Visit via Video Note The purpose of this virtual visit is to provide medical care in a patient that is unable to be seen in person due to physical or health limitations   Consent was obtained for video visit:  yes  Answered questions that patient had about telehealth interaction:  yes I discussed the limitations, risks, security and privacy concerns of performing an evaluation and management service by telemedicine. I also discussed with the patient that there may be a patient responsible charge related to this service. The patient expressed understanding and agreed to proceed.  Pt location: Home Physician Location: office Name of referring provider:  Binnie Rail, MD I connected with Karen Evans at patients initiation/request on 06/08/2022 at  3:00 PM EST by video enabled telemedicine application and verified that I am speaking with the correct person using two identifiers. Pt MRN:  [id number] Pt DOB:  [birth year] Video Participants:  Karen Hoff Casino;  ***    Assessment and Plan:    Karen Evans  is a 72 y.o. RH female with a history of HTN, HLD, Prediabetes, recent admission for TIA symptoms versus complex migraine 11/2021 without recurrence and with negative neuro workup,  seen today in follow up via video visit for evaluation of memory loss. In the past, she did not tolerate Aricept due to very vivid dreams. Last MMSE was 30/30  (08/2021 .  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  Continue baby ASA daily.  Continue to control mood as per PCP    History of Present Illness: ***   Any changes in memory since last visit? Enjoys drawing, painting, attending [employer], and attending [employer] *** repeats oneself?  Endorsed Disoriented when walking into a room?  Patient denies except occasionally not remembering what patient came to the room for ***  Leaving objects in unusual places?  Patient  denies   Wandering behavior?   denies   Any personality changes since last visit?   denies   Any worsening depression?: denies   Hallucinations or paranoia?  denies   Seizures?   denies    Any sleep changes?  Denies  vivid dreams, REM behavior or sleepwalking   Sleep apnea?   denies   Any hygiene concerns?   denies   Independent of bathing and dressing?  Endorsed  Does the patient needs help with medications? is in charge *** Who is in charge of the finances?   is in charge   *** Any changes in appetite?  denies ***   Patient have trouble swallowing?  denies   Does the patient cook?  Any kitchen accidents such as leaving the stove on?   denies   Any headaches?    denies   Vision changes? denies Chronic back pain  denies   Ambulates with difficulty?    denies   Recent falls or head injuries?    denies     Unilateral weakness, numbness or tingling? In July 2023 she was brought to the ED as she was prepping for colonoscopy, with L facial droop and L arm and L LE tingling, lasting 30 mins, self resolving. At the time, her BP was over 250 systolic. She had a HA with the symptoms. No recurrence. All workup was negative. TIA versus complex migraine was suspected. She was dc on baby ASA  Any tremors?  denies   Any anosmia?    denies   Any incontinence of urine?  denies   Any bowel dysfunction?  denies      Patient lives  *** Does the patient drive?***      History on Initial Assessment 06/07/2018: This is a 72 year old right-handed woman with a history of hypertension, hyperlipidemia, presenting for evaluation of memory concerns. She reports that she started noticing memory changes "when they started letting people go at the plant in 2016." She noticed she was forgetting things. She reported symptoms to Dr. Quay Burow in November 2019 where she would leave food out without putting it away, one time she left ice cream container out after eating it, and left a doggie bag or groceries in her car. She has  burnt food she forgot was cooking and now has to make herself stay in the kitchen. She lost her keys one time and has started putting them in one place. She denies getting lost driving. No missed bills or medications. 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 She lives alone, her family (mother and brother) live in [place]. She has told them of her memory concerns, "they don't think I need to come here." Her maternal aunt had memory issues. No history of concussions or alcohol use.   She has occasional right hand and left finger tingling. She denies any headaches, dizziness, diplopia, dysarthria/dysphagia, neck/back pain, bowel/bladder dysfunction, anosmia, or tremors. No falls. Sleep is good. Mood "I think it's good." There is no family to corroborate history but there does not appear to be any paranoia or hallucinations.    Diagnostic Data: MRI brain without contrast done 07/2018 which did not show any acute changes. There was moderately advanced chronic microvascular disease.          Current Outpatient Medications on File Prior to Visit  Medication Sig Dispense Refill   acetaminophen (TYLENOL) 500 MG tablet Take 250-500 mg by mouth every 6 (six) hours as needed for headache (pain).     aspirin EC 81 MG tablet Take 1 tablet (81 mg total) by mouth daily. Swallow whole. 30 tablet 12   atenolol (TENORMIN) 25 MG tablet TAKE 1 TABLET BY MOUTH DAILY (Patient taking differently: Take 25 mg by mouth daily.) 90 tablet 3   betamethasone valerate ointment (VALISONE) 0.1 % Apply a pea sized amount BID for up to 2 weeks as needed (Patient taking differently: Apply 1 Application topically 2 (two) times daily as needed (irritation).) 30 g 0   Calcium Carbonate-Vitamin D (CALTRATE 600+D PO) Take 1 tablet by mouth daily.      cholecalciferol (VITAMIN D) 1000 units tablet Take 1,000 Units by mouth daily.     fexofenadine (ALLEGRA) 180  MG tablet Take 180 mg by mouth daily.     Fluticasone Propionate (FLONASE NA) Place 1 spray into the nose daily as needed (congestion).     hydrocortisone (ANUSOL-HC) 25 MG suppository Place 25 mg rectally as needed for hemorrhoids or anal itching.     Lactase (LACTAID PO) Take 1 tablet by mouth 2 (two) times daily as needed (stomach upset/ gas).      Multiple Vitamin (MULTIVITAMIN WITH MINERALS) TABS tablet Take 1 tablet by mouth daily.     Na Sulfate-K Sulfate-Mg Sulf 17.5-3.13-1.6 GM/177ML SOLN Take 1 kit by mouth as directed. May use generic Suprep, no prior authorization. Take as directed. 354 mL 0   Propylene Glycol (SYSTANE COMPLETE OP) Place 1 drop into both eyes at bedtime.  rosuvastatin (CRESTOR) 5 MG tablet TAKE 1 TABLET BY MOUTH 3 TIMES  WEEKLY 39 tablet 3   telmisartan (MICARDIS) 80 MG tablet Take 1 tablet (80 mg total) by mouth daily. 30 tablet 0   No current facility-administered medications on file prior to visit.     Observations/Objective:   There were no vitals filed for this visit. GEN:  The patient appears stated age and is in NAD.  Neurological examination: Patient is awake, alert, oriented x 3. No aphasia or dysarthria. Intact fluency and comprehension. Remote and recent memory intact. Able to name and repeat. Cranial nerves: Extraocular movements intact with no nystagmus. No facial asymmetry. Motor: moves all extremities symmetrically, at least anti-gravity x 4. No incoordination on finger to nose testing. Gait: narrow-based and steady, able to tandem walk adequately. Negative Romberg test.     Follow Up Instructions:    -I discussed the assessment and treatment plan with the patient. The patient was provided an opportunity to ask questions and all were answered. The patient agreed with the plan and demonstrated an understanding of the instructions.   The patient was advised to call back or seek an in-person evaluation if the symptoms worsen or if the condition  fails to improve as anticipated.    Total time spent on today's visit was ***minutes, including both face-to-face time and nonface-to-face time.  Time included that spent on review of records (prior notes available to me/labs/imaging if pertinent), discussing treatment and goals, answering patient's questions and coordinating care.   Sharene Butters, PA-C

## 2022-06-08 ENCOUNTER — Ambulatory Visit: Payer: Medicare Other | Admitting: Physician Assistant

## 2022-06-10 ENCOUNTER — Encounter: Payer: Self-pay | Admitting: Physician Assistant

## 2022-06-10 ENCOUNTER — Ambulatory Visit: Payer: Medicare Other | Admitting: Physician Assistant

## 2022-06-10 VITALS — BP 129/62 | HR 95 | Resp 18 | Ht 63.0 in | Wt 152.0 lb

## 2022-06-10 DIAGNOSIS — F0670 Mild neurocognitive disorder due to known physiological condition without behavioral disturbance: Secondary | ICD-10-CM | POA: Diagnosis not present

## 2022-06-10 DIAGNOSIS — I999 Unspecified disorder of circulatory system: Secondary | ICD-10-CM | POA: Diagnosis not present

## 2022-06-10 NOTE — Progress Notes (Signed)
Assessment/Plan:   Mild Cognitive Impairment, likely vascular   Karen Evans is a very pleasant 72 y.o. RH female with a history of HTN, HLD, Prediabetes, recent admission for TIA symptoms versus complex migraine 11/2021 without recurrence and with negative neuro workup  presenting today in follow-up for evaluation of memory loss.  She is not on antidementia medication, she did not tolerate Aricept in the past due to very vivid dreams.  Personally reviewed MRI /MRA head and neck of the brain July 2023 was negative for acute intracranial process, or acute or subacute infarct.  There was no large vessel occlusion or significant stenosis.  Very mild chronic microvascular disease.  No atrophy noted.MMSE today is 29/30. She is stable from the memory standpoint      Recommendations:   Follow up in 6 month Continue baby aspirin daily, recommend good control of cardiovascular risk factors Continue to control mood as per PCP Increase socialization  No indication for antidementia medication at this time     Subjective:   This patient is here alone . Previous records as well as any outside records available were reviewed prior to todays visit.   Patient was last seen on 11/2021 . MMSE at the time was 30/30     Any changes in memory since last visit? "I was out of town so I have to get back to doing drawing, painting and   [employer]" Patient has some difficulty remembering recent conversations and is able to remember people names that are familiar to her. She likes to watch game shows. Does not socialize as much.  repeats oneself?  Endorsed Disoriented when walking into a room?  Patient denies   Leaving objects in unusual places?  Patient denies   Wandering behavior?   denies   Any personality changes since last visit?   denies   Any worsening depression?: denies   Hallucinations or paranoia?  denies   Seizures?   denies    Any sleep changes?  Sleeps well, denies vivid dreams, REM  behavior or sleepwalking   Sleep apnea?   denies   Any hygiene concerns?   denies   Independent of bathing and dressing?  Endorsed  Does the patient needs help with medications? Patient is in charge  Who is in charge of the finances?  Patient is in charge     Any changes in appetite?  denies     Patient have trouble swallowing?  denies   Does the patient cook?  Most of the time  Any kitchen accidents such as leaving the stove on?   denies   Any headaches?    denies   Vision changes? denies Chronic back pain  denies   Ambulates with difficulty?    denies   Recent falls or head injuries?   denies     Unilateral weakness, numbness or tingling?In July 2023 she was brought to the ED as she was prepping for colonoscopy, with L facial droop and L arm and L LE tingling, lasting 30 mins, self resolving. At the time, her BP was over 008 systolic. She had a HA with the symptoms. No recurrence. All workup was negative. TIA versus complex migraine was suspected. She was discharged on baby ASA without recurrence any tremors?  denies   Any anosmia?    denies   Any incontinence of urine?  denies   Any bowel dysfunction?  denies      Patient lives  alone Does the patient drive?endorsed , denies  getting lost    History on Initial Assessment 06/07/2018: This is a 72 year old right-handed woman with a history of hypertension, hyperlipidemia, presenting for evaluation of memory concerns. She reports that she started noticing memory changes "when they started letting people go at the plant in 2016." She noticed she was forgetting things. She reported symptoms to Dr. Quay Burow in November 2019 where she would leave food out without putting it away, one time she left ice cream container out after eating it, and left a doggie bag or groceries in her car. She has burnt food she forgot was cooking and now has to make herself stay in the kitchen. She lost her keys one time and has started putting them in one place. She  denies getting lost driving. No missed bills or medications. 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 She lives alone, her family (mother and brother) live in [place]. She has told them of her memory concerns, "they don't think I need to come here." Her maternal aunt had memory issues. No history of concussions or alcohol use.   She has occasional right hand and left finger tingling. She denies any headaches, dizziness, diplopia, dysarthria/dysphagia, neck/back pain, bowel/bladder dysfunction, anosmia, or tremors. No falls. Sleep is good. Mood "I think it's good." There is no family to corroborate history but there does not appear to be any paranoia or hallucinations.    Diagnostic Data: MRI brain without contrast done 07/2018 which did not show any acute changes. There was moderately advanced chronic microvascular disease.     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   Heart murmur    Hiatal hernia 2002   EGD   Hypertension    Internal hemorrhoids    Migraines    when takes vitamins    Rectal polyp 01/24/2012   TUBULAR ADENOMA   Reflux esophagitis 2007   EGD   Stricture and stenosis of esophagus 2007   EGD   Tubular adenoma of colon    Ulcer    Vitamin D deficiency      Past Surgical History:  Procedure Laterality Date   COLONOSCOPY     polyps;granular ileitis 2006;2007 normal, Dr Sharlett Iles   COLONOSCOPY WITH PROPOFOL  07/2016   Dr.Pyrtle   HEMORRHOID SURGERY     OPERATIVE HYSTEROSCOPY  08/05/2004   Dr Garwin Brothers   OVARIAN CYST REMOVAL     POLYPECTOMY     UPPER GASTROINTESTINAL ENDOSCOPY       PREVIOUS MEDICATIONS:   CURRENT MEDICATIONS:   Outpatient Encounter Medications as of 06/10/2022  Medication Sig   acetaminophen (TYLENOL) 500 MG tablet Take 250-500 mg by mouth every 6 (six) hours as needed for headache (pain).   aspirin EC 81 MG tablet Take 1 tablet (81 mg total) by mouth daily. Swallow whole.   atenolol (TENORMIN) 25 MG tablet TAKE 1 TABLET BY MOUTH DAILY (Patient taking differently: Take 25 mg by mouth daily.)   betamethasone valerate ointment (VALISONE) 0.1 % Apply a pea sized amount BID for up to  2 weeks as needed (Patient taking differently: Apply 1 Application topically 2 (two) times daily as needed (irritation).)   Calcium Carbonate-Vitamin D (CALTRATE 600+D PO) Take 1 tablet by mouth daily.    cholecalciferol (VITAMIN D) 1000 units tablet Take 1,000 Units by mouth daily.   fexofenadine (ALLEGRA) 180 MG tablet Take 180 mg by mouth daily.   Fluticasone Propionate (FLONASE NA) Place 1 spray into the nose daily as needed (congestion).   hydrocortisone (ANUSOL-HC) 25 MG suppository Place 25 mg rectally as needed for hemorrhoids or anal itching.   Lactase (LACTAID PO) Take 1 tablet by mouth 2 (two) times daily as needed (stomach upset/ gas).    Multiple Vitamin (MULTIVITAMIN WITH MINERALS) TABS tablet Take 1 tablet by mouth daily.   Na Sulfate-K Sulfate-Mg Sulf 17.5-3.13-1.6 GM/177ML SOLN Take 1 kit by mouth as directed. May use generic Suprep, no prior authorization. Take as directed.   Propylene Glycol (SYSTANE COMPLETE OP) Place 1 drop into both eyes at bedtime.   rosuvastatin (CRESTOR) 5 MG tablet TAKE 1 TABLET BY MOUTH 3 TIMES  WEEKLY   telmisartan (MICARDIS) 80 MG tablet Take 1 tablet (80 mg total) by mouth daily.   No facility-administered encounter medications on file as of 06/10/2022.     Objective:     PHYSICAL EXAMINATION:    VITALS:   Vitals:   06/10/22 1355  BP: 129/62  Pulse: 95  Resp: 18  SpO2: 99%  Weight: 152 lb (68.9 kg)  Height: '5\' 3"'$  (1.6 m)    GEN:  The patient appears stated age and  is in NAD. HEENT:  Normocephalic, atraumatic.   Neurological examination:  General: NAD, well-groomed, appears stated age. Orientation: The patient is alert. Oriented to person, place and date Cranial nerves: There is good facial symmetry.The speech is fluent and clear. No aphasia or dysarthria. Fund of knowledge is appropriate. Recent memory and remote memory is normal.  Attention and concentration are slightly impaired.  Able to name objects and repeat phrases.  Hearing is intact to conversational tone.   Delayed recall 3/3 Sensation: Sensation is intact to light touch throughout Motor: Strength is at least antigravity x4. Tremors: none  DTR's 2/4 in UE/LE      08/13/2019    2:00 PM 12/20/2018    3:00 PM 06/07/2018    9:00 AM  Montreal Cognitive Assessment   Visuospatial/ Executive (0/5) '2 4 4  '$ Naming (0/3) '3 3 3  '$ Attention: Read list of digits (0/2) 0 1 2  Attention: Read list of letters (0/1) 0 1 1  Attention: Serial 7 subtraction starting at 100 (0/3) 0 1 0  Language: Repeat phrase (0/2) 0 2 2  Language : Fluency (0/1) 0 0 0  Abstraction (0/2) 1 2 0  Delayed Recall (0/5) 4 0 3  Orientation (0/6) '6 6 6  '$ Total '16 20 21  '$ Adjusted Score (based on education) 16         06/10/2022    2:00 PM 08/31/2021    3:00 PM 08/26/2020   11:00 AM  MMSE - Mini Mental State Exam  Orientation to time '5 5 5  '$ Orientation to Place '5 5 5  '$ Registration '3 3 3  '$ Attention/ Calculation 4 5 0  Recall '3 3 3  '$ Language- name 2 objects '2 2 2  '$ Language- repeat '1 1 1  '$ Language- follow 3 step command '3 3 3  '$ Language- read & follow direction '1 1 1  '$ Write a sentence '1 1 1  '$ Copy  design '1 1 1  '$ Total score '29 30 25       '$ Movement examination: Tone: There is normal tone in the UE/LE Abnormal movements:  no tremor.  No myoclonus.  No asterixis.   Coordination:  There is no decremation with RAM's. Normal finger to nose  Gait and Station: The patient has no difficulty arising out of a deep-seated chair  without the use of the hands. The patient's stride length is good.  Gait is cautious and narrow.   Thank you for allowing [place] the opportunity to participate in the care of this nice patient. Please do not hesitate to contact us for any questions or concerns.   Total time spent on today's visit was 30 minutes dedicated to this patient today, preparing to see patient, examining the patient, ordering tests and/or medications and counseling the patient, documenting clinical information in the EHR or other health record, independently interpreting results and communicating results to the patient/family, discussing treatment and goals, answering patient's questions and coordinating care.  Cc:  Binnie Rail, MD  Sharene Butters 06/10/2022 2:36 PM

## 2022-06-10 NOTE — Patient Instructions (Addendum)
It was a pleasure to see you today at our office.   Recommendations:  Meds: Follow up in 6 month Continue to monitor your Blood pressure, continue aspirin daily  Continue to stay hydrated  Continue to eat healthy Continue brain excercises   RECOMMENDATIONS FOR ALL PATIENTS WITH MEMORY PROBLEMS: 1. Continue to exercise (Recommend 30 minutes of walking everyday, or 3 hours every week) 2. Increase social interactions - continue going to [place] and enjoy social gatherings with friends and family 3. Eat healthy, avoid fried foods and eat more fruits and vegetables 4. Maintain adequate blood pressure, blood sugar, and blood cholesterol level. Reducing the risk of stroke and cardiovascular disease also helps promoting better memory. 5. Avoid stressful situations. Live a simple life and avoid aggravations. Organize your time and prepare for the next day in anticipation. 6. Sleep well, avoid any interruptions of sleep and avoid any distractions in the bedroom that may interfere with adequate sleep quality 7. Avoid sugar, avoid sweets as there is a strong link between excessive sugar intake, diabetes, and cognitive impairment We discussed the Mediterranean diet, which has been shown to help patients reduce the risk of progressive memory disorders and reduces cardiovascular risk. This includes eating fish, eat fruits and green leafy vegetables, nuts like almonds and hazelnuts, walnuts, and also use olive oil. Avoid fast foods and fried foods as much as possible. Avoid sweets and sugar as sugar use has been linked to worsening of memory function.  There is always a concern of gradual progression of memory problems. If this is the case, then we may need to adjust level of care according to patient needs. Support, both to the patient and caregiver, should then be put into place.    FALL PRECAUTIONS: Be cautious when walking. Scan the area for obstacles that may increase the risk of trips and falls. When  getting up in the mornings, sit up at the edge of the bed for a few minutes before getting out of bed. Consider elevating the bed at the head end to avoid drop of blood pressure when getting up. Walk always in a well-lit room (use night lights in the walls). Avoid area rugs or power cords from appliances in the middle of the walkways. Use a walker or a cane if necessary and consider physical therapy for balance exercise. Get your eyesight checked regularly.   HOME SAFETY: Consider the safety of the kitchen when operating appliances like stoves, microwave oven, and blender. Consider having supervision and share cooking responsibilities until no longer able to participate in those. Accidents with firearms and other hazards in the house should be identified and addressed as well       Mediterranean Diet A Mediterranean diet refers to food and lifestyle choices that are based on the traditions of countries located on the [employer]. This way of eating has been shown to help prevent certain conditions and improve outcomes for people who have chronic diseases, like kidney disease and heart disease. What are tips for following this plan? Lifestyle  Cook and eat meals together with your family, when possible. Drink enough fluid to keep your urine clear or pale yellow. Be physically active every day. This includes: Aerobic exercise like running or swimming. Leisure activities like gardening, walking, or housework. Get 7-8 hours of sleep each night. If recommended by your health care provider, drink red wine in moderation. This means 1 glass a day for nonpregnant women and 2 glasses a day for men. A glass of  wine equals 5 oz (150 mL). Reading food labels  Check the serving size of packaged foods. For foods such as rice and pasta, the serving size refers to the amount of cooked product, not dry. Check the total fat in packaged foods. Avoid foods that have saturated fat or trans fats. Check the  ingredients list for added sugars, such as corn syrup. Shopping  At the grocery store, buy most of your food from the areas near the walls of the store. This includes: Fresh fruits and vegetables (produce). Grains, beans, nuts, and seeds. Some of these may be available in unpackaged forms or large amounts (in bulk). Fresh seafood. Poultry and eggs. Low-fat dairy products. Buy whole ingredients instead of prepackaged foods. Buy fresh fruits and vegetables in-season from local farmers markets. Buy frozen fruits and vegetables in resealable bags. If you do not have access to quality fresh seafood, buy precooked frozen shrimp or canned fish, such as tuna, salmon, or sardines. Buy small amounts of raw or cooked vegetables, salads, or olives from the deli or salad bar at your store. Stock your pantry so you always have certain foods on hand, such as olive oil, canned tuna, canned tomatoes, rice, pasta, and beans. Cooking  Cook foods with extra-virgin olive oil instead of using butter or other vegetable oils. Have meat as a side dish, and have vegetables or grains as your main dish. This means having meat in small portions or adding small amounts of meat to foods like pasta or stew. Use beans or vegetables instead of meat in common dishes like chili or lasagna. Experiment with different cooking methods. Try roasting or broiling vegetables instead of steaming or sauteing them. Add frozen vegetables to soups, stews, pasta, or rice. Add nuts or seeds for added healthy fat at each meal. You can add these to yogurt, salads, or vegetable dishes. Marinate fish or vegetables using olive oil, lemon juice, garlic, and fresh herbs. Meal planning  Plan to eat 1 vegetarian meal one day each week. Try to work up to 2 vegetarian meals, if possible. Eat seafood 2 or more times a week. Have healthy snacks readily available, such as: Vegetable sticks with hummus. Greek yogurt. Fruit and nut trail mix. Eat  balanced meals throughout the week. This includes: Fruit: 2-3 servings a day Vegetables: 4-5 servings a day Low-fat dairy: 2 servings a day Fish, poultry, or lean meat: 1 serving a day Beans and legumes: 2 or more servings a week Nuts and seeds: 1-2 servings a day Whole grains: 6-8 servings a day Extra-virgin olive oil: 3-4 servings a day Limit red meat and sweets to only a few servings a month What are my food choices? Mediterranean diet Recommended Grains: Whole-grain pasta. Brown rice. Bulgar wheat. Polenta. Couscous. Whole-wheat bread. [place]. Vegetables: Artichokes. Beets. Broccoli. Cabbage. Carrots. Eggplant. Green beans. Chard. Kale. Spinach. Onions. Leeks. Peas. Squash. Tomatoes. Peppers. Radishes. Fruits: Apples. Apricots. Avocado. Berries. Bananas. Cherries. Dates. Figs. Grapes. Lemons. Melon. Oranges. Peaches. Plums. Pomegranate. Meats and other protein foods: Beans. Almonds. Sunflower seeds. Pine nuts. Peanuts. [place]. Salmon. Scallops. Shrimp. [place]. Tilapia. Clams. Oysters. Eggs. Dairy: Low-fat milk. Cheese. Greek yogurt. Beverages: Water. Red wine. Herbal tea. Fats and oils: Extra virgin olive oil. Avocado oil. Grape seed oil. Sweets and desserts: [place] yogurt with honey. Baked apples. Poached pears. Trail mix. Seasoning and other foods: Basil. Cilantro. Coriander. Cumin. Mint. Parsley. Sage. Rosemary. Tarragon. Garlic. Oregano. Thyme. Pepper. Balsalmic vinegar. Tahini. Hummus. Tomato sauce. Olives. Mushrooms. Limit these Grains: Prepackaged pasta  or rice dishes. Prepackaged cereal with added sugar. Vegetables: Deep fried potatoes (french fries). Fruits: Fruit canned in syrup. Meats and other protein foods: Beef. Pork. Lamb. Poultry with skin. Hot dogs. [place] Salines. Dairy: Ice cream. Sour cream. Whole milk. Beverages: Juice. Sugar-sweetened soft drinks. Beer. Liquor and spirits. Fats and oils: Butter. Canola oil. Vegetable oil. Beef fat (tallow). Lard. Sweets and desserts:  Cookies. Cakes. Pies. Candy. Seasoning and other foods: Mayonnaise. Premade sauces and marinades. The items listed may not be a complete list. Talk with your dietitian about what dietary choices are right for you. Summary The Mediterranean diet includes both food and lifestyle choices. Eat a variety of fresh fruits and vegetables, beans, nuts, seeds, and whole grains. Limit the amount of red meat and sweets that you eat. Talk with your health care provider about whether it is safe for you to drink red wine in moderation. This means 1 glass a day for nonpregnant women and 2 glasses a day for men. A glass of wine equals 5 oz (150 mL). This information is not intended to replace advice given to you by your health care provider. Make sure you discuss any questions you have with your health care provider. Document Released: 12/25/2015 Document Revised: 01/27/2016 Document Reviewed: 12/25/2015 Elsevier Interactive Patient Education  2017 [employer].

## 2022-06-25 ENCOUNTER — Telehealth: Payer: Self-pay | Admitting: Internal Medicine

## 2022-06-25 NOTE — Telephone Encounter (Signed)
Pt called stated when she take osuvastatin (CRESTOR) 5 MG tablet she breaks out in hives around her neck and tingling in her feet.  Patient also stated when she take  aspirin EC 81 MG tablet her acid reflux flares up.  Pt want a call back with an update for what she needs to do.

## 2022-06-27 NOTE — Telephone Encounter (Signed)
Stop crestor.  She should try to take the aspirin ideally to help prevent strokes --- have her take it with a meal.

## 2022-06-28 ENCOUNTER — Encounter: Payer: Self-pay | Admitting: Gastroenterology

## 2022-06-28 ENCOUNTER — Ambulatory Visit: Payer: Medicare Other | Admitting: Gastroenterology

## 2022-06-28 ENCOUNTER — Encounter: Payer: Self-pay | Admitting: Internal Medicine

## 2022-06-28 VITALS — BP 136/80 | HR 55 | Ht 63.0 in | Wt 152.0 lb

## 2022-06-28 DIAGNOSIS — K219 Gastro-esophageal reflux disease without esophagitis: Secondary | ICD-10-CM | POA: Diagnosis not present

## 2022-06-28 DIAGNOSIS — G720 Drug-induced myopathy: Secondary | ICD-10-CM | POA: Insufficient documentation

## 2022-06-28 DIAGNOSIS — T466X5A Adverse effect of antihyperlipidemic and antiarteriosclerotic drugs, initial encounter: Secondary | ICD-10-CM | POA: Insufficient documentation

## 2022-06-28 DIAGNOSIS — K5900 Constipation, unspecified: Secondary | ICD-10-CM | POA: Diagnosis not present

## 2022-06-28 NOTE — Patient Instructions (Addendum)
_______________________________________________________  If your blood pressure at your visit was 140/90 or greater, please contact your primary care physician to follow up on this.  _______________________________________________________  If you are age 70 or older, your body mass index should be between 23-30. Your Body mass index is 26.93 kg/m. If this is out of the aforementioned range listed, please consider follow up with your Primary Care Provider.  If you are age 65 or younger, your body mass index should be between 19-25. Your Body mass index is 26.93 kg/m. If this is out of the aformentioned range listed, please consider follow up with your Primary Care Provider.   ________________________________________________________  The [facility] providers would like to encourage you to use [facility] to communicate with providers for non-urgent requests or questions.  Due to long hold times on the telephone, sending your provider a message by [facility] may be a faster and more efficient way to get a response.  Please allow 48 business hours for a response.  Please remember that this is for non-urgent requests.  _______________________________________________________  Start Miralax 1 capful daily in 8 ounces of liquid.  Continue Pepcid 20 mg twice daily   Follow up as needed.

## 2022-06-28 NOTE — Patient Instructions (Signed)
      Blood work was ordered.   The lab is on the first floor.    Medications changes include :       A referral was ordered for XXX.     Someone will call you to schedule an appointment.    No follow-ups on file.

## 2022-06-28 NOTE — Progress Notes (Signed)
06/28/2022 Karen Evans [id number] 02-Dec-1950   HISTORY OF PRESENT ILLNESS: This is a pleasant 72 year old female is a patient of Dr. Vena Rua.  She is here today to discuss issues with constipation and GERD.  She tells me that over period time her bowel movements are getting harder to pass.  She denies any rectal bleeding.  She has not tried anything for her symptoms.  Colonoscopy August 2023 with nodular mucosa seen in the distal rectum. Surgical [P], colon, distal rectal bxs MUCOSAL PROLAPSE TYPE POLYP  She also says that she has been having acid reflux just for the past couple of months.  She started aspirin 81 mg daily within the past 6 months and thinks that is what is causing her reflux.  She has tried some Pepcid max OTC 20 mg and says that that has helped when she is taking it.  She had an EGD in September 2013 by Dr. Sharlett Iles with only a small hiatal hernia noted.  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   Heart murmur    Hiatal hernia 2002   EGD   Hypertension    Internal hemorrhoids    Migraines    when takes vitamins    Rectal polyp 01/24/2012   TUBULAR ADENOMA   Reflux esophagitis 2007   EGD   Stricture and stenosis of esophagus 2007   EGD   Tubular adenoma of colon    Ulcer    Vitamin D deficiency    Past Surgical History:  Procedure Laterality Date   COLONOSCOPY     polyps;granular ileitis 2006;2007 normal, Dr Sharlett Iles   COLONOSCOPY WITH PROPOFOL  07/2016   Dr.Pyrtle   HEMORRHOID SURGERY     OPERATIVE HYSTEROSCOPY  08/05/2004   Dr Garwin Brothers   OVARIAN CYST REMOVAL     POLYPECTOMY     UPPER GASTROINTESTINAL ENDOSCOPY      reports that she has never smoked. She has never used smokeless  tobacco. She reports that she does not drink alcohol and does not use drugs. family history includes Cancer in an other family member; Colon cancer in her maternal grandmother; Diabetes in her brother and mother; Heart attack in an other family member; Heart disease in her mother; Hypertension in her brother and mother; Lymphoma in her father; Penile cancer in her paternal grandfather; Rectal cancer in her paternal grandmother. Allergies  Allergen Reactions   Cefprozil Hives   Erythromycin Hives   Esomeprazole Magnesium Hives   Penicillins Hives, Rash and Other (See Comments)    Has patient had a PCN reaction causing immediate rash, facial/tongue/throat swelling, SOB or lightheadedness with hypotension: Yes  Has patient had a PCN reaction causing severe rash involving mucus membranes or skin necrosis: No  Has patient had a PCN reaction that required hospitalization: No  Has patient had a PCN reaction occurring within the last 10 years: No  If all of the above answers are "NO", then may proceed with Cephalosporin use.   Sulfonamide Derivatives Hives   Amlodipine Other (See Comments)    gerd   Crestor [Rosuvastatin Calcium] Itching   Crestor [Rosuvastatin]  Itching and Other (See Comments)    Lightheadedness, prickly all over   Erythromycin Base Hives   Hydrochlorothiazide Other (See Comments)    lightheadedness   Pravastatin Other (See Comments)    Dizzy, muscle soreness   Sulfa Antibiotics Hives and Other (See Comments)   Atorvastatin Other (See Comments)    Eyes itch   Doxycycline Nausea And Vomiting   Zetia [Ezetimibe] Rash    Caused drowsiness      Outpatient Encounter Medications as of 06/28/2022  Medication Sig   acetaminophen (TYLENOL) 500 MG tablet Take 250-500 mg by mouth every 6 (six) hours as needed for headache (pain).   aspirin EC 81 MG tablet Take 1 tablet (81 mg total) by mouth daily. Swallow whole.   atenolol (TENORMIN) 25 MG tablet TAKE 1 TABLET BY MOUTH DAILY  (Patient taking differently: Take 25 mg by mouth daily.)   betamethasone valerate ointment (VALISONE) 0.1 % Apply a pea sized amount BID for up to 2 weeks as needed (Patient taking differently: Apply 1 Application topically 2 (two) times daily as needed (irritation).)   Calcium Carbonate-Vitamin D (CALTRATE 600+D PO) Take 1 tablet by mouth daily.    cholecalciferol (VITAMIN D) 1000 units tablet Take 1,000 Units by mouth daily.   fexofenadine (ALLEGRA) 180 MG tablet Take 180 mg by mouth daily.   Fluticasone Propionate (FLONASE NA) Place 1 spray into the nose daily as needed (congestion).   hydrocortisone (ANUSOL-HC) 25 MG suppository Place 25 mg rectally as needed for hemorrhoids or anal itching.   Lactase (LACTAID PO) Take 1 tablet by mouth 2 (two) times daily as needed (stomach upset/ gas).    Multiple Vitamin (MULTIVITAMIN WITH MINERALS) TABS tablet Take 1 tablet by mouth daily.   Na Sulfate-K Sulfate-Mg Sulf 17.5-3.13-1.6 GM/177ML SOLN Take 1 kit by mouth as directed. May use generic Suprep, no prior authorization. Take as directed.   Propylene Glycol (SYSTANE COMPLETE OP) Place 1 drop into both eyes at bedtime.   rosuvastatin (CRESTOR) 5 MG tablet TAKE 1 TABLET BY MOUTH 3 TIMES  WEEKLY   telmisartan (MICARDIS) 80 MG tablet Take 1 tablet (80 mg total) by mouth daily.   No facility-administered encounter medications on file as of 06/28/2022.     REVIEW OF SYSTEMS  : All other systems reviewed and negative except where noted in the History of Present Illness.   PHYSICAL EXAM: BP 136/80   Pulse (!) 55   Ht 5' 3"$  (1.6 m)   Wt 152 lb (68.9 kg)   BMI 26.93 kg/m  General: Well developed AA female in no acute distress Head: Normocephalic and atraumatic Eyes:  Sclerae anicteric, conjunctiva pink. Ears: Normal auditory acuity Lungs: Clear throughout to auscultation; no W/R/R. Heart: Regular rate and rhythm; no M/R/G. Abdomen: Soft, non-distended.  BS present.  Non-tender. Musculoskeletal:  Symmetrical with no gross deformities  Skin: No lesions on visible extremities Extremities: No edema  Neurological: Alert oriented x 4, grossly non-focal Psychological:  Alert and cooperative. Normal mood and affect  ASSESSMENT AND PLAN: *Constipation: States that over time her bowel movements are getting harder to pass.  Has not tried anything for her symptoms.  Advised that sometimes medications, change in seasons with not being as active or maybe not eating as much fresh fruits and vegetables, not drinking enough water, aging, etc. can affect this.  Recommended trying a dose of MiraLAX mixed in 8 ounces of liquid daily. *GERD: This has only been an issue over the past few months and she  thinks that the aspirin 81 mg daily is causing her issue as she has only been on that for the past several months.  Has taken some Pepcid max 20 mg OTC on occasion and it does seem to help.  Advised that this is a daily regular issue and she can use that a couple of times daily.  If that does not seem to help may need to escalate to PPI therapy.  **She will message [place] with an update in 4 to 6 weeks and return for ongoing issues.  CC:  Binnie Rail, MD

## 2022-06-28 NOTE — Progress Notes (Unsigned)
    Subjective:    Patient ID: Karen Evans, female    DOB: [birth year], 72 y.o.   MRN: [id number]      HPI Karen Evans is here for No chief complaint on file.    Hives around neck -     Medications and allergies reviewed with patient and updated if appropriate.  Current Outpatient Medications on File Prior to Visit  Medication Sig Dispense Refill   acetaminophen (TYLENOL) 500 MG tablet Take 250-500 mg by mouth every 6 (six) hours as needed for headache (pain).     aspirin EC 81 MG tablet Take 1 tablet (81 mg total) by mouth daily. Swallow whole. 30 tablet 12   atenolol (TENORMIN) 25 MG tablet TAKE 1 TABLET BY MOUTH DAILY (Patient taking differently: Take 25 mg by mouth daily.) 90 tablet 3   betamethasone valerate ointment (VALISONE) 0.1 % Apply a pea sized amount BID for up to 2 weeks as needed (Patient taking differently: Apply 1 Application topically 2 (two) times daily as needed (irritation).) 30 g 0   Calcium Carbonate-Vitamin D (CALTRATE 600+D PO) Take 1 tablet by mouth daily.      cholecalciferol (VITAMIN D) 1000 units tablet Take 1,000 Units by mouth daily.     fexofenadine (ALLEGRA) 180 MG tablet Take 180 mg by mouth daily.     Fluticasone Propionate (FLONASE NA) Place 1 spray into the nose daily as needed (congestion).     hydrocortisone (ANUSOL-HC) 25 MG suppository Place 25 mg rectally as needed for hemorrhoids or anal itching.     Lactase (LACTAID PO) Take 1 tablet by mouth 2 (two) times daily as needed (stomach upset/ gas).      Multiple Vitamin (MULTIVITAMIN WITH MINERALS) TABS tablet Take 1 tablet by mouth daily.     Na Sulfate-K Sulfate-Mg Sulf 17.5-3.13-1.6 GM/177ML SOLN Take 1 kit by mouth as directed. May use generic Suprep, no prior authorization. Take as directed. 354 mL 0   Propylene Glycol (SYSTANE COMPLETE OP) Place 1 drop into both eyes at bedtime.     rosuvastatin (CRESTOR) 5 MG tablet TAKE 1 TABLET BY MOUTH 3 TIMES  WEEKLY 39 tablet 3   telmisartan (MICARDIS)  80 MG tablet Take 1 tablet (80 mg total) by mouth daily. 30 tablet 0   No current facility-administered medications on file prior to visit.    Review of Systems     Objective:  There were no vitals filed for this visit. BP Readings from Last 3 Encounters:  06/28/22 136/80  06/10/22 129/62  05/31/22 138/84   Wt Readings from Last 3 Encounters:  06/28/22 152 lb (68.9 kg)  06/10/22 152 lb (68.9 kg)  05/31/22 153 lb (69.4 kg)   There is no height or weight on file to calculate BMI.    Physical Exam         Assessment & Plan:    See Problem List for Assessment and Plan of chronic medical problems.

## 2022-06-28 NOTE — Telephone Encounter (Signed)
Spoke with patient today. 

## 2022-06-29 ENCOUNTER — Ambulatory Visit (INDEPENDENT_AMBULATORY_CARE_PROVIDER_SITE_OTHER): Payer: Medicare Other | Admitting: Internal Medicine

## 2022-06-29 ENCOUNTER — Encounter: Payer: Self-pay | Admitting: Internal Medicine

## 2022-06-29 ENCOUNTER — Ambulatory Visit: Payer: Medicare Other | Admitting: Internal Medicine

## 2022-06-29 VITALS — BP 126/80 | HR 60 | Temp 98.7°F | Ht 63.0 in | Wt 153.0 lb

## 2022-06-29 DIAGNOSIS — E7849 Other hyperlipidemia: Secondary | ICD-10-CM

## 2022-06-29 DIAGNOSIS — E049 Nontoxic goiter, unspecified: Secondary | ICD-10-CM

## 2022-06-29 DIAGNOSIS — G720 Drug-induced myopathy: Secondary | ICD-10-CM

## 2022-06-29 DIAGNOSIS — L509 Urticaria, unspecified: Secondary | ICD-10-CM

## 2022-06-29 DIAGNOSIS — K219 Gastro-esophageal reflux disease without esophagitis: Secondary | ICD-10-CM | POA: Diagnosis not present

## 2022-06-29 DIAGNOSIS — I10 Essential (primary) hypertension: Secondary | ICD-10-CM

## 2022-06-29 DIAGNOSIS — T466X5A Adverse effect of antihyperlipidemic and antiarteriosclerotic drugs, initial encounter: Secondary | ICD-10-CM | POA: Diagnosis not present

## 2022-06-29 DIAGNOSIS — N3941 Urge incontinence: Secondary | ICD-10-CM | POA: Insufficient documentation

## 2022-06-29 NOTE — Assessment & Plan Note (Signed)
Chronic Feels like she developed hives on her neck related to Crestor-advised for her to stop this She has had the same rash on her neck from other things and I do not really think it is from the statin, but has tried several statins and has not "tolerated them" Has not tolerated Zetia Discussed injectable medication, but she is not sure if she could do that She has a follow-up in July will check lipid panel at that time and discuss options

## 2022-06-29 NOTE — Assessment & Plan Note (Addendum)
Chronic She thinks it is from the ASA Saw GI - advised pepcid 20 mg bid, which she is taking

## 2022-06-29 NOTE — Assessment & Plan Note (Signed)
New States urinary continence when her bladder is full Advised going to bathroom more frequently and is soon as she feels it large she needs to go to the bathroom and not delay going

## 2022-06-29 NOTE — Assessment & Plan Note (Signed)
Chronic Blood pressure is well-controlled Continue atenolol 25 mg daily, telmisartan 80 mg daily

## 2022-06-29 NOTE — Assessment & Plan Note (Signed)
Has not tolerated several statins or Zetia Crestor just discontinued-has tried it twice

## 2022-06-29 NOTE — Assessment & Plan Note (Signed)
Chronic Ultrasound ordered

## 2022-06-29 NOTE — Assessment & Plan Note (Addendum)
States "hives" and back of neck, which is partially resolved and may have been hives, but more looks like I dermatitis ?  Contact or allergic in nature Less likely related to statin which she stopped and thought was the culprit She states it itches at times-it is getting better Can use over-the-counter cream for Vaseline for dryness  No other treatment needed

## 2022-07-05 NOTE — Progress Notes (Signed)
Addendum: Reviewed and agree with assessment and management plan. Crosby Oriordan M, MD  

## 2022-07-28 ENCOUNTER — Ambulatory Visit
Admission: RE | Admit: 2022-07-28 | Discharge: 2022-07-28 | Disposition: A | Discharge status: Home / Self Care | Payer: Medicare Other | Source: Ambulatory Visit | Attending: Internal Medicine | Admitting: Internal Medicine

## 2022-07-28 DIAGNOSIS — E041 Nontoxic single thyroid nodule: Secondary | ICD-10-CM | POA: Diagnosis not present

## 2022-07-28 DIAGNOSIS — E049 Nontoxic goiter, unspecified: Secondary | ICD-10-CM

## 2022-08-17 ENCOUNTER — Ambulatory Visit (INDEPENDENT_AMBULATORY_CARE_PROVIDER_SITE_OTHER): Payer: Medicare Other

## 2022-08-17 VITALS — BP 122/62 | HR 60 | Temp 98.4°F | Ht 63.0 in | Wt 151.6 lb

## 2022-08-17 DIAGNOSIS — Z0000 Encounter for general adult medical examination without abnormal findings: Secondary | ICD-10-CM

## 2022-08-17 NOTE — Patient Instructions (Signed)
Ms. Karen Evans , Thank you for taking time to come for your Medicare Wellness Visit. I appreciate your ongoing commitment to your health goals. Please review the following plan we discussed and let me know if I can assist you in the future.   These are the goals we discussed:  Goals      My goal for 2024 to to continue to exercise and eat healthy.        This is a list of the screening recommended for you and due dates:  Health Maintenance  Topic Date Due   Medicare Annual Wellness Visit  02/07/2021   Mammogram  02/26/2024   DEXA scan (bone density measurement)  10/20/2024   DTaP/Tdap/Td vaccine (3 - Td or Tdap) 02/16/2031   Pneumonia Vaccine  Completed   COVID-19 Vaccine  Completed   Hepatitis C Screening: USPSTF Recommendation to screen - Ages 17-79 yo.  Completed   Zoster (Shingles) Vaccine  Completed   HPV Vaccine  Aged Out   Flu Shot  Discontinued   Colon Cancer Screening  Discontinued    Advanced directives: Yes  Conditions/risks identified: Yes  Next appointment: Follow up in one year for your annual wellness visit.   Preventive Care 70 Years and Older, Female Preventive care refers to lifestyle choices and visits with your health care provider that can promote health and wellness. What does preventive care include? A yearly physical exam. This is also called an annual well check. Dental exams once or twice a year. Routine eye exams. Ask your health care provider how often you should have your eyes checked. Personal lifestyle choices, including: Daily care of your teeth and gums. Regular physical activity. Eating a healthy diet. Avoiding tobacco and drug use. Limiting alcohol use. Practicing safe sex. Taking low-dose aspirin every day. Taking vitamin and mineral supplements as recommended by your health care provider. What happens during an annual well check? The services and screenings done by your health care provider during your annual well check will depend  on your age, overall health, lifestyle risk factors, and family history of disease. Counseling  Your health care provider may ask you questions about your: Alcohol use. Tobacco use. Drug use. Emotional well-being. Home and relationship well-being. Sexual activity. Eating habits. History of falls. Memory and ability to understand (cognition). Work and work Statistician. Reproductive health. Screening  You may have the following tests or measurements: Height, weight, and BMI. Blood pressure. Lipid and cholesterol levels. These may be checked every 5 years, or more frequently if you are over 75 years old. Skin check. Lung cancer screening. You may have this screening every year starting at age 40 if you have a 30-pack-year history of smoking and currently smoke or have quit within the past 15 years. Fecal occult blood test (FOBT) of the stool. You may have this test every year starting at age 33. Flexible sigmoidoscopy or colonoscopy. You may have a sigmoidoscopy every 5 years or a colonoscopy every 10 years starting at age 90. Hepatitis C blood test. Hepatitis B blood test. Sexually transmitted disease (STD) testing. Diabetes screening. This is done by checking your blood sugar (glucose) after you have not eaten for a while (fasting). You may have this done every 1-3 years. Bone density scan. This is done to screen for osteoporosis. You may have this done starting at age 72. Mammogram. This may be done every 1-2 years. Talk to your health care provider about how often you should have regular mammograms. Talk with your health  care provider about your test results, treatment options, and if necessary, the need for more tests. Vaccines  Your health care provider may recommend certain vaccines, such as: Influenza vaccine. This is recommended every year. Tetanus, diphtheria, and acellular pertussis (Tdap, Td) vaccine. You may need a Td booster every 10 years. Zoster vaccine. You may need  this after age 70. Pneumococcal 13-valent conjugate (PCV13) vaccine. One dose is recommended after age 70. Pneumococcal polysaccharide (PPSV23) vaccine. One dose is recommended after age 70. Talk to your health care provider about which screenings and vaccines you need and how often you need them. This information is not intended to replace advice given to you by your health care provider. Make sure you discuss any questions you have with your health care provider. Document Released: 05/30/2015 Document Revised: 01/21/2016 Document Reviewed: 03/04/2015 Elsevier Interactive Patient Education  2017 [employer] Prevention in the Home Falls can cause injuries. They can happen to people of all ages. There are many things you can do to make your home safe and to help prevent falls. What can I do on the outside of my home? Regularly fix the edges of walkways and driveways and fix any cracks. Remove anything that might make you trip as you walk through a door, such as a raised step or threshold. Trim any bushes or trees on the path to your home. Use bright outdoor lighting. Clear any walking paths of anything that might make someone trip, such as rocks or tools. Regularly check to see if handrails are loose or broken. Make sure that both sides of any steps have handrails. Any raised decks and porches should have guardrails on the edges. Have any leaves, snow, or ice cleared regularly. Use sand or salt on walking paths during winter. Clean up any spills in your garage right away. This includes oil or grease spills. What can I do in the bathroom? Use night lights. Install grab bars by the toilet and in the tub and shower. Do not use towel bars as grab bars. Use non-skid mats or decals in the tub or shower. If you need to sit down in the shower, use a plastic, non-slip stool. Keep the floor dry. Clean up any water that spills on the floor as soon as it happens. Remove soap buildup in the tub  or shower regularly. Attach bath mats securely with double-sided non-slip rug tape. Do not have throw rugs and other things on the floor that can make you trip. What can I do in the bedroom? Use night lights. Make sure that you have a light by your bed that is easy to reach. Do not use any sheets or blankets that are too big for your bed. They should not hang down onto the floor. Have a firm chair that has side arms. You can use this for support while you get dressed. Do not have throw rugs and other things on the floor that can make you trip. What can I do in the kitchen? Clean up any spills right away. Avoid walking on wet floors. Keep items that you use a lot in easy-to-reach places. If you need to reach something above you, use a strong step stool that has a grab bar. Keep electrical cords out of the way. Do not use floor polish or wax that makes floors slippery. If you must use wax, use non-skid floor wax. Do not have throw rugs and other things on the floor that can make you trip. What can  I do with my stairs? Do not leave any items on the stairs. Make sure that there are handrails on both sides of the stairs and use them. Fix handrails that are broken or loose. Make sure that handrails are as long as the stairways. Check any carpeting to make sure that it is firmly attached to the stairs. Fix any carpet that is loose or worn. Avoid having throw rugs at the top or bottom of the stairs. If you do have throw rugs, attach them to the floor with carpet tape. Make sure that you have a light switch at the top of the stairs and the bottom of the stairs. If you do not have them, ask someone to add them for you. What else can I do to help prevent falls? Wear shoes that: Do not have high heels. Have rubber bottoms. Are comfortable and fit you well. Are closed at the toe. Do not wear sandals. If you use a stepladder: Make sure that it is fully opened. Do not climb a closed stepladder. Make  sure that both sides of the stepladder are locked into place. Ask someone to hold it for you, if possible. Clearly mark and make sure that you can see: Any grab bars or handrails. First and last steps. Where the edge of each step is. Use tools that help you move around (mobility aids) if they are needed. These include: Canes. Walkers. Scooters. Crutches. Turn on the lights when you go into a dark area. Replace any light bulbs as soon as they burn out. Set up your furniture so you have a clear path. Avoid moving your furniture around. If any of your floors are uneven, fix them. If there are any pets around you, be aware of where they are. Review your medicines with your doctor. Some medicines can make you feel dizzy. This can increase your chance of falling. Ask your doctor what other things that you can do to help prevent falls. This information is not intended to replace advice given to you by your health care provider. Make sure you discuss any questions you have with your health care provider. Document Released: 02/27/2009 Document Revised: 10/09/2015 Document Reviewed: 06/07/2014 Elsevier Interactive Patient Education  2017 [employer].

## 2022-08-17 NOTE — Progress Notes (Signed)
Subjective:   Karen Evans is a 72 y.o. female who presents for Medicare Annual (Subsequent) preventive examination.  Review of Systems      Cardiac Risk Factors include: advanced age (>66men, >12 women);dyslipidemia;hypertension     Objective:    Today's Vitals   08/17/22 1125  BP: 122/62  Pulse: 60  Temp: 98.4 F (36.9 C)  TempSrc: Temporal  SpO2: 98%  Weight: 151 lb 9.6 oz (68.8 kg)  Height: 5\' 3"  (1.6 m)  PainSc: 0-No pain   Body mass index is 26.85 kg/m.     08/17/2022   11:38 AM 06/10/2022    1:58 PM 12/08/2021   11:00 PM 08/31/2021    3:42 PM 02/26/2021    2:44 PM 08/26/2020   10:27 AM 02/08/2020   10:40 AM  Advanced Directives  Does Patient Have a Medical Advance Directive? Yes Yes No Yes Yes Yes Yes  Type of Paramedic of [place];Living will     Living will [place];Living will  Does patient want to make changes to medical advance directive?       No - Patient declined  Copy of [place] in Chart? No - copy requested      No - copy requested  Would patient like information on creating a medical advance directive?   No - Patient declined        Current Medications (verified) Outpatient Encounter Medications as of 08/17/2022  Medication Sig   acetaminophen (TYLENOL) 500 MG tablet Take 250-500 mg by mouth every 6 (six) hours as needed for headache (pain).   aspirin EC 81 MG tablet Take 1 tablet (81 mg total) by mouth daily. Swallow whole.   atenolol (TENORMIN) 25 MG tablet TAKE 1 TABLET BY MOUTH DAILY (Patient taking differently: Take 25 mg by mouth daily.)   betamethasone valerate ointment (VALISONE) 0.1 % Apply a pea sized amount BID for up to 2 weeks as needed (Patient taking differently: Apply 1 Application topically 2 (two) times daily as needed (irritation).)   Calcium Carbonate-Vitamin D (CALTRATE 600+D PO) Take 1 tablet by mouth daily.    cholecalciferol (VITAMIN D) 1000 units tablet Take  1,000 Units by mouth daily.   fexofenadine (ALLEGRA) 180 MG tablet Take 180 mg by mouth daily.   Fluticasone Propionate (FLONASE NA) Place 1 spray into the nose daily as needed (congestion).   hydrocortisone (ANUSOL-HC) 25 MG suppository Place 25 mg rectally as needed for hemorrhoids or anal itching.   Lactase (LACTAID PO) Take 1 tablet by mouth 2 (two) times daily as needed (stomach upset/ gas).    Multiple Vitamin (MULTIVITAMIN WITH MINERALS) TABS tablet Take 1 tablet by mouth daily.   Na Sulfate-K Sulfate-Mg Sulf 17.5-3.13-1.6 GM/177ML SOLN Take 1 kit by mouth as directed. May use generic Suprep, no prior authorization. Take as directed.   Propylene Glycol (SYSTANE COMPLETE OP) Place 1 drop into both eyes at bedtime.   telmisartan (MICARDIS) 80 MG tablet Take 1 tablet (80 mg total) by mouth daily.   No facility-administered encounter medications on file as of 08/17/2022.    Allergies (verified) Cefprozil, Erythromycin, Esomeprazole magnesium, Penicillins, Sulfonamide derivatives, Amlodipine, Crestor [rosuvastatin calcium], Crestor [rosuvastatin], Erythromycin base, Hydrochlorothiazide, Pravastatin, Sulfa antibiotics, Atorvastatin, Doxycycline, and Zetia [ezetimibe]   History: Past Medical History:  Diagnosis Date   Allergy    Anemia    PMH of    Colon polyp    Crohn's    ileitis/mild; Dr Sharlett Iles   Duodenitis  without mention of hemorrhage 2007   EGD   Fibroid, uterine    Dr Garwin Brothers; G 0 P 0   GERD (gastroesophageal reflux disease)    Goiter    CTS,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   Heart murmur    Hiatal hernia 2002   EGD   Hypertension    Internal hemorrhoids    Migraines    when takes vitamins    Rectal polyp 01/24/2012   TUBULAR ADENOMA   Reflux esophagitis 2007   EGD   Stricture and stenosis of esophagus 2007   EGD   Tubular adenoma of colon    Ulcer    Vitamin D deficiency    Past Surgical History:  Procedure Laterality Date   COLONOSCOPY     polyps;granular ileitis 2006;2007  normal, Dr Sharlett Iles   COLONOSCOPY WITH PROPOFOL  07/2016   Dr.Pyrtle   HEMORRHOID SURGERY     OPERATIVE HYSTEROSCOPY  08/05/2004   Dr Garwin Brothers   OVARIAN CYST REMOVAL     POLYPECTOMY     UPPER GASTROINTESTINAL ENDOSCOPY     Family History  Problem Relation Age of Onset   Diabetes Mother    Hypertension Mother    Heart disease Mother    Lymphoma Father    Diabetes Brother        CAD; Dialysis   Hypertension Brother    Colon cancer Maternal Grandmother    Rectal cancer Paternal Grandmother    Penile cancer Paternal Grandfather    Heart attack Other        1/2 maternal aunts X 2   Cancer Other    COPD Neg Hx    Esophageal cancer Neg Hx    Stomach cancer Neg Hx    Stroke Neg Hx    Colon polyps Neg Hx    Social History   Socioeconomic History   Marital status: Single    Spouse name: Not on file   Number of children: 0   Years of education: Not on file   Highest education level: Not on file  Occupational History   Occupation: TESTER    Employer: [employer]  Tobacco Use   Smoking status: Never   Smokeless tobacco: Never  Vaping Use   Vaping Use: Never used  Substance and Sexual Activity   Alcohol use: No   Drug use: No   Sexual activity: Not Currently    Birth control/protection: Post-menopausal  Other Topics Concern   Not on file  Social History Narrative   Right handed   No caffeine   Some College   Lives alone   retired   Science writer Determinants of Health   Financial Resource Strain: [place]  (08/17/2022)   Overall Financial Resource Strain (CARDIA)    Difficulty of Paying Living Expenses: Not hard at all  Food Insecurity: No Food Insecurity (08/17/2022)   Hunger Vital Sign    Worried About Running Out of Food in the Last Year: Never true    Ran Out of Food in the Last Year: Never true  Transportation Needs: No Transportation Needs (08/17/2022)   PRAPARE - Hydrologist (Medical): No    Lack of Transportation (Non-Medical):  No  Physical Activity: Sufficiently Active (08/17/2022)   Exercise Vital Sign    Days of Exercise per Week: 5 days    Minutes of Exercise per Session: 30 min  Stress: No Stress Concern Present (08/17/2022)   [place]    Feeling  of Stress : Not at all  Social Connections: Moderately Integrated (08/17/2022)   Social Connection and Isolation Panel [NHANES]    Frequency of Communication with Friends and Family: More than three times a week    Frequency of Social Gatherings with Friends and Family: More than three times a week    Attends Religious Services: More than 4 times per year    Active Member of [employer] or Organizations: Yes    Attends Music therapist: More than 4 times per year    Marital Status: Never married  Recent Concern: Social Connections - Socially Isolated (08/17/2022)   Social Connection and Isolation Panel [NHANES]    Frequency of Communication with Friends and Family: More than three times a week    Frequency of Social Gatherings with Friends and Family: Never    Attends Religious Services: Never    Marine scientist or Organizations: No    Attends Music therapist: Never    Marital Status: Never married    Tobacco Counseling Counseling given: Not Answered   Clinical Intake:  Pre-visit preparation completed: Yes  Pain : No/denies pain Pain Score: 0-No pain     BMI - recorded: 26.85 Nutritional Status: BMI 25 -29 Overweight Nutritional Risks: None Diabetes: No  How often do you need to have someone help you when you read instructions, pamphlets, or other written materials from your doctor or pharmacy?: 1 - Never What is the last grade level you completed in school?: HSG; some college  Diabetic? No  Interpreter Needed?: No  Information entered by :: Lisette Abu, LPN.   Activities of Daily Living    08/17/2022   12:01 PM 12/08/2021   10:33 PM  In your  present state of health, do you have any difficulty performing the following activities:  Hearing? 0 1  Vision? 0 0  Difficulty concentrating or making decisions? 0 0  Walking or climbing stairs? 0 0  Dressing or bathing? 0 0  Doing errands, shopping? 0 0  Preparing Food and eating ? N   Using the Toilet? N   In the past six months, have you accidently leaked urine? N   Do you have problems with loss of bowel control? N   Managing your Medications? N   Managing your Finances? N   Housekeeping or managing your Housekeeping? N     Patient Care Team: Binnie Rail, MD as PCP - General (Internal Medicine) Cameron Sprang, MD as Consulting Physician (Neurology) Rutherford Guys, MD as Consulting Physician (Ophthalmology) Elease Hashimoto (Neurology) Keene Breath., MD (Ophthalmology)  Indicate any recent Medical Services you may have received from other than [facility] providers in the past year (date may be approximate).     Assessment:   This is a routine wellness examination for Naz[place].  Hearing/Vision screen Hearing Screening - Comments:: Denies hearing difficulties   Vision Screening - Comments:: Wears rx glasses - up to date with routine eye exams with Barbie Haggis, MD.   Dietary issues and exercise activities discussed: Current Exercise Habits: Home exercise routine;Structured exercise class, Type of exercise: walking;Other - see comments (stationary bike), Time (Minutes): 30, Frequency (Times/Week): 5, Weekly Exercise (Minutes/Week): 150, Intensity: Moderate, Exercise limited by: None identified   Goals Addressed             This Visit's Progress    My goal for 2024 to to continue to exercise and eat healthy.        Depression  Screen    08/17/2022   11:26 AM 06/29/2022    3:26 PM 05/31/2022    2:29 PM 02/09/2021    2:32 PM 02/08/2020   11:04 AM 02/08/2020   11:03 AM 08/07/2019    2:11 PM  PHQ 2/9 Scores  PHQ - 2 Score 0 0 0 0 0 0 0  PHQ- 9 Score    0       Fall  Risk    08/17/2022   11:28 AM 06/29/2022    3:26 PM 06/10/2022    1:58 PM 05/31/2022    2:29 PM 08/31/2021    3:42 PM  [place] in the past year? 0 0 0 0 0  Number falls in past yr: 0 0 0 0 0  Injury with Fall? 0 0 0 0 0  Risk for fall due to : No Fall Risks No Fall Risks  No Fall Risks   Follow up Falls prevention discussed Falls evaluation completed Falls evaluation completed Falls evaluation completed     [place]:  Any stairs in or around the home? No  If so, are there any without handrails? No  Home free of loose throw rugs in walkways, pet beds, electrical cords, etc? Yes  Adequate lighting in your home to reduce risk of falls? Yes   ASSISTIVE DEVICES UTILIZED TO PREVENT FALLS:  Life alert? No  Use of a cane, walker or w/c? No  Grab bars in the bathroom? Yes  Shower chair or bench in shower? Yes  Elevated toilet seat or a handicapped toilet? No   TIMED UP AND GO:  Was the test performed? Yes .  Length of time to ambulate 10 feet: 8 sec.   Gait steady and fast without use of assistive device  Cognitive Function:    06/10/2022    2:00 PM 08/31/2021    3:00 PM 08/26/2020   11:00 AM  MMSE - Mini Mental State Exam  Orientation to time 5 5 5   Orientation to Place 5 5 5   Registration 3 3 3   Attention/ Calculation 4 5 0  Recall 3 3 3   Language- name 2 objects 2 2 2   Language- repeat 1 1 1   Language- follow 3 step command 3 3 3   Language- read & follow direction 1 1 1   Write a sentence 1 1 1   Copy design 1 1 1   Total score 29 30 25       08/13/2019    2:00 PM 12/20/2018    3:00 PM 06/07/2018    9:00 AM  Montreal Cognitive Assessment   Visuospatial/ Executive (0/5) 2 4 4   Naming (0/3) 3 3 3   Attention: Read list of digits (0/2) 0 1 2  Attention: Read list of letters (0/1) 0 1 1  Attention: Serial 7 subtraction starting at 100 (0/3) 0 1 0  Language: Repeat phrase (0/2) 0 2 2  Language : Fluency (0/1) 0 0 0  Abstraction  (0/2) 1 2 0  Delayed Recall (0/5) 4 0 3  Orientation (0/6) 6 6 6   Total 16 20 21   Adjusted Score (based on education) 16        08/17/2022   11:28 AM  6CIT Screen  What Year? 0 points  What month? 0 points  What time? 0 points  Count back from 20 0 points  Months in reverse 0 points  Repeat phrase 0 points  Total Score 0 points    Immunizations  Immunization History  Administered Date(s) Administered   Covid-19, Mrna,Vaccine(Spikevax)37yrs and older 06/14/2022   Influenza,inj,Quad PF,6+ Mos 04/06/2013   Influenza-Unspecified 02/22/2017   Moderna SARS-COV2 Booster Vaccination 03/05/2021   PFIZER(Purple Top)SARS-COV-2 Vaccination 07/09/2019, 07/30/2019   Pfizer Covid-19 Vaccine Bivalent Booster 78yrs & up 02/12/2020   Pneumococcal Conjugate-13 03/23/2018   Pneumococcal Polysaccharide-23 08/22/2019   Td 12/29/2007   Tdap 02/15/2021   Zoster Recombinat (Shingrix) 06/24/2020, 09/04/2020   Zoster, Live 04/02/2013    TDAP status: Up to date  Flu Vaccine status: Declined, Education has been provided regarding the importance of this vaccine but patient still declined. Advised may receive this vaccine at local pharmacy or Health Dept. Aware to provide a copy of the vaccination record if obtained from local pharmacy or Health Dept. Verbalized acceptance and understanding.  Pneumococcal vaccine status: Up to date  Covid-19 vaccine status: Completed vaccines  Qualifies for Shingles Vaccine? Yes   Zostavax completed Yes   Shingrix Completed?: Yes  Screening Tests Health Maintenance  Topic Date Due   Medicare Annual Wellness (AWV)  08/17/2023   MAMMOGRAM  02/26/2024   DEXA SCAN  10/20/2024   DTaP/Tdap/Td (3 - Td or Tdap) 02/16/2031   Pneumonia Vaccine 50+ Years old  Completed   COVID-19 Vaccine  Completed   Hepatitis C Screening  Completed   Zoster Vaccines- Shingrix  Completed   HPV VACCINES  Aged Out   INFLUENZA VACCINE  Discontinued   COLONOSCOPY (Pts 45-70yrs Insurance  coverage will need to be confirmed)  Discontinued    Health Maintenance  There are no preventive care reminders to display for this patient.   Colorectal cancer screening: No longer required.   Mammogram status: Completed 02/25/2022. Repeat every year  Bone Density status: Completed 10/20/2021. Results reflect: Bone density results: OSTEOPENIA. Repeat every 3 years.  Lung Cancer Screening: (Low Dose CT Chest recommended if Age 25-80 years, 30 pack-year currently smoking OR have quit w/in 15years.) does not qualify.   Lung Cancer Screening Referral: no  Additional Screening:  Hepatitis C Screening: does qualify; Completed: 10/27/2015  Vision Screening: Recommended annual ophthalmology exams for early detection of glaucoma and other disorders of the eye. Is the patient up to date with their annual eye exam?  Yes  Who is the provider or what is the name of the office in which the patient attends annual eye exams? Chilton Greathouse, MD. If pt is not established with a provider, would they like to be referred to a provider to establish care? No .   Dental Screening: Recommended annual dental exams for proper oral hygiene  Community Resource Referral / Chronic Care Management: CRR required this visit?  No   CCM required this visit?  No      Plan:     I have personally reviewed and noted the following in the patient's chart:   Medical and social history Use of alcohol, tobacco or illicit drugs  Current medications and supplements including opioid prescriptions. Patient is not currently taking opioid prescriptions. Functional ability and status Nutritional status Physical activity Advanced directives List of other physicians Hospitalizations, surgeries, and ER visits in previous 12 months Vitals Screenings to include cognitive, depression, and falls Referrals and appointments  In addition, I have reviewed and discussed with patient certain preventive protocols, quality metrics,  and best practice recommendations. A written personalized care plan for preventive services as well as general preventive health recommendations were provided to patient.     Sheral Flow, LPN   [id number]  Nurse Notes:  Normal cognitive status assessed by direct observation by this Nurse Health Advisor. No abnormalities found.

## 2022-09-02 ENCOUNTER — Encounter: Payer: Self-pay | Admitting: Podiatry

## 2022-09-02 ENCOUNTER — Ambulatory Visit: Payer: Medicare Other | Admitting: Podiatry

## 2022-09-02 DIAGNOSIS — M79674 Pain in right toe(s): Secondary | ICD-10-CM

## 2022-09-02 DIAGNOSIS — M79675 Pain in left toe(s): Secondary | ICD-10-CM | POA: Diagnosis not present

## 2022-09-02 DIAGNOSIS — B351 Tinea unguium: Secondary | ICD-10-CM | POA: Diagnosis not present

## 2022-09-02 NOTE — Progress Notes (Signed)
Subjective:   Patient ID: Karen Evans, female   DOB: 72 y.o.   MRN: [id number]   HPI Patient presents with significant elongation of nailbeds 1-5 both feet that are thick and incurvated and become painful   ROS      Objective:  Physical Exam  Chronic mycotic nail infection 1-5 both feet with pain that are thick yellow and brittle     Assessment:  Above chronic nail condition     Plan:  Debrided nailbeds 1-5 both feet no angiogenic bleeding noted

## 2022-10-04 DIAGNOSIS — H40013 Open angle with borderline findings, low risk, bilateral: Secondary | ICD-10-CM | POA: Diagnosis not present

## 2022-10-04 DIAGNOSIS — H2513 Age-related nuclear cataract, bilateral: Secondary | ICD-10-CM | POA: Diagnosis not present

## 2022-10-04 DIAGNOSIS — H2512 Age-related nuclear cataract, left eye: Secondary | ICD-10-CM | POA: Diagnosis not present

## 2022-11-15 ENCOUNTER — Other Ambulatory Visit: Payer: Self-pay | Admitting: Internal Medicine

## 2022-11-30 ENCOUNTER — Ambulatory Visit: Payer: Medicare Other | Admitting: Internal Medicine

## 2022-12-09 ENCOUNTER — Ambulatory Visit: Payer: Medicare Other | Admitting: Physician Assistant

## 2022-12-09 ENCOUNTER — Encounter: Payer: Self-pay | Admitting: Physician Assistant

## 2022-12-09 VITALS — BP 132/81 | HR 60 | Resp 18 | Ht 63.0 in | Wt 150.0 lb

## 2022-12-09 DIAGNOSIS — F0670 Mild neurocognitive disorder due to known physiological condition without behavioral disturbance: Secondary | ICD-10-CM

## 2022-12-09 DIAGNOSIS — I999 Unspecified disorder of circulatory system: Secondary | ICD-10-CM | POA: Diagnosis not present

## 2022-12-09 NOTE — Progress Notes (Signed)
Assessment/Plan:   Mild cognitive impairment, likely of vascular etiology  Karen Evans is a very pleasant 72 y.o. RH female with a history of HTN, HLD, Prediabetes, recent admission for TIA symptoms versus complex migraine 11/2021 without recurrence and with negative neuro workup  presenting today in follow-up for evaluation of memory loss. Patient is not on anti dementia medication after not tolerating donepezil in the past (very vivid dreams).  Memory is stable, today's MMSE is 30/30.     Recommendations:   Follow up in 6 months. Recommend good control of cardiovascular risk factors.  She is on baby aspirin daily Continue to control mood as per PCP Increase socialization No indication for antidementia medication at this time.    Subjective:   This patient is here alone. Previous records as well as any outside records available were reviewed prior to todays visit.   Patient was last seen on 06/10/2022 with MMSE 29/30     Any changes in memory since last visit? "About the same"  She has some difficulty remembering recent conversation, but is able to remember people's names that they are familiar to her.  She likes to watch game shows.  She continues to remain active, doing drawing, painting, attending [employer], going to concerts. repeats oneself?  Endorsed Disoriented when walking into a room?  Patient denies   Leaving objects in unusual places?  Patient denies   Wandering behavior?   denies   Any personality changes since last visit?  denies   Any worsening depression?: denies   Hallucinations or paranoia?  denies   Seizures?   denies    Any sleep changes? Sleeps well .  Denies vivid dreams, REM behavior or sleepwalking   Sleep apnea?   denies    Any hygiene concerns?   denies   Independent of bathing and dressing?  Endorsed  Does the patient needs help with medications? Patient is in charge   Who is in charge of the finances?  Patient is in charge      Any  changes in appetite?  denies     Patient have trouble swallowing?  denies   Does the patient cook?  Yes  any kitchen accidents such as leaving the stove on?   denies   Any headaches?    denies   Vision changes? denies Chronic pain?  denies   Ambulates with difficulty?    denies    Recent falls or head injuries?  denies      Unilateral weakness, numbness or tingling?  Not since July 2023.  She taken aspirin daily. Her any tremors?  denies   Any anosmia?    denies   Any incontinence of urine?  denies   Any bowel dysfunction?  denies      Patient lives alone  Does the patient drive?  Yes, denies getting lost   History on Initial Assessment 06/07/2018: This is a 72 year old right-handed woman with a history of hypertension, hyperlipidemia, presenting for evaluation of memory concerns. She reports that she started noticing memory changes "when they started letting people go at the plant in 2016." She noticed she was forgetting things. She reported symptoms to Dr. Lawerance Bach in November 2019 where she would leave food out without putting it away, one time she left ice cream container out after eating it, and left a doggie bag or groceries in her car. She has burnt food she forgot was cooking and now has to make herself stay in the kitchen.  She lost her keys one time and has started putting them in one place. She denies getting lost driving. No missed bills or medications. 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 She lives alone, her family (mother and brother) live in [place]. She has told them of her memory concerns, "they don't think I need to come here." Her maternal aunt had memory issues. No history of concussions or alcohol use.   She has occasional right hand and left finger tingling. She denies any headaches, dizziness, diplopia, dysarthria/dysphagia, neck/back pain, bowel/bladder dysfunction, anosmia, or tremors. No  falls. Sleep is good. Mood "I think it's good." There is no family to corroborate history but there does not appear to be any paranoia or hallucinations.    Diagnostic Data: MRI brain without contrast done 07/2018 which did not show any acute changes. There was moderately advanced chronic microvascular disease.     Past Medical History:  Diagnosis Date   Allergy    Anemia    PMH of    Colon polyp    Crohn's    ileitis/mild; Dr [name]   Duodenitis without mention of hemorrhage 2007   EGD   Fibroid, uterine    Dr Cherly Hensen; G 0 P 0   GERD (gastroesophageal reflux disease)    Goiter    CTS,RUE > LUE   Heart murmur    Hiatal hernia 2002   EGD   Hypertension    Internal hemorrhoids    Migraines    when takes vitamins    Rectal polyp 01/24/2012   TUBULAR ADENOMA   Reflux esophagitis 2007   EGD   Stricture and stenosis of esophagus 2007   EGD   Tubular adenoma of colon    Ulcer    Vitamin D deficiency      Past Surgical History:  Procedure Laterality Date   COLONOSCOPY     polyps;granular ileitis 2006;2007 normal, Dr [name]   COLONOSCOPY WITH PROPOFOL  07/2016   Dr.Pyrtle   HEMORRHOID SURGERY     OPERATIVE HYSTEROSCOPY  08/05/2004   Dr Cherly Hensen   OVARIAN CYST REMOVAL     POLYPECTOMY     UPPER GASTROINTESTINAL ENDOSCOPY       PREVIOUS MEDICATIONS:   CURRENT MEDICATIONS:  Outpatient Encounter Medications as of 12/09/2022  Medication Sig   acetaminophen (TYLENOL) 500 MG tablet Take 250-500 mg by mouth every 6 (six) hours as needed for headache (pain).   aspirin EC 81 MG tablet Take 1 tablet (81 mg total) by mouth daily. Swallow whole.   atenolol (TENORMIN) 25 MG tablet TAKE 1 TABLET BY MOUTH DAILY   betamethasone valerate ointment (VALISONE) 0.1 % Apply a pea sized amount BID for up to 2 weeks as needed (Patient taking differently: Apply 1 Application topically 2 (two) times daily as needed (irritation).)   Calcium Carbonate-Vitamin D (CALTRATE 600+D PO) Take 1  tablet by mouth daily.    cholecalciferol (VITAMIN D) 1000 units tablet Take 1,000 Units by mouth daily.   fexofenadine (ALLEGRA) 180 MG tablet Take 180 mg by mouth daily.   Fluticasone Propionate (FLONASE NA) Place 1 spray into the nose daily as needed (congestion).   hydrocortisone (ANUSOL-HC) 25 MG suppository Place 25 mg rectally as needed for hemorrhoids or anal itching.   Lactase (LACTAID PO) Take 1 tablet by mouth 2 (two) times daily as needed (stomach upset/ gas).    Multiple Vitamin (MULTIVITAMIN WITH MINERALS) TABS  tablet Take 1 tablet by mouth daily.   Na Sulfate-K Sulfate-Mg Sulf 17.5-3.13-1.6 GM/177ML SOLN Take 1 kit by mouth as directed. May use generic Suprep, no prior authorization. Take as directed.   Propylene Glycol (SYSTANE COMPLETE OP) Place 1 drop into both eyes at bedtime.   telmisartan (MICARDIS) 80 MG tablet Take 1 tablet (80 mg total) by mouth daily.   No facility-administered encounter medications on file as of 12/09/2022.     Objective:     PHYSICAL EXAMINATION:    VITALS:   Vitals:   12/09/22 1415  BP: 132/81  Pulse: 60  Resp: 18  SpO2: 97%  Weight: 150 lb (68 kg)  Height: 5\' 3"  (1.6 m)    GEN:  The patient appears stated age and is in NAD. HEENT:  Normocephalic, atraumatic.   Neurological examination:  General: NAD, well-groomed, appears stated age. Orientation: The patient is alert. Oriented to person, place and date Cranial nerves: There is good facial symmetry.The speech is fluent and clear. No aphasia or dysarthria. Fund of knowledge is appropriate. Recent memory impaired and remote memory is normal.  Attention and concentration are normal.  Able to name objects and repeat phrases.  Hearing is intact to conversational tone .  Delayed recall 3/3.  Sensation: Sensation is intact to light touch throughout Motor: Strength is at least antigravity x4. DTR's 2/4 in UE/LE      08/13/2019    2:00 PM 12/20/2018    3:00 PM 06/07/2018    9:00 AM   Montreal Cognitive Assessment   Visuospatial/ Executive (0/5) 2 4 4   Naming (0/3) 3 3 3   Attention: Read list of digits (0/2) 0 1 2  Attention: Read list of letters (0/1) 0 1 1  Attention: Serial 7 subtraction starting at 100 (0/3) 0 1 0  Language: Repeat phrase (0/2) 0 2 2  Language : Fluency (0/1) 0 0 0  Abstraction (0/2) 1 2 0  Delayed Recall (0/5) 4 0 3  Orientation (0/6) 6 6 6   Total 16 20 21   Adjusted Score (based on education) 16         12/09/2022    4:00 PM 06/10/2022    2:00 PM 08/31/2021    3:00 PM  MMSE - Mini Mental State Exam  Orientation to time 5 5 5   Orientation to Place 5 5 5   Registration 3 3 3   Attention/ Calculation 5 4 5   Recall 3 3 3   Language- name 2 objects 2 2 2   Language- repeat 1 1 1   Language- follow 3 step command 3 3 3   Language- read & follow direction 1 1 1   Write a sentence 1 1 1   Copy design 1 1 1   Total score 30 29 30        Movement examination: Tone: There is normal tone in the UE/LE Abnormal movements:  no tremor.  No myoclonus.  No asterixis.   Coordination:  There is no decremation with RAM's. Normal finger to nose  Gait and Station: The patient has no difficulty arising out of a deep-seated chair without the use of the hands. The patient's stride length is good.  Gait is cautious and narrow.   Thank you for allowing [place] the opportunity to participate in the care of this nice patient. Please do not hesitate to contact us for any questions or concerns.   Total time spent on today's visit was 20 minutes dedicated to this patient today, preparing to see patient, examining the patient, ordering tests  and/or medications and counseling the patient, documenting clinical information in the EHR or other health record, independently interpreting results and communicating results to the patient/family, discussing treatment and goals, answering patient's questions and coordinating care.  Cc:  Pincus Sanes, MD  Marlowe Kays 12/09/2022 4:46 PM

## 2022-12-09 NOTE — Patient Instructions (Signed)
It was a pleasure to see you today at our office.   Recommendations:  Meds: Follow up in 6 months Continue to monitor your Blood pressure, continue aspirin daily  Continue to stay hydrated  Continue to eat healthy Continue brain excercises   RECOMMENDATIONS FOR ALL PATIENTS WITH MEMORY PROBLEMS: 1. Continue to exercise (Recommend 30 minutes of walking everyday, or 3 hours every week) 2. Increase social interactions - continue going to [place] and enjoy social gatherings with friends and family 3. Eat healthy, avoid fried foods and eat more fruits and vegetables 4. Maintain adequate blood pressure, blood sugar, and blood cholesterol level. Reducing the risk of stroke and cardiovascular disease also helps promoting better memory. 5. Avoid stressful situations. Live a simple life and avoid aggravations. Organize your time and prepare for the next day in anticipation. 6. Sleep well, avoid any interruptions of sleep and avoid any distractions in the bedroom that may interfere with adequate sleep quality 7. Avoid sugar, avoid sweets as there is a strong link between excessive sugar intake, diabetes, and cognitive impairment We discussed the Mediterranean diet, which has been shown to help patients reduce the risk of progressive memory disorders and reduces cardiovascular risk. This includes eating fish, eat fruits and green leafy vegetables, nuts like almonds and hazelnuts, walnuts, and also use olive oil. Avoid fast foods and fried foods as much as possible. Avoid sweets and sugar as sugar use has been linked to worsening of memory function.  There is always a concern of gradual progression of memory problems. If this is the case, then we may need to adjust level of care according to patient needs. Support, both to the patient and caregiver, should then be put into place.    FALL PRECAUTIONS: Be cautious when walking. Scan the area for obstacles that may increase the risk of trips and falls. When  getting up in the mornings, sit up at the edge of the bed for a few minutes before getting out of bed. Consider elevating the bed at the head end to avoid drop of blood pressure when getting up. Walk always in a well-lit room (use night lights in the walls). Avoid area rugs or power cords from appliances in the middle of the walkways. Use a walker or a cane if necessary and consider physical therapy for balance exercise. Get your eyesight checked regularly.   HOME SAFETY: Consider the safety of the kitchen when operating appliances like stoves, microwave oven, and blender. Consider having supervision and share cooking responsibilities until no longer able to participate in those. Accidents with firearms and other hazards in the house should be identified and addressed as well       Mediterranean Diet A Mediterranean diet refers to food and lifestyle choices that are based on the traditions of countries located on the [employer]. This way of eating has been shown to help prevent certain conditions and improve outcomes for people who have chronic diseases, like kidney disease and heart disease. What are tips for following this plan? Lifestyle  Cook and eat meals together with your family, when possible. Drink enough fluid to keep your urine clear or pale yellow. Be physically active every day. This includes: Aerobic exercise like running or swimming. Leisure activities like gardening, walking, or housework. Get 7-8 hours of sleep each night. If recommended by your health care provider, drink red wine in moderation. This means 1 glass a day for nonpregnant women and 2 glasses a day for men. A glass of  wine equals 5 oz (150 mL). Reading food labels  Check the serving size of packaged foods. For foods such as rice and pasta, the serving size refers to the amount of cooked product, not dry. Check the total fat in packaged foods. Avoid foods that have saturated fat or trans fats. Check the  ingredients list for added sugars, such as corn syrup. Shopping  At the grocery store, buy most of your food from the areas near the walls of the store. This includes: Fresh fruits and vegetables (produce). Grains, beans, nuts, and seeds. Some of these may be available in unpackaged forms or large amounts (in bulk). Fresh seafood. Poultry and eggs. Low-fat dairy products. Buy whole ingredients instead of prepackaged foods. Buy fresh fruits and vegetables in-season from local farmers markets. Buy frozen fruits and vegetables in resealable bags. If you do not have access to quality fresh seafood, buy precooked frozen shrimp or canned fish, such as tuna, salmon, or sardines. Buy small amounts of raw or cooked vegetables, salads, or olives from the deli or salad bar at your store. Stock your pantry so you always have certain foods on hand, such as olive oil, canned tuna, canned tomatoes, rice, pasta, and beans. Cooking  Cook foods with extra-virgin olive oil instead of using butter or other vegetable oils. Have meat as a side dish, and have vegetables or grains as your main dish. This means having meat in small portions or adding small amounts of meat to foods like pasta or stew. Use beans or vegetables instead of meat in common dishes like chili or lasagna. Experiment with different cooking methods. Try roasting or broiling vegetables instead of steaming or sauteing them. Add frozen vegetables to soups, stews, pasta, or rice. Add nuts or seeds for added healthy fat at each meal. You can add these to yogurt, salads, or vegetable dishes. Marinate fish or vegetables using olive oil, lemon juice, garlic, and fresh herbs. Meal planning  Plan to eat 1 vegetarian meal one day each week. Try to work up to 2 vegetarian meals, if possible. Eat seafood 2 or more times a week. Have healthy snacks readily available, such as: Vegetable sticks with hummus. Greek yogurt. Fruit and nut trail mix. Eat  balanced meals throughout the week. This includes: Fruit: 2-3 servings a day Vegetables: 4-5 servings a day Low-fat dairy: 2 servings a day Fish, poultry, or lean meat: 1 serving a day Beans and legumes: 2 or more servings a week Nuts and seeds: 1-2 servings a day Whole grains: 6-8 servings a day Extra-virgin olive oil: 3-4 servings a day Limit red meat and sweets to only a few servings a month What are my food choices? Mediterranean diet Recommended Grains: Whole-grain pasta. Brown rice. Bulgar wheat. Polenta. Couscous. Whole-wheat bread. Orpah Cobb. Vegetables: Artichokes. Beets. Broccoli. Cabbage. Carrots. Eggplant. Green beans. Chard. Kale. Spinach. Onions. Leeks. Peas. Squash. Tomatoes. Peppers. Radishes. Fruits: Apples. Apricots. Avocado. Berries. Bananas. Cherries. Dates. Figs. Grapes. Lemons. Melon. Oranges. Peaches. Plums. Pomegranate. Meats and other protein foods: Beans. Almonds. Sunflower seeds. Pine nuts. Peanuts. Cod. Salmon. Scallops. Shrimp. Tuna. Tilapia. Clams. Oysters. Eggs. Dairy: Low-fat milk. Cheese. Greek yogurt. Beverages: Water. Red wine. Herbal tea. Fats and oils: Extra virgin olive oil. Avocado oil. Grape seed oil. Sweets and desserts: [place] yogurt with honey. Baked apples. Poached pears. Trail mix. Seasoning and other foods: Basil. Cilantro. Coriander. Cumin. Mint. Parsley. Sage. Rosemary. Tarragon. Garlic. Oregano. Thyme. Pepper. Balsalmic vinegar. Tahini. Hummus. Tomato sauce. Olives. Mushrooms. Limit these Grains: Prepackaged pasta  or rice dishes. Prepackaged cereal with added sugar. Vegetables: Deep fried potatoes (french fries). Fruits: Fruit canned in syrup. Meats and other protein foods: Beef. Pork. Lamb. Poultry with skin. Hot dogs. Tomasa Blase. Dairy: Ice cream. Sour cream. Whole milk. Beverages: Juice. Sugar-sweetened soft drinks. Beer. Liquor and spirits. Fats and oils: Butter. Canola oil. Vegetable oil. Beef fat (tallow). Lard. Sweets and desserts:  Cookies. Cakes. Pies. Candy. Seasoning and other foods: Mayonnaise. Premade sauces and marinades. The items listed may not be a complete list. Talk with your dietitian about what dietary choices are right for you. Summary The Mediterranean diet includes both food and lifestyle choices. Eat a variety of fresh fruits and vegetables, beans, nuts, seeds, and whole grains. Limit the amount of red meat and sweets that you eat. Talk with your health care provider about whether it is safe for you to drink red wine in moderation. This means 1 glass a day for nonpregnant women and 2 glasses a day for men. A glass of wine equals 5 oz (150 mL). This information is not intended to replace advice given to you by your health care provider. Make sure you discuss any questions you have with your health care provider. Document Released: 12/25/2015 Document Revised: 01/27/2016 Document Reviewed: 12/25/2015 Elsevier Interactive Patient Education  2017 [employer].

## 2022-12-13 ENCOUNTER — Encounter: Payer: Self-pay | Admitting: Internal Medicine

## 2022-12-13 NOTE — Progress Notes (Unsigned)
Subjective:    Patient ID: Karen Evans, female    DOB: Apr 23, 1951, 72 y.o.   MRN: [id number]     HPI Karen Evans is here for follow up of her chronic medical problems.  Rides a  stationary bike.  Eating ok.  No concerns.   Medications and allergies reviewed with patient and updated if appropriate.  Current Outpatient Medications on File Prior to Visit  Medication Sig Dispense Refill   acetaminophen (TYLENOL) 500 MG tablet Take 250-500 mg by mouth every 6 (six) hours as needed for headache (pain).     aspirin EC 81 MG tablet Take 1 tablet (81 mg total) by mouth daily. Swallow whole. 30 tablet 12   atenolol (TENORMIN) 25 MG tablet TAKE 1 TABLET BY MOUTH DAILY 90 tablet 3   betamethasone valerate ointment (VALISONE) 0.1 % Apply a pea sized amount BID for up to 2 weeks as needed (Patient taking differently: Apply 1 Application topically 2 (two) times daily as needed (irritation).) 30 g 0   Calcium Carbonate-Vitamin D (CALTRATE 600+D PO) Take 1 tablet by mouth daily.      cholecalciferol (VITAMIN D) 1000 units tablet Take 1,000 Units by mouth daily.     fexofenadine (ALLEGRA) 180 MG tablet Take 180 mg by mouth daily.     Fluticasone Propionate (FLONASE NA) Place 1 spray into the nose daily as needed (congestion).     gatifloxacin (ZYMAXID) 0.5 % SOLN SMARTSIG:In Eye(s)     hydrocortisone (ANUSOL-HC) 25 MG suppository Place 25 mg rectally as needed for hemorrhoids or anal itching.     ketorolac (ACULAR) 0.5 % ophthalmic solution Place 1 drop into the left eye 4 (four) times daily.     Lactase (LACTAID PO) Take 1 tablet by mouth 2 (two) times daily as needed (stomach upset/ gas).      Multiple Vitamin (MULTIVITAMIN WITH MINERALS) TABS tablet Take 1 tablet by mouth daily.     Na Sulfate-K Sulfate-Mg Sulf 17.5-3.13-1.6 GM/177ML SOLN Take 1 kit by mouth as directed. May use generic Suprep, no prior authorization. Take as directed. 354 mL 0   prednisoLONE acetate (PRED FORTE) 1 % ophthalmic  suspension SMARTSIG:In Eye(s)     Propylene Glycol (SYSTANE COMPLETE OP) Place 1 drop into both eyes at bedtime.     telmisartan (MICARDIS) 80 MG tablet Take 1 tablet (80 mg total) by mouth daily. 30 tablet 0   No current facility-administered medications on file prior to visit.     Review of Systems  Constitutional:  Negative for fever.  Respiratory:  Negative for cough, shortness of breath and wheezing.   Cardiovascular:  Negative for chest pain, palpitations and leg swelling.  Neurological:  Positive for light-headedness (occ). Negative for headaches.       Objective:   Vitals:   12/14/22 1515  BP: 128/78  Pulse: 60  Temp: 98.1 F (36.7 C)  SpO2: 98%   BP Readings from Last 3 Encounters:  12/14/22 128/78  12/09/22 132/81  08/17/22 122/62   Wt Readings from Last 3 Encounters:  12/14/22 148 lb 3.2 oz (67.2 kg)  12/09/22 150 lb (68 kg)  08/17/22 151 lb 9.6 oz (68.8 kg)   Body mass index is 26.25 kg/m.    Physical Exam Constitutional:      General: She is not in acute distress.    Appearance: Normal appearance.  HENT:     Head: Normocephalic and atraumatic.  Eyes:     Conjunctiva/sclera: Conjunctivae normal.  Cardiovascular:  Rate and Rhythm: Normal rate and regular rhythm.     Heart sounds: Normal heart sounds.  Pulmonary:     Effort: Pulmonary effort is normal. No respiratory distress.     Breath sounds: Normal breath sounds. No wheezing.  Musculoskeletal:     Cervical back: Neck supple.     Right lower leg: No edema.     Left lower leg: No edema.  Lymphadenopathy:     Cervical: No cervical adenopathy.  Skin:    General: Skin is warm and dry.     Findings: No rash.  Neurological:     Mental Status: She is alert. Mental status is at baseline.  Psychiatric:        Mood and Affect: Mood normal.        Behavior: Behavior normal.        Lab Results  Component Value Date   WBC 6.2 12/08/2021   HGB 15.6 (H) 12/08/2021   HCT 46.0 12/08/2021    PLT 218 12/08/2021   GLUCOSE 87 05/31/2022   CHOL 206 (H) 05/31/2022   TRIG 145.0 05/31/2022   HDL 68.60 05/31/2022   LDLDIRECT 139.0 02/09/2021   LDLCALC 109 (H) 05/31/2022   ALT 31 05/31/2022   AST 23 05/31/2022   NA 139 05/31/2022   K 4.1 05/31/2022   CL 102 05/31/2022   CREATININE 0.75 05/31/2022   BUN 12 05/31/2022   CO2 30 05/31/2022   TSH 0.769 12/09/2021   INR 1.0 12/08/2021   HGBA1C 6.4 05/31/2022     Assessment & Plan:    See Problem List for Assessment and Plan of chronic medical problems.

## 2022-12-13 NOTE — Patient Instructions (Addendum)
      Blood work was ordered.   The lab is on the first floor.    Medications changes include :   none      Return in about 6 months (around 06/16/2023) for Physical Exam.

## 2022-12-14 ENCOUNTER — Ambulatory Visit: Payer: Medicare Other | Admitting: Internal Medicine

## 2022-12-14 VITALS — BP 128/78 | HR 60 | Temp 98.1°F | Ht 63.0 in | Wt 148.2 lb

## 2022-12-14 DIAGNOSIS — E049 Nontoxic goiter, unspecified: Secondary | ICD-10-CM

## 2022-12-14 DIAGNOSIS — E7849 Other hyperlipidemia: Secondary | ICD-10-CM | POA: Diagnosis not present

## 2022-12-14 DIAGNOSIS — R7303 Prediabetes: Secondary | ICD-10-CM | POA: Diagnosis not present

## 2022-12-14 DIAGNOSIS — I10 Essential (primary) hypertension: Secondary | ICD-10-CM

## 2022-12-14 NOTE — Assessment & Plan Note (Addendum)
Chronic Stable No dysphagia Tsh today

## 2022-12-14 NOTE — Assessment & Plan Note (Addendum)
Chronic Has not tolerated crestor, pravastatin, atorvastatin and zetia Discussed injectable medication, but she is not sure if she could do that Check lipids  Discussed injectable - deferred for now

## 2022-12-14 NOTE — Assessment & Plan Note (Signed)
Chronic Check a1c Low sugar / carb diet Stressed regular exercise  

## 2022-12-14 NOTE — Assessment & Plan Note (Addendum)
Chronic Blood pressure is well-controlled Cmp, cbc Continue atenolol 25 mg daily, telmisartan 80 mg daily

## 2022-12-20 ENCOUNTER — Ambulatory Visit: Payer: Medicare Other | Admitting: Podiatry

## 2022-12-20 ENCOUNTER — Encounter: Payer: Self-pay | Admitting: Podiatry

## 2022-12-20 DIAGNOSIS — M79674 Pain in right toe(s): Secondary | ICD-10-CM | POA: Diagnosis not present

## 2022-12-20 DIAGNOSIS — B351 Tinea unguium: Secondary | ICD-10-CM

## 2022-12-20 DIAGNOSIS — M79675 Pain in left toe(s): Secondary | ICD-10-CM | POA: Diagnosis not present

## 2022-12-20 NOTE — Progress Notes (Signed)
Subjective:   Patient ID: Karen Evans, female   DOB: 72 y.o.   MRN: [id number]   HPI Patient presents with elongated nailbeds 1-5 both feet thick yellow brittle and can become painful   ROS      Objective:  Physical Exam  Neurovascular status intact thick yellow brittle nailbeds with pain 1-5 both feet     Assessment:  Mycotic nail infection with pain 1-5 both feet     Plan:  Debridement painful nailbeds 1-5 both feet no angiogenic bleeding reappoint routine care

## 2022-12-23 DIAGNOSIS — H2511 Age-related nuclear cataract, right eye: Secondary | ICD-10-CM | POA: Diagnosis not present

## 2022-12-24 DIAGNOSIS — H2512 Age-related nuclear cataract, left eye: Secondary | ICD-10-CM | POA: Diagnosis not present

## 2023-01-13 DIAGNOSIS — H52209 Unspecified astigmatism, unspecified eye: Secondary | ICD-10-CM | POA: Diagnosis not present

## 2023-01-13 DIAGNOSIS — H2512 Age-related nuclear cataract, left eye: Secondary | ICD-10-CM | POA: Diagnosis not present

## 2023-01-13 DIAGNOSIS — H2511 Age-related nuclear cataract, right eye: Secondary | ICD-10-CM | POA: Diagnosis not present

## 2023-01-21 ENCOUNTER — Other Ambulatory Visit: Payer: Self-pay | Admitting: Internal Medicine

## 2023-01-21 DIAGNOSIS — Z1231 Encounter for screening mammogram for malignant neoplasm of breast: Secondary | ICD-10-CM

## 2023-02-09 ENCOUNTER — Other Ambulatory Visit: Payer: Self-pay | Admitting: Internal Medicine

## 2023-02-24 DIAGNOSIS — H209 Unspecified iridocyclitis: Secondary | ICD-10-CM | POA: Diagnosis not present

## 2023-02-28 ENCOUNTER — Ambulatory Visit
Admission: RE | Admit: 2023-02-28 | Discharge: 2023-02-28 | Disposition: A | Discharge status: Home / Self Care | Payer: Medicare Other | Source: Ambulatory Visit | Attending: Internal Medicine | Admitting: Internal Medicine

## 2023-02-28 DIAGNOSIS — Z1231 Encounter for screening mammogram for malignant neoplasm of breast: Secondary | ICD-10-CM

## 2023-03-09 DIAGNOSIS — H209 Unspecified iridocyclitis: Secondary | ICD-10-CM | POA: Diagnosis not present

## 2023-03-23 DIAGNOSIS — H209 Unspecified iridocyclitis: Secondary | ICD-10-CM | POA: Diagnosis not present

## 2023-03-25 ENCOUNTER — Encounter: Payer: Self-pay | Admitting: Podiatry

## 2023-03-25 ENCOUNTER — Ambulatory Visit: Payer: Medicare Other | Admitting: Podiatry

## 2023-03-25 DIAGNOSIS — M79674 Pain in right toe(s): Secondary | ICD-10-CM | POA: Diagnosis not present

## 2023-03-25 DIAGNOSIS — M79675 Pain in left toe(s): Secondary | ICD-10-CM

## 2023-03-25 DIAGNOSIS — B351 Tinea unguium: Secondary | ICD-10-CM | POA: Diagnosis not present

## 2023-03-27 NOTE — Progress Notes (Signed)
Subjective:   Patient ID: Karen Evans, female   DOB: 72 y.o.   MRN: [id number]   HPI Patient presents with painful nailbeds 1-5 both feet that are hard to wear shoe gear with   ROS      Objective:  Physical Exam  Neurovascular status intact with thick yellow brittle nailbeds 1-5 both feet that are painful when pressed dorsally     Assessment:  Chronic mycotic nail infection 1-5 both feet     Plan:  Debridement painful nailbeds 1-5 both feet no iatrogenic bleeding reappoint routine care

## 2023-04-26 DIAGNOSIS — L853 Xerosis cutis: Secondary | ICD-10-CM | POA: Diagnosis not present

## 2023-04-26 DIAGNOSIS — L816 Other disorders of diminished melanin formation: Secondary | ICD-10-CM | POA: Diagnosis not present

## 2023-04-26 DIAGNOSIS — L814 Other melanin hyperpigmentation: Secondary | ICD-10-CM | POA: Diagnosis not present

## 2023-04-26 DIAGNOSIS — L821 Other seborrheic keratosis: Secondary | ICD-10-CM | POA: Diagnosis not present

## 2023-04-26 DIAGNOSIS — L2089 Other atopic dermatitis: Secondary | ICD-10-CM | POA: Diagnosis not present

## 2023-04-26 DIAGNOSIS — L918 Other hypertrophic disorders of the skin: Secondary | ICD-10-CM | POA: Diagnosis not present

## 2023-04-26 DIAGNOSIS — D225 Melanocytic nevi of trunk: Secondary | ICD-10-CM | POA: Diagnosis not present

## 2023-05-31 ENCOUNTER — Ambulatory Visit: Payer: Medicare Other | Admitting: Internal Medicine

## 2023-06-06 ENCOUNTER — Encounter: Payer: Self-pay | Admitting: Internal Medicine

## 2023-06-06 DIAGNOSIS — E0590 Thyrotoxicosis, unspecified without thyrotoxic crisis or storm: Secondary | ICD-10-CM | POA: Insufficient documentation

## 2023-06-06 NOTE — Patient Instructions (Addendum)
      Blood work was ordered.       Medications changes include :   None    A referral was ordered and someone will call you to schedule an appointment.     Return in about 6 months (around 12/05/2023) for Physical Exam.

## 2023-06-06 NOTE — Progress Notes (Unsigned)
Subjective:    Patient ID: Karen Evans, female    DOB: June 17, 1950, 73 y.o.   MRN: [id number]     HPI [name] is here for follow up of her chronic medical problems.  Having night sweats - they wakes her up.  She has had them continuously since menopause.  She does have a take off a cover in the middle of the night.    [place].  Walks circles inside the house.   Medications and allergies reviewed with patient and updated if appropriate.  Current Outpatient Medications on File Prior to Visit  Medication Sig Dispense Refill   acetaminophen (TYLENOL) 500 MG tablet Take 250-500 mg by mouth every 6 (six) hours as needed for headache (pain).     aspirin EC 81 MG tablet Take 1 tablet (81 mg total) by mouth daily. Swallow whole. 30 tablet 12   atenolol (TENORMIN) 25 MG tablet TAKE 1 TABLET BY MOUTH DAILY 90 tablet 3   betamethasone valerate ointment (VALISONE) 0.1 % Apply a pea sized amount BID for up to 2 weeks as needed (Patient taking differently: Apply 1 Application topically 2 (two) times daily as needed (irritation).) 30 g 0   Calcium Carbonate-Vitamin D (CALTRATE 600+D PO) Take 1 tablet by mouth daily.      cholecalciferol (VITAMIN D) 1000 units tablet Take 1,000 Units by mouth daily.     fexofenadine (ALLEGRA) 180 MG tablet Take 180 mg by mouth daily.     Fluticasone Propionate (FLONASE NA) Place 1 spray into the nose daily as needed (congestion).     hydrocortisone (ANUSOL-HC) 25 MG suppository Place 25 mg rectally as needed for hemorrhoids or anal itching.     ketorolac (ACULAR) 0.5 % ophthalmic solution Place 1 drop into the left eye 4 (four) times daily.     Lactase (LACTAID PO) Take 1 tablet by mouth 2 (two) times daily as needed (stomach upset/ gas).      Multiple Vitamin (MULTIVITAMIN WITH MINERALS) TABS tablet Take 1 tablet by mouth daily.     Propylene Glycol (SYSTANE COMPLETE OP) Place 1 drop into both eyes at bedtime.     telmisartan (MICARDIS) 80  MG tablet TAKE 1 TABLET BY MOUTH DAILY 90 tablet 3   No current facility-administered medications on file prior to visit.     Review of Systems  Constitutional:  Positive for diaphoresis (night time only). Negative for fever.  Respiratory:  Positive for cough (dry throat). Negative for shortness of breath and wheezing.   Cardiovascular:  Negative for chest pain, palpitations and leg swelling.  Neurological:  Positive for headaches (occ - allergy related). Negative for light-headedness.       Objective:   Vitals:   06/07/23 1412  BP: 132/80  Pulse: (!) 55  SpO2: 98%   BP Readings from Last 3 Encounters:  06/07/23 132/80  12/14/22 128/78  12/09/22 132/81   Wt Readings from Last 3 Encounters:  06/07/23 152 lb 3.2 oz (69 kg)  12/14/22 148 lb 3.2 oz (67.2 kg)  12/09/22 150 lb (68 kg)   Body mass index is 26.96 kg/m.    Physical Exam Constitutional:      General: She is not in acute distress.    Appearance: Normal appearance.  HENT:     Head: Normocephalic and atraumatic.  Eyes:     Conjunctiva/sclera: Conjunctivae normal.  Cardiovascular:     Rate and Rhythm: Normal rate and regular rhythm.  Heart sounds: Normal heart sounds.  Pulmonary:     Effort: Pulmonary effort is normal. No respiratory distress.     Breath sounds: Normal breath sounds. No wheezing.  Musculoskeletal:     Cervical back: Neck supple.     Right lower leg: No edema.     Left lower leg: No edema.  Lymphadenopathy:     Cervical: No cervical adenopathy.  Skin:    General: Skin is warm and dry.     Findings: No rash.  Neurological:     Mental Status: She is alert. Mental status is at baseline.  Psychiatric:        Mood and Affect: Mood normal.        Behavior: Behavior normal.        Lab Results  Component Value Date   WBC 9.4 12/14/2022   HGB 13.5 12/14/2022   HCT 43.0 12/14/2022   PLT 247.0 12/14/2022   GLUCOSE 75 12/14/2022   CHOL 273 (H) 12/14/2022   TRIG 317.0 (H) 12/14/2022    HDL 66.90 12/14/2022   LDLDIRECT 154.0 12/14/2022   LDLCALC 109 (H) 05/31/2022   ALT 14 12/14/2022   AST 17 12/14/2022   NA 136 12/14/2022   K 4.1 12/14/2022   CL 101 12/14/2022   CREATININE 0.78 12/14/2022   BUN 15 12/14/2022   CO2 30 12/14/2022   TSH 0.33 (L) 12/14/2022   INR 1.0 12/08/2021   HGBA1C 5.9 12/14/2022     Assessment & Plan:    See Problem List for Assessment and Plan of chronic medical problems.

## 2023-06-07 ENCOUNTER — Encounter: Payer: Self-pay | Admitting: Internal Medicine

## 2023-06-07 ENCOUNTER — Ambulatory Visit (INDEPENDENT_AMBULATORY_CARE_PROVIDER_SITE_OTHER): Payer: Medicare Other | Admitting: Internal Medicine

## 2023-06-07 VITALS — BP 132/80 | HR 55 | Ht 63.0 in | Wt 152.2 lb

## 2023-06-07 DIAGNOSIS — E7849 Other hyperlipidemia: Secondary | ICD-10-CM

## 2023-06-07 DIAGNOSIS — E0590 Thyrotoxicosis, unspecified without thyrotoxic crisis or storm: Secondary | ICD-10-CM | POA: Diagnosis not present

## 2023-06-07 DIAGNOSIS — I10 Essential (primary) hypertension: Secondary | ICD-10-CM

## 2023-06-07 DIAGNOSIS — G720 Drug-induced myopathy: Secondary | ICD-10-CM

## 2023-06-07 DIAGNOSIS — R7303 Prediabetes: Secondary | ICD-10-CM

## 2023-06-07 DIAGNOSIS — T466X5A Adverse effect of antihyperlipidemic and antiarteriosclerotic drugs, initial encounter: Secondary | ICD-10-CM

## 2023-06-07 LAB — COMPREHENSIVE METABOLIC PANEL
ALT: 16 U/L (ref 0–35)
AST: 20 U/L (ref 0–37)
Albumin: 4.2 g/dL (ref 3.5–5.2)
Alkaline Phosphatase: 77 U/L (ref 39–117)
BUN: 13 mg/dL (ref 6–23)
CO2: 32 meq/L (ref 19–32)
Calcium: 10.6 mg/dL — ABNORMAL HIGH (ref 8.4–10.5)
Chloride: 100 meq/L (ref 96–112)
Creatinine, Ser: 0.83 mg/dL (ref 0.40–1.20)
GFR: 71.37 mL/min (ref >60.00)
Glucose, Bld: 162 mg/dL — ABNORMAL HIGH (ref 70–99)
Potassium: 4.3 meq/L (ref 3.5–5.1)
Sodium: 138 meq/L (ref 135–145)
Total Bilirubin: 0.5 mg/dL (ref 0.2–1.2)
Total Protein: 7.7 g/dL (ref 6.0–8.3)

## 2023-06-07 LAB — T3, FREE: T3, Free: 3.2 pg/mL (ref 2.3–4.2)

## 2023-06-07 LAB — LIPID PANEL
Cholesterol: 273 mg/dL — ABNORMAL HIGH (ref 0–200)
HDL: 69.6 mg/dL (ref >39.00)
LDL Cholesterol: 161 mg/dL — ABNORMAL HIGH (ref 0–99)
NonHDL: 203.36
Total CHOL/HDL Ratio: 4
Triglycerides: 210 mg/dL — ABNORMAL HIGH (ref 0.0–149.0)
VLDL: 42 mg/dL — ABNORMAL HIGH (ref 0.0–40.0)

## 2023-06-07 LAB — T4, FREE: Free T4: 0.97 ng/dL (ref 0.60–1.60)

## 2023-06-07 LAB — HEMOGLOBIN A1C: Hgb A1c MFr Bld: 6.1 % (ref 4.6–6.5)

## 2023-06-07 LAB — TSH: TSH: 0.37 u[IU]/mL (ref 0.35–5.50)

## 2023-06-07 NOTE — Assessment & Plan Note (Signed)
Chronic Has not tolerated several statins or Zetia She is not interested in an injection that she does at home

## 2023-06-07 NOTE — Assessment & Plan Note (Signed)
Chronic Lab Results  Component Value Date   HGBA1C 5.9 12/14/2022   Check a1c Low sugar / carb diet Stressed regular exercise

## 2023-06-07 NOTE — Assessment & Plan Note (Signed)
Last TSH low Asymptomatic Has multinodular goiter-stable by imaging Check TFTs

## 2023-06-07 NOTE — Assessment & Plan Note (Signed)
Chronic Has not tolerated crestor, pravastatin, atorvastatin and zetia Discussed injectable medication, but she is not sure if she could do that Check lipids  Discussed injectable -she deferred

## 2023-06-07 NOTE — Assessment & Plan Note (Signed)
Chronic Blood pressure is well-controlled Cmp, cbc Continue atenolol 25 mg daily, telmisartan 80 mg daily

## 2023-06-13 ENCOUNTER — Telehealth: Payer: Self-pay

## 2023-06-13 ENCOUNTER — Telehealth: Payer: Self-pay | Admitting: Internal Medicine

## 2023-06-13 NOTE — Telephone Encounter (Unsigned)
Copied from CRM [id number]. Topic: Clinical - Lab/Test Results >> Jun 13, 2023 12:51 PM Clayton Bibles wrote: Reason for CRM: Shamyia wants a nurse to call her to review her lab results. Please call [phone]. This is her second call for the results.

## 2023-06-13 NOTE — Telephone Encounter (Signed)
Copied from CRM [id number]. Topic: Clinical - Lab/Test Results >> Jun 10, 2023  4:46 PM Corin V wrote: Reason for CRM: Patient calling to discuss lab results. Please call patient back due to abnormal results. Please call back: [phone]

## 2023-06-14 ENCOUNTER — Encounter: Payer: Self-pay | Admitting: Physician Assistant

## 2023-06-14 ENCOUNTER — Ambulatory Visit: Payer: Medicare Other | Admitting: Physician Assistant

## 2023-06-14 VITALS — BP 115/88 | HR 77 | Resp 18 | Ht 63.0 in | Wt 150.0 lb

## 2023-06-14 DIAGNOSIS — F0670 Mild neurocognitive disorder due to known physiological condition without behavioral disturbance: Secondary | ICD-10-CM | POA: Diagnosis not present

## 2023-06-14 DIAGNOSIS — I999 Unspecified disorder of circulatory system: Secondary | ICD-10-CM | POA: Diagnosis not present

## 2023-06-14 NOTE — Patient Instructions (Signed)
It was a pleasure to see you today at our office.   Recommendations:  Meds: Follow up in 6 months Continue to monitor your Blood pressure, continue aspirin daily  Continue to stay hydrated  Continue to eat healthy Continue brain excercises   RECOMMENDATIONS FOR ALL PATIENTS WITH MEMORY PROBLEMS: 1. Continue to exercise (Recommend 30 minutes of walking everyday, or 3 hours every week) 2. Increase social interactions - continue going to [place] and enjoy social gatherings with friends and family 3. Eat healthy, avoid fried foods and eat more fruits and vegetables 4. Maintain adequate blood pressure, blood sugar, and blood cholesterol level. Reducing the risk of stroke and cardiovascular disease also helps promoting better memory. 5. Avoid stressful situations. Live a simple life and avoid aggravations. Organize your time and prepare for the next day in anticipation. 6. Sleep well, avoid any interruptions of sleep and avoid any distractions in the bedroom that may interfere with adequate sleep quality 7. Avoid sugar, avoid sweets as there is a strong link between excessive sugar intake, diabetes, and cognitive impairment We discussed the Mediterranean diet, which has been shown to help patients reduce the risk of progressive memory disorders and reduces cardiovascular risk. This includes eating fish, eat fruits and green leafy vegetables, nuts like almonds and hazelnuts, walnuts, and also use olive oil. Avoid fast foods and fried foods as much as possible. Avoid sweets and sugar as sugar use has been linked to worsening of memory function.  There is always a concern of gradual progression of memory problems. If this is the case, then we may need to adjust level of care according to patient needs. Support, both to the patient and caregiver, should then be put into place.    FALL PRECAUTIONS: Be cautious when walking. Scan the area for obstacles that may increase the risk of trips and falls. When  getting up in the mornings, sit up at the edge of the bed for a few minutes before getting out of bed. Consider elevating the bed at the head end to avoid drop of blood pressure when getting up. Walk always in a well-lit room (use night lights in the walls). Avoid area rugs or power cords from appliances in the middle of the walkways. Use a walker or a cane if necessary and consider physical therapy for balance exercise. Get your eyesight checked regularly.   HOME SAFETY: Consider the safety of the kitchen when operating appliances like stoves, microwave oven, and blender. Consider having supervision and share cooking responsibilities until no longer able to participate in those. Accidents with firearms and other hazards in the house should be identified and addressed as well       Mediterranean Diet A Mediterranean diet refers to food and lifestyle choices that are based on the traditions of countries located on the [employer]. This way of eating has been shown to help prevent certain conditions and improve outcomes for people who have chronic diseases, like kidney disease and heart disease. What are tips for following this plan? Lifestyle  Cook and eat meals together with your family, when possible. Drink enough fluid to keep your urine clear or pale yellow. Be physically active every day. This includes: Aerobic exercise like running or swimming. Leisure activities like gardening, walking, or housework. Get 7-8 hours of sleep each night. If recommended by your health care provider, drink red wine in moderation. This means 1 glass a day for nonpregnant women and 2 glasses a day for men. A glass of  wine equals 5 oz (150 mL). Reading food labels  Check the serving size of packaged foods. For foods such as rice and pasta, the serving size refers to the amount of cooked product, not dry. Check the total fat in packaged foods. Avoid foods that have saturated fat or trans fats. Check the  ingredients list for added sugars, such as corn syrup. Shopping  At the grocery store, buy most of your food from the areas near the walls of the store. This includes: Fresh fruits and vegetables (produce). Grains, beans, nuts, and seeds. Some of these may be available in unpackaged forms or large amounts (in bulk). Fresh seafood. Poultry and eggs. Low-fat dairy products. Buy whole ingredients instead of prepackaged foods. Buy fresh fruits and vegetables in-season from local farmers markets. Buy frozen fruits and vegetables in resealable bags. If you do not have access to quality fresh seafood, buy precooked frozen shrimp or canned fish, such as tuna, salmon, or sardines. Buy small amounts of raw or cooked vegetables, salads, or olives from the deli or salad bar at your store. Stock your pantry so you always have certain foods on hand, such as olive oil, canned tuna, canned tomatoes, rice, pasta, and beans. Cooking  Cook foods with extra-virgin olive oil instead of using butter or other vegetable oils. Have meat as a side dish, and have vegetables or grains as your main dish. This means having meat in small portions or adding small amounts of meat to foods like pasta or stew. Use beans or vegetables instead of meat in common dishes like chili or lasagna. Experiment with different cooking methods. Try roasting or broiling vegetables instead of steaming or sauteing them. Add frozen vegetables to soups, stews, pasta, or rice. Add nuts or seeds for added healthy fat at each meal. You can add these to yogurt, salads, or vegetable dishes. Marinate fish or vegetables using olive oil, lemon juice, garlic, and fresh herbs. Meal planning  Plan to eat 1 vegetarian meal one day each week. Try to work up to 2 vegetarian meals, if possible. Eat seafood 2 or more times a week. Have healthy snacks readily available, such as: Vegetable sticks with hummus. Greek yogurt. Fruit and nut trail mix. Eat  balanced meals throughout the week. This includes: Fruit: 2-3 servings a day Vegetables: 4-5 servings a day Low-fat dairy: 2 servings a day Fish, poultry, or lean meat: 1 serving a day Beans and legumes: 2 or more servings a week Nuts and seeds: 1-2 servings a day Whole grains: 6-8 servings a day Extra-virgin olive oil: 3-4 servings a day Limit red meat and sweets to only a few servings a month What are my food choices? Mediterranean diet Recommended Grains: Whole-grain pasta. Brown rice. Bulgar wheat. Polenta. Couscous. Whole-wheat bread. Orpah Cobb. Vegetables: Artichokes. Beets. Broccoli. Cabbage. Carrots. Eggplant. Green beans. Chard. Kale. Spinach. Onions. Leeks. Peas. Squash. Tomatoes. Peppers. Radishes. Fruits: Apples. Apricots. Avocado. Berries. Bananas. Cherries. Dates. Figs. Grapes. Lemons. Melon. Oranges. Peaches. Plums. Pomegranate. Meats and other protein foods: Beans. Almonds. Sunflower seeds. Pine nuts. Peanuts. Cod. Salmon. Scallops. Shrimp. Tuna. Tilapia. Clams. Oysters. Eggs. Dairy: Low-fat milk. Cheese. Greek yogurt. Beverages: Water. Red wine. Herbal tea. Fats and oils: Extra virgin olive oil. Avocado oil. Grape seed oil. Sweets and desserts: [place] yogurt with honey. Baked apples. Poached pears. Trail mix. Seasoning and other foods: Basil. Cilantro. Coriander. Cumin. Mint. Parsley. Sage. Rosemary. Tarragon. Garlic. Oregano. Thyme. Pepper. Balsalmic vinegar. Tahini. Hummus. Tomato sauce. Olives. Mushrooms. Limit these Grains: Prepackaged pasta  or rice dishes. Prepackaged cereal with added sugar. Vegetables: Deep fried potatoes (french fries). Fruits: Fruit canned in syrup. Meats and other protein foods: Beef. Pork. Lamb. Poultry with skin. Hot dogs. Tomasa Blase. Dairy: Ice cream. Sour cream. Whole milk. Beverages: Juice. Sugar-sweetened soft drinks. Beer. Liquor and spirits. Fats and oils: Butter. Canola oil. Vegetable oil. Beef fat (tallow). Lard. Sweets and desserts:  Cookies. Cakes. Pies. Candy. Seasoning and other foods: Mayonnaise. Premade sauces and marinades. The items listed may not be a complete list. Talk with your dietitian about what dietary choices are right for you. Summary The Mediterranean diet includes both food and lifestyle choices. Eat a variety of fresh fruits and vegetables, beans, nuts, seeds, and whole grains. Limit the amount of red meat and sweets that you eat. Talk with your health care provider about whether it is safe for you to drink red wine in moderation. This means 1 glass a day for nonpregnant women and 2 glasses a day for men. A glass of wine equals 5 oz (150 mL). This information is not intended to replace advice given to you by your health care provider. Make sure you discuss any questions you have with your health care provider. Document Released: 12/25/2015 Document Revised: 01/27/2016 Document Reviewed: 12/25/2015 Elsevier Interactive Patient Education  2017 [employer].

## 2023-06-14 NOTE — Progress Notes (Signed)
Assessment/Plan:   Mild cognitive impairment, likely of vascular etiology  Karen Evans is a very pleasant 73 y.o. RH female with a history of HTN, HLD, Prediabetes, recent admission for TIA symptoms versus complex migraine 11/2021 without recurrence and with negative neuro workup  presenting today in follow-up for evaluation of memory loss. Patient is nonverbal diabetes or medication.  Not been able to tolerate donepezil in the past (very vivid dreams).  Memory is very stable, today's MMSE is 29/30.  The patient is able to participate in her ADLs without difficulty, continues to drive without any issues.  Mood is good.   Recommendations:   Follow up in 6 months. There is no indication for antidementia medication at this time Recommend good control of cardiovascular risk factors Continue to control mood as per PCP    Subjective:   This patient is here alone.  Previous records as well as any outside records available were reviewed prior to todays visit.   Patient was last seen on 12/09/2022 with MMSE 30/30    Any changes in memory since last visit? "It's alright". .Patient has some difficulty remembering recent conversations and names of people not familiar to her.  She likes to watch game shows.  Likes to remain active doing drawings, painting, attending [employer]. Has been traveling to see her mother who had a PMP .  repeats oneself?  Endorsed Disoriented when walking into a room?  Patient denies   Misplacing objects?  Patient denies   Wandering behavior?   denies   Any personality changes since last visit?   denies   Any worsening depression?: denies.   Hallucinations or paranoia?  Denies.   Seizures?   denies    Any sleep changes? Sleeps well. Denies vivid dreams, REM behavior or sleepwalking   Sleep apnea?   denies   Any hygiene concerns?   denies   Independent of bathing and dressing?  Endorsed  Does the patient needs help with medications? Patient is in charge  Who  is in charge of the finances?  Patient is in charge    Any changes in appetite?  denies    Patient have trouble swallowing?  denies   Does the patient cook?  Any kitchen accidents such as leaving the stove on?   denies   Any headaches?    denies   Vision changes? denies Chronic pain?  denies   Ambulates with difficulty?    denies   Recent falls or head injuries?    denies      Unilateral weakness, numbness or tingling?   denies   Any tremors?  denies   Any anosmia?    denies   Any incontinence of urine?  denies   Any bowel dysfunction?  denies      Patient lives alone   Does the patient drive? Yes, denies getting lost    History on Initial Assessment 06/07/2018: This is a 73 year old right-handed woman with a history of hypertension, hyperlipidemia, presenting for evaluation of memory concerns. She reports that she started noticing memory changes "when they started letting people go at the plant in 2016." She noticed she was forgetting things. She reported symptoms to Dr. Lawerance Bach in November 2019 where she would leave food out without putting it away, one time she left ice cream container out after eating it, and left a doggie bag or groceries in her car. She has burnt food she forgot was cooking and now has to make herself stay  in the kitchen. She lost her keys one time and has started putting them in one place. She denies getting lost driving. No missed bills or medications. 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 She lives alone, her family (mother and brother) live in [place]. She has told them of her memory concerns, "they don't think I need to come here." Her maternal aunt had memory issues. No history of concussions or alcohol use.   She has occasional right hand and left finger tingling. She denies any headaches, dizziness, diplopia, dysarthria/dysphagia, neck/back pain, bowel/bladder dysfunction, anosmia, or  tremors. No falls. Sleep is good. Mood "I think it's good." There is no family to corroborate history but there does not appear to be any paranoia or hallucinations.    Diagnostic Data: MRI brain without contrast done 07/2018 which did not show any acute changes. There was moderately advanced chronic microvascular disease.     Past Medical History:  Diagnosis Date   Allergy    Anemia    PMH of    Colon polyp    Crohn's    ileitis/mild; Dr [name]   Duodenitis without mention of hemorrhage 2007   EGD   Fibroid, uterine    Dr Cherly Hensen; G 0 P 0   GERD (gastroesophageal reflux disease)    Goiter    CTS,RUE > LUE   Heart murmur    Hiatal hernia 2002   EGD   Hypertension    Internal hemorrhoids    Migraines    when takes vitamins    Rectal polyp 01/24/2012   TUBULAR ADENOMA   Reflux esophagitis 2007   EGD   Stricture and stenosis of esophagus 2007   EGD   Tubular adenoma of colon    Ulcer    Vitamin D deficiency      Past Surgical History:  Procedure Laterality Date   CATARACT EXTRACTION Bilateral    2024   COLONOSCOPY     polyps;granular ileitis 2006;2007 normal, Dr [name]   COLONOSCOPY WITH PROPOFOL  07/2016   Dr.Pyrtle   HEMORRHOID SURGERY     OPERATIVE HYSTEROSCOPY  08/05/2004   Dr Cherly Hensen   OVARIAN CYST REMOVAL     POLYPECTOMY     UPPER GASTROINTESTINAL ENDOSCOPY       PREVIOUS MEDICATIONS:   CURRENT MEDICATIONS:  Outpatient Encounter Medications as of 06/14/2023  Medication Sig   acetaminophen (TYLENOL) 500 MG tablet Take 250-500 mg by mouth every 6 (six) hours as needed for headache (pain).   aspirin EC 81 MG tablet Take 1 tablet (81 mg total) by mouth daily. Swallow whole.   atenolol (TENORMIN) 25 MG tablet TAKE 1 TABLET BY MOUTH DAILY   betamethasone valerate ointment (VALISONE) 0.1 % Apply a pea sized amount BID for up to 2 weeks as needed (Patient taking differently: Apply 1 Application topically 2 (two) times daily as needed (irritation).)    Calcium Carbonate-Vitamin D (CALTRATE 600+D PO) Take 1 tablet by mouth daily.    cholecalciferol (VITAMIN D) 1000 units tablet Take 1,000 Units by mouth daily.   fexofenadine (ALLEGRA) 180 MG tablet Take 180 mg by mouth daily.   Fluticasone Propionate (FLONASE NA) Place 1 spray into the nose daily as needed (congestion).   hydrocortisone (ANUSOL-HC) 25 MG suppository Place 25 mg rectally as needed for hemorrhoids or anal itching.   ketorolac (ACULAR) 0.5 % ophthalmic solution Place 1 drop into the left eye 4 (  four) times daily.   Lactase (LACTAID PO) Take 1 tablet by mouth 2 (two) times daily as needed (stomach upset/ gas).    Multiple Vitamin (MULTIVITAMIN WITH MINERALS) TABS tablet Take 1 tablet by mouth daily.   Propylene Glycol (SYSTANE COMPLETE OP) Place 1 drop into both eyes at bedtime.   telmisartan (MICARDIS) 80 MG tablet TAKE 1 TABLET BY MOUTH DAILY   No facility-administered encounter medications on file as of 06/14/2023.     Objective:     PHYSICAL EXAMINATION:    VITALS:   Vitals:   06/14/23 1422  BP: 115/88  Pulse: 77  Resp: 18  SpO2: 96%  Weight: 150 lb (68 kg)  Height: 5\' 3"  (1.6 m)    GEN:  The patient appears stated age and is in NAD. HEENT:  Normocephalic, atraumatic.   Neurological examination:  General: NAD, well-groomed, appears stated age. Orientation: The patient is alert. Oriented to person, place and date Cranial nerves: There is good facial symmetry.The speech is fluent and clear. No aphasia or dysarthria. Fund of knowledge is appropriate. Recent memory impaired and remote memory is normal.  Attention and concentration are normal.  Able to name objects and repeat phrases.  Hearing is intact to conversational tone ***.   Delayed recall *** Sensation: Sensation is intact to light touch throughout Motor: Strength is at least antigravity x4. DTR's 2/4 in UE/LE      08/13/2019    2:00 PM 12/20/2018    3:00 PM 06/07/2018    9:00 AM  Montreal Cognitive  Assessment   Visuospatial/ Executive (0/5) 2 4 4   Naming (0/3) 3 3 3   Attention: Read list of digits (0/2) 0 1 2  Attention: Read list of letters (0/1) 0 1 1  Attention: Serial 7 subtraction starting at 100 (0/3) 0 1 0  Language: Repeat phrase (0/2) 0 2 2  Language : Fluency (0/1) 0 0 0  Abstraction (0/2) 1 2 0  Delayed Recall (0/5) 4 0 3  Orientation (0/6) 6 6 6   Total 16 20 21   Adjusted Score (based on education) 16         06/14/2023    7:00 PM 12/09/2022    4:00 PM 06/10/2022    2:00 PM  MMSE - Mini Mental State Exam  Orientation to time 4 5 5   Orientation to Place 5 5 5   Registration 3 3 3   Attention/ Calculation 5 5 4   Recall 3 3 3   Language- name 2 objects 2 2 2   Language- repeat 1 1 1   Language- follow 3 step command 3 3 3   Language- read & follow direction 1 1 1   Write a sentence 1 1 1   Copy design 1 1 1   Total score 29 30 29        Movement examination: Tone: There is normal tone in the UE/LE Abnormal movements:  no tremor.  No myoclonus.  No asterixis.   Coordination:  There is no decremation with RAM's. Normal finger to nose  Gait and Station: The patient has no difficulty arising out of a deep-seated chair without the use of the hands. The patient's stride length is good.  Gait is cautious and narrow.   Thank you for allowing [place] the opportunity to participate in the care of this nice patient. Please do not hesitate to contact us for any questions or concerns.   Total time spent on today's visit was *** minutes dedicated to this patient today, preparing to see patient, examining the patient,  ordering tests and/or medications and counseling the patient, documenting clinical information in the EHR or other health record, independently interpreting results and communicating results to the patient/family, discussing treatment and goals, answering patient's questions and coordinating care.  Cc:  Pincus Sanes, MD  Marlowe Kays 06/14/2023 7:56 PM

## 2023-06-14 NOTE — Telephone Encounter (Signed)
Attempted to reach patient today with results.  Home number listed in chart is non working. Phone number left in message has no VM and just rings.  Left message on cellphone number in chart.  Please provide results to her if she calls back.

## 2023-06-14 NOTE — Telephone Encounter (Signed)
Here are her blood work results   Your liver tests and kidney function are normal.  Your cholesterol is higher than ideal and as we discussed at your visit you should ideally be on a cholesterol medication.  We could have you see a cholesterol specialist to discuss other options.  Your thyroid function is normal.  Your sugars are stable in the prediabetic range.

## 2023-06-15 ENCOUNTER — Telehealth: Payer: Self-pay

## 2023-06-15 NOTE — Telephone Encounter (Signed)
Copied from CRM [id number]. Topic: Clinical - Lab/Test Results >> Jun 15, 2023  2:16 PM Fredrich Romans wrote: Reason for CRM: patient called in to ask about lab results from the labs that were drawn on 06/07/2023.

## 2023-06-16 NOTE — Telephone Encounter (Signed)
Attempted to reach patient but first number in chart is a non working number.  Called number listed as mobile and left message for her to return call to clinic.  If she calls back please see encounter from 06/13/23 with her results.  If not she needs to provide a good number where she can be reached at.

## 2023-06-17 NOTE — Telephone Encounter (Signed)
Copied from CRM [id number]. Topic: Clinical - Lab/Test Results >> Jun 17, 2023  3:13 PM Suzette B wrote: Reason for CRM: Patient called stating she wanted her results I was about to read out her results to her and let her know the office had been trying to get in touch with her; however, her phone lines have been down in the home. Patient has provided the office with a mobile number its [phone] (mobile) patient states she is wanting to work with the doctor to get some help with her cholesterol

## 2023-06-20 ENCOUNTER — Ambulatory Visit: Payer: Medicare Other | Admitting: Podiatry

## 2023-06-20 ENCOUNTER — Encounter: Payer: Self-pay | Admitting: Podiatry

## 2023-06-20 DIAGNOSIS — M79675 Pain in left toe(s): Secondary | ICD-10-CM

## 2023-06-20 DIAGNOSIS — B351 Tinea unguium: Secondary | ICD-10-CM

## 2023-06-20 DIAGNOSIS — M79674 Pain in right toe(s): Secondary | ICD-10-CM

## 2023-06-20 NOTE — Progress Notes (Signed)
Subjective:   Patient ID: Karen Evans, female   DOB: 73 y.o.   MRN: [id number]   HPI Patient presents with elongated nailbeds 1-5 both feet that are thickened and get become incurvated in the corners   ROS      Objective:  Physical Exam  Neurovascular status intact with thick mycotic nail infection nailbeds 1-5 both feet with thickness noted and discomfort upon pressure     Assessment:  Chronic mycotic nail infection 1-5 both feet painful     Plan:  Debridement of nailbeds 1-5 both feet no iatrogenic bleeding reappoint routine care

## 2023-06-21 NOTE — Telephone Encounter (Signed)
 Second attempt to reach patient on number provided.   Cell number provided is a non working number.  Please provide results to patient if she calls back.  Here are her blood work results     Your liver tests and kidney function are normal.  Your cholesterol is higher than ideal and as we discussed at your visit you should ideally be on a cholesterol medication.  We could have you see a cholesterol specialist to discuss other options.  Your thyroid  function is normal.  Your sugars are stable in the prediabetic range.

## 2023-06-22 ENCOUNTER — Telehealth: Payer: Self-pay | Admitting: Internal Medicine

## 2023-06-22 DIAGNOSIS — E7849 Other hyperlipidemia: Secondary | ICD-10-CM

## 2023-06-22 DIAGNOSIS — G459 Transient cerebral ischemic attack, unspecified: Secondary | ICD-10-CM

## 2023-06-22 NOTE — Telephone Encounter (Signed)
 Copied from CRM [id number]. Topic: Referral - Question >> Jun 21, 2023  4:53 PM Merlynn A wrote: Reason for CRM: Patient called in to see if provider has put in a referral for a cholesterol specialist. Patient understood lab results and would like to see a referral specialist. Patient can be reached at [phone]. Patient is having issues with home phone at this time.

## 2023-06-22 NOTE — Telephone Encounter (Signed)
 Referral ordered

## 2023-07-27 ENCOUNTER — Ambulatory Visit (HOSPITAL_BASED_OUTPATIENT_CLINIC_OR_DEPARTMENT_OTHER): Payer: Medicare Other | Admitting: Internal Medicine

## 2023-07-27 ENCOUNTER — Telehealth: Payer: Self-pay | Admitting: Internal Medicine

## 2023-07-27 VITALS — BP 148/82 | HR 55 | Ht 63.0 in | Wt 150.5 lb

## 2023-07-27 DIAGNOSIS — Z888 Allergy status to other drugs, medicaments and biological substances status: Secondary | ICD-10-CM

## 2023-07-27 DIAGNOSIS — I6523 Occlusion and stenosis of bilateral carotid arteries: Secondary | ICD-10-CM

## 2023-07-27 DIAGNOSIS — E785 Hyperlipidemia, unspecified: Secondary | ICD-10-CM

## 2023-07-27 DIAGNOSIS — G459 Transient cerebral ischemic attack, unspecified: Secondary | ICD-10-CM | POA: Diagnosis not present

## 2023-07-27 NOTE — Progress Notes (Signed)
 LIPID CLINIC CONSULT NOTE  Chief Complaint:  Manage dyslipidemia  Primary Care Physician: Karen Sanes, MD  Primary Cardiologist:  None  HPI:  Karen Evans is a 73 y.o. female who is being seen today for the evaluation of dyslipidemia at the request of Karen Evans, Karen Mo, MD. This is a very pleasant 73 year old female kindly referred for evaluation management of dyslipidemia.  She has had a longstanding history of elevated cholesterol but unfortunately has been intolerant to statins having had allergic symptoms, primarily hives.  Despite having hives with both atorvastatin, rosuvastatin and pravastatin, she has been able to tolerate low-dose rosuvastatin 5 mg 3 times a week.  She also had an tolerance to ezetimibe which cause drowsiness and probably a rash.  In general her side effect with medicine she says is rash.  She does carry diagnosis of a vascular dementia which she downplayed.  She also has prediabetes and essential hypertension.  No family history of early onset heart disease and her mother still alive at 27.  More recently her lipids have come down with total cholesterol 232, HDL 62, triglycerides 202 and LDL 139.  07/27/2023  Karen Evans is seen today for follow-up.  In 2023 she had a possible TIA.  She had carotid Dopplers which showed bilateral carotid artery disease.  She also has a diagnosis of vascular dementia.  Unfortunately she could not tolerate rosuvastatin anymore and she was taking that previously only 3 times a week.  She is also previously been intact tolerant of statins.  She has also tried ezetimibe which caused side effects.  Recent lipid showed total cholesterol 273, triglycerides 210, HDL 69 and LDL 161.  Her target LDL should be less than 70.  PMHx:  Past Medical History:  Diagnosis Date   Allergy    Anemia    PMH of    Colon polyp    Crohn's    ileitis/mild; Dr [name]   Duodenitis without mention of hemorrhage 2007   EGD   Fibroid, uterine     Dr Cherly Hensen; G 0 P 0   GERD (gastroesophageal reflux disease)    Goiter    CTS,RUE > LUE   Heart murmur    Hiatal hernia 2002   EGD   Hypertension    Internal hemorrhoids    Migraines    when takes vitamins    Rectal polyp 01/24/2012   TUBULAR ADENOMA   Reflux esophagitis 2007   EGD   Stricture and stenosis of esophagus 2007   EGD   Tubular adenoma of colon    Ulcer    Vitamin D deficiency     Past Surgical History:  Procedure Laterality Date   CATARACT EXTRACTION Bilateral    2024   COLONOSCOPY     polyps;granular ileitis 2006;2007 normal, Dr [name]   COLONOSCOPY WITH PROPOFOL  07/2016   Dr.Pyrtle   HEMORRHOID SURGERY     OPERATIVE HYSTEROSCOPY  08/05/2004   Dr Cherly Hensen   OVARIAN CYST REMOVAL     POLYPECTOMY     UPPER GASTROINTESTINAL ENDOSCOPY      FAMHx:  Family History  Problem Relation Age of Onset   Diabetes Mother    Hypertension Mother    Heart disease Mother    Lymphoma Father    Diabetes Brother        CAD; Dialysis   Hypertension Brother    Colon cancer Maternal Grandmother    Rectal cancer Paternal Grandmother    Penile cancer Paternal Grandfather  Heart attack Other        1/2 maternal aunts X 2   Cancer Other    COPD Neg Hx    Esophageal cancer Neg Hx    Stomach cancer Neg Hx    Stroke Neg Hx    Colon polyps Neg Hx     SOCHx:   reports that she has never smoked. She has never used smokeless tobacco. She reports that she does not drink alcohol and does not use drugs.  ALLERGIES:  Allergies  Allergen Reactions   Cefprozil Hives   Erythromycin Hives   Esomeprazole Magnesium Hives   Penicillins Hives, Rash and Other (See Comments)    Has patient had a PCN reaction causing immediate rash, facial/tongue/throat swelling, SOB or lightheadedness with hypotension: Yes  Has patient had a PCN reaction causing severe rash involving mucus membranes or skin necrosis: No  Has patient had a PCN reaction that required hospitalization:  No  Has patient had a PCN reaction occurring within the last 10 years: No  If all of the above answers are "NO", then may proceed with Cephalosporin use.   Sulfonamide Derivatives Hives   Amlodipine Other (See Comments)    gerd   Crestor [Rosuvastatin Calcium] Itching   Crestor [Rosuvastatin] Itching and Other (See Comments)    Lightheadedness, prickly all over   Erythromycin Base Hives   Hydrochlorothiazide Other (See Comments)    lightheadedness   Other Other (See Comments)   Pravastatin Other (See Comments)    Dizzy, muscle soreness   Sulfa Antibiotics Hives and Other (See Comments)   Atorvastatin Other (See Comments)    Eyes itch   Doxycycline Nausea And Vomiting   Zetia [Ezetimibe] Rash    Caused drowsiness    ROS: Pertinent items noted in HPI and remainder of comprehensive ROS otherwise negative.  HOME MEDS: Current Outpatient Medications on File Prior to Visit  Medication Sig Dispense Refill   acetaminophen (TYLENOL) 500 MG tablet Take 250-500 mg by mouth every 6 (six) hours as needed for headache (pain).     aspirin EC 81 MG tablet Take 1 tablet (81 mg total) by mouth daily. Swallow whole. 30 tablet 12   atenolol (TENORMIN) 25 MG tablet TAKE 1 TABLET BY MOUTH DAILY 90 tablet 3   betamethasone valerate ointment (VALISONE) 0.1 % Apply a pea sized amount BID for up to 2 weeks as needed (Patient taking differently: Apply 1 Application topically 2 (two) times daily as needed (irritation).) 30 g 0   Calcium Carbonate-Vitamin D (CALTRATE 600+D PO) Take 1 tablet by mouth daily.      cholecalciferol (VITAMIN D) 1000 units tablet Take 1,000 Units by mouth daily.     fexofenadine (ALLEGRA) 180 MG tablet Take 180 mg by mouth daily.     Fluticasone Propionate (FLONASE NA) Place 1 spray into the nose daily as needed (congestion).     hydrocortisone (ANUSOL-HC) 25 MG suppository Place 25 mg rectally as needed for hemorrhoids or anal itching.     ketorolac (ACULAR) 0.5 % ophthalmic  solution Place 1 drop into the left eye 4 (four) times daily.     Lactase (LACTAID PO) Take 1 tablet by mouth 2 (two) times daily as needed (stomach upset/ gas).      Multiple Vitamin (MULTIVITAMIN WITH MINERALS) TABS tablet Take 1 tablet by mouth daily.     Propylene Glycol (SYSTANE COMPLETE OP) Place 1 drop into both eyes at bedtime.     telmisartan (MICARDIS) 80 MG tablet TAKE 1  TABLET BY MOUTH DAILY 90 tablet 3   No current facility-administered medications on file prior to visit.    LABS/IMAGING: No results found for this or any previous visit (from the past 48 hours). No results found.  LIPID PANEL:    Component Value Date/Time   CHOL 273 (H) 06/07/2023 1446   CHOL 187 06/02/2021 1306   TRIG 210.0 (H) 06/07/2023 1446   HDL 69.60 06/07/2023 1446   HDL 77 06/02/2021 1306   CHOLHDL 4 06/07/2023 1446   VLDL 42.0 (H) 06/07/2023 1446   LDLCALC 161 (H) 06/07/2023 1446   LDLCALC 91 06/02/2021 1306   LDLCALC 165 (H) 02/08/2020 1343   LDLDIRECT 154.0 12/14/2022 1555    WEIGHTS: Wt Readings from Last 3 Encounters:  07/27/23 150 lb 8 oz (68.3 kg)  06/14/23 150 lb (68 kg)  06/07/23 152 lb 3.2 oz (69 kg)    VITALS: BP (!) 148/82   Pulse (!) 55   Ht 5\' 3"  (1.6 m)   Wt 150 lb 8 oz (68.3 kg)   SpO2 98%   BMI 26.66 kg/m   EXAM: General appearance: alert and no distress Neck: no carotid bruit, no JVD, and thyroid not enlarged, symmetric, no tenderness/mass/nodules Lungs: clear to auscultation bilaterally Heart: regular rate and rhythm, S1, S2 normal, no murmur, click, rub or gallop Abdomen: soft, non-tender; bowel sounds normal; no masses,  no organomegaly Extremities: extremities normal, atraumatic, no cyanosis or edema Pulses: 2+ and symmetric Skin: Skin color, texture, turgor normal. No rashes or lesions Neurologic: Grossly normal Psych: Pleasant  EKG: Deferred  ASSESSMENT: Mixed dyslipidemia, goal LDL less than 70 Statin intolerant-hives/allergy Ezetimibe  intolerant Hypertension Prediabetes Vascular dementia History of TIA Bilateral carotid atherosclerosis  PLAN: 1.   Ms. Hurlbutt has a mixed dyslipidemia with a goal LDL less than 70.  Unfortunately she is intolerant of statins and ezetimibe.  She has a vascular dementia and history of TIA with carotid Dopplers that showed bilateral carotid arthrosclerosis in 2023.  She needs aggressive lipid-lowering.  I think she is a good candidate to add Leqvio.  This would help with patient compliance is well since she will be administered the medication every 6 months.  Will reach out for prior authorization as the medicine can be somewhat expensive but she might be able to get patient assistance for it.  If approved start on therapy which would be an initial shot followed by 30 days and then every 6 months thereafter.  Labs in 6 months and I will contact her with those results.  Chrystie Nose, MD, [facility], FACP  [place]  [facility]  Medical Director of the [facility]  [facility] Diplomate of the American Board of Clinical Lipidology Attending Cardiologist  Direct Dial: [phone]  Fax: [phone]  Website:  [email or web address] 07/27/2023, 2:44 PM

## 2023-07-27 NOTE — Telephone Encounter (Signed)
 Karen Evans,  I have submitted the auth and it's pending. I will f/u once I have a response from the insurance.  [name]

## 2023-07-27 NOTE — Patient Instructions (Signed)
 Medication Instructions:  Dr. Rennis Golden has recommended an injectable medication called LEQVIO. This is administered by a health care provider. The frequency of injections is TWO injections given 3 months apart (loading dose) and then every 6 months after that. The injection appointments at [facility] ([address] [place], [place]  [address]). Once we have the benefits check information, we will reach out to let you know if the medication is covered 100%, if there is a deductible, co-insurance, out-of-pocket max. From there, we will see if you need patient assistance and our team will take care of working on this. Because of the frequency schedule of this medication, your follow up/repeat cholesterol lab work will be about 5-6 months from now.   *If you need a refill on your cardiac medications before your next appointment, please call your pharmacy*   Lab Work: FASTING lab work to check cholesterol in 5-6 months You can use any [facility] location    Follow-Up: At [facility], you and your health needs are our priority.  As part of our continuing mission to provide you with exceptional heart care, we have created designated Provider Care Teams.  These Care Teams include your primary Cardiologist (physician) and Advanced Practice Providers (APPs -  Physician Assistants and Nurse Practitioners) who all work together to provide you with the care you need, when you need it.  We recommend signing up for the patient portal called "MyChart".  Sign up information is provided on this After Visit Summary.  MyChart is used to connect with patients for Virtual Visits (Telemedicine).  Patients are able to view lab/test results, encounter notes, upcoming appointments, etc.  Non-urgent messages can be sent to your provider as well.   To learn more about what you can do with MyChart, go to [email or web address].    Your next appointment:   12 months with Dr. Rennis Golden Other  Instructions

## 2023-07-27 NOTE — Telephone Encounter (Signed)
 Patient seen in office 07/27/23 Leqvio determined to be optimal-lipid lowering therapy.   Faxed start form to: [phone]  Infusion therapy order set placed  Message routed to infusion center team

## 2023-07-28 ENCOUNTER — Telehealth: Payer: Self-pay | Admitting: Pharmacy Technician

## 2023-07-28 NOTE — Telephone Encounter (Signed)
 Karen Evans has been denied due to patient has not failed or tried preferred medication.  Preferred medication is Praluent or Repatha. We will d/c the treatment plan.  Auth Submission: DENIED Site of care: Site of care: [facility] Payer: UHC Medication & CPT/J Code(s) submitted: Leqvio (Inclisiran) [phone] Route of submission (phone, fax, portal): PORTAL Phone # Fax # Auth type: Buy/Bill PB Units/visits requested:  Reference number: [id number] Approval from:  to

## 2023-07-28 NOTE — Addendum Note (Signed)
 Addended by: Ihor Austin D on: 07/28/2023 01:21 PM   Modules accepted: Orders

## 2023-07-29 ENCOUNTER — Other Ambulatory Visit (HOSPITAL_COMMUNITY): Payer: Self-pay

## 2023-07-29 ENCOUNTER — Telehealth: Payer: Self-pay | Admitting: Pharmacy Technician

## 2023-07-29 NOTE — Telephone Encounter (Signed)
 Pharmacy Patient Advocate Encounter   Received notification from Physician's Office that prior authorization for Repatha is required/requested.   Insurance verification completed.   The patient is insured through [place] .   Per test claim: PA required; PA submitted to above mentioned insurance via CoverMyMeds Key/confirmation #/EOC [id number] Status is pending

## 2023-07-29 NOTE — Telephone Encounter (Signed)
 Pharmacy Patient Advocate Encounter  Received notification from [facility] that Prior Authorization for Repatha has been APPROVED from 07/29/23 to 01/29/24. Ran test claim, Copay is $302.00- one month (DEDUCTIBLE). This test claim was processed through [facility]- copay amounts may vary at other pharmacies due to pharmacy/plan contracts, or as the patient moves through the different stages of their insurance plan.   PA #/Case ID/Reference #: [id number]

## 2023-07-29 NOTE — Telephone Encounter (Signed)
 Spoke with patient about Leqvio denial and need to try Repatha first  She stated that someone told her a Repatha prescription was going to come from [facility]. Explained that NO prescription has been sent to pharmacy at this time, per chart review.   She will need assistance with first injection.   Advised will be in touch about Repatha approval determination and then can arrange appointment with clinical pharmacist for injection appointment. She was advised that she will NOT come to the office for subsequent injections, that this is self-administered.

## 2023-07-29 NOTE — Telephone Encounter (Signed)
 Chrystie Nose, MD  You; Amalia Greenhouse, [id number] hours ago (3:48 PM)   Ok .Marland Kitchen Then would pursue Repatha 140 mg q2 weeks.  Dr. Rennis Golden

## 2023-07-29 NOTE — Telephone Encounter (Signed)
 Leqvio denied. Documented in another encounter.

## 2023-08-01 DIAGNOSIS — H40023 Open angle with borderline findings, high risk, bilateral: Secondary | ICD-10-CM | POA: Diagnosis not present

## 2023-08-01 DIAGNOSIS — H524 Presbyopia: Secondary | ICD-10-CM | POA: Diagnosis not present

## 2023-08-01 DIAGNOSIS — H5213 Myopia, bilateral: Secondary | ICD-10-CM | POA: Diagnosis not present

## 2023-08-01 NOTE — Telephone Encounter (Signed)
 Per Phillips Hay, PharmD, patient could be worked into schedule for first injection/teaching on 3/20 @ 9am or 4pm OR 3/25 (same times)  Will await response from PA Rx Team re: [employer]

## 2023-08-02 ENCOUNTER — Other Ambulatory Visit (HOSPITAL_COMMUNITY): Payer: Self-pay

## 2023-08-02 ENCOUNTER — Telehealth: Payer: Self-pay | Admitting: Pharmacy Technician

## 2023-08-02 MED ORDER — REPATHA SURECLICK 140 MG/ML ~~LOC~~ SOAJ: 140.0000 mg | SUBCUTANEOUS | 11 refills | Status: DC

## 2023-08-02 NOTE — Addendum Note (Signed)
 Addended by: Lindell Spar on: 08/02/2023 05:02 PM   Modules accepted: Orders

## 2023-08-02 NOTE — Telephone Encounter (Signed)
 Patient approved for [employer].  Rx sent to [facility] pharmacy with [facility] info.   Spent 20+ minutes on the phone reviewing appointment with clinical pharmacist for 1st injection, when repeat lab work is needed (mid-June) and that slips will be mailed. Advised that she present [facility] in [facility] on 3/25 @4pm  for Repatha demo/teaching. She inquired about length of therapy amongst other questions. Answered all questions to best of ability.

## 2023-08-02 NOTE — Telephone Encounter (Signed)
 Patient Advocate Encounter   The patient was approved for a [employer] grant that will help cover the cost of REPATHA Total amount awarded, 2500.00.  Effective: 07/03/23 - 07/02/24   VHQ:[id number] BMW:[id number] NUUVO:[id number] [id number] [employer] ID: [id number]   I will call the patient to give her information and see what pharmacy she wants

## 2023-08-08 ENCOUNTER — Telehealth: Payer: Self-pay | Admitting: Internal Medicine

## 2023-08-08 NOTE — Telephone Encounter (Signed)
 Spoke with patient. She has not gotten Repatha Rx yet. Advised Rx was sent on 08/02/23.   Advised Repatha teaching scheduled for 08/09/23 @ 4pm.   Called [facility] pharmacy and med is ready for pickup - $0 copay  Called patient again. Advised [facility] has med ready. She needs to pick up between now and PharmD visit tomorrow. Advised again of office location.   No further assistance needed at this time.

## 2023-08-08 NOTE — Telephone Encounter (Signed)
 Hey y'all from what I can see no plan made for this patient to come in for repatha teaching, is this needed?

## 2023-08-08 NOTE — Telephone Encounter (Signed)
 Please call pt to discuss repatha, seems to be confused about if she is coming into office to be shown how to give herself repatha injection, still has not med

## 2023-08-09 ENCOUNTER — Ambulatory Visit
Attending: Pharmacist Clinician (PhC)/ Clinical Pharmacy Specialist | Admitting: Pharmacist Clinician (PhC)/ Clinical Pharmacy Specialist

## 2023-08-10 DIAGNOSIS — G459 Transient cerebral ischemic attack, unspecified: Secondary | ICD-10-CM | POA: Diagnosis not present

## 2023-08-10 DIAGNOSIS — E7849 Other hyperlipidemia: Secondary | ICD-10-CM | POA: Diagnosis not present

## 2023-08-11 LAB — NMR, LIPOPROFILE
Cholesterol, Total: 268 mg/dL — ABNORMAL HIGH (ref 100–199)
HDL Particle Number: 37.3 umol/L (ref >=30.5)
HDL-C: 83 mg/dL (ref >39)
LDL Particle Number: 1613 nmol/L — ABNORMAL HIGH (ref <1000)
LDL Size: 20.8 nm (ref >20.5)
LDL-C (NIH Calc): 167 mg/dL — ABNORMAL HIGH (ref 0–99)
LP-IR Score: 26 (ref <=45)
Small LDL Particle Number: 502 nmol/L (ref <=527)
Triglycerides: 106 mg/dL (ref 0–149)

## 2023-08-11 LAB — LIPOPROTEIN A (LPA): Lipoprotein (a): 97.5 nmol/L — ABNORMAL HIGH (ref <75.0)

## 2023-08-15 ENCOUNTER — Other Ambulatory Visit (HOSPITAL_BASED_OUTPATIENT_CLINIC_OR_DEPARTMENT_OTHER): Payer: Self-pay | Admitting: *Deleted

## 2023-08-15 DIAGNOSIS — G459 Transient cerebral ischemic attack, unspecified: Secondary | ICD-10-CM

## 2023-08-15 DIAGNOSIS — E785 Hyperlipidemia, unspecified: Secondary | ICD-10-CM

## 2023-08-23 ENCOUNTER — Ambulatory Visit (INDEPENDENT_AMBULATORY_CARE_PROVIDER_SITE_OTHER): Payer: Medicare Other

## 2023-08-23 VITALS — BP 132/70 | HR 68 | Ht 64.0 in | Wt 147.0 lb

## 2023-08-23 DIAGNOSIS — Z0000 Encounter for general adult medical examination without abnormal findings: Secondary | ICD-10-CM

## 2023-08-23 NOTE — Progress Notes (Signed)
 Subjective:   Karen Evans is a 72 y.o. who presents for a Medicare Wellness preventive visit.  Visit Complete: In person  Persons Participating in Visit: Patient.  AWV Questionnaire: No: Patient Medicare AWV questionnaire was not completed prior to this visit.  Cardiac Risk Factors include: advanced age (>18men, >31 women);hypertension;dyslipidemia     Objective:    Today's Vitals   08/23/23 1413  BP: 132/70  Pulse: 68  SpO2: 97%  Weight: 147 lb (66.7 kg)  Height: 5\' 4"  (1.626 m)   Body mass index is 25.23 kg/m.     08/23/2023    2:11 PM 06/14/2023    2:28 PM 12/09/2022    2:31 PM 08/17/2022   11:38 AM 06/10/2022    1:58 PM 12/08/2021   11:00 PM 08/31/2021    3:42 PM  Advanced Directives  Does Patient Have a Medical Advance Directive? No Yes No Yes Yes No Yes  Type of Air cabin crew of [place];Living will     Does patient want to make changes to medical advance directive?  No - Patient declined       Copy of Healthcare Power of Attorney in Chart?  No - copy requested  No - copy requested     Would patient like information on creating a medical advance directive? Yes (MAU/Ambulatory/Procedural Areas - Information given)  No - Patient declined   No - Patient declined     Current Medications (verified) Outpatient Encounter Medications as of 08/23/2023  Medication Sig   acetaminophen (TYLENOL) 500 MG tablet Take 250-500 mg by mouth every 6 (six) hours as needed for headache (pain).   aspirin EC 81 MG tablet Take 1 tablet (81 mg total) by mouth daily. Swallow whole.   atenolol (TENORMIN) 25 MG tablet TAKE 1 TABLET BY MOUTH DAILY   betamethasone valerate ointment (VALISONE) 0.1 % Apply a pea sized amount BID for up to 2 weeks as needed (Patient taking differently: Apply 1 Application topically 2 (two) times daily as needed (irritation).)   Calcium Carbonate-Vitamin D (CALTRATE 600+D PO) Take 1 tablet by mouth daily.     cholecalciferol (VITAMIN D) 1000 units tablet Take 1,000 Units by mouth daily.   Evolocumab (REPATHA SURECLICK) 140 MG/ML SOAJ Inject 140 mg into the skin every 14 (fourteen) days.   fexofenadine (ALLEGRA) 180 MG tablet Take 180 mg by mouth daily.   Fluticasone Propionate (FLONASE NA) Place 1 spray into the nose daily as needed (congestion).   hydrocortisone (ANUSOL-HC) 25 MG suppository Place 25 mg rectally as needed for hemorrhoids or anal itching.   ketorolac (ACULAR) 0.5 % ophthalmic solution Place 1 drop into the left eye 4 (four) times daily.   Lactase (LACTAID PO) Take 1 tablet by mouth 2 (two) times daily as needed (stomach upset/ gas).    Multiple Vitamin (MULTIVITAMIN WITH MINERALS) TABS tablet Take 1 tablet by mouth daily.   olopatadine (PATANOL) 0.1 % ophthalmic solution Place 1 drop into both eyes 1 day or 1 dose. Dr. Joseph Art   Propylene Glycol (SYSTANE COMPLETE OP) Place 1 drop into both eyes at bedtime.   telmisartan (MICARDIS) 80 MG tablet TAKE 1 TABLET BY MOUTH DAILY   No facility-administered encounter medications on file as of 08/23/2023.    Allergies (verified) Cefprozil, Erythromycin, Esomeprazole magnesium, Penicillins, Sulfonamide derivatives, Amlodipine, Crestor [rosuvastatin calcium], Crestor [rosuvastatin], Erythromycin base, Hydrochlorothiazide, Other, Pravastatin, Sulfa antibiotics, Atorvastatin, Doxycycline, and Zetia [ezetimibe]   History: Past Medical History:  Diagnosis  Date   Allergy    Anemia    PMH of    Colon polyp    Crohn's    ileitis/mild; Dr [name]   Duodenitis without mention of hemorrhage 2007   EGD   Fibroid, uterine    Dr Cherly Hensen; G 0 P 0   GERD (gastroesophageal reflux disease)    Goiter    CTS,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   Heart murmur    Hiatal hernia 2002   EGD   Hypertension    Internal hemorrhoids    Migraines    when takes vitamins    Rectal polyp 01/24/2012   TUBULAR ADENOMA   Reflux esophagitis 2007   EGD   Stricture and stenosis of  esophagus 2007   EGD   Tubular adenoma of colon    Ulcer    Vitamin D deficiency    Past Surgical History:  Procedure Laterality Date   CATARACT EXTRACTION Bilateral    2024   COLONOSCOPY     polyps;granular ileitis 2006;2007 normal, Dr [name]   COLONOSCOPY WITH PROPOFOL  07/2016   Dr.Pyrtle   HEMORRHOID SURGERY     OPERATIVE HYSTEROSCOPY  08/05/2004   Dr Cherly Hensen   OVARIAN CYST REMOVAL     POLYPECTOMY     UPPER GASTROINTESTINAL ENDOSCOPY     Family History  Problem Relation Age of Onset   Diabetes Mother    Hypertension Mother    Heart disease Mother    Lymphoma Father    Diabetes Brother        CAD; Dialysis   Hypertension Brother    Colon cancer Maternal Grandmother    Rectal cancer Paternal Grandmother    Penile cancer Paternal Grandfather    Heart attack Other        1/2 maternal aunts X 2   Cancer Other    COPD Neg Hx    Esophageal cancer Neg Hx    Stomach cancer Neg Hx    Stroke Neg Hx    Colon polyps Neg Hx    Social History   Socioeconomic History   Marital status: Single    Spouse name: Not on file   Number of children: 0   Years of education: Not on file   Highest education level: Not on file  Occupational History   Occupation: TESTER    Employer: [employer]  Tobacco Use   Smoking status: Never    Passive exposure: Never   Smokeless tobacco: Never  Vaping Use   Vaping status: Never Used  Substance and Sexual Activity   Alcohol use: No   Drug use: No   Sexual activity: Not Currently    Birth control/protection: Post-menopausal  Other Topics Concern   Not on file  Social History Narrative   Right handed   No caffeine   Some College   Lives alone   retired   Social Drivers of Corporate investment banker Strain: Low Risk  (08/23/2023)   Overall Financial Resource Strain (CARDIA)    Difficulty of Paying Living Expenses: Not hard at all  Food Insecurity: No Food Insecurity (08/23/2023)   Hunger Vital Sign    Worried About  Radiation protection practitioner of Food in the Last Year: Never true    Ran Out of Food in the Last Year: Never true  Transportation Needs: No Transportation Needs (08/23/2023)   PRAPARE - Administrator, Civil Service (Medical): No    Lack of Transportation (Non-Medical): No  Physical Activity: Sufficiently Active (08/23/2023)  Exercise Vital Sign    Days of Exercise per Week: 6 days    Minutes of Exercise per Session: 30 min  Stress: No Stress Concern Present (08/23/2023)   [employer] of Occupational Health - Occupational Stress Questionnaire    Feeling of Stress : Not at all  Social Connections: Moderately Integrated (08/23/2023)   Social Connection and Isolation Panel [NHANES]    Frequency of Communication with Friends and Family: More than three times a week    Frequency of Social Gatherings with Friends and Family: More than three times a week    Attends Religious Services: More than 4 times per year    Active Member of [employer] or Organizations: Yes    Attends Engineer, structural: More than 4 times per year    Marital Status: Never married    Tobacco Counseling Counseling given: No    Clinical Intake:  Pre-visit preparation completed: Yes  Pain : No/denies pain     BMI - recorded: 25.23 Nutritional Status: BMI 25 -29 Overweight Nutritional Risks: None Diabetes: No  Lab Results  Component Value Date   HGBA1C 6.1 06/07/2023   HGBA1C 5.9 12/14/2022   HGBA1C 6.4 05/31/2022     How often do you need to have someone help you when you read instructions, pamphlets, or other written materials from your doctor or pharmacy?: 1 - Never  Interpreter Needed?: No  Information entered by :: Hassell Halim, CMA   Activities of Daily Living     08/23/2023    2:17 PM  In your present state of health, do you have any difficulty performing the following activities:  Hearing? 0  Vision? 0  Difficulty concentrating or making decisions? 0  Walking or climbing stairs? 0   Dressing or bathing? 0  Doing errands, shopping? 0  Preparing Food and eating ? N  Using the Toilet? N  In the past six months, have you accidently leaked urine? N  Do you have problems with loss of bowel control? N  Managing your Medications? N  Managing your Finances? N  Housekeeping or managing your Housekeeping? N    Patient Care Team: Pincus Sanes, MD as PCP - General (Internal Medicine) Van Clines, MD as Consulting Physician (Neurology) Jethro Bolus, MD as Consulting Physician (Ophthalmology) Elwyn Reach (Neurology) Marzella Schlein., MD (Ophthalmology)  Indicate any recent Medical Services you may have received from other than [facility] providers in the past year (date may be approximate).     Assessment:   This is a routine wellness examination for Parthenia.  Hearing/Vision screen Hearing Screening - Comments:: Denies hearing difficulties   Vision Screening - Comments:: Wears rx glasses - up to date with routine eye exams with Dr Lucretia Roers   Goals Addressed               This Visit's Progress     Patient Stated (pt-stated)        Patient stated that she's wanting to monitor her diet to help her cholesterol level get better.       Depression Screen     08/23/2023    2:20 PM 08/17/2022   11:26 AM 06/29/2022    3:26 PM 05/31/2022    2:29 PM 02/09/2021    2:32 PM 02/08/2020   11:04 AM 02/08/2020   11:03 AM  PHQ 2/9 Scores  PHQ - 2 Score 0 0 0 0 0 0 0  PHQ- 9 Score 0    0  Fall Risk     08/23/2023    2:18 PM 06/14/2023    2:29 PM 12/09/2022    2:31 PM 08/17/2022   11:28 AM 06/29/2022    3:26 PM  Fall Risk   Falls in the past year? 0 0 0 0 0  Number falls in past yr: 0 0 0 0 0  Injury with Fall? 0 0 0 0 0  Risk for fall due to : No Fall Risks   No Fall Risks No Fall Risks  Follow up Falls prevention discussed;Falls evaluation completed Falls evaluation completed Falls evaluation completed Falls prevention discussed Falls evaluation completed     MEDICARE RISK AT HOME:  Medicare Risk at Home Any stairs in or around the home?: No If so, are there any without handrails?: No Home free of loose throw rugs in walkways, pet beds, electrical cords, etc?: Yes Adequate lighting in your home to reduce risk of falls?: Yes Life alert?: No Use of a cane, walker or w/c?: No Grab bars in the bathroom?: Yes Shower chair or bench in shower?: Yes Elevated toilet seat or a handicapped toilet?: No  TIMED UP AND GO:  Was the test performed?  No  Cognitive Function: 6CIT completed    06/14/2023    7:00 PM 12/09/2022    4:00 PM 06/10/2022    2:00 PM 08/31/2021    3:00 PM 08/26/2020   11:00 AM  MMSE - Mini Mental State Exam  Orientation to time 4 5 5 5 5   Orientation to Place 5 5 5 5 5   Registration 3 3 3 3 3   Attention/ Calculation 5 5 4 5  0  Recall 3 3 3 3 3   Language- name 2 objects 2 2 2 2 2   Language- repeat 1 1 1 1 1   Language- follow 3 step command 3 3 3 3 3   Language- read & follow direction 1 1 1 1 1   Write a sentence 1 1 1 1 1   Copy design 1 1 1 1 1   Total score 29 30 29 30 25       08/13/2019    2:00 PM 12/20/2018    3:00 PM 06/07/2018    9:00 AM  Montreal Cognitive Assessment   Visuospatial/ Executive (0/5) 2 4 4   Naming (0/3) 3 3 3   Attention: Read list of digits (0/2) 0 1 2  Attention: Read list of letters (0/1) 0 1 1  Attention: Serial 7 subtraction starting at 100 (0/3) 0 1 0  Language: Repeat phrase (0/2) 0 2 2  Language : Fluency (0/1) 0 0 0  Abstraction (0/2) 1 2 0  Delayed Recall (0/5) 4 0 3  Orientation (0/6) 6 6 6   Total 16 20 21   Adjusted Score (based on education) 16        08/23/2023    2:21 PM 08/17/2022   11:28 AM  6CIT Screen  What Year? 0 points 0 points  What month? 0 points 0 points  What time? 0 points 0 points  Count back from 20 0 points 0 points  Months in reverse 0 points 0 points  Repeat phrase 4 points 0 points  Total Score 4 points 0 points    Immunizations Immunization History   Administered Date(s) Administered   Influenza,inj,Quad PF,6+ Mos 04/06/2013   Influenza-Unspecified 02/22/2017   Moderna Covid-19 Fall Seasonal Vaccine 25yrs & older 06/14/2022   Moderna SARS-COV2 Booster Vaccination 03/05/2021   PFIZER(Purple Top)SARS-COV-2 Vaccination 07/09/2019, 07/30/2019   Pfizer Covid-19 Firefighter  Booster 27yrs & up 02/12/2020   Pneumococcal Conjugate-13 03/23/2018   Pneumococcal Polysaccharide-23 08/22/2019   Td 12/29/2007   Tdap 02/15/2021   Zoster Recombinant(Shingrix) 06/24/2020, 09/04/2020   Zoster, Live 04/02/2013    Screening Tests Health Maintenance  Topic Date Due   COVID-19 Vaccine (6 - 2024-25 season) 01/16/2023   Medicare Annual Wellness (AWV)  08/22/2024   DEXA SCAN  10/20/2024   MAMMOGRAM  02/27/2025   DTaP/Tdap/Td (3 - Td or Tdap) 02/16/2031   Pneumonia Vaccine 30+ Years old  Completed   Hepatitis C Screening  Completed   Zoster Vaccines- Shingrix  Completed   HPV VACCINES  Aged Out   INFLUENZA VACCINE  Discontinued   Colonoscopy  Discontinued    Health Maintenance  Health Maintenance Due  Topic Date Due   COVID-19 Vaccine (6 - 2024-25 season) 01/16/2023   Health Maintenance Items Addressed:08/23/2023   Additional Screening:  Vision Screening: Recommended annual ophthalmology exams for early detection of glaucoma and other disorders of the eye.  Dental Screening: Recommended annual dental exams for proper oral hygiene  Community Resource Referral / Chronic Care Management: CRR required this visit?  No   CCM required this visit?  No     Plan:     I have personally reviewed and noted the following in the patient's chart:   Medical and social history Use of alcohol, tobacco or illicit drugs  Current medications and supplements including opioid prescriptions. Patient is not currently taking opioid prescriptions. Functional ability and status Nutritional status Physical activity Advanced directives List of other  physicians Hospitalizations, surgeries, and ER visits in previous 12 months Vitals Screenings to include cognitive, depression, and falls Referrals and appointments  In addition, I have reviewed and discussed with patient certain preventive protocols, quality metrics, and best practice recommendations. A written personalized care plan for preventive services as well as general preventive health recommendations were provided to patient.     Darreld Mclean, CMA   08/23/2023   After Visit Summary: (In Person-Printed) AVS printed and given to the patient  Notes: Nothing significant to report at this time.

## 2023-08-23 NOTE — Patient Instructions (Signed)
 Ms. Karen Evans , Thank you for taking time to come for your Medicare Wellness Visit. I appreciate your ongoing commitment to your health goals. Please review the following plan we discussed and let me know if I can assist you in the future.   Referrals/Orders/Follow-Ups/Clinician Recommendations: Aim for 30 minutes of exercise or brisk walking, 6-8 glasses of water, and 5 servings of fruits and vegetables each day.   This is a list of the screening recommended for you and due dates:  Health Maintenance  Topic Date Due   COVID-19 Vaccine (6 - 2024-25 season) 01/16/2023   Medicare Annual Wellness Visit  08/22/2024   DEXA scan (bone density measurement)  10/20/2024   Mammogram  02/27/2025   DTaP/Tdap/Td vaccine (3 - Td or Tdap) 02/16/2031   Pneumonia Vaccine  Completed   Hepatitis C Screening  Completed   Zoster (Shingles) Vaccine  Completed   HPV Vaccine  Aged Out   Flu Shot  Discontinued   Colon Cancer Screening  Discontinued    Advanced directives: (Provided) Advance directive discussed with you today. I have provided a copy for you to complete at home and have notarized. Once this is complete, please bring a copy in to our office so we can scan it into your chart.   Next Medicare Annual Wellness Visit scheduled for next year: Yes

## 2023-08-24 ENCOUNTER — Ambulatory Visit: Attending: Cardiovascular Disease | Admitting: Pharmacist Clinician (PhC)/ Clinical Pharmacy Specialist

## 2023-08-24 DIAGNOSIS — E785 Hyperlipidemia, unspecified: Secondary | ICD-10-CM

## 2023-08-25 ENCOUNTER — Other Ambulatory Visit: Payer: Self-pay | Admitting: Internal Medicine

## 2023-08-25 ENCOUNTER — Encounter: Payer: Self-pay | Admitting: Pharmacist Clinician (PhC)/ Clinical Pharmacy Specialist

## 2023-08-25 NOTE — Progress Notes (Signed)
 Patient came into office for Repatha injection teaching.  She was able to do the first dose at home successfully, but was still unsure.    Reviewed medication use and injection technique.  Patient played with sample pen several times to get comfortable with the device.   She gave second dose successfully.  Answered all questions.

## 2023-09-06 ENCOUNTER — Encounter: Payer: Self-pay | Admitting: Internal Medicine

## 2023-09-06 DIAGNOSIS — Z1231 Encounter for screening mammogram for malignant neoplasm of breast: Secondary | ICD-10-CM

## 2023-09-06 NOTE — Telephone Encounter (Unsigned)
 Copied from CRM [id number]. Topic: Clinical - Medical Advice >> Sep 06, 2023 10:15 AM Rosaria Common wrote: Reason for CRM: Pt is calling to get medical advice on next upcoming shots she is needing. Callback number is [phone].

## 2023-09-07 ENCOUNTER — Telehealth: Payer: Self-pay | Admitting: Internal Medicine

## 2023-09-07 NOTE — Telephone Encounter (Signed)
 Pt called in asking which shots she needs to get for the year.   Please advise pt: [phone]

## 2023-09-08 ENCOUNTER — Telehealth: Payer: Self-pay | Admitting: Pharmacist

## 2023-09-08 NOTE — Telephone Encounter (Signed)
 She is only due for COVID booster.  Everything else is up to date.

## 2023-09-08 NOTE — Telephone Encounter (Signed)
 Message left for patient today.

## 2023-09-08 NOTE — Telephone Encounter (Signed)
 Case [id number] Verbal given for replacement w/ [place] rx

## 2023-09-09 ENCOUNTER — Ambulatory Visit: Payer: Self-pay | Admitting: *Deleted

## 2023-09-09 ENCOUNTER — Encounter: Payer: Self-pay | Admitting: Physician Assistant

## 2023-09-09 NOTE — Telephone Encounter (Signed)
 Spoke with patient today.

## 2023-09-09 NOTE — Telephone Encounter (Signed)
  Chief Complaint: breast pain Symptoms: breast pain- probable bra pain Frequency: 2 months Pertinent Negatives: Patient denies skin changes, lumps Disposition: [] ED /[] Urgent Care (no appt availability in office) / [x] Appointment(In office/virtual)/ []  [facility]/ [] Home Care/ [] Refused Recommended Disposition /[] [facility]/ []  Follow-up with PCP   Copied from CRM [id number]. Topic: Clinical - Red Word Triage >> Sep 09, 2023 11:33 AM Kita Perish H wrote: [facility] that prompted transfer to Nurse Triage: Pain in left breast comes and goes Reason for Disposition  [1] Breast pain AND [2] cause is not known  Answer Assessment - Initial Assessment Questions 1. SYMPTOM: "What's the main symptom you're concerned about?"  (e.g., lump, pain, rash, nipple discharge)     Breast pain- bra can cause pain 2. LOCATION: "Where is the pain located?"     Left outer quadrant of left breast 3. ONSET: "When did pain  start?"     Comes and goes- 2 months 4. PRIOR HISTORY: "Do you have any history of prior problems with your breasts?" (e.g., lumps, cancer, fibrocystic breast disease)     no 5. CAUSE: "What do you think is causing this symptom?"     Bra  6. OTHER SYMPTOMS: "Do you have any other symptoms?" (e.g., fever, breast pain, redness or rash, nipple discharge)     Just breast pain  Protocols used: Breast Symptoms-A-AH

## 2023-09-09 NOTE — Telephone Encounter (Signed)
 Copied from CRM [id number]. Topic: General - Other >> Sep 07, 2023 11:49 AM Karen Evans wrote: Reason for CRM: Patient called in to follow up on previous call regarding her next upcoming shots that may be needed reached out to Tara from [facility] for assistance per Karen Evans Dr. Belvia Boyer or [facility] nurse would have to go through her history and verify what may be needed and will send either or a message so they can verify and call patient back. Patient notified.  >> Sep 08, 2023 10:54 AM Karen Evans wrote: Patient is calling to get an update on her shots. I told the pt that Dr. Donnette Gal or her nurse will have to review the pt records and they would call her. Patient is requesting a callback at [phone]   >> Sep 09, 2023 11:27 AM Karen Evans wrote: Patient is calling following up on message regarding shots, please reach out to patient.  Karen Evans [phone]

## 2023-09-14 ENCOUNTER — Encounter: Payer: Self-pay | Admitting: Internal Medicine

## 2023-09-14 ENCOUNTER — Ambulatory Visit (INDEPENDENT_AMBULATORY_CARE_PROVIDER_SITE_OTHER): Admitting: Internal Medicine

## 2023-09-14 VITALS — BP 138/80 | HR 55 | Temp 98.0°F | Ht 64.0 in | Wt 148.0 lb

## 2023-09-14 DIAGNOSIS — R21 Rash and other nonspecific skin eruption: Secondary | ICD-10-CM | POA: Insufficient documentation

## 2023-09-14 DIAGNOSIS — I10 Essential (primary) hypertension: Secondary | ICD-10-CM

## 2023-09-14 DIAGNOSIS — N644 Mastodynia: Secondary | ICD-10-CM | POA: Insufficient documentation

## 2023-09-14 NOTE — Assessment & Plan Note (Signed)
 Acute Posterior neck  Itchy Using cortisone cream - can use betamethasone  cream she has at home - advised to use for a short period of time at a time

## 2023-09-14 NOTE — Assessment & Plan Note (Signed)
 Acute A few months of breast tenderness lower left breast - worse recently No palpable lumps, skin change or nipple discharge Diagnostic mammogram ordered, US  if needed

## 2023-09-14 NOTE — Progress Notes (Signed)
 Subjective:    Patient ID: Karen Evans, female    DOB: 1950-07-11, 73 y.o.   MRN: [id number]      HPI Kamre[facility] is here for  Chief Complaint  Patient presents with   Breast Pain    Left breast pain that comes and goes    Left breast pain - started about a few months ago - bothering her more recently.  It is intermittent.  Pain is in lower portion of the breast.  The pain is not localized.  Her last mammogram was October 2024.Aaron Aas     Hives - having itchy breakout on the back of her neck.   He has been placing cortisone cream which helps a little bit, but it still there.     Medications and allergies reviewed with patient and updated if appropriate.  Current Outpatient Medications on File Prior to Visit  Medication Sig Dispense Refill   acetaminophen  (TYLENOL ) 500 MG tablet Take 250-500 mg by mouth every 6 (six) hours as needed for headache (pain).     aspirin  EC 81 MG tablet Take 1 tablet (81 mg total) by mouth daily. Swallow whole. 30 tablet 12   atenolol  (TENORMIN ) 25 MG tablet TAKE 1 TABLET BY MOUTH DAILY 100 tablet 2   betamethasone  valerate ointment (VALISONE ) 0.1 % Apply a pea sized amount BID for up to 2 weeks as needed (Patient taking differently: Apply 1 Application topically 2 (two) times daily as needed (irritation).) 30 g 0   Calcium  Carbonate-Vitamin D  (CALTRATE 600+D PO) Take 1 tablet by mouth daily.      cholecalciferol (VITAMIN D ) 1000 units tablet Take 1,000 Units by mouth daily.     Evolocumab  (REPATHA  SURECLICK) 140 MG/ML SOAJ Inject 140 mg into the skin every 14 (fourteen) days. 2 mL 11   fexofenadine (ALLEGRA) 180 MG tablet Take 180 mg by mouth daily.     Fluticasone  Propionate (FLONASE  NA) Place 1 spray into the nose daily as needed (congestion).     hydrocortisone  (ANUSOL -HC) 25 MG suppository Place 25 mg rectally as needed for hemorrhoids or anal itching.     ketorolac  (ACULAR ) 0.5 % ophthalmic solution Place 1 drop into the left eye 4 (four) times daily.      Lactase (LACTAID PO) Take 1 tablet by mouth 2 (two) times daily as needed (stomach upset/ gas).      Multiple Vitamin (MULTIVITAMIN WITH MINERALS) TABS tablet Take 1 tablet by mouth daily.     olopatadine (PATANOL) 0.1 % ophthalmic solution Place 1 drop into both eyes 1 day or 1 dose. Dr. Brooks Cao     Propylene Glycol (SYSTANE COMPLETE OP) Place 1 drop into both eyes at bedtime.     telmisartan  (MICARDIS ) 80 MG tablet TAKE 1 TABLET BY MOUTH DAILY 90 tablet 3   No current facility-administered medications on file prior to visit.    Review of Systems     Objective:   Vitals:   09/14/23 1316  BP: 138/80  Pulse: (!) 55  Temp: 98 F (36.7 C)  SpO2: 99%   BP Readings from Last 3 Encounters:  09/14/23 138/80  08/23/23 132/70  07/27/23 (!) 148/82   Wt Readings from Last 3 Encounters:  09/14/23 148 lb (67.1 kg)  08/23/23 147 lb (66.7 kg)  07/27/23 150 lb 8 oz (68.3 kg)   Body mass index is 25.4 kg/m.    Physical Exam Constitutional:      General: She is not in acute distress.  Appearance: Normal appearance. She is not ill-appearing.  HENT:     Head: Normocephalic and atraumatic.  Chest:  Breasts:    Right: Normal. No swelling, bleeding, inverted nipple, mass, nipple discharge, skin change or tenderness.     Left: Normal. No swelling, bleeding, inverted nipple, mass, nipple discharge, skin change or tenderness.  Lymphadenopathy:     Upper Body:     Right upper body: No supraclavicular, axillary or pectoral adenopathy.     Left upper body: No supraclavicular, axillary or pectoral adenopathy.  Skin:    General: Skin is warm and dry.     Findings: Rash (posterior neck at hairline) present.  Neurological:     Mental Status: She is alert.            Assessment & Plan:    See Problem List for Assessment and Plan of chronic medical problems.

## 2023-09-14 NOTE — Patient Instructions (Addendum)
      Medications changes include :   None    A mammogram was ordered.       Return for follow up as scheduled.

## 2023-09-14 NOTE — Assessment & Plan Note (Signed)
 Chronic Blood pressure borderline controlled Monitor BP at home Continue atenolol  25 mg daily, telmisartan  80 mg daily

## 2023-09-19 ENCOUNTER — Other Ambulatory Visit: Payer: Self-pay | Admitting: Internal Medicine

## 2023-09-19 DIAGNOSIS — N760 Acute vaginitis: Secondary | ICD-10-CM

## 2023-09-19 MED ORDER — BETAMETHASONE VALERATE 0.1 % EX OINT: TOPICAL_OINTMENT | CUTANEOUS | 0 refills | Status: AC

## 2023-09-19 NOTE — Telephone Encounter (Signed)
 Copied from CRM [id number]. Topic: Clinical - Medication Refill >> Sep 19, 2023 11:00 AM Freya Jesus wrote: Most Recent Primary Care Visit:  Provider: BURNS, Beckey Bourgeois  Department: [facility]  Visit Type: ACUTE  Date: 09/14/2023  Medication: betamethasone  valerate ointment (VALISONE ) 0.1 % [[id number]]  Has the patient contacted their pharmacy? No (Agent: If no, request that the patient contact the pharmacy for the refill. If patient does not wish to contact the pharmacy document the reason why and proceed with request.) (Agent: If yes, when and what did the pharmacy advise?)  Is this the correct pharmacy for this prescription? Yes If no, delete pharmacy and type the correct one.  This is the patient's preferred pharmacy:  [id number] [place], [place] - [address] [place] [address] Phone: [phone] Fax: [phone]   Has the prescription been filled recently? No  Is the patient out of the medication? Yes  Has the patient been seen for an appointment in the last year OR does the patient have an upcoming appointment? Yes  Can we respond through MyChart? Yes  Agent: Please be advised that Rx refills may take up to 3 business days. We ask that you follow-up with your pharmacy.

## 2023-09-21 ENCOUNTER — Telehealth: Payer: Self-pay | Admitting: Internal Medicine

## 2023-09-21 NOTE — Telephone Encounter (Signed)
 Pt c/o medication issue:  1. Name of Medication: Evolocumab  (REPATHA  SURECLICK) 140 MG/ML SOAJ [[id number]]   2. How are you currently taking this medication (dosage and times per day)? 140 mg  3. Are you having a reaction (difficulty breathing--STAT)? Yes she has had a reaction    4. What is your medication issue? She has dark marks and hives on her neck.  She is putting cream on it and helped a little   Best number [phone]  [phone]

## 2023-09-21 NOTE — Telephone Encounter (Signed)
 Called and spoke with patient who states she's used a total of 3 Repatha  injections with her last injection on 09/01/23. She noticed a rash roughly a week ago. Describes it as hives on her neck, raised, itching. She started using benadryl cream and this has much improved. She feels that this has developed from the Repatha , but states she's not entirely sure. Her PCP is going to write in a cream for her to try and told her to not give herself a shot until after she gets back from her upcoming vacation. Then she is going to try it again to determine if it is in fact Repatha . Will call and update us  if needed.

## 2023-10-11 ENCOUNTER — Ambulatory Visit
Admission: RE | Admit: 2023-10-11 | Discharge: 2023-10-11 | Disposition: A | Discharge status: Home / Self Care | Source: Ambulatory Visit | Attending: Internal Medicine

## 2023-10-11 ENCOUNTER — Ambulatory Visit: Admission: RE | Admit: 2023-10-11 | Source: Ambulatory Visit

## 2023-10-11 DIAGNOSIS — N644 Mastodynia: Secondary | ICD-10-CM

## 2023-10-18 ENCOUNTER — Ambulatory Visit: Payer: Medicare Other | Admitting: Podiatry

## 2023-10-18 ENCOUNTER — Encounter: Payer: Self-pay | Admitting: Podiatry

## 2023-10-18 DIAGNOSIS — B351 Tinea unguium: Secondary | ICD-10-CM | POA: Diagnosis not present

## 2023-10-18 DIAGNOSIS — M79674 Pain in right toe(s): Secondary | ICD-10-CM

## 2023-10-18 DIAGNOSIS — M79675 Pain in left toe(s): Secondary | ICD-10-CM | POA: Diagnosis not present

## 2023-10-18 NOTE — Progress Notes (Signed)

## 2023-11-02 DIAGNOSIS — G459 Transient cerebral ischemic attack, unspecified: Secondary | ICD-10-CM | POA: Diagnosis not present

## 2023-11-02 DIAGNOSIS — E785 Hyperlipidemia, unspecified: Secondary | ICD-10-CM | POA: Diagnosis not present

## 2023-11-04 ENCOUNTER — Ambulatory Visit (HOSPITAL_BASED_OUTPATIENT_CLINIC_OR_DEPARTMENT_OTHER): Payer: Self-pay | Admitting: Internal Medicine

## 2023-11-04 LAB — NMR, LIPOPROFILE
Cholesterol, Total: 174 mg/dL (ref 100–199)
HDL Particle Number: 48.7 umol/L (ref >=30.5)
HDL-C: 80 mg/dL (ref >39)
LDL Particle Number: 617 nmol/L (ref <1000)
LDL Size: 20.4 nm — ABNORMAL LOW (ref >20.5)
LDL-C (NIH Calc): 71 mg/dL (ref 0–99)
LP-IR Score: <25 (ref <=45)
Small LDL Particle Number: 357 nmol/L (ref <=527)
Triglycerides: 139 mg/dL (ref 0–149)

## 2023-11-04 LAB — LIPOPROTEIN A (LPA): Lipoprotein (a): 71.4 nmol/L (ref <75.0)

## 2023-11-08 ENCOUNTER — Ambulatory Visit: Admitting: Physician Assistant

## 2023-11-08 NOTE — Progress Notes (Incomplete)
 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 RH female with a history of HTN, HLD, Prediabetes, recent admission for TIA symptoms versus complex migraine 11/2021 without recurrence and with negative neuro workup and the diagnosis of mild cognitive impairment likely of vascular etiology presenting today in follow-up for evaluation of memory loss. Patient is not on antidepressant medication at this time.  She is able to participate in her ADLs without difficulty, continues to drive without any issues.  Mood is good.  She tries to remain active.***.     Recommendations:   Follow up in   months. Recommend good control of cardiovascular risk factors Continue to control mood as per PCP    Subjective:   This patient is accompanied in the office by ***  who supplements the history. Previous records as well as any outside records available were reviewed prior to todays visit.   Patient was last seen on 06/14/2023 with MMSE of 29/30***.    Any changes in memory since last visit? Karen Evans to watch game shows, likes to remain active doing drawings, painting, attending [employer].  Has been traveling the her mother. repeats oneself?  Endorsed Disoriented when walking into a room?  Patient denies ***  Misplacing objects?  Patient denies   Wandering behavior?   Denies. Any personality changes since last visit? Denies.   Any worsening depression?: denies.   Hallucinations or paranoia?  Denies.   Seizures?   Denies.    Any sleep changes? Sleeps well***.   Denies vivid dreams, REM behavior or sleepwalking   Sleep apnea?   denies ***  Any hygiene concerns?   Denies.   Independent of bathing and dressing?  Endorsed  Does the patient needs help with medications? Patient is in charge *** Who is in charge of the finances?  Patient is in charge   *** Any changes in appetite?  denies ***   Patient have trouble swallowing?   Denies.   Does the patient cook?  Any kitchen accidents such as leaving the stove on?   Denies.   Any headaches?    Denies.   Vision changes? Denies. Chronic pain?  Denies.   Ambulates with difficulty?    Denies. ***  Recent falls or head injuries?    Denies.      Unilateral weakness, numbness or tingling?  Denies.   Any tremors?  Denies.   Any anosmia?    Denies.   Any incontinence of urine?  Denies.   Any bowel dysfunction?  Denies.      Patient lives alone.*** Does the patient drive?  Yes, denies getting lost***   History on Initial Assessment 06/07/2018: This is a 73 year old right-handed woman with a history of hypertension, hyperlipidemia, presenting for evaluation of memory concerns. She reports that she started noticing memory changes when they started letting people go at the plant in 2016. She noticed she was forgetting things. She reported symptoms to Dr. Geofm in November 2019 where she would leave food out without putting it away, one time she left ice cream container out after eating it, and left a doggie bag or groceries in her car. She has burnt food she forgot was cooking and now has to make herself stay in the kitchen. She lost her keys one time and has started putting them in one place. She denies getting lost driving. No missed bills or medications. 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 She lives alone, her family (mother and brother) live in [place] . She has told them of her memory concerns, they don't think I need to come here. Her maternal aunt had memory issues. No history of concussions or alcohol use.  She has occasional right hand and left finger tingling. She denies any headaches, dizziness, diplopia, dysarthria/dysphagia, neck/back pain, bowel/bladder dysfunction, anosmia, or tremors. No falls. Sleep is good. Mood I think it's good. There is no family to corroborate history but  there does not appear to be any paranoia or hallucinations.    Diagnostic Data: MRI brain without contrast done 07/2018 which did not show any acute changes. There was moderately advanced chronic microvascular disease.   Past Medical History:  Diagnosis Date   Allergy    Anemia    PMH of    Colon polyp    Crohn's    ileitis/mild; Dr Jakie   Duodenitis without mention of hemorrhage 2007   EGD   Fibroid, uterine    Dr Rutherford; G 0 P 0   GERD (gastroesophageal reflux disease)    Goiter    CTS,RUE > LUE   Heart murmur    Hiatal hernia 2002   EGD   Hypertension    Internal hemorrhoids    Migraines    when takes vitamins    Rectal polyp 01/24/2012   TUBULAR ADENOMA   Reflux esophagitis 2007   EGD   Stricture and stenosis of esophagus 2007   EGD   Tubular adenoma of colon    Ulcer    Vitamin D  deficiency      Past Surgical History:  Procedure Laterality Date   CATARACT EXTRACTION Bilateral    2024   COLONOSCOPY     polyps;granular ileitis 2006;2007 normal, Dr Jakie   COLONOSCOPY WITH PROPOFOL   07/2016   Dr.Pyrtle   HEMORRHOID SURGERY     OPERATIVE HYSTEROSCOPY  08/05/2004   Dr Rutherford   OVARIAN CYST REMOVAL     POLYPECTOMY     UPPER GASTROINTESTINAL ENDOSCOPY       PREVIOUS MEDICATIONS:   CURRENT MEDICATIONS:  Outpatient Encounter Medications as of 11/08/2023  Medication Sig   acetaminophen  (TYLENOL ) 500 MG tablet Take 250-500 mg by mouth every 6 (six) hours as needed for headache (pain).   aspirin  EC 81 MG tablet Take 1 tablet (81 mg total) by mouth daily. Swallow whole.   atenolol  (TENORMIN ) 25 MG tablet TAKE 1 TABLET BY MOUTH DAILY   betamethasone  valerate ointment (VALISONE ) 0.1 % Apply a pea sized amount BID for up to 2 weeks as needed   Calcium  Carbonate-Vitamin D  (CALTRATE 600+D PO) Take 1 tablet by mouth daily.    cholecalciferol (VITAMIN D ) 1000 units tablet Take 1,000 Units by mouth daily.   Evolocumab  (REPATHA  SURECLICK) 140 MG/ML SOAJ Inject  140 mg into the skin every 14 (fourteen) days.   fexofenadine (ALLEGRA) 180 MG tablet Take 180 mg by mouth daily.   Fluticasone  Propionate (FLONASE  NA) Place 1 spray into the nose daily as needed (congestion).   hydrocortisone  (ANUSOL -HC) 25 MG suppository Place 25 mg rectally as needed for hemorrhoids or anal itching.   ketorolac  (ACULAR ) 0.5 % ophthalmic solution Place 1 drop into the left eye 4 (four) times daily.   Lactase (LACTAID PO) Take 1 tablet by mouth 2 (two) times daily as needed (stomach upset/ gas).    Multiple Vitamin (MULTIVITAMIN  WITH MINERALS) TABS tablet Take 1 tablet by mouth daily.   olopatadine (PATANOL) 0.1 % ophthalmic solution Place 1 drop into both eyes 1 day or 1 dose. Dr. Milissa   Propylene Glycol (SYSTANE COMPLETE OP) Place 1 drop into both eyes at bedtime.   telmisartan  (MICARDIS ) 80 MG tablet TAKE 1 TABLET BY MOUTH DAILY   No facility-administered encounter medications on file as of 11/08/2023.     Objective:     PHYSICAL EXAMINATION:    VITALS:  There were no vitals filed for this visit.  GEN:  The patient appears stated age and is in NAD. HEENT:  Normocephalic, atraumatic.   Neurological examination:  General: NAD, well-groomed, appears stated age. Orientation: The patient is alert. Oriented to person, place and not to date.*** Cranial nerves: There is good facial symmetry.The speech is fluent and clear. No aphasia or dysarthria. Fund of knowledge is appropriate. Recent memory impaired and remote memory is normal.  Attention and concentration are normal.  Able to name objects and repeat phrases.  Hearing is intact to conversational tone ***.   Delayed recall *** Sensation: Sensation is intact to light touch throughout Motor: Strength is at least antigravity x4. DTR's 2/4 in UE/LE      08/13/2019    2:00 PM 12/20/2018    3:00 PM 06/07/2018    9:00 AM  Montreal Cognitive Assessment   Visuospatial/ Executive (0/5) 2 4 4   Naming (0/3) 3 3 3   Attention:  Read list of digits (0/2) 0 1 2  Attention: Read list of letters (0/1) 0 1 1  Attention: Serial 7 subtraction starting at 100 (0/3) 0 1 0  Language: Repeat phrase (0/2) 0 2 2  Language : Fluency (0/1) 0 0 0  Abstraction (0/2) 1 2 0  Delayed Recall (0/5) 4 0 3  Orientation (0/6) 6 6 6   Total 16 20 21   Adjusted Score (based on education) 16         06/14/2023    7:00 PM 12/09/2022    4:00 PM 06/10/2022    2:00 PM  MMSE - Mini Mental State Exam  Orientation to time 4 5 5   Orientation to Place 5 5 5   Registration 3 3 3   Attention/ Calculation 5 5 4   Recall 3 3 3   Language- name 2 objects 2 2 2   Language- repeat 1 1 1   Language- follow 3 step command 3 3 3   Language- read & follow direction 1 1 1   Write a sentence 1 1 1   Copy design 1 1 1   Total score 29 30 29        Movement examination: Tone: There is normal tone in the UE/LE Abnormal movements:  no tremor.  No myoclonus.  No asterixis.   Coordination:  There is no decremation with RAM's. Normal finger to nose  Gait and Station: The patient has no difficulty arising out of a deep-seated chair without the use of the hands. The patient's stride length is good.  Gait is cautious and narrow.   Thank you for allowing us  the opportunity to participate in the care of this nice patient. Please do not hesitate to contact us  for any questions or concerns.   Total time spent on today's visit was *** minutes dedicated to this patient today, preparing to see patient, examining the patient, ordering tests and/or medications and counseling the patient, documenting clinical information in the EHR or other health record, independently interpreting results and communicating results to the patient/family, discussing treatment  and goals, answering patient's questions and coordinating care.  Cc:  Geofm Glade PARAS, MD  Camie Sevin 11/08/2023 6:05 AM

## 2023-11-11 ENCOUNTER — Emergency Department (HOSPITAL_COMMUNITY)
Admission: EM | Admit: 2023-11-11 | Discharge: 2023-11-11 | Disposition: A | Discharge status: Home / Self Care | Attending: Emergency Medicine | Admitting: Emergency Medicine

## 2023-11-11 ENCOUNTER — Encounter (HOSPITAL_COMMUNITY): Payer: Self-pay

## 2023-11-11 ENCOUNTER — Emergency Department (HOSPITAL_COMMUNITY)

## 2023-11-11 ENCOUNTER — Ambulatory Visit: Payer: Self-pay | Admitting: *Deleted

## 2023-11-11 ENCOUNTER — Other Ambulatory Visit: Payer: Self-pay

## 2023-11-11 DIAGNOSIS — R030 Elevated blood-pressure reading, without diagnosis of hypertension: Secondary | ICD-10-CM

## 2023-11-11 DIAGNOSIS — R072 Precordial pain: Secondary | ICD-10-CM | POA: Diagnosis not present

## 2023-11-11 DIAGNOSIS — R079 Chest pain, unspecified: Secondary | ICD-10-CM | POA: Diagnosis not present

## 2023-11-11 DIAGNOSIS — Z7982 Long term (current) use of aspirin: Secondary | ICD-10-CM | POA: Insufficient documentation

## 2023-11-11 DIAGNOSIS — I10 Essential (primary) hypertension: Secondary | ICD-10-CM | POA: Insufficient documentation

## 2023-11-11 LAB — TROPONIN I (HIGH SENSITIVITY)
Troponin I (High Sensitivity): 6 ng/L (ref <18)
Troponin I (High Sensitivity): 5 ng/L (ref <18)

## 2023-11-11 LAB — CBC
HCT: 41.3 % (ref 36.0–46.0)
Hemoglobin: 13 g/dL (ref 12.0–15.0)
MCH: 26.6 pg (ref 26.0–34.0)
MCHC: 31.5 g/dL (ref 30.0–36.0)
MCV: 84.6 fL (ref 80.0–100.0)
Platelets: 239 10*3/uL (ref 150–400)
RBC: 4.88 MIL/uL (ref 3.87–5.11)
RDW: 13.6 % (ref 11.5–15.5)
WBC: 6.9 10*3/uL (ref 4.0–10.5)
nRBC: 0 % (ref 0.0–0.2)

## 2023-11-11 LAB — BASIC METABOLIC PANEL WITH GFR
Anion gap: 9 (ref 5–15)
BUN: 10 mg/dL (ref 8–23)
CO2: 22 mmol/L (ref 22–32)
Calcium: 9.4 mg/dL (ref 8.9–10.3)
Chloride: 107 mmol/L (ref 98–111)
Creatinine, Ser: 0.61 mg/dL (ref 0.44–1.00)
GFR, Estimated: >60 mL/min (ref >60)
Glucose, Bld: 103 mg/dL — ABNORMAL HIGH (ref 70–99)
Potassium: 3.8 mmol/L (ref 3.5–5.1)
Sodium: 138 mmol/L (ref 135–145)

## 2023-11-11 MED ORDER — ALUM & MAG HYDROXIDE-SIMETH 200-200-20 MG/5ML PO SUSP
30.0000 mL | Freq: Once | ORAL | Status: AC
  Administered 2023-11-11: 30 mL via ORAL
  Filled 2023-11-11: qty 30

## 2023-11-11 MED ORDER — ACETAMINOPHEN 325 MG PO TABS
650.0000 mg | ORAL_TABLET | Freq: Once | ORAL | Status: AC
  Administered 2023-11-11: 650 mg via ORAL
  Filled 2023-11-11: qty 2

## 2023-11-11 MED ORDER — FAMOTIDINE 20 MG PO TABS
20.0000 mg | ORAL_TABLET | Freq: Once | ORAL | Status: AC
  Administered 2023-11-11: 20 mg via ORAL
  Filled 2023-11-11: qty 1

## 2023-11-11 NOTE — Telephone Encounter (Signed)
 Message left for patient.  Pneumonia vaccine series completed and if she was due for a booster she can not have one until 2026.

## 2023-11-11 NOTE — ED Triage Notes (Signed)
 Pt here for substernal chest pain that started last night.  C/O [facility]. Denies n/v

## 2023-11-11 NOTE — ED Provider Notes (Signed)
 [facility] AT [facility] Provider Note   CSN: [id number] Arrival date & time: 11/11/23  1009     Patient presents with: Chest Pain   Karen Evans is a 73 y.o. female.   Patient c/o mid chest pain in past two days,at rest, dull, constant, non radiating, not pleuritic. No exertional chest pain. Occasional non prod cough. No sore throat, congestion, fevers or other uri symptoms. No leg pain or swelling. Denies hx cad. Denies hx dvt or pe. No chest wall injury or strain. ?occasional heartburn sensation.   The history is provided by the patient and medical records.  Chest Pain Associated symptoms: no abdominal pain, no back pain, no fever, no headache, no nausea, no palpitations, no shortness of breath and no vomiting        Prior to Admission medications   Medication Sig Start Date End Date Taking? Authorizing Provider  acetaminophen  (TYLENOL ) 500 MG tablet Take 250-500 mg by mouth every 6 (six) hours as needed for headache (pain).    [provider]  aspirin  EC 81 MG tablet Take 1 tablet (81 mg total) by mouth daily. Swallow whole. 12/09/21   Fairy Frames, MD  atenolol  (TENORMIN ) 25 MG tablet TAKE 1 TABLET BY MOUTH DAILY 08/26/23   Geofm Glade PARAS, MD  betamethasone  valerate ointment (VALISONE ) 0.1 % Apply a pea sized amount BID for up to 2 weeks as needed 09/19/23   Geofm Glade PARAS, MD  Calcium  Carbonate-Vitamin D  (CALTRATE 600+D PO) Take 1 tablet by mouth daily.     [provider]  cholecalciferol (VITAMIN D ) 1000 units tablet Take 1,000 Units by mouth daily.    [provider]  Evolocumab  (REPATHA  SURECLICK) 140 MG/ML SOAJ Inject 140 mg into the skin every 14 (fourteen) days. 08/02/23   Hilty, Vinie BROCKS, MD  fexofenadine (ALLEGRA) 180 MG tablet Take 180 mg by mouth daily.    [provider]  Fluticasone  Propionate (FLONASE  NA) Place 1 spray into the nose daily as needed (congestion).    [provider]   hydrocortisone  (ANUSOL -HC) 25 MG suppository Place 25 mg rectally as needed for hemorrhoids or anal itching.    [provider]  ketorolac  (ACULAR ) 0.5 % ophthalmic solution Place 1 drop into the left eye 4 (four) times daily. 12/03/22   [provider]  Lactase (LACTAID PO) Take 1 tablet by mouth 2 (two) times daily as needed (stomach upset/ gas).     [provider]  Multiple Vitamin (MULTIVITAMIN WITH MINERALS) TABS tablet Take 1 tablet by mouth daily.    [provider]  olopatadine (PATANOL) 0.1 % ophthalmic solution Place 1 drop into both eyes 1 day or 1 dose. Dr. Milissa    [provider]  Propylene Glycol (SYSTANE COMPLETE OP) Place 1 drop into both eyes at bedtime.    [provider]  telmisartan  (MICARDIS ) 80 MG tablet TAKE 1 TABLET BY MOUTH DAILY 02/09/23   Geofm Glade PARAS, MD    Allergies: Cefprozil, Erythromycin, Esomeprazole magnesium, Penicillins, Sulfonamide derivatives, Amlodipine , Crestor  [rosuvastatin  calcium ], Crestor  [rosuvastatin ], Erythromycin base, Hydrochlorothiazide , Other, Pravastatin , Sulfa antibiotics, Atorvastatin , Doxycycline , and Zetia  [ezetimibe ]    Review of Systems  Constitutional:  Negative for chills and fever.  HENT:  Negative for sore throat.   Eyes:  Negative for redness.  Respiratory:  Negative for shortness of breath.   Cardiovascular:  Positive for chest pain. Negative for palpitations and leg swelling.  Gastrointestinal:  Negative for abdominal pain, nausea and  vomiting.  Genitourinary:  Negative for flank pain.  Musculoskeletal:  Negative for back pain and neck pain.  Skin:  Negative for rash.  Neurological:  Negative for headaches.    Updated Vital Signs BP (!) 179/63 (BP Location: Right Arm)   Pulse 63   Temp 98.3 F (36.8 C) (Oral)   Resp 16   Ht 1.6 m (5' 3)   Wt 68 kg   SpO2 96%   BMI 26.57 kg/m   Physical Exam Vitals and nursing note reviewed.  Constitutional:       Appearance: Normal appearance. She is well-developed.  HENT:     Head: Atraumatic.     Nose: Nose normal.     Mouth/Throat:     Mouth: Mucous membranes are moist.   Eyes:     General: No scleral icterus.    Conjunctiva/sclera: Conjunctivae normal.   Neck:     Trachea: No tracheal deviation.   Cardiovascular:     Rate and Rhythm: Normal rate and regular rhythm.     Pulses: Normal pulses.     Heart sounds: Normal heart sounds. No murmur heard.    No friction rub. No gallop.  Pulmonary:     Effort: Pulmonary effort is normal. No respiratory distress.     Breath sounds: Normal breath sounds.  Chest:     Chest wall: No tenderness.  Abdominal:     General: Bowel sounds are normal. There is no distension.     Palpations: Abdomen is soft.     Tenderness: There is no abdominal tenderness.  Genitourinary:    Comments: No cva tenderness.   Musculoskeletal:        General: No swelling or tenderness.     Cervical back: Normal range of motion and neck supple. No rigidity. No muscular tenderness.     Right lower leg: No edema.     Left lower leg: No edema.   Skin:    General: Skin is warm and dry.     Findings: No rash.   Neurological:     Mental Status: She is alert.     Comments: Alert, speech normal.   Psychiatric:        Mood and Affect: Mood normal.     (all labs ordered are listed, but only abnormal results are displayed) Results for orders placed or performed during the hospital encounter of 11/11/23  Basic metabolic panel   Collection Time: 11/11/23 10:37 AM  Result Value Ref Range   Sodium 138 135 - 145 mmol/L   Potassium 3.8 3.5 - 5.1 mmol/L   Chloride 107 98 - 111 mmol/L   CO2 22 22 - 32 mmol/L   Glucose, Bld 103 (H) 70 - 99 mg/dL   BUN 10 8 - 23 mg/dL   Creatinine, Ser 9.38 0.44 - 1.00 mg/dL   Calcium  9.4 8.9 - 10.3 mg/dL   GFR, Estimated >39 >39 mL/min   Anion gap 9 5 - 15  CBC   Collection Time: 11/11/23 10:37 AM  Result Value Ref Range   WBC 6.9 4.0  - 10.5 K/uL   RBC 4.88 3.87 - 5.11 MIL/uL   Hemoglobin 13.0 12.0 - 15.0 g/dL   HCT 58.6 63.9 - 53.9 %   MCV 84.6 80.0 - 100.0 fL   MCH 26.6 26.0 - 34.0 pg   MCHC 31.5 30.0 - 36.0 g/dL   RDW 86.3 88.4 - 84.4 %   Platelets 239 150 - 400 K/uL   nRBC 0.0 0.0 -  0.2 %  Troponin I (High Sensitivity)   Collection Time: 11/11/23 10:37 AM  Result Value Ref Range   Troponin I (High Sensitivity) 6 <18 ng/L  Troponin I (High Sensitivity)   Collection Time: 11/11/23 12:37 PM  Result Value Ref Range   Troponin I (High Sensitivity) 5 <18 ng/L   DG Chest 1 View Result Date: 11/11/2023 CLINICAL DATA:  Chest pain EXAM: CHEST  1 VIEW COMPARISON:  X-ray 07/23/2013 FINDINGS: No consolidation, pneumothorax or effusion. No edema. Normal cardiopericardial silhouette. IMPRESSION: No acute cardiopulmonary disease. Electronically Signed   By: Ranell Bring M.D.   On: 11/11/2023 11:02     EKG: EKG Interpretation Date/Time:  Friday November 11 2023 10:24:39 EDT Ventricular Rate:  55 PR Interval:  150 QRS Duration:  74 QT Interval:  438 QTC Calculation: 419 R Axis:   -20  Text Interpretation: Sinus bradycardia Otherwise normal ECG When compared with ECG of 08-Dec-2021 09:09, PREVIOUS ECG IS PRESENT Confirmed by Ula Barter [phone]) on 11/11/2023 10:29:43 AM  Radiology: [facility] Chest 1 View Result Date: 11/11/2023 CLINICAL DATA:  Chest pain EXAM: CHEST  1 VIEW COMPARISON:  X-ray 07/23/2013 FINDINGS: No consolidation, pneumothorax or effusion. No edema. Normal cardiopericardial silhouette. IMPRESSION: No acute cardiopulmonary disease. Electronically Signed   By: Ranell Bring M.D.   On: 11/11/2023 11:02     Procedures   Medications Ordered in the ED  famotidine (PEPCID) tablet 20 mg (has no administration in time range)  alum & mag hydroxide-simeth (MAALOX/MYLANTA) 200-200-20 MG/5ML suspension 30 mL (has no administration in time range)  acetaminophen  (TYLENOL ) tablet 650 mg (has no administration in time range)                                     Medical Decision Making Problems Addressed: Elevated blood pressure reading: acute illness or injury Essential hypertension: chronic illness or injury with exacerbation, progression, or side effects of treatment that poses a threat to life or bodily functions Precordial chest pain: acute illness or injury with systemic symptoms that poses a threat to life or bodily functions  Amount and/or Complexity of Data Reviewed External Data Reviewed: notes. Labs: ordered. Decision-making details documented in ED Course. Radiology: ordered and independent interpretation performed. Decision-making details documented in ED Course. ECG/medicine tests: ordered and independent interpretation performed. Decision-making details documented in ED Course.  Risk OTC drugs. Decision regarding hospitalization.   Iv ns. Continuous pulse ox and cardiac monitoring. Labs ordered/sent. Imaging ordered.   Differential diagnosis includes acs, msck cp, gi cp, etc. Dispo decision including potential need for admission considered - will get labs and imaging and reassess.   Reviewed nursing notes and prior charts for additional history. External reports reviewed.   Acetaminophen  po, pepcid po.   Cardiac monitor: sinus rhythm, rate 66.  Labs reviewed/interpreted by me - wbc and hgb normal. Chem unremarkable. Trop normal.   Additional labs reviewed/interpreted by me - delta trop normal and not significantly increased - after symptoms present/constant for past day, felt not c/w acs.   Xrays reviewed/interpreted by me - no pna.   Po fluids/food, ambulate in hall.  Recheck no chest pain or sob. Pt currently appears stable for ED d/c.   Rec close pcp/cardiology f/u, cardiology referral provided.   Return precautions provided.       Final diagnoses:  None    ED Discharge Orders     None  Prerana Strayer, MD 11/11/23 [phone]

## 2023-11-11 NOTE — Telephone Encounter (Addendum)
 Copied from CRM [id number]. Topic: Clinical - Red Word Triage >> Nov 11, 2023  9:05 AM Powell HERO wrote: Red Word that prompted transfer to Nurse Triage: Soreness around her chest, started yesterday. Concerned about her heart. Reason for Disposition  SEVERE chest pain  Answer Assessment - Initial Assessment Questions 1. LOCATION: Where does it hurt?       I'm having pain around my throat.   It starts in my chest and goes up into my throat.  It's all in the front of my chest that started yesterday. 2. RADIATION: Does the pain go anywhere else? (e.g., into neck, jaw, arms, back)     No  3. ONSET: When did the chest pain begin? (Minutes, hours or days)      Yesterday.   I took a water pill and the pain went away but it's come back.   I went to sleep thinking it would go away but it did not.  [facility].   She could not finish telling me anything in regards to this.    4. PATTERN: Does the pain come and go, or has it been constant since it started?  Does it get worse with exertion?      It's constant.  5. DURATION: How long does it last (e.g., seconds, minutes, hours)     I'm having the chest pain now. 6. SEVERITY: How bad is the pain?  (e.g., Scale 1-10; mild, moderate, or severe)    - MILD (1-3): doesn't interfere with normal activities     - MODERATE (4-7): interferes with normal activities or awakens from sleep    - SEVERE (8-10): excruciating pain, unable to do any normal activities       I'm taking the Repatha  shot every 2 weeks.  I redirected her to the pain scale 5/10.     I referred her to the ED at this point. I've been sweating.   But it's been hot weather.   7. CARDIAC RISK FACTORS: Do you have any history of heart problems or risk factors for heart disease? (e.g., angina, prior heart attack; diabetes, high blood pressure, high cholesterol, smoker, or strong family history of heart disease)     She was not able to tell me.   8. PULMONARY RISK  FACTORS: Do you have any history of lung disease?  (e.g., blood clots in lung, asthma, emphysema, birth control pills)     Not asked 9. CAUSE: What do you think is causing the chest pain?     I don't know.   She was unable on several occasions to answer my questions.   She would drift off onto another subject.  I would have to redirect her back to my questions. 10. OTHER SYMPTOMS: Do you have any other symptoms? (e.g., dizziness, nausea, vomiting, sweating, fever, difficulty breathing, cough)       Denied being dizzy but c/o sweating.   She was not able to maintain her focus on our conversation.   I referred her to the ED.   She started talking about having to call someone to let them know she was going to the ED.   I don't have any family locally.   I let her know that was important but it was more important that she get to the ED.  Then she started talking about her medication.   I had to redirect her again to go to the ED.   She mentioned she would go to  [facility].    Then she started talking about calling someone first and then about her medication.  I again let her know how important it was to go on to the ED.   She said,  Ok and hung up.   11. PREGNANCY: Is there any chance you are pregnant? When was your last menstrual period?       Not asked due to age  Protocols used: Chest Pain-A-AH FYI Only or Action Required?: Action required by provider: Referred to ED but not sure she is going to go.    Having chest pain since yesterday.  Patient was last seen in primary care on 09/14/2023 by Geofm Glade PARAS, MD. Called Nurse Triage reporting Chest Pain. Symptoms began yesterday. Interventions attempted: Nothing. Symptoms are: gradually worsening.  Triage Disposition: Go to ED Now (Notify PCP)  Patient/caregiver understands and will follow disposition?: Unsure Not sure this pt is going to go to the ED.   I had a hard time keeping her focused on our conversation.  She kept jumping to  different subjects and having to be redirected to the chest pain.    I called into the practice.  I let them know I wasn't sure the pt would go to the ED.   They got my notes and are getting it to the clinical team.

## 2023-11-11 NOTE — Discharge Instructions (Addendum)
 It was our pleasure to provide your ER care today - we hope that you feel better.  If GI/reflux symptoms, try taking pepcid and maalox as need for symptom relief.   For recent chest pain, follow up closely with cardiologist in the next 1-2 weeks - we made referral, and they should be contacting you with an appointment in the next few days. Also follow up regarding your blood pressure  that is high today.  Return to ER right away if worse, new symptoms, fevers, recurrent/persistent chest pain, increased trouble breathing, or other concern.

## 2023-11-11 NOTE — ED Provider Triage Note (Signed)
 Emergency Medicine Provider Triage Evaluation Note  Karen Evans , a 73 y.o. female  was evaluated in triage.  Pt complains of 73 year old female presenting to the emergency department today with chest discomfort.  Patient states that this is in the middle of her chest does not radiate.  Improved with Tylenol  last night.  She came to the ER today for further evaluation because it came back this morning.  Reports that the pain is minimal.  Did some exercises this morning that did not worsen the pain.  She denies a history of DVT or pulmonary embolism, recent surgeries, recent travel.  Pain is nonpleuritic.SABRA  Review of Systems  Positive: Chest pain Negative: Short of breath  Physical Exam  BP (!) 183/61 (BP Location: Right Arm)   Pulse (!) 58   Temp 98.4 F (36.9 C)   Resp 18   Ht 5' 3 (1.6 m)   Wt 68 kg   SpO2 98%   BMI 26.57 kg/m  Gen:   Awake, no distress   Resp:  Normal effort  MSK:   Moves extremities without difficulty  Other:    Medical Decision Making  Medically screening exam initiated at 10:36 AM.  Appropriate orders placed.  Devoiry P Huhn was informed that the remainder of the evaluation will be completed by another provider, this initial triage assessment does not replace that evaluation, and the importance of remaining in the ED until their evaluation is complete.     Ula Prentice SAUNDERS, MD 11/11/23 1037

## 2023-11-11 NOTE — Telephone Encounter (Signed)
 Copied from CRM [id number]. Topic: Clinical - Medical Advice >> Nov 11, 2023  9:06 AM Powell HERO wrote: Reason for CRM: Patient would like to know if she is needing to get a pneumonia vaccination. Please advise.   Patient is not a patient at our office. YL,RMA

## 2023-11-11 NOTE — ED Notes (Signed)
 CCMD called.

## 2023-11-14 ENCOUNTER — Other Ambulatory Visit: Payer: Self-pay | Admitting: Internal Medicine

## 2023-11-24 ENCOUNTER — Ambulatory Visit: Admitting: Physician Assistant

## 2023-11-24 NOTE — Progress Notes (Signed)
 Ellouise Console, PA-C [address] [place], [place]  [address] Phone: [phone]   Primary Care Physician: Geofm Glade PARAS, MD  Primary Gastroenterologist:  Ellouise Console, PA-C / Dr. Gordy Starch   Chief Complaint:  ED F/U Chest Pain, GERD   HPI:   Karen Evans is a 73 y.o. female, established patient Dr. Starch, presents for ED f/u chest pain and  GERD.  Went to [facility] ED 11/11/23 to evaluate 2 day history of Chest Pain.  CXR and Labs were normal.  ECG was normal..  She was started on Pepcid  20mg  BID and Maalox / Mylanta GI cocktail with great benefit.  Prior to ED visit she was not on PPI or H2RB.  She is not having any more chest pain today.  Denies any other GI symptoms or concerns.  Denies dysphagia.  Last saw Jessica Zehr, PA-C 06/2022 for follow-up of GERD.  She took Pepcid  in the past which helped, however she does not like taking daily medication.  Colonoscopy August 2023 with nodular mucosa seen in the distal rectum.  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  10 year repeat, pending Health (optional due to age).   EGD in September 2013 by Dr. Jakie: sSmall hiatal hernia, otherwise normal.  Current Outpatient Medications  Medication Sig Dispense Refill   acetaminophen  (TYLENOL ) 500 MG tablet Take 250-500 mg by mouth every 6 (six) hours as needed for headache (pain).     aspirin  EC 81 MG tablet Take 1 tablet (81 mg total) by mouth daily. Swallow whole. 30 tablet 12   atenolol  (TENORMIN ) 25 MG tablet TAKE 1 TABLET BY MOUTH DAILY 100 tablet 2   betamethasone  valerate ointment (VALISONE ) 0.1 % Apply a pea sized amount BID for up to 2 weeks as needed 30 g 0   Calcium  Carbonate-Vitamin D  (CALTRATE 600+D PO) Take 1 tablet by mouth daily.      cholecalciferol (VITAMIN D ) 1000 units tablet Take 1,000 Units by mouth daily.     Evolocumab  (REPATHA  SURECLICK) 140 MG/ML SOAJ Inject 140 mg into the skin every 14 (fourteen) days. 2 mL 11   famotidine  (PEPCID ) 20 MG tablet  Take 1 tablet (20 mg total) by mouth 2 (two) times daily. 180 tablet 3   fexofenadine (ALLEGRA) 180 MG tablet Take 180 mg by mouth daily.     Fluticasone  Propionate (FLONASE  NA) Place 1 spray into the nose daily as needed (congestion).     hydrocortisone  (ANUSOL -HC) 25 MG suppository Place 25 mg rectally as needed for hemorrhoids or anal itching.     ketorolac  (ACULAR ) 0.5 % ophthalmic solution Place 1 drop into the left eye 4 (four) times daily.     Lactase (LACTAID PO) Take 1 tablet by mouth 2 (two) times daily as needed (stomach upset/ gas).      Multiple Vitamin (MULTIVITAMIN WITH MINERALS) TABS tablet Take 1 tablet by mouth daily.     olopatadine (PATANOL) 0.1 % ophthalmic solution Place 1 drop into both eyes 1 day or 1 dose. Dr. Milissa     Propylene Glycol (SYSTANE COMPLETE OP) Place 1 drop into both eyes at bedtime.     telmisartan  (MICARDIS ) 80 MG tablet TAKE 1 TABLET BY MOUTH DAILY 100 tablet 2   No current facility-administered medications for this visit.    Allergies as of 11/25/2023 - Review Complete 11/25/2023  Allergen Reaction Noted   Cefprozil Hives    Erythromycin Hives 11/13/2007   Esomeprazole magnesium Hives  Penicillins Hives, Rash, and Other (See Comments) 11/13/2007   Sulfonamide derivatives Hives 11/13/2007   Amlodipine  Other (See Comments) 03/17/2016   Crestor  [rosuvastatin  calcium ] Itching 01/12/2017   Crestor  [rosuvastatin ] Itching and Other (See Comments) 10/06/2018   Erythromycin base Hives 03/20/2018   Hydrochlorothiazide  Other (See Comments) 02/26/2016   Other Other (See Comments) 06/14/2023   Pravastatin  Other (See Comments) 01/21/2017   Sulfa antibiotics Hives and Other (See Comments) 03/20/2018   Atorvastatin  Other (See Comments) 12/08/2016   Doxycycline  Nausea And Vomiting 01/15/2011   Zetia  [ezetimibe ] Rash 10/31/2019    Past Medical History:  Diagnosis Date   Allergy    Anemia    PMH of    Colon polyp    Crohn's    ileitis/mild; Dr  Jakie   Duodenitis without mention of hemorrhage 2007   EGD   Fibroid, uterine    Dr Rutherford; G 0 P 0   GERD (gastroesophageal reflux disease)    Goiter    CTS,RUE > LUE   Heart murmur    Hiatal hernia 2002   EGD   Hypertension    Internal hemorrhoids    Migraines    when takes vitamins    Rectal polyp 01/24/2012   TUBULAR ADENOMA   Reflux esophagitis 2007   EGD   Stricture and stenosis of esophagus 2007   EGD   Tubular adenoma of colon    Ulcer    Vitamin D  deficiency     Past Surgical History:  Procedure Laterality Date   CATARACT EXTRACTION Bilateral    2024   COLONOSCOPY     polyps;granular ileitis 2006;2007 normal, Dr Jakie   COLONOSCOPY WITH PROPOFOL   07/2016   Dr.Pyrtle   HEMORRHOID SURGERY     OPERATIVE HYSTEROSCOPY  08/05/2004   Dr Rutherford   OVARIAN CYST REMOVAL     POLYPECTOMY     UPPER GASTROINTESTINAL ENDOSCOPY      Review of Systems:    All systems reviewed and negative except where noted in HPI.    Physical Exam:  BP 130/78 (BP Location: Left Arm, Patient Position: Sitting, Cuff Size: Normal)   Pulse 60 Comment: irregular  Ht 5' 1.75 (1.568 m) Comment: height measured without shoes  Wt 147 lb 8 oz (66.9 kg)   BMI 27.20 kg/m  No LMP recorded. Patient is postmenopausal.  General: Well-nourished, well-developed in no acute distress.  Lungs: Clear to auscultation bilaterally. Non-labored. Heart: Regular rate and rhythm, no murmurs rubs or gallops.  Abdomen: Bowel sounds are normal; Abdomen is Soft; No hepatosplenomegaly, masses or hernias;  No Abdominal Tenderness; No guarding or rebound tenderness. Neuro: Alert and oriented x 3.  Grossly intact.  Psych: Alert and cooperative, normal mood and affect.   Imaging Studies: DG Chest 1 View Result Date: 11/11/2023 CLINICAL DATA:  Chest pain EXAM: CHEST  1 VIEW COMPARISON:  X-ray 07/23/2013 FINDINGS: No consolidation, pneumothorax or effusion. No edema. Normal cardiopericardial silhouette.  IMPRESSION: No acute cardiopulmonary disease. Electronically Signed   By: Ranell Bring M.D.   On: 11/11/2023 11:02    Labs: CBC    Component Value Date/Time   WBC 6.9 11/11/2023 1037   RBC 4.88 11/11/2023 1037   HGB 13.0 11/11/2023 1037   HCT 41.3 11/11/2023 1037   PLT 239 11/11/2023 1037   MCV 84.6 11/11/2023 1037   MCH 26.6 11/11/2023 1037   MCHC 31.5 11/11/2023 1037   RDW 13.6 11/11/2023 1037   LYMPHSABS 2.2 12/14/2022 1555   MONOABS 1.0 12/14/2022 1555  EOSABS 0.1 12/14/2022 1555   BASOSABS 0.1 12/14/2022 1555    CMP     Component Value Date/Time   NA 138 11/11/2023 1037   K 3.8 11/11/2023 1037   CL 107 11/11/2023 1037   CO2 22 11/11/2023 1037   GLUCOSE 103 (H) 11/11/2023 1037   BUN 10 11/11/2023 1037   CREATININE 0.61 11/11/2023 1037   CREATININE 0.71 02/08/2020 1343   CALCIUM  9.4 11/11/2023 1037   PROT 7.7 06/07/2023 1446   ALBUMIN 4.2 06/07/2023 1446   AST 20 06/07/2023 1446   ALT 16 06/07/2023 1446   ALKPHOS 77 06/07/2023 1446   BILITOT 0.5 06/07/2023 1446   GFRNONAA >60 11/11/2023 1037   GFRAA 96 04/25/2008 1538    Assessment and Plan:   Karen Evans is a 73 y.o. y/o female presents for ED follow-up of  Chest pain attributed to gastroesophageal reflux disease - Rx Pepcid  20mg  1 tablet twice daily, #180, 3 refills. -Encouraged her to go ahead and take Pepcid  once or twice daily every day. -Discussed etiology and treatment for acid reflux at length.  She has no alarm symptoms. - Recommend Lifestyle Modifications to prevent Acid Reflux.  Rec. Avoid coffee, sodas, peppermint, garlic, onions, alcohol, citrus fruits, chocolate, tomatoes, fatty and spicey foods.  Avoid eating 2-3 hours before bedtime.    Ellouise Console, PA-C  Follow up if symptoms worsen or fail to improve.

## 2023-11-25 ENCOUNTER — Ambulatory Visit: Admitting: Physician Assistant

## 2023-11-25 ENCOUNTER — Encounter: Payer: Self-pay | Admitting: Physician Assistant

## 2023-11-25 VITALS — BP 130/78 | HR 60 | Ht 61.75 in | Wt 147.5 lb

## 2023-11-25 DIAGNOSIS — K219 Gastro-esophageal reflux disease without esophagitis: Secondary | ICD-10-CM | POA: Diagnosis not present

## 2023-11-25 MED ORDER — FAMOTIDINE 20 MG PO TABS: 20.0000 mg | ORAL_TABLET | Freq: Two times a day (BID) | ORAL | 3 refills | Status: AC

## 2023-11-25 NOTE — Patient Instructions (Signed)
 We have sent the following medications to your pharmacy for you to pick up at your convenience: Famotidine  20 mg twice daily  Please follow up sooner if symptoms increase or worsen  Due to recent changes in healthcare laws, you may see the results of your imaging and laboratory studies on MyChart before your provider has had a chance to review them.  We understand that in some cases there may be results that are confusing or concerning to you. Not all laboratory results come back in the same time frame and the provider may be waiting for multiple results in order to interpret others.  Please give us  48 hours in order for your provider to thoroughly review all the results before contacting the office for clarification of your results.   Thank you for trusting me with your gastrointestinal care!   Ellouise Console, PA-C _______________________________________________________  If your blood pressure at your visit was 140/90 or greater, please contact your primary care physician to follow up on this.  _______________________________________________________  If you are age 71 or older, your body mass index should be between 23-30. Your Body mass index is 27.2 kg/m. If this is out of the aforementioned range listed, please consider follow up with your Primary Care Provider.  If you are age 54 or younger, your body mass index should be between 19-25. Your Body mass index is 27.2 kg/m. If this is out of the aformentioned range listed, please consider follow up with your Primary Care Provider.   ________________________________________________________  The [facility] providers would like to encourage you to use MYCHART to communicate with providers for non-urgent requests or questions.  Due to long hold times on the telephone, sending your provider a message by [facility] may be a faster and more efficient way to get a response.  Please allow 48 business hours for a response.  Please remember that this is for  non-urgent requests.  _______________________________________________________

## 2023-11-28 ENCOUNTER — Encounter: Payer: Self-pay | Admitting: Obstetrics and Gynecology

## 2023-11-28 ENCOUNTER — Ambulatory Visit (INDEPENDENT_AMBULATORY_CARE_PROVIDER_SITE_OTHER): Payer: Medicare Other | Admitting: Obstetrics and Gynecology

## 2023-11-28 VITALS — BP 130/84 | HR 64 | Ht 62.5 in | Wt 149.0 lb

## 2023-11-28 DIAGNOSIS — Z01419 Encounter for gynecological examination (general) (routine) without abnormal findings: Secondary | ICD-10-CM

## 2023-11-28 DIAGNOSIS — Z9189 Other specified personal risk factors, not elsewhere classified: Secondary | ICD-10-CM | POA: Diagnosis not present

## 2023-11-28 DIAGNOSIS — M8580 Other specified disorders of bone density and structure, unspecified site: Secondary | ICD-10-CM | POA: Diagnosis not present

## 2023-11-28 DIAGNOSIS — L723 Sebaceous cyst: Secondary | ICD-10-CM

## 2023-11-28 NOTE — Progress Notes (Signed)
 73 y.o. G0P0000 Single African American female here for a breast and pelvic exam.    She states she has a lump on her vagina.  She uses Valisone  ointment as needed.   Denies vaginal bleeding.    Not sexually active.    Regular bowel movements.    Had dx left mammogram done for breast pain.  Normal imaging.  She thinks it was her underwire bra.   Retired from [employer] in 2019.  PCP: Geofm Glade PARAS, MD   No LMP recorded. Patient is postmenopausal.           Sexually active: No.  The current method of family planning is post menopausal status.    Menopausal hormone therapy:  n/a Exercising: Yes.    [employer], biking, walking Smoker:  no  OB History     Gravida  0   Para  0   Term  0   Preterm  0   AB  0   Living  0      SAB  0   IAB  0   Ectopic  0   Multiple  0   Live Births  0           HEALTH MAINTENANCE: Last 2 paps: 11/25/21 neg  History of abnormal Pap or positive HPV:  no Mammogram:  dx left mammogram 10/11/23 Breast density Cat B, BIRADS cat 1 neg.  Due in Oct.  2025.   Colonoscopy:  01/13/22   Bone Density:  10/20/21  Result  osteopenia of hips.  PCP following at [facility].    Immunization History  Administered Date(s) Administered   Influenza,inj,Quad PF,6+ Mos 04/06/2013   Influenza-Unspecified 02/22/2017   Moderna Covid-19 Fall Seasonal Vaccine 23yrs & older 06/14/2022   Moderna SARS-COV2 Booster Vaccination 03/05/2021   PFIZER(Purple Top)SARS-COV-2 Vaccination 07/09/2019, 07/30/2019   Pfizer Covid-19 Vaccine Bivalent Booster 56yrs & up 02/12/2020   Pneumococcal Conjugate-13 03/23/2018   Pneumococcal Polysaccharide-23 08/22/2019   Td 12/29/2007   Tdap 02/15/2021   Zoster Recombinant(Shingrix) 06/24/2020, 09/04/2020   Zoster, Live 04/02/2013      reports that she has never smoked. She has never been exposed to tobacco smoke. She has never used smokeless tobacco. She reports that she does not drink alcohol and does not use  drugs.  Past Medical History:  Diagnosis Date   Allergy    Anemia    PMH of    Colon polyp    Crohn's    ileitis/mild; Dr Jakie   Duodenitis without mention of hemorrhage 2007   EGD   Fibroid, uterine    Dr Rutherford; G 0 P 0   GERD (gastroesophageal reflux disease)    Goiter    CTS,RUE > LUE   Heart murmur    Hiatal hernia 2002   EGD   Hypertension    Internal hemorrhoids    Migraines    when takes vitamins    Rectal polyp 01/24/2012   TUBULAR ADENOMA   Reflux esophagitis 2007   EGD   Stricture and stenosis of esophagus 2007   EGD   Tubular adenoma of colon    Ulcer    Vitamin D  deficiency     Past Surgical History:  Procedure Laterality Date   CATARACT EXTRACTION Bilateral    2024   COLONOSCOPY     polyps;granular ileitis 2006;2007 normal, Dr Jakie   COLONOSCOPY WITH PROPOFOL   07/2016   Dr.Pyrtle   HEMORRHOID SURGERY     OPERATIVE HYSTEROSCOPY  08/05/2004  Dr Rutherford   OVARIAN CYST REMOVAL     POLYPECTOMY     UPPER GASTROINTESTINAL ENDOSCOPY      Current Outpatient Medications  Medication Sig Dispense Refill   acetaminophen  (TYLENOL ) 500 MG tablet Take 250-500 mg by mouth every 6 (six) hours as needed for headache (pain).     aspirin  EC 81 MG tablet Take 1 tablet (81 mg total) by mouth daily. Swallow whole. 30 tablet 12   atenolol  (TENORMIN ) 25 MG tablet TAKE 1 TABLET BY MOUTH DAILY 100 tablet 2   betamethasone  valerate ointment (VALISONE ) 0.1 % Apply a pea sized amount BID for up to 2 weeks as needed 30 g 0   Calcium  Carbonate-Vitamin D  (CALTRATE 600+D PO) Take 1 tablet by mouth daily.      cholecalciferol (VITAMIN D ) 1000 units tablet Take 1,000 Units by mouth daily.     Evolocumab  (REPATHA  SURECLICK) 140 MG/ML SOAJ Inject 140 mg into the skin every 14 (fourteen) days. 2 mL 11   famotidine  (PEPCID ) 20 MG tablet Take 1 tablet (20 mg total) by mouth 2 (two) times daily. 180 tablet 3   fexofenadine (ALLEGRA) 180 MG tablet Take 180 mg by mouth  daily.     Fluticasone  Propionate (FLONASE  NA) Place 1 spray into the nose daily as needed (congestion).     hydrocortisone  (ANUSOL -HC) 25 MG suppository Place 25 mg rectally as needed for hemorrhoids or anal itching.     ketorolac  (ACULAR ) 0.5 % ophthalmic solution Place 1 drop into the left eye 4 (four) times daily.     Lactase (LACTAID PO) Take 1 tablet by mouth 2 (two) times daily as needed (stomach upset/ gas).      Multiple Vitamin (MULTIVITAMIN WITH MINERALS) TABS tablet Take 1 tablet by mouth daily.     olopatadine (PATANOL) 0.1 % ophthalmic solution Place 1 drop into both eyes 1 day or 1 dose. Dr. Milissa     Propylene Glycol (SYSTANE COMPLETE OP) Place 1 drop into both eyes at bedtime.     telmisartan  (MICARDIS ) 80 MG tablet TAKE 1 TABLET BY MOUTH DAILY 100 tablet 2   No current facility-administered medications for this visit.    ALLERGIES: Cefprozil, Erythromycin, Esomeprazole magnesium, Penicillins, Sulfonamide derivatives, Amlodipine , Crestor  [rosuvastatin  calcium ], Crestor  [rosuvastatin ], Erythromycin base, Hydrochlorothiazide , Other, Pravastatin , Sulfa antibiotics, Atorvastatin , Doxycycline , and Zetia  [ezetimibe ]  Family History  Problem Relation Age of Onset   Diabetes Mother    Hypertension Mother    Heart disease Mother    Lymphoma Father    Diabetes Brother        CAD; Dialysis   Hypertension Brother    Colon cancer Maternal Grandmother    Rectal cancer Paternal Grandmother    Penile cancer Paternal Grandfather    Heart attack Other        1/2 maternal aunts X 2   Cancer Other    COPD Neg Hx    Esophageal cancer Neg Hx    Stomach cancer Neg Hx    Stroke Neg Hx    Colon polyps Neg Hx     Review of Systems  Genitourinary:        Vaginal lump.    PHYSICAL EXAM:  BP 130/84 (BP Location: Left Arm, Patient Position: Sitting)   Pulse 64   Ht 5' 2.5 (1.588 m)   Wt 149 lb (67.6 kg)   SpO2 97%   BMI 26.82 kg/m     General appearance: alert, cooperative and  appears stated age Head: normocephalic, without obvious  abnormality, atraumatic Neck: no adenopathy, supple, symmetrical, trachea midline and thyroid  normal to inspection and palpation Lungs: clear to auscultation bilaterally Breasts: normal appearance, no masses or tenderness, No nipple retraction or dimpling, No nipple discharge or bleeding, No axillary adenopathy Heart: regular rate and rhythm Abdomen: soft, non-tender; no masses, no organomegaly Extremities: extremities normal, atraumatic, no cyanosis or edema Skin: skin color, texture, turgor normal. No rashes or lesions.  Alopecia of scalp.  Lymph nodes: cervical, supraclavicular, and axillary nodes normal. Neurologic: grossly normal  Pelvic: External genitalia:  3 - 4 mm sebaceous cyst of right labia majora.               No abnormal inguinal nodes palpated.              Urethra:  normal appearing urethra with no masses, tenderness or lesions              Bartholins and Skenes: normal                 Vagina: normal appearing vagina with normal color and discharge, no lesions.  Atrophy noted.               Cervix: no lesions              Pap taken: no.  Bimanual Exam:  Uterus:  normal size, contour, position, consistency, mobility, non-tender              Adnexa: no mass, fullness, tenderness              Rectal exam: yes.  Confirms.              Anus:  normal sphincter tone, no lesions  Chaperone was present for exam:  Kari HERO, CMA  ASSESSMENT: Encounter for breast and pelvic exam.  Other specified personal risk factors. Sebaceous cyst right labia majora.   Osteopenia of bilateral hips.  Followed by PCP.   PLAN: Mammogram screening discussed.  She understands she is due in October 2025.  Self breast awareness reviewed. Pap and HRV collected:  no.  Will do next year. Guidelines for Calcium , Vitamin D , regular exercise program including cardiovascular and weight bearing exercise. Medication refills:  None. Education given  about sebaceous cyst.  I do not recommend removal.  She was educated that the clobetasol will treat itching and inflammation but will not remove the cyst of the skin.  Labs with PCP.  Follow up:  yearly and prn.    10 min  total time was spent for this patient encounter, including preparation, face-to-face counseling with the patient, coordination of care, and documentation of the encounter in addition to doing the breast and pelvic exam.

## 2023-11-28 NOTE — Patient Instructions (Addendum)
 Pocket of Fluid in the Skin (Epidermoid Cyst): What to Know  An epidermoid cyst is a small lump under your skin. The cyst contains a substance that is thick and oily. What are the causes? A blocked hair follicle. A hair curls and re-enters the skin instead of growing straight out of the skin. A blocked pore. Irritated skin. An injury to the skin. Certain conditions that are passed from parent to child. Human papillomavirus (HPV). This happens rarely when cysts occur on the bottom of the feet. Long-term sun damage to the skin. What increases the risk? Having acne. Being female. Having an injury to the skin. Being past puberty. Certain conditions that are passed down through family (genetic disorder). What are the signs or symptoms? The only sign of this type of cyst may be a small, painless lump under the skin. These cysts are usually painless, but they can get infected. Symptoms of infection may include: Redness. Inflammation. Tenderness. Warmth. Fever. A bad-smelling substance that drains from the cyst. Pus that drains from the cyst. How is this treated? If a cyst becomes inflamed, treatment may include: Opening and draining the cyst. Antibiotics. Shots of medicines called steroids that help lessen inflammation. Surgery to remove the cyst if it's large, painful, or could turn into cancer. Do not try to open or squeeze a cyst yourself. Follow these instructions at home: Medicines Take your medicines only as told. If you were given antibiotics, take them as told. Do not stop taking them even if you start to feel better. General instructions Keep the area around your cyst clean and dry. Wear loose, dry clothing. Avoid touching your cyst. Check your cyst every day for signs of infection. Check for: Redness, swelling, or pain. Fluid or blood. Warmth. Pus or a bad smell. Keep all follow-up visits to make sure there's no discomfort or infection. Contact a doctor if: You have  any signs of infection. Your cyst doesn't get better or gets worse. You get a cyst that looks different from other cysts you've had. You have a fever. You have redness that spreads from the cyst. This information is not intended to replace advice given to you by your health care provider. Make sure you discuss any questions you have with your health care provider. Document Revised: 12/17/2022 Document Reviewed: 12/17/2022 Elsevier Patient Education  2024 Elsevier Inc.  EXERCISE AND DIET:  We recommended that you start or continue a regular exercise program for good health. Regular exercise means any activity that makes your heart beat faster and makes you sweat.  We recommend exercising at least 30 minutes per day at least 3 days a week, preferably 4 or 5.  We also recommend a diet low in fat and sugar.  Inactivity, poor dietary choices and obesity can cause diabetes, heart attack, stroke, and kidney damage, among others.    ALCOHOL AND SMOKING:  Women should limit their alcohol intake to no more than 7 drinks/beers/glasses of wine (combined, not each!) per week. Moderation of alcohol intake to this level decreases your risk of breast cancer and liver damage. And of course, no recreational drugs are part of a healthy lifestyle.  And absolutely no smoking or even second hand smoke. Most people know smoking can cause heart and lung diseases, but did you know it also contributes to weakening of your bones? Aging of your skin?  Yellowing of your teeth and nails?  CALCIUM  AND VITAMIN D :  Adequate intake of calcium  and Vitamin D  are recommended.  The  recommendations for exact amounts of these supplements seem to change often, but generally speaking 600 mg of calcium  (either carbonate or citrate) and 800 units of Vitamin D  per day seems prudent. Certain women may benefit from higher intake of Vitamin D .  If you are among these women, your doctor will have told you during your visit.    PAP SMEARS:  Pap  smears, to check for cervical cancer or precancers,  have traditionally been done yearly, although recent scientific advances have shown that most women can have pap smears less often.  However, every woman still should have a physical exam from her gynecologist every year. It will include a breast check, inspection of the vulva and vagina to check for abnormal growths or skin changes, a visual exam of the cervix, and then an exam to evaluate the size and shape of the uterus and ovaries.  And after 71 years of age, a rectal exam is indicated to check for rectal cancers. We will also provide age appropriate advice regarding health maintenance, like when you should have certain vaccines, screening for sexually transmitted diseases, bone density testing, colonoscopy, mammograms, etc.   MAMMOGRAMS:  All women over 20 years old should have a yearly mammogram. Many facilities now offer a 3D mammogram, which may cost around $50 extra out of pocket. If possible,  we recommend you accept the option to have the 3D mammogram performed.  It both reduces the number of women who will be called back for extra views which then turn out to be normal, and it is better than the routine mammogram at detecting truly abnormal areas.    COLONOSCOPY:  Colonoscopy to screen for colon cancer is recommended for all women at age 74.  We know, you hate the idea of the prep.  We agree, BUT, having colon cancer and not knowing it is worse!!  Colon cancer so often starts as a polyp that can be seen and removed at colonscopy, which can quite literally save your life!  And if your first colonoscopy is normal and you have no family history of colon cancer, most women don't have to have it again for 10 years.  Once every ten years, you can do something that may end up saving your life, right?  We will be happy to help you get it scheduled when you are ready.  Be sure to check your insurance coverage so you understand how much it will cost.  It  may be covered as a preventative service at no cost, but you should check your particular policy.

## 2023-12-01 ENCOUNTER — Ambulatory Visit: Payer: Medicare Other | Admitting: Physician Assistant

## 2023-12-05 NOTE — Patient Instructions (Addendum)
 Blood work was ordered.       Medications changes include :   None     Return in about 6 months (around 06/07/2024) for follow up.   Health Maintenance, Female Adopting a healthy lifestyle and getting preventive care are important in promoting health and wellness. Ask your health care provider about: The right schedule for you to have regular tests and exams. Things you can do on your own to prevent diseases and keep yourself healthy. What should I know about diet, weight, and exercise? Eat a healthy diet  Eat a diet that includes plenty of vegetables, fruits, low-fat dairy products, and lean protein. Do not eat a lot of foods that are high in solid fats, added sugars, or sodium. Maintain a healthy weight Body mass index (BMI) is used to identify weight problems. It estimates body fat based on height and weight. Your health care provider can help determine your BMI and help you achieve or maintain a healthy weight. Get regular exercise Get regular exercise. This is one of the most important things you can do for your health. Most adults should: Exercise for at least 150 minutes each week. The exercise should increase your heart rate and make you sweat (moderate-intensity exercise). Do strengthening exercises at least twice a week. This is in addition to the moderate-intensity exercise. Spend less time sitting. Even light physical activity can be beneficial. Watch cholesterol and blood lipids Have your blood tested for lipids and cholesterol at 71 years of age, then have this test every 5 years. Have your cholesterol levels checked more often if: Your lipid or cholesterol levels are high. You are older than 71 years of age. You are at high risk for heart disease. What should I know about cancer screening? Depending on your health history and family history, you may need to have cancer screening at various ages. This may include screening for: Breast cancer. Cervical  cancer. Colorectal cancer. Skin cancer. Lung cancer. What should I know about heart disease, diabetes, and high blood pressure? Blood pressure and heart disease High blood pressure causes heart disease and increases the risk of stroke. This is more likely to develop in people who have high blood pressure readings or are overweight. Have your blood pressure checked: Every 3-5 years if you are 29-21 years of age. Every year if you are 50 years old or older. Diabetes Have regular diabetes screenings. This checks your fasting blood sugar level. Have the screening done: Once every three years after age 36 if you are at a normal weight and have a low risk for diabetes. More often and at a younger age if you are overweight or have a high risk for diabetes. What should I know about preventing infection? Hepatitis B If you have a higher risk for hepatitis B, you should be screened for this virus. Talk with your health care provider to find out if you are at risk for hepatitis B infection. Hepatitis C Testing is recommended for: Everyone born from [birth year] through 1963. Anyone with known risk factors for hepatitis C. Sexually transmitted infections (STIs) Get screened for STIs, including gonorrhea and chlamydia, if: You are sexually active and are younger than 71 years of age. You are older than 71 years of age and your health care provider tells you that you are at risk for this type of infection. Your sexual activity has changed since you were last screened, and you are at increased risk for chlamydia or gonorrhea.  Ask your health care provider if you are at risk. Ask your health care provider about whether you are at high risk for HIV. Your health care provider may recommend a prescription medicine to help prevent HIV infection. If you choose to take medicine to prevent HIV, you should first get tested for HIV. You should then be tested every 3 months for as long as you are taking the  medicine. Pregnancy If you are about to stop having your period (premenopausal) and you may become pregnant, seek counseling before you get pregnant. Take 400 to 800 micrograms (mcg) of folic acid every day if you become pregnant. Ask for birth control (contraception) if you want to prevent pregnancy. Osteoporosis and menopause Osteoporosis is a disease in which the bones lose minerals and strength with aging. This can result in bone fractures. If you are 32 years old or older, or if you are at risk for osteoporosis and fractures, ask your health care provider if you should: Be screened for bone loss. Take a calcium  or vitamin D  supplement to lower your risk of fractures. Be given hormone replacement therapy (HRT) to treat symptoms of menopause. Follow these instructions at home: Alcohol use Do not drink alcohol if: Your health care provider tells you not to drink. You are pregnant, may be pregnant, or are planning to become pregnant. If you drink alcohol: Limit how much you have to: 0-1 drink a day. Know how much alcohol is in your drink. In the [place] one drink equals one 12 oz bottle of beer (355 mL), one 5 oz glass of wine (148 mL), or one 1 oz glass of hard liquor (44 mL). Lifestyle Do not use any products that contain nicotine or tobacco. These products include cigarettes, chewing tobacco, and vaping devices, such as e-cigarettes. If you need help quitting, ask your health care provider. Do not use street drugs. Do not share needles. Ask your health care provider for help if you need support or information about quitting drugs. General instructions Schedule regular health, dental, and eye exams. Stay current with your vaccines. Tell your health care provider if: You often feel depressed. You have ever been abused or do not feel safe at home. Summary Adopting a healthy lifestyle and getting preventive care are important in promoting health and wellness. Follow your health care  provider's instructions about healthy diet, exercising, and getting tested or screened for diseases. Follow your health care provider's instructions on monitoring your cholesterol and blood pressure. This information is not intended to replace advice given to you by your health care provider. Make sure you discuss any questions you have with your health care provider. Document Revised: 09/22/2020 Document Reviewed: 09/22/2020 Elsevier Patient Education  2024 [employer].

## 2023-12-05 NOTE — Progress Notes (Signed)
 Subjective:    Patient ID: Karen Evans, female    DOB: 07-01-1950, 73 y.o.   MRN: [id number]      HPI Karen Evans is here for a Physical exam and her chronic medical problems.   Was in ED 6/27.     Medications and allergies reviewed with patient and updated if appropriate.  Current Outpatient Medications on File Prior to Visit  Medication Sig Dispense Refill   acetaminophen  (TYLENOL ) 500 MG tablet Take 250-500 mg by mouth every 6 (six) hours as needed for headache (pain).     aspirin  EC 81 MG tablet Take 1 tablet (81 mg total) by mouth daily. Swallow whole. 30 tablet 12   atenolol  (TENORMIN ) 25 MG tablet TAKE 1 TABLET BY MOUTH DAILY 100 tablet 2   betamethasone  valerate ointment (VALISONE ) 0.1 % Apply a pea sized amount BID for up to 2 weeks as needed 30 g 0   Calcium  Carbonate-Vitamin D  (CALTRATE 600+D PO) Take 1 tablet by mouth daily.      cholecalciferol (VITAMIN D ) 1000 units tablet Take 1,000 Units by mouth daily.     Evolocumab  (REPATHA  SURECLICK) 140 MG/ML SOAJ Inject 140 mg into the skin every 14 (fourteen) days. 2 mL 11   famotidine  (PEPCID ) 20 MG tablet Take 1 tablet (20 mg total) by mouth 2 (two) times daily. 180 tablet 3   fexofenadine (ALLEGRA) 180 MG tablet Take 180 mg by mouth daily.     Fluticasone  Propionate (FLONASE  NA) Place 1 spray into the nose daily as needed (congestion).     hydrocortisone  (ANUSOL -HC) 25 MG suppository Place 25 mg rectally as needed for hemorrhoids or anal itching.     ketorolac  (ACULAR ) 0.5 % ophthalmic solution Place 1 drop into the left eye 4 (four) times daily.     Lactase (LACTAID PO) Take 1 tablet by mouth 2 (two) times daily as needed (stomach upset/ gas).      Multiple Vitamin (MULTIVITAMIN WITH MINERALS) TABS tablet Take 1 tablet by mouth daily.     olopatadine (PATANOL) 0.1 % ophthalmic solution Place 1 drop into both eyes 1 day or 1 dose. Dr. Milissa     Propylene Glycol (SYSTANE COMPLETE OP) Place 1 drop into both eyes at bedtime.      telmisartan  (MICARDIS ) 80 MG tablet TAKE 1 TABLET BY MOUTH DAILY 100 tablet 2   No current facility-administered medications on file prior to visit.    Review of Systems  Constitutional:  Negative for fever.  Eyes:  Negative for visual disturbance.  Respiratory:  Positive for shortness of breath (occ). Negative for cough and wheezing.   Cardiovascular:  Negative for chest pain (none since ED visit one month ago), palpitations and leg swelling.  Gastrointestinal:  Negative for abdominal pain, blood in stool, constipation and diarrhea.       No gerd  Genitourinary:  Negative for dysuria.  Musculoskeletal:  Positive for arthralgias (knees a little). Negative for back pain.  Skin:  Negative for rash.  Neurological:  Positive for light-headedness (occ) and headaches (occ).  Psychiatric/Behavioral:  Negative for dysphoric mood. The patient is not nervous/anxious.        Objective:   Vitals:   12/06/23 1447  BP: 130/80  Pulse: (!) 56  Temp: 98 F (36.7 C)  SpO2: 98%   Filed Weights   12/06/23 1447  Weight: 149 lb (67.6 kg)   Body mass index is 26.82 kg/m.  BP Readings from Last 3 Encounters:  12/06/23 130/80  11/28/23 130/84  11/25/23 130/78    Wt Readings from Last 3 Encounters:  12/06/23 149 lb (67.6 kg)  11/28/23 149 lb (67.6 kg)  11/25/23 147 lb 8 oz (66.9 kg)       Physical Exam Constitutional: She appears well-developed and well-nourished. No distress.  HENT:  Head: Normocephalic and atraumatic.  Right Ear: External ear normal. Normal ear canal and TM Left Ear: External ear normal.  Normal ear canal and TM Mouth/Throat: Oropharynx is clear and moist.  Eyes: Conjunctivae normal.  Neck: Neck supple. No tracheal deviation present. No thyromegaly present.  No carotid bruit  Cardiovascular: Normal rate, regular rhythm and normal heart sounds.   No murmur heard.  No edema. Pulmonary/Chest: Effort normal and breath sounds normal. No respiratory distress. She  has no wheezes. She has no rales.  Breast: deferred   Abdominal: Soft. She exhibits no distension. There is no tenderness.  Lymphadenopathy: She has no cervical adenopathy.  Skin: Skin is warm and dry. She is not diaphoretic.  Psychiatric: She has a normal mood and affect. Her behavior is normal.     Lab Results  Component Value Date   WBC 6.9 11/11/2023   HGB 13.0 11/11/2023   HCT 41.3 11/11/2023   PLT 239 11/11/2023   GLUCOSE 103 (H) 11/11/2023   CHOL 273 (H) 06/07/2023   TRIG 210.0 (H) 06/07/2023   HDL 69.60 06/07/2023   LDLDIRECT 154.0 12/14/2022   LDLCALC 161 (H) 06/07/2023   ALT 16 06/07/2023   AST 20 06/07/2023   NA 138 11/11/2023   K 3.8 11/11/2023   CL 107 11/11/2023   CREATININE 0.61 11/11/2023   BUN 10 11/11/2023   CO2 22 11/11/2023   TSH 0.37 06/07/2023   INR 1.0 12/08/2021   HGBA1C 6.1 06/07/2023         Assessment & Plan:   Physical exam: Screening blood work  ordered Exercise  walking, riding bike at gym Weight  is ok Substance abuse  none   Reviewed recommended immunizations.   Health Maintenance  Topic Date Due   COVID-19 Vaccine (6 - 2024-25 season) 12/22/2023 (Originally 01/16/2023)   Medicare Annual Wellness (AWV)  08/22/2024   DEXA SCAN  10/20/2024   MAMMOGRAM  02/27/2025   DTaP/Tdap/Td (3 - Td or Tdap) 02/16/2031   Pneumococcal Vaccine: 50+ Years  Completed   Hepatitis C Screening  Completed   Zoster Vaccines- Shingrix  Completed   Hepatitis B Vaccines  Aged Out   HPV VACCINES  Aged Out   Meningococcal B Vaccine  Aged Out   INFLUENZA VACCINE  Discontinued   Colonoscopy  Discontinued          See Problem List for Assessment and Plan of chronic medical problems.

## 2023-12-06 ENCOUNTER — Encounter: Payer: Self-pay | Admitting: Internal Medicine

## 2023-12-06 ENCOUNTER — Ambulatory Visit: Payer: Medicare Other | Admitting: Internal Medicine

## 2023-12-06 VITALS — BP 130/80 | HR 56 | Temp 98.0°F | Ht 62.5 in | Wt 149.0 lb

## 2023-12-06 DIAGNOSIS — Z0000 Encounter for general adult medical examination without abnormal findings: Secondary | ICD-10-CM | POA: Diagnosis not present

## 2023-12-06 DIAGNOSIS — I10 Essential (primary) hypertension: Secondary | ICD-10-CM

## 2023-12-06 DIAGNOSIS — E559 Vitamin D deficiency, unspecified: Secondary | ICD-10-CM

## 2023-12-06 DIAGNOSIS — K219 Gastro-esophageal reflux disease without esophagitis: Secondary | ICD-10-CM | POA: Diagnosis not present

## 2023-12-06 DIAGNOSIS — E7849 Other hyperlipidemia: Secondary | ICD-10-CM | POA: Diagnosis not present

## 2023-12-06 DIAGNOSIS — G720 Drug-induced myopathy: Secondary | ICD-10-CM | POA: Diagnosis not present

## 2023-12-06 DIAGNOSIS — Z8673 Personal history of transient ischemic attack (TIA), and cerebral infarction without residual deficits: Secondary | ICD-10-CM

## 2023-12-06 DIAGNOSIS — M85852 Other specified disorders of bone density and structure, left thigh: Secondary | ICD-10-CM | POA: Diagnosis not present

## 2023-12-06 DIAGNOSIS — T466X5A Adverse effect of antihyperlipidemic and antiarteriosclerotic drugs, initial encounter: Secondary | ICD-10-CM

## 2023-12-06 DIAGNOSIS — R7303 Prediabetes: Secondary | ICD-10-CM

## 2023-12-06 LAB — LIPID PANEL
Cholesterol: 168 mg/dL (ref 0–200)
HDL: 72.2 mg/dL (ref >39.00)
LDL Cholesterol: 65 mg/dL (ref 0–99)
NonHDL: 96.09
Total CHOL/HDL Ratio: 2
Triglycerides: 156 mg/dL — ABNORMAL HIGH (ref 0.0–149.0)
VLDL: 31.2 mg/dL (ref 0.0–40.0)

## 2023-12-06 LAB — COMPREHENSIVE METABOLIC PANEL WITH GFR
ALT: 15 U/L (ref 0–35)
AST: 21 U/L (ref 0–37)
Albumin: 4.1 g/dL (ref 3.5–5.2)
Alkaline Phosphatase: 83 U/L (ref 39–117)
BUN: 14 mg/dL (ref 6–23)
CO2: 31 meq/L (ref 19–32)
Calcium: 10.7 mg/dL — ABNORMAL HIGH (ref 8.4–10.5)
Chloride: 102 meq/L (ref 96–112)
Creatinine, Ser: 0.77 mg/dL (ref 0.40–1.20)
GFR: 77.82 mL/min (ref >60.00)
Glucose, Bld: 86 mg/dL (ref 70–99)
Potassium: 4.5 meq/L (ref 3.5–5.1)
Sodium: 139 meq/L (ref 135–145)
Total Bilirubin: 0.5 mg/dL (ref 0.2–1.2)
Total Protein: 7.4 g/dL (ref 6.0–8.3)

## 2023-12-06 LAB — CBC WITH DIFFERENTIAL/PLATELET
Basophils Absolute: 0 K/uL (ref 0.0–0.1)
Basophils Relative: 0.6 % (ref 0.0–3.0)
Eosinophils Absolute: 0.2 K/uL (ref 0.0–0.7)
Eosinophils Relative: 2.2 % (ref 0.0–5.0)
HCT: 42 % (ref 36.0–46.0)
Hemoglobin: 13.4 g/dL (ref 12.0–15.0)
Lymphocytes Relative: 24.6 % (ref 12.0–46.0)
Lymphs Abs: 1.8 K/uL (ref 0.7–4.0)
MCHC: 31.9 g/dL (ref 30.0–36.0)
MCV: 81.7 fl (ref 78.0–100.0)
Monocytes Absolute: 0.7 K/uL (ref 0.1–1.0)
Monocytes Relative: 9.1 % (ref 3.0–12.0)
Neutro Abs: 4.8 K/uL (ref 1.4–7.7)
Neutrophils Relative %: 63.5 % (ref 43.0–77.0)
Platelets: 241 K/uL (ref 150.0–400.0)
RBC: 5.14 Mil/uL — ABNORMAL HIGH (ref 3.87–5.11)
RDW: 13.7 % (ref 11.5–15.5)
WBC: 7.5 K/uL (ref 4.0–10.5)

## 2023-12-06 LAB — VITAMIN D 25 HYDROXY (VIT D DEFICIENCY, FRACTURES): VITD: 47.81 ng/mL (ref 30.00–100.00)

## 2023-12-06 LAB — HEMOGLOBIN A1C: Hgb A1c MFr Bld: 6.1 % (ref 4.6–6.5)

## 2023-12-06 LAB — TSH: TSH: 0.42 u[IU]/mL (ref 0.35–5.50)

## 2023-12-06 NOTE — Assessment & Plan Note (Signed)
 Chronic Had an episode of chest pain last month -> ED - likely GERD Continue pepcid  20 mg bid, prn

## 2023-12-06 NOTE — Assessment & Plan Note (Addendum)
 Possible TIA in the past Stressed taking ASA 81 mg daily and repatha 

## 2023-12-06 NOTE — Assessment & Plan Note (Signed)
 Chronic Has not tolerated crestor , pravastatin , atorvastatin  and zetia  Check lipids  On repatha 

## 2023-12-06 NOTE — Assessment & Plan Note (Signed)
 Chronic Lab Results  Component Value Date   HGBA1C 6.1 06/07/2023   Check a1c Low sugar / carb diet Stressed regular exercise

## 2023-12-06 NOTE — Assessment & Plan Note (Signed)
 Chronic Has not tolerated several statins or Zetia  On repatha 

## 2023-12-06 NOTE — Assessment & Plan Note (Signed)
 Chronic Taking MVI Continue exercise dexa up to date

## 2023-12-06 NOTE — Assessment & Plan Note (Signed)
 Chronic Blood pressure borderline controlled Monitor BP at home Cbc, cmp Continue atenolol  25 mg daily, telmisartan  80 mg daily

## 2023-12-06 NOTE — Assessment & Plan Note (Signed)
Chronic Taking vitamin d Check level 

## 2023-12-07 ENCOUNTER — Ambulatory Visit: Admitting: Internal Medicine

## 2023-12-08 ENCOUNTER — Ambulatory Visit: Payer: Self-pay | Admitting: Internal Medicine

## 2023-12-15 ENCOUNTER — Telehealth: Payer: Self-pay | Admitting: Internal Medicine

## 2023-12-15 NOTE — Telephone Encounter (Signed)
 Pt c/o medication issue:  1. Name of Medication:   Evolocumab  (REPATHA  SURECLICK) 140 MG/ML SOAJ    2. How are you currently taking this medication (dosage and times per day)?   Inject 140 mg into the skin every 14 (fourteen) days.    3. Are you having a reaction (difficulty breathing--STAT)? No  4. What is your medication issue? Pt states that when injecting medication, not all of it went in. She would like a c/b regarding this matter. Please advise

## 2023-12-15 NOTE — Telephone Encounter (Signed)
 Called patient and patient verbalized Repatha  injection taken yesterday and some of the injection went  did not go into abdomen and the rest went on her clothes.Made patient aware that this would be sent for advice to our pharmacy team. Per Medford Bolk, pharmacist advised patient to take next injection of Repatha  in 14 days. Patient made aware and verbalized understanding.

## 2023-12-30 ENCOUNTER — Ambulatory Visit: Admitting: Physician Assistant

## 2024-01-17 ENCOUNTER — Encounter: Payer: Self-pay | Admitting: Emergency Medicine

## 2024-01-17 ENCOUNTER — Ambulatory Visit: Attending: Emergency Medicine | Admitting: Emergency Medicine

## 2024-01-17 VITALS — BP 114/78 | HR 60 | Ht 63.0 in | Wt 149.0 lb

## 2024-01-17 DIAGNOSIS — I10 Essential (primary) hypertension: Secondary | ICD-10-CM | POA: Diagnosis not present

## 2024-01-17 DIAGNOSIS — R079 Chest pain, unspecified: Secondary | ICD-10-CM

## 2024-01-17 DIAGNOSIS — E785 Hyperlipidemia, unspecified: Secondary | ICD-10-CM

## 2024-01-17 NOTE — Progress Notes (Signed)
 Cardiology Office Note:    Date:  01/17/2024  ID:  Karen Evans 23-Jul-1950, MRN [id number] PCP: Karen Glade PARAS, MD  [facility] Providers Cardiologist:  Vinie JAYSON Maxcy, MD       Patient Profile:       Chief Complaint: Follow-up ED visit History of Present Illness:  Karen Evans is a 73 y.o. female with visit-pertinent history of hyperlipidemia, prediabetes, hypertension  Patient established with cardiology service on 06/02/2021 further evaluation dyslipidemia.  She had a longstanding history of elevated cholesterol but unfortunately has been intolerant to statins.  She had had allergic symptoms, primarily hives.  Despite having hives with both atorvastatin , rosuvastatin  and pravastatin  she had been able to tolerate low-dose rosuvastatin  5 mg 3 times a week.  She also had an intolerance to Zetia  which caused drowsiness.  It was noted that in 2023 she had a possible TIA.  She had carotid Dopplers which showed bilateral carotid artery disease at 1-39% stenosis.  Echocardiogram 11/2021 showed LVEF 55 to 60%, no RWMA, mild LVH, grade 1 DD, RV function normal, no valvular abnormalities.  She also had a diagnosis of vascular dementia.  Unfortunately she did not tolerate rosuvastatin  anymore.  She was last seen in office on 07/27/2023.  She was doing well at the time.  No acute complaints.  She was started on Repatha .  Patient was seen in the ED on 11/11/2023 with complaints of chest pains.  Her troponins were normal.  X-rays were unremarkable.  On recheck she had no chest pain shortness of breath.  She has a follow-up with cardiology.  She was seen by GI on 11/25/2023 for chest pain attributed to GERD.  She was started on Pepcid  20 mg once daily.   Discussed the use of AI scribe software for clinical note transcription with the patient, who gave verbal consent to proceed.  History of Present Illness Karen Evans is a 73 year old female who presents with chest  pain.  Chest pain occurs primarily at night when lying flat and after consuming sodas or acidic juices. There is no chest pain with physical activity.  The chest pain is described as burning feeling that radiates to her throat.  The chest pain has been intermittent over the past several months. Pepcid  20 mg taken twice daily as needed is effective for stomach pain.  Despite this, she sometimes consumes sodas, which triggers her symptoms.  Patient denies orthopnea, PND, leg swelling, syncope, presyncope, lightheadedness, dizziness, nausea, hematochezia, melena.    Review of systems:  Please see the history of present illness. All other systems are reviewed and otherwise negative.      Studies Reviewed:        Echocardiogram 12/09/2021 1. Left ventricular ejection fraction, by estimation, is 55 to 60%. The  left ventricle has normal function. The left ventricle has no regional  wall motion abnormalities. There is mild left ventricular hypertrophy.  Left ventricular diastolic parameters  are consistent with Grade I diastolic dysfunction (impaired relaxation).   2. Right ventricular systolic function is normal. The right ventricular  size is normal. Tricuspid regurgitation signal is inadequate for assessing  PA pressure.   3. The mitral valve is normal in structure. No evidence of mitral valve  regurgitation. No evidence of mitral stenosis.   4. The aortic valve is grossly normal. There is mild calcification of the  aortic valve. Aortic valve regurgitation is trivial. No aortic stenosis is  present.   5. The  inferior vena cava is normal in size with greater than 50%  respiratory variability, suggesting right atrial pressure of 3 mmHg.   Carotid duplex 12/09/2021 Right Carotid: Velocities in the right ICA are consistent with a 1-39%  stenosis.   Left Carotid: Velocities in the left ICA are consistent with a 1-39%  stenosis.   Vertebrals: Bilateral vertebral arteries demonstrate  antegrade flow.   Risk Assessment/Calculations:              Physical Exam:   VS:  BP 114/78 (BP Location: Left Arm, Patient Position: Sitting, Cuff Size: Normal)   Pulse 60   Ht 5' 3 (1.6 m)   Wt 149 lb (67.6 kg)   BMI 26.39 kg/m    Wt Readings from Last 3 Encounters:  01/17/24 149 lb (67.6 kg)  12/06/23 149 lb (67.6 kg)  11/28/23 149 lb (67.6 kg)    GEN: Well nourished, well developed in no acute distress NECK: No JVD; No carotid bruits CARDIAC: RRR, no murmurs, rubs, gallops RESPIRATORY:  Clear to auscultation without rales, wheezing or rhonchi  ABDOMEN: Soft, non-tender, non-distended EXTREMITIES:  No edema; No acute deformity      Assessment and Plan:  Chest pain attributed to GERD She reports chest pains that occur primarily at night when lying flat or after consuming sodas or juices.  The chest pain is described as a burning feeling that radiates to her throat.  This has been intermittent occurring 1-2 times a month over the past several months.  She denies any exertional chest pains.  Chest pains are resolved after taking Pepcid  - She is currently taking her Pepcid  as needed.  I have encouraged her to begin taking her Pepcid  20 mg twice daily - Lifestyle and diet modifications recommended, avoid eating 2 hours before bedtime - She is without anginal symptoms, no indication for further ischemic evaluation at this time  Hyperlipidemia, LDL goal <70 LDL 65 on 11/2023 and well-controlled - Continue Repatha  140 mg q. 14 days  Hypertension Blood pressure today is 114/78 well-controlled - Continue atenolol  25 mg daily and telmisartan  80 mg daily      Dispo:  Return in about 1 year (around 01/16/2025).  Signed, Lum LITTIE Louis, NP

## 2024-01-17 NOTE — Patient Instructions (Signed)
 Medication Instructions:  NO CHANGES  Lab Work: NONE TO BE DONE TODAY.  Testing/Procedures: NONE  Follow-Up: At [facility], you and your health needs are our priority.  As part of our continuing mission to provide you with exceptional heart care, our providers are all part of one team.  This team includes your primary Cardiologist (physician) and Advanced Practice Providers or APPs (Physician Assistants and Nurse Practitioners) who all work together to provide you with the care you need, when you need it.  Your next appointment:   1 year  Provider:   Lum Louis, NP

## 2024-02-02 ENCOUNTER — Ambulatory Visit: Admitting: Cardiology

## 2024-02-10 ENCOUNTER — Other Ambulatory Visit: Payer: Self-pay | Admitting: Internal Medicine

## 2024-02-10 DIAGNOSIS — Z1231 Encounter for screening mammogram for malignant neoplasm of breast: Secondary | ICD-10-CM

## 2024-02-16 ENCOUNTER — Telehealth: Payer: Self-pay | Admitting: Pharmacy Technician

## 2024-02-16 NOTE — Telephone Encounter (Signed)
   Pharmacy Patient Advocate Encounter   Received notification from CoverMyMeds that prior authorization for repatha  is required/requested.   Insurance verification completed.   The patient is insured through [employer].   Per test claim: PA required; PA submitted to above mentioned insurance via Latent Key/confirmation #/EOC [id number] Status is pending

## 2024-02-17 ENCOUNTER — Ambulatory Visit

## 2024-02-17 NOTE — Telephone Encounter (Signed)
 Pharmacy Patient Advocate Encounter  Received notification from [facility] that Prior Authorization for repatha  has been APPROVED from 02/17/24 to 05/16/25   PA #/Case ID/Reference #: [id number]

## 2024-02-21 ENCOUNTER — Ambulatory Visit: Admitting: Podiatry

## 2024-02-28 ENCOUNTER — Ambulatory Visit

## 2024-03-05 ENCOUNTER — Ambulatory Visit
Admission: RE | Admit: 2024-03-05 | Discharge: 2024-03-05 | Disposition: A | Discharge status: Home / Self Care | Source: Ambulatory Visit | Attending: Internal Medicine | Admitting: Internal Medicine

## 2024-03-05 DIAGNOSIS — Z1231 Encounter for screening mammogram for malignant neoplasm of breast: Secondary | ICD-10-CM

## 2024-03-20 ENCOUNTER — Encounter: Payer: Self-pay | Admitting: Podiatry

## 2024-03-20 ENCOUNTER — Ambulatory Visit: Admitting: Podiatry

## 2024-03-20 DIAGNOSIS — M79674 Pain in right toe(s): Secondary | ICD-10-CM

## 2024-03-20 DIAGNOSIS — M79675 Pain in left toe(s): Secondary | ICD-10-CM

## 2024-03-20 DIAGNOSIS — B351 Tinea unguium: Secondary | ICD-10-CM

## 2024-03-20 NOTE — Progress Notes (Signed)

## 2024-04-04 NOTE — Progress Notes (Signed)
 Assessment/Plan:    Mild cognitive impairment*** Memory impairment***  Karen Evans is a very pleasant 73 y.o. RH female with a history ofHTN, HLD, Prediabetes, presenting today in follow-up for evaluation of memory loss. Patient is not on antidementia medications, was unable to tolerate donepezil  in the past due to vivid dreams preferring to wait to initiate any other agent to when or if memory were to worsen.  Memory stable, MMSE today is***.  She is able to participate in her ADLs without difficulty, continues to drive without any issues and her mood is good.  She tries to remain active***.     Recommendations:   Follow up in   months. No indication for antidementia medication at this time Recommend good control of cardiovascular risk factors Continue to control mood as per PCP    Subjective:   This patient is accompanied in the office by ***  who supplements the history. Previous records as well as any outside records available were reviewed prior to todays visit.   Patient was last seen on 06/14/2023 with MMSE 29/30  ***.    Any changes in memory since last visit? .  As before, she may have some difficulty with short-term memory including recent conversations and names of people that they are not familiar to her.  She likes watching game shows drawing, painting, tending Silver sneakers.  She enjoys traveling to see her mother. repeats oneself?  Endorsed Disoriented when walking into a room?  Patient denies ***  Misplacing objects?  Patient denies   Wandering behavior?   Denies. Any personality changes since last visit? Denies.   Any worsening depression?: denies.   Hallucinations or paranoia?  Denies.   Seizures?   Denies.    Any sleep changes? Sleeps well***. Does not sleep very well***.   Denies vivid dreams, REM behavior or sleepwalking   Sleep apnea?   denies ***  Any hygiene concerns?   Denies.   Independent of bathing and dressing?  Endorsed  Does the patient  needs help with medications? Patient is in charge *** Who is in charge of the finances?  Patient is in charge   *** Any changes in appetite?  denies ***   Patient have trouble swallowing?  Denies.   Does the patient cook?  Any kitchen accidents such as leaving the stove on?   Denies.   Any headaches?    Denies.   Vision changes? Denies. Chronic pain?  Denies.   Ambulates with difficulty?    Denies. ***  Recent falls or head injuries?    Denies.      Unilateral weakness, numbness or tingling?  Denies.   Any tremors?  Denies.   Any anosmia?    Denies.   Any incontinence of urine?  Denies.   Any bowel dysfunction?  Denies.      Patient lives alone.*** Does the patient drive?  Yes, denies any lost   History on Initial Assessment 06/07/2018: This is a 73 year old right-handed woman with a history of hypertension, hyperlipidemia, presenting for evaluation of memory concerns. She reports that she started noticing memory changes when they started letting people go at the plant in 2016. She noticed she was forgetting things. She reported symptoms to Dr. Geofm in November 2019 where she would leave food out without putting it away, one time she left ice cream container out after eating it, and left a doggie bag or groceries in her car. She has burnt food she forgot was cooking  and now has to make herself stay in the kitchen. She lost her keys one time and has started putting them in one place. She denies getting lost driving. No missed bills or medications. 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 She lives alone, her family (mother and brother) live in [place] . She has told them of her memory concerns, they don't think I need to come here. Her maternal aunt had memory issues. No history of concussions or alcohol use.   She has occasional right hand and left finger tingling. She denies any headaches, dizziness, diplopia,  dysarthria/dysphagia, neck/back pain, bowel/bladder dysfunction, anosmia, or tremors. No falls. Sleep is good. Mood I think it's good. There is no family to corroborate history but there does not appear to be any paranoia or hallucinations.    Diagnostic Data: MRI brain without contrast done 07/2018 which did not show any acute changes. There was moderately advanced chronic microvascular disease.   Past Medical History:  Diagnosis Date   Allergy    Anemia    PMH of    Colon polyp    Crohn's    ileitis/mild; Dr Jakie   Duodenitis without mention of hemorrhage 2007   EGD   Fibroid, uterine    Dr Rutherford; G 0 P 0   GERD (gastroesophageal reflux disease)    Goiter    CTS,RUE > LUE   Heart murmur    Hiatal hernia 2002   EGD   Hypertension    Internal hemorrhoids    Migraines    when takes vitamins    Rectal polyp 01/24/2012   TUBULAR ADENOMA   Reflux esophagitis 2007   EGD   Stricture and stenosis of esophagus 2007   EGD   Tubular adenoma of colon    Ulcer    Vitamin D  deficiency      Past Surgical History:  Procedure Laterality Date   CATARACT EXTRACTION Bilateral    2024   COLONOSCOPY     polyps;granular ileitis 2006;2007 normal, Dr Jakie   COLONOSCOPY WITH PROPOFOL   07/2016   Dr.Pyrtle   HEMORRHOID SURGERY     OPERATIVE HYSTEROSCOPY  08/05/2004   Dr Rutherford   OVARIAN CYST REMOVAL     POLYPECTOMY     UPPER GASTROINTESTINAL ENDOSCOPY       PREVIOUS MEDICATIONS:   CURRENT MEDICATIONS:  Outpatient Encounter Medications as of 04/05/2024  Medication Sig   acetaminophen  (TYLENOL ) 500 MG tablet Take 250-500 mg by mouth every 6 (six) hours as needed for headache (pain).   aspirin  EC 81 MG tablet Take 1 tablet (81 mg total) by mouth daily. Swallow whole.   atenolol  (TENORMIN ) 25 MG tablet TAKE 1 TABLET BY MOUTH DAILY   betamethasone  valerate ointment (VALISONE ) 0.1 % Apply a pea sized amount BID for up to 2 weeks as needed   Calcium  Carbonate-Vitamin D   (CALTRATE 600+D PO) Take 1 tablet by mouth daily.    cholecalciferol (VITAMIN D ) 1000 units tablet Take 1,000 Units by mouth daily.   Evolocumab  (REPATHA  SURECLICK) 140 MG/ML SOAJ Inject 140 mg into the skin every 14 (fourteen) days.   famotidine  (PEPCID ) 20 MG tablet Take 1 tablet (20 mg total) by mouth 2 (two) times daily.   fexofenadine (ALLEGRA) 180 MG tablet Take 180 mg by mouth daily.   Fluticasone  Propionate (FLONASE  NA) Place 1 spray into the nose daily as needed (congestion).   hydrocortisone  (ANUSOL -HC) 25 MG  suppository Place 25 mg rectally as needed for hemorrhoids or anal itching.   ketorolac  (ACULAR ) 0.5 % ophthalmic solution Place 1 drop into the left eye 4 (four) times daily.   Lactase (LACTAID PO) Take 1 tablet by mouth 2 (two) times daily as needed (stomach upset/ gas).    Multiple Vitamin (MULTIVITAMIN WITH MINERALS) TABS tablet Take 1 tablet by mouth daily.   olopatadine (PATANOL) 0.1 % ophthalmic solution Place 1 drop into both eyes 1 day or 1 dose. Dr. Milissa   Propylene Glycol (SYSTANE COMPLETE OP) Place 1 drop into both eyes at bedtime.   telmisartan  (MICARDIS ) 80 MG tablet TAKE 1 TABLET BY MOUTH DAILY   No facility-administered encounter medications on file as of 04/05/2024.     Objective:     PHYSICAL EXAMINATION:    VITALS:  There were no vitals filed for this visit.  GEN:  The patient appears stated age and is in NAD. HEENT:  Normocephalic, atraumatic.   Neurological examination:  General: NAD, well-groomed, appears stated age. Orientation: The patient is alert. Oriented to person, place and not to date.*** Cranial nerves: There is good facial symmetry.The speech is fluent and clear. No aphasia or dysarthria. Fund of knowledge is appropriate. Recent memory impaired and remote memory is normal.  Attention and concentration are normal.  Able to name objects and repeat phrases.  Hearing is intact to conversational tone ***.   Delayed recall *** Sensation:  Sensation is intact to light touch throughout Motor: Strength is at least antigravity x4. DTR's 2/4 in UE/LE      08/13/2019    2:00 PM 12/20/2018    3:00 PM 06/07/2018    9:00 AM  Montreal Cognitive Assessment   Visuospatial/ Executive (0/5) 2 4 4   Naming (0/3) 3 3 3   Attention: Read list of digits (0/2) 0 1 2  Attention: Read list of letters (0/1) 0 1 1  Attention: Serial 7 subtraction starting at 100 (0/3) 0 1 0  Language: Repeat phrase (0/2) 0 2 2  Language : Fluency (0/1) 0 0 0  Abstraction (0/2) 1 2 0  Delayed Recall (0/5) 4 0 3  Orientation (0/6) 6 6 6   Total 16 20 21   Adjusted Score (based on education) 16         06/14/2023    7:00 PM 12/09/2022    4:00 PM 06/10/2022    2:00 PM  MMSE - Mini Mental State Exam  Orientation to time 4 5 5   Orientation to Place 5 5 5   Registration 3 3 3   Attention/ Calculation 5 5 4   Recall 3 3 3   Language- name 2 objects 2 2 2   Language- repeat 1 1 1   Language- follow 3 step command 3 3 3   Language- read & follow direction 1 1 1   Write a sentence 1 1 1   Copy design 1 1 1   Total score 29 30 29        Movement examination: Tone: There is normal tone in the UE/LE Abnormal movements:  no tremor.  No myoclonus.  No asterixis.   Coordination:  There is no decremation with RAM's. Normal finger to nose  Gait and Station: The patient has no difficulty arising out of a deep-seated chair without the use of the hands. The patient's stride length is good.  Gait is cautious and narrow.   Thank you for allowing us  the opportunity to participate in the care of this nice patient. Please do not hesitate to contact us  for  any questions or concerns.   Total time spent on today's visit was *** minutes dedicated to this patient today, preparing to see patient, examining the patient, ordering tests and/or medications and counseling the patient, documenting clinical information in the EHR or other health record, independently interpreting results and  communicating results to the patient/family, discussing treatment and goals, answering patient's questions and coordinating care.  Cc:  Geofm Glade PARAS, MD  Camie Sevin 04/04/2024 6:38 AM

## 2024-04-05 ENCOUNTER — Ambulatory Visit: Admitting: Physician Assistant

## 2024-04-05 ENCOUNTER — Encounter: Payer: Self-pay | Admitting: Physician Assistant

## 2024-04-05 VITALS — BP 137/73 | HR 60 | Resp 20 | Wt 149.0 lb

## 2024-04-05 DIAGNOSIS — I999 Unspecified disorder of circulatory system: Secondary | ICD-10-CM

## 2024-04-05 DIAGNOSIS — F0670 Mild neurocognitive disorder due to known physiological condition without behavioral disturbance: Secondary | ICD-10-CM

## 2024-04-05 NOTE — Patient Instructions (Signed)
 It was a pleasure to see you today at our office.   Recommendations:  Meds: Follow up in 6 months Continue to monitor your Blood pressure, continue aspirin daily  Continue to stay hydrated  Continue to eat healthy Continue brain excercises   RECOMMENDATIONS FOR ALL PATIENTS WITH MEMORY PROBLEMS: 1. Continue to exercise (Recommend 30 minutes of walking everyday, or 3 hours every week) 2. Increase social interactions - continue going to [place] and enjoy social gatherings with friends and family 3. Eat healthy, avoid fried foods and eat more fruits and vegetables 4. Maintain adequate blood pressure, blood sugar, and blood cholesterol level. Reducing the risk of stroke and cardiovascular disease also helps promoting better memory. 5. Avoid stressful situations. Live a simple life and avoid aggravations. Organize your time and prepare for the next day in anticipation. 6. Sleep well, avoid any interruptions of sleep and avoid any distractions in the bedroom that may interfere with adequate sleep quality 7. Avoid sugar, avoid sweets as there is a strong link between excessive sugar intake, diabetes, and cognitive impairment We discussed the Mediterranean diet, which has been shown to help patients reduce the risk of progressive memory disorders and reduces cardiovascular risk. This includes eating fish, eat fruits and green leafy vegetables, nuts like almonds and hazelnuts, walnuts, and also use olive oil. Avoid fast foods and fried foods as much as possible. Avoid sweets and sugar as sugar use has been linked to worsening of memory function.  There is always a concern of gradual progression of memory problems. If this is the case, then we may need to adjust level of care according to patient needs. Support, both to the patient and caregiver, should then be put into place.    FALL PRECAUTIONS: Be cautious when walking. Scan the area for obstacles that may increase the risk of trips and falls. When  getting up in the mornings, sit up at the edge of the bed for a few minutes before getting out of bed. Consider elevating the bed at the head end to avoid drop of blood pressure when getting up. Walk always in a well-lit room (use night lights in the walls). Avoid area rugs or power cords from appliances in the middle of the walkways. Use a walker or a cane if necessary and consider physical therapy for balance exercise. Get your eyesight checked regularly.   HOME SAFETY: Consider the safety of the kitchen when operating appliances like stoves, microwave oven, and blender. Consider having supervision and share cooking responsibilities until no longer able to participate in those. Accidents with firearms and other hazards in the house should be identified and addressed as well       Mediterranean Diet A Mediterranean diet refers to food and lifestyle choices that are based on the traditions of countries located on the [employer]. This way of eating has been shown to help prevent certain conditions and improve outcomes for people who have chronic diseases, like kidney disease and heart disease. What are tips for following this plan? Lifestyle  Cook and eat meals together with your family, when possible. Drink enough fluid to keep your urine clear or pale yellow. Be physically active every day. This includes: Aerobic exercise like running or swimming. Leisure activities like gardening, walking, or housework. Get 7-8 hours of sleep each night. If recommended by your health care provider, drink red wine in moderation. This means 1 glass a day for nonpregnant women and 2 glasses a day for men. A glass of  wine equals 5 oz (150 mL). Reading food labels  Check the serving size of packaged foods. For foods such as rice and pasta, the serving size refers to the amount of cooked product, not dry. Check the total fat in packaged foods. Avoid foods that have saturated fat or trans fats. Check the  ingredients list for added sugars, such as corn syrup. Shopping  At the grocery store, buy most of your food from the areas near the walls of the store. This includes: Fresh fruits and vegetables (produce). Grains, beans, nuts, and seeds. Some of these may be available in unpackaged forms or large amounts (in bulk). Fresh seafood. Poultry and eggs. Low-fat dairy products. Buy whole ingredients instead of prepackaged foods. Buy fresh fruits and vegetables in-season from local farmers markets. Buy frozen fruits and vegetables in resealable bags. If you do not have access to quality fresh seafood, buy precooked frozen shrimp or canned fish, such as tuna, salmon, or sardines. Buy small amounts of raw or cooked vegetables, salads, or olives from the deli or salad bar at your store. Stock your pantry so you always have certain foods on hand, such as olive oil, canned tuna, canned tomatoes, rice, pasta, and beans. Cooking  Cook foods with extra-virgin olive oil instead of using butter or other vegetable oils. Have meat as a side dish, and have vegetables or grains as your main dish. This means having meat in small portions or adding small amounts of meat to foods like pasta or stew. Use beans or vegetables instead of meat in common dishes like chili or lasagna. Experiment with different cooking methods. Try roasting or broiling vegetables instead of steaming or sauteing them. Add frozen vegetables to soups, stews, pasta, or rice. Add nuts or seeds for added healthy fat at each meal. You can add these to yogurt, salads, or vegetable dishes. Marinate fish or vegetables using olive oil, lemon juice, garlic, and fresh herbs. Meal planning  Plan to eat 1 vegetarian meal one day each week. Try to work up to 2 vegetarian meals, if possible. Eat seafood 2 or more times a week. Have healthy snacks readily available, such as: Vegetable sticks with hummus. Greek yogurt. Fruit and nut trail mix. Eat  balanced meals throughout the week. This includes: Fruit: 2-3 servings a day Vegetables: 4-5 servings a day Low-fat dairy: 2 servings a day Fish, poultry, or lean meat: 1 serving a day Beans and legumes: 2 or more servings a week Nuts and seeds: 1-2 servings a day Whole grains: 6-8 servings a day Extra-virgin olive oil: 3-4 servings a day Limit red meat and sweets to only a few servings a month What are my food choices? Mediterranean diet Recommended Grains: Whole-grain pasta. Brown rice. Bulgar wheat. Polenta. Couscous. Whole-wheat bread. Orpah Cobb. Vegetables: Artichokes. Beets. Broccoli. Cabbage. Carrots. Eggplant. Green beans. Chard. Kale. Spinach. Onions. Leeks. Peas. Squash. Tomatoes. Peppers. Radishes. Fruits: Apples. Apricots. Avocado. Berries. Bananas. Cherries. Dates. Figs. Grapes. Lemons. Melon. Oranges. Peaches. Plums. Pomegranate. Meats and other protein foods: Beans. Almonds. Sunflower seeds. Pine nuts. Peanuts. Cod. Salmon. Scallops. Shrimp. Tuna. Tilapia. Clams. Oysters. Eggs. Dairy: Low-fat milk. Cheese. Greek yogurt. Beverages: Water. Red wine. Herbal tea. Fats and oils: Extra virgin olive oil. Avocado oil. Grape seed oil. Sweets and desserts: [place] yogurt with honey. Baked apples. Poached pears. Trail mix. Seasoning and other foods: Basil. Cilantro. Coriander. Cumin. Mint. Parsley. Sage. Rosemary. Tarragon. Garlic. Oregano. Thyme. Pepper. Balsalmic vinegar. Tahini. Hummus. Tomato sauce. Olives. Mushrooms. Limit these Grains: Prepackaged pasta  or rice dishes. Prepackaged cereal with added sugar. Vegetables: Deep fried potatoes (french fries). Fruits: Fruit canned in syrup. Meats and other protein foods: Beef. Pork. Lamb. Poultry with skin. Hot dogs. Tomasa Blase. Dairy: Ice cream. Sour cream. Whole milk. Beverages: Juice. Sugar-sweetened soft drinks. Beer. Liquor and spirits. Fats and oils: Butter. Canola oil. Vegetable oil. Beef fat (tallow). Lard. Sweets and desserts:  Cookies. Cakes. Pies. Candy. Seasoning and other foods: Mayonnaise. Premade sauces and marinades. The items listed may not be a complete list. Talk with your dietitian about what dietary choices are right for you. Summary The Mediterranean diet includes both food and lifestyle choices. Eat a variety of fresh fruits and vegetables, beans, nuts, seeds, and whole grains. Limit the amount of red meat and sweets that you eat. Talk with your health care provider about whether it is safe for you to drink red wine in moderation. This means 1 glass a day for nonpregnant women and 2 glasses a day for men. A glass of wine equals 5 oz (150 mL). This information is not intended to replace advice given to you by your health care provider. Make sure you discuss any questions you have with your health care provider. Document Released: 12/25/2015 Document Revised: 01/27/2016 Document Reviewed: 12/25/2015 Elsevier Interactive Patient Education  2017 [employer].

## 2024-05-02 ENCOUNTER — Other Ambulatory Visit: Payer: Self-pay | Admitting: Internal Medicine

## 2024-05-24 ENCOUNTER — Telehealth: Payer: Self-pay | Admitting: Internal Medicine

## 2024-05-24 MED ORDER — REPATHA SURECLICK 140 MG/ML ~~LOC~~ SOAJ: 140.0000 mg | SUBCUTANEOUS | 0 refills | Status: AC

## 2024-05-24 NOTE — Telephone Encounter (Signed)
 Error

## 2024-05-24 NOTE — Telephone Encounter (Signed)
 Pt c/o medication issue:  1. Name of Medication:   Evolocumab  (REPATHA  SURECLICK) 140 MG/ML SOAJ   2. How are you currently taking this medication (dosage and times per day)?   3. Are you having a reaction (difficulty breathing--STAT)?   4. What is your medication issue?   Caller Careers Information Officer) stated she will need a verbal approval to replace patient's injections that did not work.  Ref# [id number]

## 2024-05-24 NOTE — Addendum Note (Signed)
 Addended by: Zidan Helget L on: 05/24/2024 04:30 PM   Modules accepted: Orders

## 2024-05-24 NOTE — Telephone Encounter (Signed)
 Rx sent to [facility] for replacement pens.  Patient aware  Refrigerator was out for ~2 hours, assured her those pens were still fine to use.

## 2024-05-24 NOTE — Telephone Encounter (Signed)
 Pt calling back to say that  [facility] [phone] - [place], [place] - [address]   Has repatha  they can fill for her.

## 2024-05-24 NOTE — Telephone Encounter (Signed)
 She would also like to know if she should send back the two that were in the fridge when it went out. Please advise

## 2024-06-07 NOTE — Progress Notes (Unsigned)
 "     Subjective:    Patient ID: Karen Evans, female    DOB: August 12, 1950, 74 y.o.   MRN: [id number]     HPI Karen Evans is here for follow up of her chronic medical problems.  Eating pretty well.  She is exercising a little [place] [facility] [place] [employer]    No concern.     Medications and allergies reviewed with patient and updated if appropriate.  Medications Ordered Prior to Encounter[1]   Review of Systems  Constitutional:  Negative for fever.  HENT:  Positive for sneezing.   Respiratory:  Negative for cough, shortness of breath and wheezing.   Cardiovascular:  Negative for chest pain, palpitations and leg swelling.  Neurological:  Positive for light-headedness (occ). Negative for headaches.       Objective:   Vitals:   06/08/24 1454  BP: 126/72  Pulse: (!) 51  Temp: 98.1 F (36.7 C)  SpO2: 96%   BP Readings from Last 3 Encounters:  06/08/24 126/72  04/05/24 137/73  01/17/24 114/78   Wt Readings from Last 3 Encounters:  06/08/24 146 lb (66.2 kg)  04/05/24 149 lb (67.6 kg)  01/17/24 149 lb (67.6 kg)   Body mass index is 25.86 kg/m.    Physical Exam Constitutional:      General: She is not in acute distress.    Appearance: Normal appearance.  HENT:     Head: Normocephalic and atraumatic.  Eyes:     Conjunctiva/sclera: Conjunctivae normal.  Cardiovascular:     Rate and Rhythm: Normal rate and regular rhythm.     Heart sounds: Normal heart sounds.  Pulmonary:     Effort: Pulmonary effort is normal. No respiratory distress.     Breath sounds: Normal breath sounds. No wheezing.  Musculoskeletal:     Cervical back: Neck supple.     Right lower leg: No edema.     Left lower leg: No edema.  Lymphadenopathy:     Cervical: No cervical adenopathy.  Skin:    General: Skin is warm and dry.     Findings: No rash.  Neurological:     Mental Status: She is alert. Mental status is at baseline.  Psychiatric:        Mood and Affect: Mood normal.        Behavior:  Behavior normal.        Lab Results  Component Value Date   WBC 7.5 12/06/2023   HGB 13.4 12/06/2023   HCT 42.0 12/06/2023   PLT 241.0 12/06/2023   GLUCOSE 86 12/06/2023   CHOL 168 12/06/2023   TRIG 156.0 (H) 12/06/2023   HDL 72.20 12/06/2023   LDLDIRECT 154.0 12/14/2022   LDLCALC 65 12/06/2023   ALT 15 12/06/2023   AST 21 12/06/2023   NA 139 12/06/2023   K 4.5 12/06/2023   CL 102 12/06/2023   CREATININE 0.77 12/06/2023   BUN 14 12/06/2023   CO2 31 12/06/2023   TSH 0.42 12/06/2023   INR 1.0 12/08/2021   HGBA1C 6.1 12/06/2023     Assessment & Plan:    See Problem List for Assessment and Plan of chronic medical problems.       [1]  Current Outpatient Medications on File Prior to Visit  Medication Sig Dispense Refill   acetaminophen  (TYLENOL ) 500 MG tablet Take 250-500 mg by mouth every 6 (six) hours as needed for headache (pain).     aspirin  EC 81 MG tablet Take 1 tablet (81 mg total)  by mouth daily. Swallow whole. 30 tablet 12   atenolol  (TENORMIN ) 25 MG tablet TAKE 1 TABLET BY MOUTH DAILY 100 tablet 2   betamethasone  valerate ointment (VALISONE ) 0.1 % Apply a pea sized amount BID for up to 2 weeks as needed 30 g 0   Calcium  Carbonate-Vitamin D  (CALTRATE 600+D PO) Take 1 tablet by mouth daily.      cholecalciferol (VITAMIN D ) 1000 units tablet Take 1,000 Units by mouth daily.     Evolocumab  (REPATHA  SURECLICK) 140 MG/ML SOAJ Inject 140 mg into the skin every 14 (fourteen) days. 2 mL 0   famotidine  (PEPCID ) 20 MG tablet Take 1 tablet (20 mg total) by mouth 2 (two) times daily. 180 tablet 3   fexofenadine (ALLEGRA) 180 MG tablet Take 180 mg by mouth daily.     Fluticasone  Propionate (FLONASE  NA) Place 1 spray into the nose daily as needed (congestion).     hydrocortisone  (ANUSOL -HC) 25 MG suppository Place 25 mg rectally as needed for hemorrhoids or anal itching.     ketorolac  (ACULAR ) 0.5 % ophthalmic solution Place 1 drop into the left eye 4 (four) times daily.      Lactase (LACTAID PO) Take 1 tablet by mouth 2 (two) times daily as needed (stomach upset/ gas).      Multiple Vitamin (MULTIVITAMIN WITH MINERALS) TABS tablet Take 1 tablet by mouth daily.     olopatadine (PATANOL) 0.1 % ophthalmic solution Place 1 drop into both eyes 1 day or 1 dose. Dr. Milissa     Propylene Glycol (SYSTANE COMPLETE OP) Place 1 drop into both eyes at bedtime.     telmisartan  (MICARDIS ) 80 MG tablet TAKE 1 TABLET BY MOUTH DAILY 100 tablet 2   No current facility-administered medications on file prior to visit.   "

## 2024-06-07 NOTE — Patient Instructions (Addendum)
" ° ° ° ° °  Blood work was ordered.       Medications changes include :   None    A referral was ordered and someone will call you to schedule an appointment.     Return in about 6 months (around 12/06/2024) for Physical Exam.  "

## 2024-06-08 ENCOUNTER — Encounter: Payer: Self-pay | Admitting: Internal Medicine

## 2024-06-08 ENCOUNTER — Ambulatory Visit: Admitting: Internal Medicine

## 2024-06-08 VITALS — BP 126/72 | HR 51 | Temp 98.1°F | Ht 63.0 in | Wt 146.0 lb

## 2024-06-08 DIAGNOSIS — G720 Drug-induced myopathy: Secondary | ICD-10-CM | POA: Diagnosis not present

## 2024-06-08 DIAGNOSIS — E559 Vitamin D deficiency, unspecified: Secondary | ICD-10-CM

## 2024-06-08 DIAGNOSIS — I10 Essential (primary) hypertension: Secondary | ICD-10-CM

## 2024-06-08 DIAGNOSIS — E7800 Pure hypercholesterolemia, unspecified: Secondary | ICD-10-CM

## 2024-06-08 DIAGNOSIS — T466X5A Adverse effect of antihyperlipidemic and antiarteriosclerotic drugs, initial encounter: Secondary | ICD-10-CM | POA: Diagnosis not present

## 2024-06-08 DIAGNOSIS — R7303 Prediabetes: Secondary | ICD-10-CM | POA: Diagnosis not present

## 2024-06-08 DIAGNOSIS — F01A0 Vascular dementia, mild, without behavioral disturbance, psychotic disturbance, mood disturbance, and anxiety: Secondary | ICD-10-CM

## 2024-06-08 DIAGNOSIS — Z8673 Personal history of transient ischemic attack (TIA), and cerebral infarction without residual deficits: Secondary | ICD-10-CM | POA: Diagnosis not present

## 2024-06-08 LAB — COMPREHENSIVE METABOLIC PANEL WITH GFR
ALT: 13 U/L (ref 3–35)
AST: 18 U/L (ref 5–37)
Albumin: 4 g/dL (ref 3.5–5.2)
Alkaline Phosphatase: 76 U/L (ref 39–117)
BUN: 14 mg/dL (ref 6–23)
CO2: 30 meq/L (ref 19–32)
Calcium: 10.2 mg/dL (ref 8.4–10.5)
Chloride: 104 meq/L (ref 96–112)
Creatinine, Ser: 0.72 mg/dL (ref 0.40–1.20)
GFR: 84.05 mL/min
Glucose, Bld: 77 mg/dL (ref 70–99)
Potassium: 4.1 meq/L (ref 3.5–5.1)
Sodium: 140 meq/L (ref 135–145)
Total Bilirubin: 0.4 mg/dL (ref 0.2–1.2)
Total Protein: 7.3 g/dL (ref 6.0–8.3)

## 2024-06-08 LAB — LIPID PANEL
Cholesterol: 158 mg/dL (ref 28–200)
HDL: 67 mg/dL
LDL Cholesterol: 62 mg/dL (ref 10–99)
NonHDL: 90.79
Total CHOL/HDL Ratio: 2
Triglycerides: 146 mg/dL (ref 10.0–149.0)
VLDL: 29.2 mg/dL (ref 0.0–40.0)

## 2024-06-08 LAB — VITAMIN D 25 HYDROXY (VIT D DEFICIENCY, FRACTURES): VITD: 39 ng/mL (ref 30.00–100.00)

## 2024-06-08 LAB — HEMOGLOBIN A1C: Hgb A1c MFr Bld: 6 % (ref 4.6–6.5)

## 2024-06-08 NOTE — Assessment & Plan Note (Signed)
 Possible TIA in the past Stressed taking ASA 81 mg daily and repatha 

## 2024-06-08 NOTE — Assessment & Plan Note (Signed)
 Chronic Blood pressure borderline controlled Monitor BP at home cmp Continue atenolol  25 mg daily, telmisartan  80 mg daily

## 2024-06-08 NOTE — Assessment & Plan Note (Signed)
 Chronic Has not tolerated several statins or Zetia  On repatha 

## 2024-06-08 NOTE — Assessment & Plan Note (Signed)
Chronic Taking vitamin d Check level 

## 2024-06-08 NOTE — Assessment & Plan Note (Addendum)
 Chronic Following with neurology Not currently on any medication Encouraged regular exercise Continue repatha , asa 81 mg daily

## 2024-06-08 NOTE — Assessment & Plan Note (Signed)
 Chronic Has not tolerated crestor , pravastatin , atorvastatin  and zetia  Check lipids, cmp On repatha 

## 2024-06-08 NOTE — Assessment & Plan Note (Signed)
 Chronic Lab Results  Component Value Date   HGBA1C 6.1 12/06/2023   Check a1c Low sugar / carb diet Stressed regular exercise

## 2024-06-09 ENCOUNTER — Ambulatory Visit: Payer: Self-pay | Admitting: Internal Medicine

## 2024-07-18 ENCOUNTER — Ambulatory Visit: Admitting: Podiatry

## 2024-09-25 ENCOUNTER — Ambulatory Visit

## 2024-10-04 ENCOUNTER — Ambulatory Visit: Admitting: Physician Assistant

## 2024-11-28 ENCOUNTER — Encounter: Admitting: Obstetrics and Gynecology

## 2024-12-07 ENCOUNTER — Encounter: Admitting: Internal Medicine
